# Patient Record
Sex: Female | Born: 1955
Health system: Southern US, Community
[De-identification: ages and names within clinical notes are randomized; demographics above are authoritative.]

## PROBLEM LIST (undated history)

## (undated) DIAGNOSIS — R112 Nausea with vomiting, unspecified: Secondary | ICD-10-CM

## (undated) DIAGNOSIS — N189 Chronic kidney disease, unspecified: Secondary | ICD-10-CM

## (undated) DIAGNOSIS — Z9889 Other specified postprocedural states: Secondary | ICD-10-CM

## (undated) DIAGNOSIS — T8859XA Other complications of anesthesia, initial encounter: Secondary | ICD-10-CM

## (undated) DIAGNOSIS — R42 Dizziness and giddiness: Secondary | ICD-10-CM

## (undated) DIAGNOSIS — H9191 Unspecified hearing loss, right ear: Secondary | ICD-10-CM

## (undated) DIAGNOSIS — E7800 Pure hypercholesterolemia, unspecified: Secondary | ICD-10-CM

## (undated) DIAGNOSIS — Z8489 Family history of other specified conditions: Secondary | ICD-10-CM

## (undated) DIAGNOSIS — Z974 Presence of external hearing-aid: Secondary | ICD-10-CM

## (undated) DIAGNOSIS — T4145XA Adverse effect of unspecified anesthetic, initial encounter: Secondary | ICD-10-CM

## (undated) DIAGNOSIS — Z5189 Encounter for other specified aftercare: Secondary | ICD-10-CM

## (undated) DIAGNOSIS — K219 Gastro-esophageal reflux disease without esophagitis: Secondary | ICD-10-CM

## (undated) DIAGNOSIS — IMO0001 Reserved for inherently not codable concepts without codable children: Secondary | ICD-10-CM

## (undated) HISTORY — PX: COSMETIC SURGERY: SHX468

## (undated) HISTORY — PX: TUBAL LIGATION: SHX77

## (undated) HISTORY — PX: NECK SURGERY: SHX720

---
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
## 1998-06-27 ENCOUNTER — Encounter: Admission: RE | Admit: 1998-06-27 | Discharge: 1998-09-25 | Payer: Self-pay | Admitting: *Deleted

## 1999-04-06 ENCOUNTER — Ambulatory Visit (HOSPITAL_COMMUNITY): Admission: RE | Admit: 1999-04-06 | Discharge: 1999-04-06 | Payer: Self-pay | Admitting: *Deleted

## 1999-05-02 ENCOUNTER — Other Ambulatory Visit: Admission: RE | Admit: 1999-05-02 | Discharge: 1999-05-02 | Payer: Self-pay | Admitting: Obstetrics and Gynecology

## 2000-04-02 ENCOUNTER — Ambulatory Visit (HOSPITAL_COMMUNITY): Admission: RE | Admit: 2000-04-02 | Discharge: 2000-04-02 | Payer: Self-pay | Admitting: Internal Medicine

## 2000-04-02 ENCOUNTER — Encounter: Payer: Self-pay | Admitting: Internal Medicine

## 2000-11-21 ENCOUNTER — Other Ambulatory Visit: Admission: RE | Admit: 2000-11-21 | Discharge: 2000-11-21 | Payer: Self-pay | Admitting: Obstetrics and Gynecology

## 2000-12-11 ENCOUNTER — Encounter (INDEPENDENT_AMBULATORY_CARE_PROVIDER_SITE_OTHER): Payer: Self-pay

## 2000-12-11 ENCOUNTER — Ambulatory Visit (HOSPITAL_COMMUNITY): Admission: RE | Admit: 2000-12-11 | Discharge: 2000-12-11 | Payer: Self-pay | Admitting: Obstetrics and Gynecology

## 2002-01-06 ENCOUNTER — Encounter (INDEPENDENT_AMBULATORY_CARE_PROVIDER_SITE_OTHER): Payer: Self-pay | Admitting: Specialist

## 2002-01-06 ENCOUNTER — Ambulatory Visit (HOSPITAL_COMMUNITY): Admission: RE | Admit: 2002-01-06 | Discharge: 2002-01-06 | Payer: Self-pay | Admitting: Obstetrics and Gynecology

## 2002-05-06 ENCOUNTER — Other Ambulatory Visit: Admission: RE | Admit: 2002-05-06 | Discharge: 2002-05-06 | Payer: Self-pay | Admitting: Obstetrics and Gynecology

## 2002-05-07 HISTORY — PX: CERVICAL DISC SURGERY: SHX588

## 2002-06-23 ENCOUNTER — Ambulatory Visit (HOSPITAL_COMMUNITY): Admission: RE | Admit: 2002-06-23 | Discharge: 2002-06-23 | Payer: Self-pay | Admitting: Internal Medicine

## 2002-06-23 ENCOUNTER — Encounter: Payer: Self-pay | Admitting: Internal Medicine

## 2002-07-14 ENCOUNTER — Encounter: Admission: RE | Admit: 2002-07-14 | Discharge: 2002-10-12 | Payer: Self-pay | Admitting: Internal Medicine

## 2003-05-24 ENCOUNTER — Other Ambulatory Visit: Admission: RE | Admit: 2003-05-24 | Discharge: 2003-05-24 | Payer: Self-pay | Admitting: Obstetrics and Gynecology

## 2003-09-10 ENCOUNTER — Ambulatory Visit (HOSPITAL_COMMUNITY): Admission: RE | Admit: 2003-09-10 | Discharge: 2003-09-10 | Payer: Self-pay | Admitting: Internal Medicine

## 2004-02-09 ENCOUNTER — Ambulatory Visit (HOSPITAL_COMMUNITY): Admission: RE | Admit: 2004-02-09 | Discharge: 2004-02-10 | Payer: Self-pay | Admitting: Neurosurgery

## 2004-07-26 ENCOUNTER — Other Ambulatory Visit: Admission: RE | Admit: 2004-07-26 | Discharge: 2004-07-26 | Payer: Self-pay | Admitting: Obstetrics and Gynecology

## 2005-03-20 ENCOUNTER — Ambulatory Visit (HOSPITAL_COMMUNITY): Admission: RE | Admit: 2005-03-20 | Discharge: 2005-03-20 | Payer: Self-pay | Admitting: Internal Medicine

## 2005-08-16 ENCOUNTER — Other Ambulatory Visit: Admission: RE | Admit: 2005-08-16 | Discharge: 2005-08-16 | Payer: Self-pay | Admitting: Obstetrics and Gynecology

## 2006-05-17 ENCOUNTER — Ambulatory Visit: Payer: Self-pay

## 2006-08-29 ENCOUNTER — Ambulatory Visit (HOSPITAL_COMMUNITY): Admission: RE | Admit: 2006-08-29 | Discharge: 2006-08-29 | Payer: Self-pay | Admitting: Obstetrics and Gynecology

## 2006-08-29 ENCOUNTER — Encounter (INDEPENDENT_AMBULATORY_CARE_PROVIDER_SITE_OTHER): Payer: Self-pay | Admitting: Specialist

## 2007-09-08 ENCOUNTER — Encounter: Admission: RE | Admit: 2007-09-08 | Discharge: 2007-12-07 | Payer: Self-pay | Admitting: Obstetrics and Gynecology

## 2007-10-16 ENCOUNTER — Ambulatory Visit (HOSPITAL_COMMUNITY): Admission: RE | Admit: 2007-10-16 | Discharge: 2007-10-16 | Payer: Self-pay | Admitting: Internal Medicine

## 2008-06-10 ENCOUNTER — Encounter: Admission: RE | Admit: 2008-06-10 | Discharge: 2008-06-10 | Payer: Self-pay | Admitting: Endocrinology

## 2009-01-14 ENCOUNTER — Encounter: Admission: RE | Admit: 2009-01-14 | Discharge: 2009-01-14 | Payer: Self-pay | Admitting: Endocrinology

## 2009-02-01 ENCOUNTER — Encounter (INDEPENDENT_AMBULATORY_CARE_PROVIDER_SITE_OTHER): Payer: Self-pay | Admitting: Interventional Radiology

## 2009-02-01 ENCOUNTER — Encounter: Admission: RE | Admit: 2009-02-01 | Discharge: 2009-02-01 | Payer: Self-pay | Admitting: Endocrinology

## 2009-02-01 ENCOUNTER — Other Ambulatory Visit: Admission: RE | Admit: 2009-02-01 | Discharge: 2009-02-01 | Payer: Self-pay | Admitting: Interventional Radiology

## 2009-11-04 ENCOUNTER — Ambulatory Visit (HOSPITAL_COMMUNITY): Admission: RE | Admit: 2009-11-04 | Discharge: 2009-11-04 | Payer: Self-pay | Admitting: Internal Medicine

## 2010-09-22 NOTE — Op Note (Signed)
NAME:  [name], [name]                            ACCOUNT NO.:  [id number]   MEDICAL RECORD NO.:  [id number]                   PATIENT TYPE:  AMB   LOCATION:  SDC                                  FACILITY:  [facility]   PHYSICIAN:  Miguel Aschoff, M.D.                    DATE OF BIRTH:  05-Jul-1955   DATE OF PROCEDURE:  01/06/2002  DATE OF DISCHARGE:                                 OPERATIVE REPORT   PREOPERATIVE DIAGNOSES:  Menorrhagia.   POSTOPERATIVE DIAGNOSES:  Menorrhagia.   PROCEDURE:  Dilatation and curettage, endometrial cryo ablation.   SURGEON:  Miguel Aschoff, M.D.   ANESTHESIA:  Paracervical block with IV sedation.   COMPLICATIONS:  None.   JUSTIFICATION:  The patient is a 55 year old white female with a history of  progressively heavier menstrual cycles.  The patient had undergone a prior  D&C in 2002 which revealed benign secretory endometrium, however, heavy  bleeding continued.  Attempts to control the bleeding using __________ were  unsuccessful.  She presents now to undergo D&C and cryo ablation in an  effort to resolve the menorrhagia.   PROCEDURE:  The patient was taken to the operating room, placed in the  supine position, and IV sedation was administered.  She was then placed in  the dorsal lithotomy position and prepped and draped in the usual sterile  fashion.  Examination under anesthesia revealed normal external genitalia,  normal Bartholin's and Skene's gland, normal urethra.  Vaginal vault was  without gross lesion.  The cervix was without gross lesion.  The cervix was  anterior, normal size and shape.  The adnexa revealed no masses.  The  bladder was then catheterized.  A speculum was placed in the vaginal vault.  Anterior cervical lip was grasped with a tenaculum and then a paracervical  block was placed using 18 cc of 1% Xylocaine.  Following this the  endocervical canal was dilated using 0 Pratt dilators and sounded to a depth  of 8 cm.  After the dilatation  vigorous curettage was carried out using a  medium-sized serrated curette and this tissue was sent for histologic study.  The cavity appeared to be smooth and regular.  Once this was done the cryo  ablation unit was precooled and then the cryo ablation tip was placed into  the left cornual portion of the uterus to a depth of 8 cm and then cryo  ablation was carried out in this area for six minutes without difficulty.  After a thaw phase an identical procedure was carried out on the right  cornual portion of the uterus for six minutes.  Following the completion of  this portion of the procedure, cavity thawed, and the instruments were  removed without difficulty.  The patient was reversed from the anesthetic  and taken to the recovery room in satisfactory condition.  The estimated  blood loss  was estimated to about 20 cc.   Plan is for the patient to be discharged home.  Medications for home include  Floxin 300 mg b.i.d. for three days and Darvocet-N 100 one q.4h. as needed  for pain.  She is instructed to place nothing in the vagina for two weeks,  to call for any problems such as fever, pain, and heavy bleeding.  She is to  be seen back in four weeks for a follow-up examination.  Pathology report is  expected in 48 hours.  The patient is to be discharged home.                                               Miguel Aschoff, M.D.    AR/MEDQ  D:  01/06/2002  T:  01/06/2002  Job:  [id number]

## 2010-09-22 NOTE — Op Note (Signed)
NAMECLOMA, RAHRIG NO.:  [id number]   MEDICAL RECORD NO.:  [id number]          PATIENT TYPE:  OIB   LOCATION:  3004                         FACILITY:  [facility]   PHYSICIAN:  Coletta Memos, M.D.     DATE OF BIRTH:  1956/01/16   DATE OF PROCEDURE:  02/09/2004  DATE OF DISCHARGE:                                 OPERATIVE REPORT   PREOPERATIVE DIAGNOSIS:  1.  Displaced cervical disc C5-C6.  2.  Right C6 radiculopathy.   POSTOPERATIVE DIAGNOSIS:  1.  Displaced cervical disc C5-C6.  2.  Right C6 radiculopathy.   PROCEDURE:  Anterior cervical decompression C5-C6, anterior arthrodesis C5  to C6 with 8 mm Synthes graft, anterior plating using Vectra plate.   COMPLICATIONS:  None.   SURGEON:  Coletta Memos, M.D.   ASSISTANT:  Clydene Fake, M.D.   ANESTHESIA:  General endotracheal anesthesia.   INDICATIONS FOR PROCEDURE:  Mora Pedraza is a 55 year old who presented with  significant pain in the left upper extremity which she has had since May  2005.  She has had three rounds of steroid injections and physical therapy,  none with relief.  MRI showed a herniated disc.  I, therefore, recommended  and she agreed to undergo operative decompression.   OPERATIVE NOTE:  Ms. Pavlovich was brought to the operating room, intubated, and  placed under general anesthesia without difficulty.  She was positioned with  her head on a horseshoe headrest in neutral position with 10 pounds applied  via chin strap.  Her neck was prepped and she was draped in a sterile  fashion.  I infiltrated 4 mL of 0.5% lidocaine with 1:200,000 epinephrine.  I opened the skin with a #10 blade, taking it from the midline to the medial  border of the left sternocleidomastoid muscle just at the thyroid cartilage.  I then dissected above the platysma rostrally and caudally.  I opened the  platysma in a horizontal fashion.  I then dissected rostrally and caudally  inferior to the platysma.  I created an  avascular plane to the anterior  cervical spine.  I placed a spinal needle into the disc space and I was at  C5-C6.  I then opened the disc with a #10 blade and used a curet to remove  disc material.  I placed distraction pins, one at C5 and one at C6,  distracted the disc space, reflected the longus colli muscles, and placed a  Caspar retractor.  Using curets, pituitary rongeurs, and a high speed drill,  I removed the osteophytes and disc material.  I then decompressed the spinal  canal until both the right and left C6 nerve roots were well decompressed.  I irrigated the wound.  I then placed an 8 mm graft into the intravertebral  space.  I removed the distraction pins.  The weight was taken off the neck.  I then, with Dr. Doreen Beam assistance, placed the plate and two  screws into C5 and two screws into C6.  X-ray showed the plate, screws, and  plug to be in good position.  I irrigated the wound once more.  I then  closed the wound in layered fashion using Vicryl sutures.  Dermabond was  used for a sterile dressing.       KC/MEDQ  D:  02/09/2004  T:  02/09/2004  Job:  [id number]

## 2010-09-22 NOTE — Op Note (Signed)
[name], [name] NO.:  [id number]   MEDICAL RECORD NO.:  [id number]          PATIENT TYPE:  AMB   LOCATION:  SDC                           [facility]ACILITY:  [facility]   PHYSICIAN:  Miguel Aschoff, M.D.       DATE OF BIRTH:  Oct 06, 1955   DATE OF PROCEDURE:  DATE OF DISCHARGE:                               OPERATIVE REPORT   PREOPERATIVE DIAGNOSIS:  Irregular vaginal bleeding.   POSTOPERATIVE DIAGNOSIS:  Irregular vaginal bleeding.   PROCEDURES:  Cervical dilatation and hysteroscopy, uterine curettage.   SURGEON:  Dr. Miguel Aschoff.   ANESTHESIA:  General.   COMPLICATIONS:  None.   JUSTIFICATION:  The patient is a 55 year old white female who had an  endometrial cryoablation in 2003.  The patient has developed the onset  of heavy irregular vaginal bleeding and she presents now to undergo  evaluation via hysteroscopy and D&C to see if the etiology for this  bleeding can be established and corrected.  The risks and benefits of  the procedure were discussed with the patient.   PROCEDURE:  The patient was taken to the operating room and placed in  the supine position and general anesthesia was administered without  difficulty.  She was then placed in the dorsolithotomy position, and  prepped and draped in the usual sterile fashion.  Examination under  anesthesia revealed normal external genitalia and normal Bartholin's and  Skene's glands, normal urethra.  The vaginal vault was without gross  lesion.  The cervix was without gross lesion.  The uterus was noted to  be anterior and normal size and shape.  The adnexa revealed no masses.  At this point, a speculum was placed in the vaginal vault.  Anterior  cervical lip was grasped with a tenaculum and then dilated using serial  Pratt dilators.  The cavity was sounded to 8 cm after a number 23 Pratt  dilator was passed.  The diagnostic hysteroscope was advanced through  the endocervical canal.  No endocervical lesions were noted.   Evaluation  of endometrial cavity appeared to show regeneration at the endometrium.  There were no lesions, however, noted in the cavity.  There was no  evidence of polyps or submucous myomas.  At this point, the hysteroscope  was removed.  The cervix was dilated until a #25 Pratt dilator could be  passed and then using a small serrated curette, the endometrial cavity  was vigorously curetted and the tissue sent for histologic study.  At  this point, the tenaculum was removed.  The cervix was injected with 18  mL of 1% Xylocaine by placing equal amounts at the 12, 4 and 8 o'clock  positions.  After this was done, the patient was reversed from the  anesthetic and brought to the recovery room in satisfactory condition.  The estimated blood loss was less than 30 mL.   Plan is for the patient to be discharged home.  Medications for home  include Darvocet N-100 one every 4 hours need for pain, doxycycline 100  mg twice a day x3 days.  The patient  is to call for pathology report on  September 04, 2006.  She is to call for any problems such as fever, pain or  heavy bleeding.      Miguel Aschoff, M.D.  Electronically Signed     AR/MEDQ  D:  08/29/2006  T:  08/29/2006  Job:  [id number]

## 2010-09-22 NOTE — Op Note (Signed)
[facility]  Patient:    [name], [name]                         MRN: [id number] Proc. Date: 12/11/00 Adm. Date:  [id number] Attending:  Miguel Aschoff                           Operative Report  PREOPERATIVE DIAGNOSIS:       Menorrhagia.  POSTOPERATIVE DIAGNOSIS:      Menorrhagia.  PROCEDURE:                    Diagnostic hysteroscopy with uterine curettage.  SURGEON:                      Miguel Aschoff, M.D.  ANESTHESIA:                   IV sedation with paracervical block.  COMPLICATIONS:                None.  JUSTIFICATION:                The patient is a 55 year old white female with a history of heavy menstrual periods not responsive to outpatient medical therapy.  She presents now to undergo hysteroscopy to see if an etiology for the heavy bleeding can be established and corrected.  The risks and benefits of the procedure have been discussed with the patient.  DESCRIPTION OF PROCEDURE:     The patient was taken to the operating room, placed in the supine position and IV sedation was administered without difficulty.  She was then placed in the dorsal lithotomy position, prepped and draped in the usual sterile fashion.  The bladder was catheterized.  A speculum was then placed in the vaginal vault.  Prior ______ examination revealed the uterus to be anterior, normal size and shape.  The adnexa revealed no masses.  After placing the speculum, the cervix was injected with 16 cc of 1% Xylocaine by placing approximately 5.5 cc at the 12, 4 and 6 oclock positions on the cervix.  Following this, the endocervical canal was dilated using serial Pratt dilators until a #27 Pratt dilator had been passed. The diagnostic hysteroscope was then advanced to the cervix.  No lesions were noted in the endocervix.  On entering the endometrial cavity, there was no evidence of any polyps or submucous myomas.  The cavity appeared smooth and regular.  With no obvious  pathology being noted, the hysteroscope was removed and vigorous curettage was carried out, first with a medium-sized sharp curet and then with a small serrated curet.  Tissue was sent for histologic study. At this point, all instruments were removed.  Hemostasis was readily achieved and the procedure was completed.  Plans were for the patient to be discharged to home.  Medications for home include doxycycline 100 mg b.i.d. x 3 days and Darvocet-N 100 p.r.n. cramping. he is to call for any problems such as fever, pain or heavy bleeding, and is to call for the pathology report on December 13, 2000.  She will be seen back for follow up in four weeks.  CONDITION ON DISCHARGE:       Satisfactory. DD:  12/11/00 TD:  12/11/00 Job: [id number]

## 2010-10-05 ENCOUNTER — Other Ambulatory Visit: Payer: Self-pay | Admitting: Obstetrics and Gynecology

## 2010-11-30 ENCOUNTER — Encounter (HOSPITAL_COMMUNITY)
Admission: RE | Admit: 2010-11-30 | Discharge: 2010-11-30 | Disposition: A | Discharge status: Home / Self Care | Payer: 59 | Source: Ambulatory Visit | Attending: Obstetrics and Gynecology | Admitting: Obstetrics and Gynecology

## 2010-11-30 ENCOUNTER — Encounter (HOSPITAL_COMMUNITY): Payer: Self-pay

## 2010-11-30 HISTORY — DX: Gastro-esophageal reflux disease without esophagitis: K21.9

## 2010-11-30 HISTORY — DX: Adverse effect of unspecified anesthetic, initial encounter: T41.45XA

## 2010-11-30 HISTORY — DX: Other complications of anesthesia, initial encounter: T88.59XA

## 2010-11-30 HISTORY — DX: Chronic kidney disease, unspecified: N18.9

## 2010-11-30 HISTORY — DX: Encounter for other specified aftercare: Z51.89

## 2010-11-30 HISTORY — DX: Reserved for inherently not codable concepts without codable children: IMO0001

## 2010-11-30 LAB — CBC
MCHC: 33.6 g/dL (ref 30.0–36.0)
MCV: 90.3 fL (ref 78.0–100.0)
Platelets: 313 10*3/uL (ref 150–400)
RBC: 4.72 MIL/uL (ref 3.87–5.11)
RDW: 13.1 % (ref 11.5–15.5)

## 2010-11-30 LAB — URINALYSIS, ROUTINE W REFLEX MICROSCOPIC
Glucose, UA: NEGATIVE mg/dL
Ketones, ur: NEGATIVE mg/dL
Leukocytes, UA: NEGATIVE
pH: 6 (ref 5.0–8.0)

## 2010-11-30 LAB — BASIC METABOLIC PANEL
BUN: 15 mg/dL (ref 6–23)
Calcium: 11 mg/dL — ABNORMAL HIGH (ref 8.4–10.5)
Chloride: 97 mEq/L (ref 96–112)
Creatinine, Ser: 0.79 mg/dL (ref 0.50–1.10)
GFR calc Af Amer: >60 mL/min (ref >60)
GFR calc non Af Amer: >60 mL/min (ref >60)
Glucose, Bld: 134 mg/dL — ABNORMAL HIGH (ref 70–99)
Sodium: 134 mEq/L — ABNORMAL LOW (ref 135–145)

## 2010-11-30 NOTE — Patient Instructions (Addendum)
20 [name]  11/30/2010   Your procedure is scheduled on:  Dec 08 2010  Report to [facility] at 1030AM.  Call this number if you have problems the morning of surgery: [phone]   Remember:   Do not eat food:After Midnight.  Do not drink clear liquids: After Midnight.  Take these medicines the morning of surgery with A SIP OF WATER: NA   Do not wear jewelry, make-up or nail polish.  Do not bring valuables to the hospital.  Contacts, dentures or bridgework may not be worn into surgery.  Leave suitcase in the car. After surgery it may be brought to your room.  For patients admitted to the hospital, checkout time is 11:00 AM the day of discharge.   Patients discharged the day of surgery will not be allowed to drive home.  Name and phone number of your driver: Reannah Totten    [phone]  Special Instructions: CHG wash per instruction sheet.   Please read over the following fact sheets that you were given: NA

## 2010-12-06 HISTORY — PX: OTHER SURGICAL HISTORY: SHX169

## 2010-12-07 MED ORDER — CIPROFLOXACIN IN D5W 400 MG/200ML IV SOLN
400.0000 mg | Freq: Two times a day (BID) | INTRAVENOUS | Status: DC
  Filled 2010-12-07 (×3): qty 200

## 2010-12-07 MED ORDER — CIPROFLOXACIN IN D5W 400 MG/200ML IV SOLN
400.0000 mg | INTRAVENOUS | Status: DC
  Filled 2010-12-07: qty 200

## 2010-12-08 ENCOUNTER — Encounter (HOSPITAL_COMMUNITY)
Admission: RE | Disposition: A | Discharge status: Home / Self Care | Payer: Self-pay | Source: Ambulatory Visit | Attending: Obstetrics and Gynecology

## 2010-12-08 ENCOUNTER — Encounter (HOSPITAL_COMMUNITY): Payer: Self-pay | Admitting: Anesthesiology

## 2010-12-08 ENCOUNTER — Ambulatory Visit (HOSPITAL_COMMUNITY)
Admission: RE | Admit: 2010-12-08 | Discharge: 2010-12-08 | Disposition: A | Discharge status: Home / Self Care | Payer: 59 | Source: Ambulatory Visit | Attending: Obstetrics and Gynecology | Admitting: Obstetrics and Gynecology

## 2010-12-08 DIAGNOSIS — Z01818 Encounter for other preprocedural examination: Secondary | ICD-10-CM | POA: Insufficient documentation

## 2010-12-08 DIAGNOSIS — E119 Type 2 diabetes mellitus without complications: Secondary | ICD-10-CM | POA: Insufficient documentation

## 2010-12-08 DIAGNOSIS — Z01812 Encounter for preprocedural laboratory examination: Secondary | ICD-10-CM | POA: Insufficient documentation

## 2010-12-08 DIAGNOSIS — N393 Stress incontinence (female) (male): Secondary | ICD-10-CM | POA: Insufficient documentation

## 2010-12-08 HISTORY — PX: BLADDER SUSPENSION: SHX72

## 2010-12-08 LAB — GLUCOSE, CAPILLARY: Glucose-Capillary: 142 mg/dL — ABNORMAL HIGH (ref 70–99)

## 2010-12-08 SURGERY — URETHROPEXY, USING TRANSVAGINAL TAPE
Anesthesia: General | Site: Vagina

## 2010-12-08 MED ORDER — STERILE WATER FOR IRRIGATION IR SOLN
Status: DC | PRN
  Administered 2010-12-08: 200 mL

## 2010-12-08 MED ORDER — PROPOFOL 10 MG/ML IV EMUL
INTRAVENOUS | Status: DC | PRN
  Administered 2010-12-08: 170 mg via INTRAVENOUS

## 2010-12-08 MED ORDER — PROMETHAZINE HCL 25 MG/ML IJ SOLN: 6.2500 mg | INTRAMUSCULAR | Status: DC | PRN

## 2010-12-08 MED ORDER — FENTANYL CITRATE 0.05 MG/ML IJ SOLN
25.0000 ug | INTRAMUSCULAR | Status: DC | PRN
  Administered 2010-12-08: 50 ug via INTRAVENOUS

## 2010-12-08 MED ORDER — ROCURONIUM BROMIDE 100 MG/10ML IV SOLN
INTRAVENOUS | Status: DC | PRN
  Administered 2010-12-08: 30 mg via INTRAVENOUS

## 2010-12-08 MED ORDER — KETOROLAC TROMETHAMINE 30 MG/ML IJ SOLN
INTRAMUSCULAR | Status: DC | PRN
  Administered 2010-12-08: 15 mg via INTRAVENOUS

## 2010-12-08 MED ORDER — MIDAZOLAM HCL 5 MG/5ML IJ SOLN
INTRAMUSCULAR | Status: DC | PRN
  Administered 2010-12-08: 2 mg via INTRAVENOUS

## 2010-12-08 MED ORDER — LACTATED RINGERS IV SOLN
INTRAVENOUS | Status: DC
  Administered 2010-12-08 (×4): via INTRAVENOUS

## 2010-12-08 MED ORDER — LIDOCAINE-EPINEPHRINE 1 %-1:100000 IJ SOLN
INTRAMUSCULAR | Status: DC | PRN
  Administered 2010-12-08: 6 mL

## 2010-12-08 MED ORDER — FENTANYL CITRATE 0.05 MG/ML IJ SOLN
INTRAMUSCULAR | Status: DC | PRN
  Administered 2010-12-08 (×5): 50 ug via INTRAVENOUS

## 2010-12-08 MED ORDER — NEOSTIGMINE METHYLSULFATE 1 MG/ML IJ SOLN
INTRAMUSCULAR | Status: DC | PRN
  Administered 2010-12-08: 3 mg via INTRAMUSCULAR

## 2010-12-08 MED ORDER — DEXAMETHASONE SODIUM PHOSPHATE 4 MG/ML IJ SOLN: INTRAMUSCULAR | Status: DC | PRN

## 2010-12-08 MED ORDER — GELATIN ABSORBABLE 12-7 MM EX MISC
CUTANEOUS | Status: DC | PRN
  Administered 2010-12-08: 1

## 2010-12-08 MED ORDER — INDIGOTINDISULFONATE SODIUM 8 MG/ML IJ SOLN
INTRAMUSCULAR | Status: DC | PRN
  Administered 2010-12-08: 5 mL via INTRAVENOUS

## 2010-12-08 MED ORDER — CIPROFLOXACIN IN D5W 400 MG/200ML IV SOLN
INTRAVENOUS | Status: DC | PRN
  Administered 2010-12-08: 400 mg via INTRAVENOUS

## 2010-12-08 MED ORDER — ATROPINE SULFATE 0.4 MG/ML IJ SOLN
INTRAMUSCULAR | Status: DC | PRN
  Administered 2010-12-08: .3 mg via INTRAVENOUS

## 2010-12-08 MED ORDER — KETOROLAC TROMETHAMINE 30 MG/ML IJ SOLN: 15.0000 mg | Freq: Once | INTRAMUSCULAR | Status: DC | PRN

## 2010-12-08 MED ORDER — ACETAMINOPHEN 325 MG PO TABS: 325.0000 mg | ORAL_TABLET | ORAL | Status: DC | PRN

## 2010-12-08 MED ORDER — LIDOCAINE HCL (CARDIAC) 20 MG/ML IV SOLN
INTRAVENOUS | Status: DC | PRN
  Administered 2010-12-08: 70 mg via INTRAVENOUS
  Administered 2010-12-08: 30 mg via INTRAVENOUS

## 2010-12-08 MED ORDER — ONDANSETRON HCL 4 MG/2ML IJ SOLN
INTRAMUSCULAR | Status: DC | PRN
  Administered 2010-12-08: 4 mg via INTRAVENOUS

## 2010-12-08 SURGICAL SUPPLY — 37 items
ADH SKN CLS APL DERMABOND .7 (GAUZE/BANDAGES/DRESSINGS) ×1
BLADE SURG 15 STRL LF C SS BP (BLADE) ×1 IMPLANT
BLADE SURG 15 STRL SS (BLADE) ×2
BLADE SURG CLIPPER 3M 9600 (MISCELLANEOUS) ×1 IMPLANT
CANISTER SUCTION 2500CC (MISCELLANEOUS) ×2 IMPLANT
CLOTH BEACON ORANGE TIMEOUT ST (SAFETY) ×2 IMPLANT
DECANTER SPIKE VIAL GLASS SM (MISCELLANEOUS) IMPLANT
DERMABOND ADVANCED (GAUZE/BANDAGES/DRESSINGS) ×1
DERMABOND ADVANCED .7 DNX12 (GAUZE/BANDAGES/DRESSINGS) IMPLANT
DRAPE CAMERA CLOSED 9X96 (DRAPES) ×2 IMPLANT
DRAPE HYSTEROSCOPY (DRAPE) ×2 IMPLANT
DRAPE UTILITY XL STRL (DRAPES) ×2 IMPLANT
GAUZE PACKING 2X5 YD STERILE (GAUZE/BANDAGES/DRESSINGS) ×2 IMPLANT
GENERIC IMPLANT FOR PICIS (MISCELLANEOUS) ×2 IMPLANT
GLOVE BIO SURGEON STRL SZ 6.5 (GLOVE) ×4 IMPLANT
GLOVE BIO SURGEON STRL SZ8 (GLOVE) IMPLANT
GOWN PREVENTION PLUS LG XLONG (DISPOSABLE) ×8 IMPLANT
NDL MAYO 6 CRC TAPER PT (NEEDLE) IMPLANT
NEEDLE HYPO 22GX1.5 SAFETY (NEEDLE) ×2 IMPLANT
NEEDLE MAYO 6 CRC TAPER PT (NEEDLE) IMPLANT
NS IRRIG 1000ML POUR BTL (IV SOLUTION) ×2 IMPLANT
PACK VAGINAL WOMENS (CUSTOM PROCEDURE TRAY) ×2 IMPLANT
PLUG CATH AND CAP STER (CATHETERS) ×2 IMPLANT
SET CYSTO W/LG BORE CLAMP LF (SET/KITS/TRAYS/PACK) ×2 IMPLANT
SLING PERIGEE INTEXEN MESH (MISCELLANEOUS) IMPLANT
SLING TVT EXACT (Sling) ×1 IMPLANT
SURGIFLO TRUKIT (HEMOSTASIS) IMPLANT
SUT VIC AB 0 CT1 27 (SUTURE) ×4
SUT VIC AB 0 CT1 27XBRD ANBCTR (SUTURE) ×2 IMPLANT
SUT VIC AB 2-0 CT2 27 (SUTURE) IMPLANT
SUT VIC AB 2-0 SH 27 (SUTURE) ×6
SUT VIC AB 2-0 SH 27XBRD (SUTURE) ×3 IMPLANT
SUT VIC AB 2-0 UR6 27 (SUTURE) ×1 IMPLANT
TOWEL OR 17X24 6PK STRL BLUE (TOWEL DISPOSABLE) ×4 IMPLANT
TRAY FOLEY CATH 16FR SILVER (SET/KITS/TRAYS/PACK) ×2 IMPLANT
TUBING CONNECTING 10 (TUBING) ×2 IMPLANT
WATER STERILE IRR 1000ML POUR (IV SOLUTION) ×2 IMPLANT

## 2010-12-08 NOTE — Preoperative (Signed)
Beta Blockers   Reason not to administer Beta Blockers:Not Applicable  Antibiotic ordered by surgeon  And given within 30 minutes of start of surgery

## 2010-12-08 NOTE — Op Note (Signed)
[name], [name] NO.:  [id number]  MEDICAL RECORD NO.:  [id number]  LOCATION:  [facility]                          FACILITY:  [facility]  PHYSICIAN:  Randye Lobo, M.D.   DATE OF BIRTH:  Jun 06, 1955  DATE OF PROCEDURE:  12/08/2010 DATE OF DISCHARGE:  12/08/2010                              OPERATIVE REPORT   PREOPERATIVE DIAGNOSIS:  Genuine stress incontinence.  POSTOPERATIVE DIAGNOSIS:  Genuine stress incontinence.  PROCEDURE:  TVT Exact midurethral sling with cystoscopy.  SURGEON:  Randye Lobo, MD  ASSISTANT:  Miguel Aschoff, MD  ANESTHESIA:  General endotracheal, local with 1% lidocaine with epinephrine 1:100,000.  INTRAVENOUS FLUIDS:  500 mL  ESTIMATED BLOOD LOSS:  30 mL.  URINE OUTPUT:  300 mL.  COMPLICATIONS:  None.  INDICATIONS FOR PROCEDURE:  The patient is a 55 year old female who presented to her primary gynecologist, Dr. Miguel Aschoff reporting leakage of urine with coughing, laughing, and exercise.  The patient requested surgical treatment.  She had multichannel urodynamic testing in the office, which documented genuine stress incontinence.  A plan is now made to proceed with a midurethral sling and cystoscopy after risks, benefits, and alternatives have been reviewed.  FINDINGS:  Examination under anesthesia revealed a very minimal cystocele.  Cystoscopy demonstrated the bladder to be normal throughout 360 degrees including the bladder dome and trigone.  There is no evidence of foreign body in the bladder or the urethra.  SPECIMENS:  None.  PROCEDURE:  The patient was reidentified in the preoperative hold area. She received ciprofloxacin IV for antibiotic prophylaxis and she received both TED hose and PAS stockings for DVT prophylaxis.  In the operating room, general endotracheal anesthesia was induced and the patient was placed in the dorsal lithotomy position.  The patient's lower abdomen, vagina, and perineum were then sterilely prepped  and draped.  The patient was catheterized of urine with an indwelling Foley catheter.  The procedure began by placing a weighted speculum into the vagina and then an Allis clamp over a 4-cm length along the anterior vaginal Kinnett in the midline beginning 1 cm below the urethral meatus.  The mucosa was injected locally with 1% lidocaine with epinephrine 1:100,000.  The vaginal mucosa was incised vertically with a scalpel.  A combination of sharp and blunt dissection was used to dissect back to the pubic rami bilaterally.  The 5-mm incisions were then created 2 cm to the right and left of the midline of the pubic symphysis.  The Foley catheter was removed and a rigid catheter guide was placed inside the urethra and deflected properly.  The sling was then placed using a bottom-up technique, beginning first on the patient's right-hand side.  The needle guide was brought through the endopelvic fascia and into the space of Retzius and then out through the right suprapubic incision.  The urethra was then deflected in the opposite direction and the same was performed on the patient's left-hand side.  Cystoscopy was performed and the findings were as noted above.  The bladder was then drained of all cystoscopic fluid.  The Foley catheter was replaced.  The sling was drawn up through the suprapubic incisions.  The positioning was checked and found to be excellent.  The plastic sheaths were then removed while a Kelly clamp was placed between the urethra and the sling itself.  Excess sling was trimmed suprapubically.  The vaginal mucosa was trimmed bilaterally using a Metzenbaum scissors. There was a small amount of bleeding noted from the exit site of the sling on the patient's left-hand side.  This responded to monopolar cautery and Gelfoam.  Hemostasis was adequate at this time and the vaginal incision was therefore closed with a running locked suture of 2- 0 Vicryl.  The suprapubic  incisions were closed with Dermabond.  The patient's Foley catheter was taken out.  The patient was escorted to the recovery room in stable and awake condition.  There were no complications to the procedure.  All needle, instrument, and sponge counts were correct.     Randye Lobo, M.D.     BES/MEDQ  D:  12/08/2010  T:  12/08/2010  Job:  [id number]

## 2010-12-08 NOTE — Progress Notes (Signed)
Blood glucose 142 on arrival to PACU

## 2010-12-08 NOTE — H&P (Signed)
NAME:  [name] [name]                       ACCOUNT NO.:  MEDICAL RECORD NO.:  LOCATION:                                 FACILITY:  PHYSICIAN:  Randye Lobo, M.D.   DATE OF BIRTH:  Sep 04, 1955  DATE OF ADMISSION: DATE OF DISCHARGE:                             HISTORY & PHYSICAL   CHIEF COMPLAINT:  Urinary incontinence.  HISTORY OF PRESENT ILLNESS:  The patient is a 55 year old gravida 2, para 2, Caucasian female, status post bilateral tubal ligation, who presents with leakage of urine with coughing, sneezing, laughing, and exercise, which has been progressive over the last year.  The patient also reports a history of urgency incontinence.  The patient is requesting surgical treatment of her incontinence.  She has gone through multichannel urodynamic testing, which confirms the presence of genuine stress incontinence with a leak point pressure of 135 cm of water. There was no evidence of detrusor instability during the examination.  PAST OBSTETRIC AND GYNECOLOGIC HISTORY:  Status post vaginal delivery x2, status post bilateral tubal ligation.  The patient is currently CombiPatch.  Her last Pap smear was May 2012 and was within normal limits.  Her last mammogram was March 2012 and was within normal limits. Status post uterine cryoablation.  PAST MEDICAL HISTORY: 1. Diabetes mellitus.  The patient is controlled with Janumet and a     new medication of which she does not remember the name.  She     reports her hemoglobin A1c was 7.1 two months ago and she has since     switched to a new medication for better control. 2. Tobacco use.  The patient smokes one half packet of cigarettes per     day. 3. History of nephrolithiasis.  The patient has not had any issues of     recurrent for the last 4-5 years. 4. Rare urinary tract infection. 5. Hyperlipidemia. 6. Gastroesophageal reflux disease.  PAST SURGICAL HISTORY: 1. Status post bilateral tubal ligation. 2. Status post  cryoablation of the uterus. 3. Status post neck surgery.  MEDICATIONS:  Zocor, Protonix, Janumet, diabetes medication of unknown name, CombiPatch 0.05/0.25.  ALLERGIES:  SULFA.  SOCIAL HISTORY:  The patient has been married for 31 years.  She is a Futures trader.  She smokes one half to one pack of cigarettes per day.  She denies use of alcohol or illicit drugs.  The patient consumes a large amount of crystal light during the day.  REVIEW OF SYSTEMS:  The patient reports the need to occasionally do digital manipulation to have a bowel movement.  She reports that this is an issue, which she is taking iron.  PHYSICAL EXAM:  VITAL SIGNS:  Height 5 feet and 5-1/2 inches, weight 180 pounds, blood pressure 100/70. LUNGS:  Clear to auscultation bilaterally. HEART:  S1-S2 with a regular rate and rhythm. ABDOMEN:  Soft and nontender without evidence of hepatosplenomegaly or organomegaly. PELVIC:  Normal external genitalia and urethra.  The vagina and cervix demonstrate no lesions.  There is a cystocele less than first degree. There is no evidence of any cervical or uterine prolapse.  There is a very minimal loss of  support of the posterior vaginal Fager.  The uterus is small and nontender.  There are no adnexal masses, no tenderness.  PREOPERATIVE LABS:  The patient had a hemoglobin A1c, which was performed November 24, 2010, which equals 7.2%.  IMPRESSION:  The patient is a 55 year old gravida 2, para 2 female with urodynamically proven genuine stress incontinence.  PLAN:  The patient will undergo a TVT mid urethral sling with cystoscopy at the [facility] on December 08, 2010.  Risks, benefits, and alternatives have been discussed with the patient, who wishes to proceed.     Randye Lobo, M.D.     BES/MEDQ  D:  12/08/2010  T:  12/08/2010  Job:  [id number]

## 2010-12-08 NOTE — Addendum Note (Signed)
Addendum  created 12/08/10 1505 by Erskine Speed, CRNA   Modules edited:Charges VN

## 2010-12-08 NOTE — Anesthesia Procedure Notes (Addendum)
Procedure Name: Intubation Date/Time: 12/08/2010 12:07 PM Performed by: Suella Grove Pre-anesthesia Checklist: Patient identified, Patient being monitored, Timeout performed, Emergency Drugs available and Suction available Patient Re-evaluated:Patient Re-evaluated prior to inductionOxygen Delivery Method: Circle System Utilized Preoxygenation: Pre-oxygenation with 100% oxygen Intubation Type: IV induction Ventilation: Mask ventilation without difficulty Laryngoscope Size: Mac and 3 Grade View: Grade II Tube size: 7.0 mm Number of attempts: 1 Placement Confirmation: ETT inserted through vocal cords under direct vision,  breath sounds checked- equal and bilateral and positive ETCO2 Secured at: 21 cm Tube secured with: Tape Dental Injury: Teeth and Oropharynx as per pre-operative assessment

## 2010-12-08 NOTE — Transfer of Care (Signed)
Immediate Anesthesia Transfer of Care Note  Patient: [name]  Procedure(s) Performed:  TRANSVAGINAL TAPE (TVT) PROCEDURE - transvaginal tape with cystoscopy  Patient Location: PACU  Anesthesia Type: General  Level of Consciousness: awake, sedated, patient cooperative and responds to stimulation  Airway & Oxygen Therapy: Patient Spontanous Breathing and Patient connected to nasal cannula oxygen  Post-op Assessment: Report given to PACU RN, Post -op Vital signs reviewed and stable and Patient moving all extremities  Post vital signs: Reviewed and stable  Complications: No apparent anesthesia complications

## 2010-12-08 NOTE — Anesthesia Postprocedure Evaluation (Signed)
  Anesthesia Post-op Note  Patient: [name]  Procedure(s) Performed:  TRANSVAGINAL TAPE (TVT) PROCEDURE - transvaginal tape with cystoscopy  Anesthesia Post Note  Patient: [name]  Procedure(s) Performed:  TRANSVAGINAL TAPE (TVT) PROCEDURE - transvaginal tape with cystoscopy  Anesthesia type: General  Patient location: PACU  Post pain: Pain level controlled  Post assessment: Post-op Vital signs reviewed, Patient's Cardiovascular Status Stable, Respiratory Function Stable, Patent Airway and Pain level controlled  Last Vitals:  Filed Vitals:   12/08/10 1345  BP: 119/58  Pulse: 79  Temp:   Resp: 12    Post vital signs: Reviewed and stable  Level of consciousness: awake, alert  and oriented  Complications: No apparent anesthesia complications

## 2010-12-08 NOTE — Anesthesia Preprocedure Evaluation (Addendum)
Anesthesia Evaluation  Name, MR# and DOB Patient awake  General Assessment Comment  Reviewed: Allergy & Precautions, H&P , Patient's Chart, lab work & pertinent test results and reviewed documented beta blocker date and time   History of Anesthesia Complications (+) AWARENESS UNDER ANESTHESIA  Airway Mallampati: II TM Distance: >3 FB Neck ROM: full    Dental No notable dental hx    Pulmonaryneg pulmonary ROS    clear to auscultation  pulmonary exam normal   Cardiovascular Exercise Tolerance: Good hypertensionregular Normal   Neuro/PsychNegative Neurological ROS Negative Psych ROS  GI/Hepatic/Renal negative GI ROS, negative Liver ROS, and negative Renal ROS (+)  GERD Controlled     Endo/Other  Negative Endocrine ROS (+) Diabetes mellitus-  Abdominal   Musculoskeletal  Hematology negative hematology ROS (+)   Peds  Reproductive/Obstetrics negative OB ROS   Anesthesia Other Findings            smoker Anesthesia Physical Anesthesia Plan  ASA: III  Anesthesia Plan: General   Post-op Pain Management:    Induction:   Airway Management Planned:   Additional Equipment:   Intra-op Plan:   Post-operative Plan:   Informed Consent: I have reviewed the patients History and Physical, chart, labs and discussed the procedure including the risks, benefits and alternatives for the proposed anesthesia with the patient or authorized representative who has indicated his/her understanding and acceptance.   Dental Advisory Given  Plan Discussed with: CRNA and Surgeon  Anesthesia Plan Comments:         Anesthesia Quick Evaluation

## 2010-12-08 NOTE — Brief Op Note (Signed)
12/08/2010  1:27 PM  PATIENT:  [name]  55 y.o. female  PRE-OPERATIVE DIAGNOSIS:  stress urinary incontinence  POST-OPERATIVE DIAGNOSIS:  stress urinary incontinence  PROCEDURE:  Procedure(s): TRANSVAGINAL TAPE (TVT) PROCEDURE  SURGEON:  Surgeon(s): Brook A Mervin Kung  PHYSICIAN ASSISTANT:   ASSISTANTS: Duane Lope, M.D.   ANESTHESIA:   general,local with lidocaine 1% with epinephrine 1:100,000  ESTIMATED BLOOD LOSS: 30cc.  BLOOD ADMINISTERED:none  DRAINS: none   LOCAL MEDICATIONS USED:  LIDOCAINE CC  SPECIMEN:  No Specimen  DISPOSITION OF SPECIMEN:  N/A  COUNTS:  YES  TOURNIQUET:  * No tourniquets in log *  DICTATION #: NA  PLAN OF CARE: NA  PATIENT DISPOSITION:  PACU - hemodynamically stable.   Delay start of Pharmacological VTE agent (>24hrs) due to surgical blood loss or risk of bleeding:  not applicable

## 2010-12-08 NOTE — Progress Notes (Signed)
Brief Pre-op Update Note  No marked change in status since office pre-op visit.

## 2011-01-24 ENCOUNTER — Other Ambulatory Visit: Payer: Self-pay | Admitting: Obstetrics and Gynecology

## 2011-01-24 NOTE — Patient Instructions (Addendum)
   Your procedure is scheduled on: Tuesday  Enter through the Main Entrance of [facility] at:9:30 am Pick up the phone at the desk and dial [phone]  Please call this number if you have any problems the morning of surgery: [phone]  Remember: Do not eat food after midnight  Do not drink clear liquids after:7 am Take these medicines the morning of surgery with a SIP OF WATER: hold your janumet for 24 hours  Do not wear jewelry, make-up, or FINGER nail polish Do not wear lotions, powders, or perfumes. Do not shave 48 hours prior to surgery. Do not bring valuables to the hospital.   Patients discharged on the day of surgery will not be allowed to drive home.  [id number] Name and phone number of your driver:   Remember to use your hibiclens as instructed.

## 2011-01-26 ENCOUNTER — Encounter (HOSPITAL_COMMUNITY): Payer: Self-pay

## 2011-01-26 ENCOUNTER — Encounter (HOSPITAL_COMMUNITY)
Admission: RE | Admit: 2011-01-26 | Discharge: 2011-01-26 | Disposition: A | Discharge status: Home / Self Care | Payer: 59 | Source: Ambulatory Visit | Attending: Obstetrics and Gynecology | Admitting: Obstetrics and Gynecology

## 2011-01-26 HISTORY — DX: Pure hypercholesterolemia, unspecified: E78.00

## 2011-01-26 MED ORDER — PANTOPRAZOLE SODIUM 40 MG PO TBEC: 40.0000 mg | DELAYED_RELEASE_TABLET | Freq: Once | ORAL | Status: DC | PRN

## 2011-01-26 MED ORDER — METOCLOPRAMIDE HCL 10 MG PO TABS: 10.0000 mg | ORAL_TABLET | Freq: Once | ORAL | Status: DC | PRN

## 2011-01-26 MED ORDER — SCOPOLAMINE 1 MG/3DAYS TD PT72: 1.0000 | MEDICATED_PATCH | Freq: Once | TRANSDERMAL | Status: DC | PRN

## 2011-01-26 MED ORDER — CITRIC ACID-SODIUM CITRATE 334-500 MG/5ML PO SOLN: 30.0000 mL | Freq: Once | ORAL | Status: DC | PRN

## 2011-01-26 MED ORDER — FAMOTIDINE 20 MG PO TABS: 20.0000 mg | ORAL_TABLET | Freq: Once | ORAL | Status: DC | PRN

## 2011-01-26 NOTE — Pre-Procedure Instructions (Signed)
Pt was here 12/08/10-labs stable, will draw all labs on dos when orders received.

## 2011-01-29 NOTE — H&P (Signed)
[name], [name] NO.:  [id number]  MEDICAL RECORD NO.:  [id number]  LOCATION:                                 FACILITY:  PHYSICIAN:  Randye Lobo, M.D.   DATE OF BIRTH:  June 23, 1955  DATE OF ADMISSION: DATE OF DISCHARGE:                             HISTORY & PHYSICAL   CHIEF COMPLAINT:  Incisional pain and swelling.  HISTORY OF PRESENT ILLNESS:  The patient is a 55 year old gravida 2, para 2 Caucasian female, status post TVT Xact midurethral sling with cystoscopy on December 08, 2010 at the [facility] [place], who presents to the office reporting suprapubic incisional pain and swelling.  The patient's surgery was uncomplicated.  She has been followed in the office during her postoperative recovery.  She has reported some discomfort on the left side and on examination was noted to have incisional swelling and tenderness.  The patient also reported an episode of blood in the urine.  She subsequently had a urinalysis which documents some amorphous crystals. There was no evidence of hematuria.  A urine culture returned showing less than 10,000 organisms of mixed urogenital flora.  A wet prep of the vagina documented no evidence of vaginitis.  The patient was treated with a course of ciprofloxacin 500 mg p.o. b.i.d. for 7 days along with a course of ibuprofen for possible osteitis pubis.  The patient was then seen back over a short term and she reported that well the incision was much less sore, but she still has noticed a protrusion of the left suprapubic incision and requested further evaluation and treatment.  PAST OBSTETRIC AND GYNECOLOGIC HISTORY: 1. Status post vaginal delivery x2. 2. Status post bilateral tubal ligation. 3. The patient is on the CombiPatch, estrogen and progesterone     therapy. 4. Last Pap smear in May 2012 was within normal limits. 5. Last mammogram in March 2012 was within normal limits. 6. She is status post  cryoablation.  PAST MEDICAL HISTORY: 1. Diabetes mellitus.  The patient is on Janumet and another oral     hypoglycemic. 2. Tobacco use.  The patient smokes 1/2 packet of cigarettes per day. 3. History of nephrolithiasis. 4. History of urinary tract infections rarely. 5. Hyperlipidemia. 6. Gastroesophageal reflux disease.  PAST SURGICAL HISTORY: 1. Status post TVT Xact midurethral sling with cystoscopy on December 08, 2010. 2. Status post bilateral tubal ligations. 3. Status post cryoablation of the uterus. 4. Status post neck surgery.  MEDICATIONS:  Zocor, Protonix, Janumet, oral hypoglycemic of unknown name, CombiPatch 0.05/0.25.  ALLERGIES:  SULFA.  SOCIAL HISTORY:  The patient is married.  She is a Futures trader.  She smokes one-half-pack of cigarettes per day.  She denies the use of alcohol or illicit drugs.  REVIEW OF SYSTEMS:  Please refer to the history of present illness.  PHYSICAL EXAMINATION:  LUNGS:  Clear to auscultation bilaterally. HEART:  S1 and S2 with a regular rate and rhythm. ABDOMEN:  Soft and nontender.  There is no evidence of hepatosplenomegaly or organomegaly. PELVIC:  There is a 1-cm, mildly tender left suprapubic mass thought to be consistent with excess sling.  On vaginal exam, the urethra and the vagina demonstrate no lesions.  There is no suture material present. The sling is not visible.  The cervix demonstrates no lesions.  The uterus is small and nontender.  There is no evidence of any adnexal masses nor tenderness.  IMPRESSION:  The patient is a 55 year old gravida 2, para 2 female, status post tension-free vaginal tape Xact midurethral sling with cystoscopy, who presents with a left suprapubic mass which is probably excess sling material.  The patient had a semi-recent episode of hematuria.  PLAN:  The patient will undergo exploration of the left suprapubic wound with expected excision of excess sling material.  The patient will  also undergo a cystoscopy.  Risks, benefits, and alternatives have been reviewed with the patient, who wishes to proceed.     Randye Lobo, M.D.     BES/MEDQ  D:  01/29/2011  T:  01/29/2011  Job:  [id number]

## 2011-01-30 ENCOUNTER — Encounter (HOSPITAL_COMMUNITY)
Admission: RE | Disposition: A | Discharge status: Home / Self Care | Payer: Self-pay | Source: Ambulatory Visit | Attending: Obstetrics and Gynecology

## 2011-01-30 ENCOUNTER — Ambulatory Visit (HOSPITAL_COMMUNITY)
Admission: RE | Admit: 2011-01-30 | Discharge: 2011-01-30 | Disposition: A | Discharge status: Home / Self Care | Payer: 59 | Source: Ambulatory Visit | Attending: Obstetrics and Gynecology | Admitting: Obstetrics and Gynecology

## 2011-01-30 ENCOUNTER — Encounter (HOSPITAL_COMMUNITY): Payer: Self-pay | Admitting: Anesthesiology

## 2011-01-30 ENCOUNTER — Encounter (HOSPITAL_COMMUNITY): Payer: Self-pay | Admitting: *Deleted

## 2011-01-30 ENCOUNTER — Other Ambulatory Visit: Payer: Self-pay | Admitting: Obstetrics and Gynecology

## 2011-01-30 ENCOUNTER — Ambulatory Visit (HOSPITAL_COMMUNITY): Payer: 59 | Admitting: Anesthesiology

## 2011-01-30 DIAGNOSIS — IMO0002 Reserved for concepts with insufficient information to code with codable children: Secondary | ICD-10-CM | POA: Insufficient documentation

## 2011-01-30 HISTORY — PX: BLADDER SUSPENSION: SHX72

## 2011-01-30 HISTORY — PX: CYSTOSCOPY: SHX5120

## 2011-01-30 LAB — CBC
MCV: 90.2 fL (ref 78.0–100.0)
Platelets: 287 10*3/uL (ref 150–400)
RBC: 4.3 MIL/uL (ref 3.87–5.11)
WBC: 10.4 10*3/uL (ref 4.0–10.5)

## 2011-01-30 LAB — BASIC METABOLIC PANEL
CO2: 26 mEq/L (ref 19–32)
Calcium: 9.7 mg/dL (ref 8.4–10.5)
Sodium: 138 mEq/L (ref 135–145)

## 2011-01-30 SURGERY — URETHROPEXY, USING TRANSVAGINAL TAPE
Anesthesia: Monitor Anesthesia Care

## 2011-01-30 MED ORDER — LIDOCAINE HCL 1 % IJ SOLN
INTRAMUSCULAR | Status: DC | PRN
  Administered 2011-01-30: 9 mL

## 2011-01-30 MED ORDER — DEXAMETHASONE SODIUM PHOSPHATE 10 MG/ML IJ SOLN
INTRAMUSCULAR | Status: DC | PRN
  Administered 2011-01-30: 10 mg via INTRAVENOUS

## 2011-01-30 MED ORDER — MIDAZOLAM HCL 5 MG/5ML IJ SOLN
INTRAMUSCULAR | Status: DC | PRN
  Administered 2011-01-30: 2 mg via INTRAVENOUS

## 2011-01-30 MED ORDER — METOCLOPRAMIDE HCL 5 MG/ML IJ SOLN
INTRAMUSCULAR | Status: AC
  Filled 2011-01-30: qty 2

## 2011-01-30 MED ORDER — HYDROMORPHONE HCL 1 MG/ML IJ SOLN
INTRAMUSCULAR | Status: DC | PRN
  Administered 2011-01-30: 1 mg via INTRAVENOUS
  Administered 2011-01-30 (×2): 0.5 mg via INTRAVENOUS

## 2011-01-30 MED ORDER — FENTANYL CITRATE 0.05 MG/ML IJ SOLN
INTRAMUSCULAR | Status: DC | PRN
  Administered 2011-01-30: 100 ug via INTRAVENOUS

## 2011-01-30 MED ORDER — PROPOFOL 10 MG/ML IV EMUL
INTRAVENOUS | Status: AC
  Filled 2011-01-30: qty 50

## 2011-01-30 MED ORDER — CEFAZOLIN SODIUM 1-5 GM-% IV SOLN: 1.0000 g | INTRAVENOUS | Status: DC

## 2011-01-30 MED ORDER — MEPERIDINE HCL 25 MG/ML IJ SOLN: 6.2500 mg | INTRAMUSCULAR | Status: DC | PRN

## 2011-01-30 MED ORDER — OXYCODONE-ACETAMINOPHEN 5-325 MG PO TABS: 1.0000 | ORAL_TABLET | ORAL | Status: AC | PRN

## 2011-01-30 MED ORDER — FENTANYL CITRATE 0.05 MG/ML IJ SOLN
INTRAMUSCULAR | Status: AC
  Filled 2011-01-30: qty 2

## 2011-01-30 MED ORDER — LACTATED RINGERS IV SOLN
INTRAVENOUS | Status: DC
  Administered 2011-01-30 (×2): via INTRAVENOUS

## 2011-01-30 MED ORDER — LIDOCAINE HCL (CARDIAC) 20 MG/ML IV SOLN
INTRAVENOUS | Status: AC
  Filled 2011-01-30: qty 5

## 2011-01-30 MED ORDER — HYDROMORPHONE HCL 1 MG/ML IJ SOLN
INTRAMUSCULAR | Status: AC
  Filled 2011-01-30: qty 1

## 2011-01-30 MED ORDER — ONDANSETRON HCL 4 MG/2ML IJ SOLN
INTRAMUSCULAR | Status: DC | PRN
  Administered 2011-01-30: 4 mg via INTRAVENOUS

## 2011-01-30 MED ORDER — METOCLOPRAMIDE HCL 5 MG/ML IJ SOLN
10.0000 mg | Freq: Once | INTRAMUSCULAR | Status: AC | PRN
  Administered 2011-01-30: 10 mg via INTRAVENOUS

## 2011-01-30 MED ORDER — FENTANYL CITRATE 0.05 MG/ML IJ SOLN: 25.0000 ug | INTRAMUSCULAR | Status: DC | PRN

## 2011-01-30 MED ORDER — STERILE WATER FOR IRRIGATION IR SOLN
Status: DC | PRN
  Administered 2011-01-30: 1000 mL

## 2011-01-30 MED ORDER — KETOROLAC TROMETHAMINE 30 MG/ML IJ SOLN
INTRAMUSCULAR | Status: DC | PRN
  Administered 2011-01-30: 30 mg via INTRAVENOUS

## 2011-01-30 MED ORDER — PROPOFOL 10 MG/ML IV EMUL
INTRAVENOUS | Status: DC | PRN
  Administered 2011-01-30: 100 mg/kg/h via INTRAVENOUS

## 2011-01-30 MED ORDER — CEFAZOLIN SODIUM 1-5 GM-% IV SOLN
INTRAVENOUS | Status: AC
  Administered 2011-01-30: 1 g via INTRAVENOUS
  Filled 2011-01-30: qty 50

## 2011-01-30 MED ORDER — MIDAZOLAM HCL 2 MG/2ML IJ SOLN
INTRAMUSCULAR | Status: AC
  Filled 2011-01-30: qty 2

## 2011-01-30 MED ORDER — ONDANSETRON HCL 4 MG/2ML IJ SOLN
INTRAMUSCULAR | Status: AC
  Filled 2011-01-30: qty 2

## 2011-01-30 MED ORDER — DEXAMETHASONE SODIUM PHOSPHATE 10 MG/ML IJ SOLN
INTRAMUSCULAR | Status: AC
  Filled 2011-01-30: qty 1

## 2011-01-30 MED ORDER — KETOROLAC TROMETHAMINE 30 MG/ML IJ SOLN
INTRAMUSCULAR | Status: AC
  Filled 2011-01-30: qty 1

## 2011-01-30 SURGICAL SUPPLY — 32 items
ADH SKN CLS APL DERMABOND .7 (GAUZE/BANDAGES/DRESSINGS) ×1
BLADE SURG 11 STRL SS (BLADE) ×2 IMPLANT
CANISTER SUCTION 2500CC (MISCELLANEOUS) ×2 IMPLANT
CATH FOLEY 2WAY SLVR  5CC 18FR (CATHETERS) ×1
CATH FOLEY 2WAY SLVR 5CC 18FR (CATHETERS) ×1 IMPLANT
CLOTH BEACON ORANGE TIMEOUT ST (SAFETY) ×2 IMPLANT
DECANTER SPIKE VIAL GLASS SM (MISCELLANEOUS) IMPLANT
DERMABOND ADVANCED (GAUZE/BANDAGES/DRESSINGS) ×1
DERMABOND ADVANCED .7 DNX12 (GAUZE/BANDAGES/DRESSINGS) IMPLANT
DRAPE CAMERA CLOSED 9X96 (DRAPES) ×2 IMPLANT
DRAPE HYSTEROSCOPY (DRAPE) ×2 IMPLANT
DRAPE UTILITY XL STRL (DRAPES) ×2 IMPLANT
GAUZE PACKING 2X5 YD STERILE (GAUZE/BANDAGES/DRESSINGS) ×2 IMPLANT
GLOVE BIO SURGEON STRL SZ 6.5 (GLOVE) ×4 IMPLANT
GOWN PREVENTION PLUS LG XLONG (DISPOSABLE) ×8 IMPLANT
NEEDLE HYPO 22GX1.5 SAFETY (NEEDLE) ×2 IMPLANT
NS IRRIG 1000ML POUR BTL (IV SOLUTION) ×2 IMPLANT
PACK VAGINAL WOMENS (CUSTOM PROCEDURE TRAY) ×2 IMPLANT
PLUG CATH AND CAP STER (CATHETERS) ×2 IMPLANT
SET CYSTO W/LG BORE CLAMP LF (SET/KITS/TRAYS/PACK) ×2 IMPLANT
SPONGE SURGIFOAM ABS GEL 12-7 (HEMOSTASIS) IMPLANT
SURGIFLO TRUKIT (HEMOSTASIS) IMPLANT
SUT VIC AB 0 CT1 27 (SUTURE)
SUT VIC AB 0 CT1 27XBRD ANBCTR (SUTURE) IMPLANT
SUT VIC AB 2-0 CT2 27 (SUTURE) IMPLANT
SUT VIC AB 2-0 SH 27 (SUTURE) ×6
SUT VIC AB 2-0 SH 27XBRD (SUTURE) ×3 IMPLANT
SUT VIC AB 2-0 UR6 27 (SUTURE) IMPLANT
TOWEL OR 17X24 6PK STRL BLUE (TOWEL DISPOSABLE) ×4 IMPLANT
TRAY FOLEY CATH 14FR (SET/KITS/TRAYS/PACK) ×2 IMPLANT
TUBING CONNECTING 10 (TUBING) ×2 IMPLANT
WATER STERILE IRR 1000ML POUR (IV SOLUTION) ×2 IMPLANT

## 2011-01-30 NOTE — H&P (Signed)
NAME:  [name], [name]                  ACCOUNT NO.:  [id number]  MEDICAL RECORD NO.:  [id number]  LOCATION:                                 FACILITY:  PHYSICIAN:  Brook E. Silva, M.D.   DATE OF BIRTH:  12/04/1955  DATE OF ADMISSION: DATE OF DISCHARGE:                             HISTORY & PHYSICAL   CHIEF COMPLAINT:  Incisional pain and swelling.  HISTORY OF PRESENT ILLNESS:  The patient is a 54-year-old gravida 2, para 2 Caucasian female, status post TVT Xact midurethral sling with cystoscopy on December 08, 2010 at the [facility] [place], who presents to the office reporting suprapubic incisional pain and swelling.  The patient's surgery was uncomplicated.  She has been followed in the office during her postoperative recovery.  She has reported some discomfort on the left side and on examination was noted to have incisional swelling and tenderness.  The patient also reported an episode of blood in the urine.  She subsequently had a urinalysis which documents some amorphous crystals. There was no evidence of hematuria.  A urine culture returned showing less than 10,000 organisms of mixed urogenital flora.  A wet prep of the vagina documented no evidence of vaginitis.  The patient was treated with a course of ciprofloxacin 500 mg p.o. b.i.d. for 7 days along with a course of ibuprofen for possible osteitis pubis.  The patient was then seen back over a short term and she reported that well the incision was much less sore, but she still has noticed a protrusion of the left suprapubic incision and requested further evaluation and treatment.  PAST OBSTETRIC AND GYNECOLOGIC HISTORY: 1. Status post vaginal delivery x2. 2. Status post bilateral tubal ligation. 3. The patient is on the CombiPatch, estrogen and progesterone     therapy. 4. Last Pap smear in May 2012 was within normal limits. 5. Last mammogram in March 2012 was within normal limits. 6. She is status post  cryoablation.  PAST MEDICAL HISTORY: 1. Diabetes mellitus.  The patient is on Janumet and another oral     hypoglycemic. 2. Tobacco use.  The patient smokes 1/2 packet of cigarettes per day. 3. History of nephrolithiasis. 4. History of urinary tract infections rarely. 5. Hyperlipidemia. 6. Gastroesophageal reflux disease.  PAST SURGICAL HISTORY: 1. Status post TVT Xact midurethral sling with cystoscopy on December 08, 2010. 2. Status post bilateral tubal ligations. 3. Status post cryoablation of the uterus. 4. Status post neck surgery.  MEDICATIONS:  Zocor, Protonix, Janumet, oral hypoglycemic of unknown name, CombiPatch 0.05/0.25.  ALLERGIES:  SULFA.  SOCIAL HISTORY:  The patient is married.  She is a homemaker.  She smokes one-half-pack of cigarettes per day.  She denies the use of alcohol or illicit drugs.  REVIEW OF SYSTEMS:  Please refer to the history of present illness.  PHYSICAL EXAMINATION:  LUNGS:  Clear to auscultation bilaterally. HEART:  S1 and S2 with a regular rate and rhythm. ABDOMEN:  Soft and nontender.  There is no evidence of hepatosplenomegaly or organomegaly. PELVIC:  There is a 1-cm, mildly tender left suprapubic mass thought to be consistent with excess sling.    On vaginal exam, the urethra and the vagina demonstrate no lesions.  There is no suture material present. The sling is not visible.  The cervix demonstrates no lesions.  The uterus is small and nontender.  There is no evidence of any adnexal masses nor tenderness.  IMPRESSION:  The patient is a 54-year-old gravida 2, para 2 female, status post tension-free vaginal tape Xact midurethral sling with cystoscopy, who presents with a left suprapubic mass which is probably excess sling material.  The patient had a semi-recent episode of hematuria.  PLAN:  The patient will undergo exploration of the left suprapubic wound with expected excision of excess sling material.  The patient will  also undergo a cystoscopy.  Risks, benefits, and alternatives have been reviewed with the patient, who wishes to proceed.     Brook E. Silva, M.D.     BES/MEDQ  D:  01/29/2011  T:  01/29/2011  Job:  [id number] 

## 2011-01-30 NOTE — Brief Op Note (Signed)
01/30/2011  12:50 PM  PATIENT:  Genevra S Mccollam  55 y.o. female  PRE-OPERATIVE DIAGNOSIS:  suprapubic mass, episode of hematuria  POST-OPERATIVE DIAGNOSIS:  suprapubic mass, episode of hematuria  PROCEDURE:  Procedure(s): EXCISION OF EXCESS LEFT SUPRAPUBIC SLING CYSTOSCOPY  SURGEON:  Surgeon(s): [employer]  PHYSICIAN ASSISTANT:   ASSISTANTS: none   ANESTHESIA:   IV sedation  OR FLUID I/O:  Total I/O In: 2000 [I.V.:2000] Out: 155 [Urine:150; Blood:5]  BLOOD ADMINISTERED:none  DRAINS: none   LOCAL MEDICATIONS USED:  LIDOCAINE CC  SPECIMEN:  Source of Specimen:  PORTION OF LEFT SUPRAPUBIC ARM OF SLING  DISPOSITION OF SPECIMEN:  PATHOLOGY  COUNTS:  YES  TOURNIQUET:  * No tourniquets in log *  DICTATION: .Other Dictation: Dictation Number   PLAN OF CARE: Discharge to home after PACU  PATIENT DISPOSITION:  PACU - hemodynamically stable.   Delay start of Pharmacological VTE agent (>24hrs) due to surgical blood loss or risk of bleeding:  not applicable

## 2011-01-30 NOTE — Progress Notes (Signed)
Pre-op Visit  LLQ skin irritation from cleansing to remove adhesive from Combipatch HRT.  OK to proceed with surgery.

## 2011-01-30 NOTE — Op Note (Signed)
[name], [name] NO.:  [id number]  MEDICAL RECORD NO.:  [id number]  LOCATION:  [facility]                          FACILITY:  [facility]  PHYSICIAN:  Randye Lobo, M.D.   DATE OF BIRTH:  May 08, 1955  DATE OF PROCEDURE:  01/30/2011 DATE OF DISCHARGE:                              OPERATIVE REPORT   PREOPERATIVE DIAGNOSES: 1. Left suprapubic incisional mass. 2. Status post TVT Exact midurethral sling with cystoscopy on December 08, 2010. 3. Isolated episode of hematuria.  POSTOPERATIVE DIAGNOSES: 1. Excess suprapubic sling. 2. Normal cystoscopy.  PROCEDURE:  Exploration of left suprapubic incision and cystoscopy.  SURGEON:  Randye Lobo, MD  ANESTHESIA:  IV sedation, local 1% lidocaine.  IV FLUIDS:  1800 mL of Ringer's lactate.  ESTIMATED BLOOD LOSS:  5 mL.  URINE OUTPUT:  150 mL by I and O catheterization prior to procedure.  COMPLICATIONS:  None.  INDICATIONS FOR PROCEDURE:  The patient is a 55 year old, gravida 2, para 2, Caucasian female status post TVT Exact midurethral sling with cystoscopy on December 08, 2010, at the [facility] [place] who presented to the office reporting a left suprapubic incisional mass and pain.  The patient's surgery was unremarkable.  The patient also had an isolated episode of hematuria and a urinalysis documenting some amorphous crystals, but no significant bacterial growth.  The patient had a normal wet prep.  She was treated with a course of ciprofloxacin and ibuprofen.  The patient reported some improvement, but still significant left suprapubic incisional discomfort.  Plan is therefore made to proceed with exploration of the left suprapubic wound and cystoscopy after risks, benefits, and alternatives are reviewed.  FINDINGS:  Exploration of the left suprapubic wound demonstrated sling material which was the source of the mass.  Cystoscopy demonstrated a normal bladder throughout 360 degrees including the  bladder dome and trigone.  The ureters were patent bilaterally.  There was no evidence of foreign body in the bladder or the urethra.  SPECIMEN:  The excess sling was sent to Pathology.  PROCEDURE:  The patient was reidentified in the preoperative hold area. She received Ancef 1 g IV for antibiotic prophylaxis.  In the operating room, the patient was placed in the supine position and sedation anesthesia was induced.  She was then placed in the dorsal lithotomy position and the lower abdomen and vagina were sterilely prepped.  The bladder was catheterized of urine.  The patient was then sterilely draped.  The procedure began by infiltration of the left suprapubic incision with 1% lidocaine.  A 1-inch incision was created along the patient's previous incision and an exploration of the subcutaneous layer immediately revealed sling material which was filled with fibrotic end growth.  The excess sling was dissected from the surrounding subcutaneous tissue and was excised.  The excess sling was excised and was sent to Pathology.  Hemostasis was created in the subcutaneous layer with monopolar cautery.  A single interrupted suture of 2-0 Vicryl was placed in the subcutaneous layer.  The skin was closed with a subcuticular suture of 3- 0 Vicryl.  Dermabond was placed over the incision.  Cystoscopy was  performed at this time and the findings were as noted above.  The bladder was then drained of all cystoscopic fluid.  This concluded the patient's procedure.  She was taken out of dorsal lithotomy position, awakened, and escorted to the recovery room in stable condition.  All needle, instrument, and sponge counts were correct.     Randye Lobo, M.D.     BES/MEDQ  D:  01/30/2011  T:  01/30/2011  Job:  [id number]

## 2011-01-30 NOTE — Transfer of Care (Signed)
Immediate Anesthesia Transfer of Care Note  Patient: [name]  Procedure(s) Performed:  TRANSVAGINAL TAPE (TVT) PROCEDURE - excision of excess sling from suprapubic incision and cystoscopy; CYSTOSCOPY  Patient Location: PACU  Anesthesia Type: MAC  Level of Consciousness: awake, alert  and oriented  Airway & Oxygen Therapy: Patient Spontanous Breathing and Patient connected to nasal cannula oxygen  Post-op Assessment: Report given to PACU RN and Post -op Vital signs reviewed and stable  Post vital signs: Reviewed and stable  Complications: No apparent anesthesia complications

## 2011-01-30 NOTE — Anesthesia Preprocedure Evaluation (Addendum)
Anesthesia Evaluation  General Assessment Comment  Reviewed: Allergy & Precautions, H&P , Patient's Chart, lab work & pertinent test results  History of Anesthesia Complications (+) PONV  Airway Mallampati: II TM Distance: >3 FB Neck ROM: full    Dental No notable dental hx. (+) Teeth Intact   Pulmonary  clear to auscultation  pulmonary exam normalPulmonary Exam Normal breath sounds clear to auscultation none    Cardiovascular regular Normal    Neuro/Psych Negative Neurological ROS  Negative Psych ROS  GI/Hepatic/Renal   Endo/Other  (+) Well Controlled, Type 2,     Abdominal Normal abdominal exam  (+)   Musculoskeletal   Hematology   Peds  Reproductive/Obstetrics    Anesthesia Other Findings             Anesthesia Physical Anesthesia Plan  ASA: II  Anesthesia Plan: MAC   Post-op Pain Management:    Induction: Intravenous  Airway Management Planned: Natural Airway  Additional Equipment:   Intra-op Plan:   Post-operative Plan:   Informed Consent: I have reviewed the patients History and Physical, chart, labs and discussed the procedure including the risks, benefits and alternatives for the proposed anesthesia with the patient or authorized representative who has indicated his/her understanding and acceptance.   Dental Advisory Given  Plan Discussed with: Anesthesiologist and CRNA  Anesthesia Plan Comments:         Anesthesia Quick Evaluation

## 2011-01-31 ENCOUNTER — Encounter (HOSPITAL_COMMUNITY): Payer: Self-pay | Admitting: Obstetrics and Gynecology

## 2011-01-31 NOTE — Anesthesia Postprocedure Evaluation (Signed)
  Anesthesia Post-op Note  Patient: [name]  Procedure(s) Performed:  TRANSVAGINAL TAPE (TVT) PROCEDURE - excision of excess sling from suprapubic incision and cystoscopy; CYSTOSCOPY  Patient Location: PACU  Anesthesia Type: MAC  Level of Consciousness: awake, alert  and oriented  Airway and Oxygen Therapy: Patient Spontanous Breathing  Post-op Pain: none  Post-op Assessment: Post-op Vital signs reviewed, Patient's Cardiovascular Status Stable, Respiratory Function Stable, Patent Airway, No signs of Nausea or vomiting, Adequate PO intake and Pain level controlled  Post-op Vital Signs: Reviewed and stable  Complications: No apparent anesthesia complications

## 2011-02-05 ENCOUNTER — Encounter (HOSPITAL_COMMUNITY): Payer: Self-pay | Admitting: Obstetrics and Gynecology

## 2011-02-07 ENCOUNTER — Other Ambulatory Visit: Payer: Self-pay | Admitting: Dermatology

## 2011-10-26 ENCOUNTER — Other Ambulatory Visit: Payer: Self-pay | Admitting: Obstetrics and Gynecology

## 2012-07-10 LAB — HM MAMMOGRAPHY

## 2012-10-29 ENCOUNTER — Other Ambulatory Visit: Payer: Self-pay | Admitting: Obstetrics and Gynecology

## 2013-01-08 ENCOUNTER — Other Ambulatory Visit (HOSPITAL_COMMUNITY): Payer: Self-pay | Admitting: Internal Medicine

## 2013-01-08 ENCOUNTER — Ambulatory Visit (HOSPITAL_COMMUNITY)
Admission: RE | Admit: 2013-01-08 | Discharge: 2013-01-08 | Disposition: A | Discharge status: Home / Self Care | Payer: 59 | Source: Ambulatory Visit | Attending: Internal Medicine | Admitting: Internal Medicine

## 2013-01-08 DIAGNOSIS — R05 Cough: Secondary | ICD-10-CM

## 2013-01-08 DIAGNOSIS — E119 Type 2 diabetes mellitus without complications: Secondary | ICD-10-CM | POA: Insufficient documentation

## 2013-01-08 DIAGNOSIS — R059 Cough, unspecified: Secondary | ICD-10-CM | POA: Insufficient documentation

## 2013-01-08 DIAGNOSIS — Z87891 Personal history of nicotine dependence: Secondary | ICD-10-CM | POA: Insufficient documentation

## 2013-03-27 ENCOUNTER — Other Ambulatory Visit: Payer: Self-pay | Admitting: Internal Medicine

## 2013-04-19 ENCOUNTER — Other Ambulatory Visit: Payer: Self-pay | Admitting: Internal Medicine

## 2013-04-27 ENCOUNTER — Encounter: Payer: Self-pay | Admitting: Emergency Medicine

## 2013-04-27 ENCOUNTER — Ambulatory Visit (INDEPENDENT_AMBULATORY_CARE_PROVIDER_SITE_OTHER): Payer: 59 | Admitting: Emergency Medicine

## 2013-04-27 VITALS — BP 122/74 | HR 74 | Temp 98.0°F | Resp 18 | Ht 66.0 in | Wt 182.0 lb

## 2013-04-27 DIAGNOSIS — I10 Essential (primary) hypertension: Secondary | ICD-10-CM

## 2013-04-27 DIAGNOSIS — R05 Cough: Secondary | ICD-10-CM

## 2013-04-27 DIAGNOSIS — E119 Type 2 diabetes mellitus without complications: Secondary | ICD-10-CM

## 2013-04-27 DIAGNOSIS — F17200 Nicotine dependence, unspecified, uncomplicated: Secondary | ICD-10-CM

## 2013-04-27 DIAGNOSIS — E559 Vitamin D deficiency, unspecified: Secondary | ICD-10-CM

## 2013-04-27 DIAGNOSIS — E1165 Type 2 diabetes mellitus with hyperglycemia: Secondary | ICD-10-CM | POA: Insufficient documentation

## 2013-04-27 DIAGNOSIS — R7309 Other abnormal glucose: Secondary | ICD-10-CM

## 2013-04-27 DIAGNOSIS — E7800 Pure hypercholesterolemia, unspecified: Secondary | ICD-10-CM

## 2013-04-27 DIAGNOSIS — K219 Gastro-esophageal reflux disease without esophagitis: Secondary | ICD-10-CM

## 2013-04-27 DIAGNOSIS — E782 Mixed hyperlipidemia: Secondary | ICD-10-CM

## 2013-04-27 LAB — BASIC METABOLIC PANEL WITH GFR
BUN: 18 mg/dL (ref 6–23)
Calcium: 9.5 mg/dL (ref 8.4–10.5)
Creat: 1 mg/dL (ref 0.50–1.10)
GFR, Est African American: 73 mL/min
GFR, Est Non African American: 64 mL/min
Glucose, Bld: 259 mg/dL — ABNORMAL HIGH (ref 70–99)

## 2013-04-27 LAB — LIPID PANEL: Triglycerides: 539 mg/dL — ABNORMAL HIGH (ref <150)

## 2013-04-27 LAB — HEPATIC FUNCTION PANEL
Bilirubin, Direct: 0.1 mg/dL (ref 0.0–0.3)
Indirect Bilirubin: 0.3 mg/dL (ref 0.0–0.9)
Total Bilirubin: 0.4 mg/dL (ref 0.3–1.2)
Total Protein: 6.9 g/dL (ref 6.0–8.3)

## 2013-04-27 MED ORDER — PREDNISONE 10 MG PO TABS: ORAL_TABLET | ORAL | Status: DC

## 2013-04-27 MED ORDER — AZITHROMYCIN 250 MG PO TABS: ORAL_TABLET | ORAL | Status: AC

## 2013-04-27 MED ORDER — BENZONATATE 100 MG PO CAPS: 100.0000 mg | ORAL_CAPSULE | Freq: Three times a day (TID) | ORAL | Status: DC | PRN

## 2013-04-27 NOTE — Progress Notes (Signed)
Subjective:    Patient ID: [name], female    DOB: 05/15/1955, 57 y.o.   MRN: [id number]  HPI Comments: 57 yo female presents for 3 month F/U for HTN, Cholesterol, DM, D. Deficient. She was eating better and trying to increase exercise until she recently became sick. LAST LABS BS196 T 192 TG 272 H 46 L 92 A1C 8.7 MAG 1.8 Her BP has been good at home..    2 weeks of sinus congestion with increasing rattling cough. She has been using robitussin Dm with no relief. She has noticed increased production with cough and nasal production. She is still smoking.   Hyperlipidemia  Diabetes  Cough   Current Outpatient Prescriptions on File Prior to Visit  Medication Sig Dispense Refill  . cloNIDine (CATAPRES) 0.1 MG tablet TAKE 1 TABLET BY MOUTH AT BEDTIME FOR HOT FLASHES  90 tablet  0  . fenofibrate micronized (LOFIBRA) 134 MG capsule TAKE 1 CAPSULE BY MOUTH ONCE A DAY  90 capsule  0  . glimepiride (AMARYL) 2 MG tablet Take 2 mg by mouth every evening.        . Multiple Vitamins-Minerals (MULTIVITAMINS THER. W/MINERALS) TABS Take 1 tablet by mouth daily.        Marland Kitchen OVER THE COUNTER MEDICATION Take 1 tablet by mouth 2 (two) times daily. PATIENT TAKES CINNAMON TABLETS 1000mg       . simvastatin (ZOCOR) 10 MG tablet Take 40 mg by mouth at bedtime.       . sitaGLIPtan-metformin (JANUMET) 50-1000 MG per tablet Take 1 tablet by mouth 2 (two) times daily.        Marland Kitchen estradiol-norethindrone (COMBIPATCH) 0.05-0.25 MG/DAY Place 1 patch onto the skin 2 (two) times a week.         No current facility-administered medications on file prior to visit.    Review of patient's allergies indicates no known allergies.  Past Medical History  Diagnosis Date  . Complication of anesthesia     woke during surgery (ablation) 2006?  Marland Kitchen Blood transfusion     1984   [facility]  . Chronic kidney disease     occasional renal stone  . Hypercholesteremia     on meds  . Diabetes mellitus     type 2 Diab- A1C-7  . GERD  (gastroesophageal reflux disease)     occasional Protonix-will take protonix night before surgery      Review of Systems  HENT: Positive for congestion and sinus pressure.   Respiratory: Positive for cough.   All other systems reviewed and are negative.  BP 122/74  Pulse 74  Temp(Src) 98 F (36.7 C) (Temporal)  Resp 18  Ht 5\' 6"  (1.676 m)  Wt 182 lb (82.555 kg)  BMI 29.39 kg/m2      Objective:   Physical Exam  Nursing note and vitals reviewed. Constitutional: She is oriented to person, place, and time. She appears well-developed and well-nourished. No distress.  HENT:  Head: Normocephalic and atraumatic.  Right Ear: External ear normal.  Left Ear: External ear normal.  Nose: Nose normal.  Mouth/Throat: Oropharynx is clear and moist. No oropharyngeal exudate.  Maxillary tenderness  Eyes: Conjunctivae and EOM are normal.  Neck: Normal range of motion. Neck supple. No JVD present. No thyromegaly present.  Cardiovascular: Normal rate, regular rhythm, normal heart sounds and intact distal pulses.   Pulmonary/Chest: Effort normal. She has wheezes.  Clears with cough  Abdominal: Soft. Bowel sounds are normal. She exhibits no distension and no  mass. There is no tenderness. There is no rebound and no guarding.  Musculoskeletal: Normal range of motion. She exhibits no edema and no tenderness.  Lymphadenopathy:    She has no cervical adenopathy.  Neurological: She is alert and oriented to person, place, and time. No cranial nerve deficit.  Skin: Skin is warm and dry. No rash noted. No erythema. No pallor.  Psychiatric: She has a normal mood and affect. Her behavior is normal. Judgment and thought content normal.          Assessment & Plan:  1.  3 month F/U for HTN, Cholesterol, DM, D. Deficient. Needs healthy diet, cardio QD and obtain healthy weight. Check Labs, Check BP if >130/80 call office 2. Cough-Zpak, tessalon Perles 100 mg, Pred 10 mg DP AD 3. Tobacco cessation  discussed

## 2013-04-27 NOTE — Patient Instructions (Addendum)

## 2013-04-28 ENCOUNTER — Telehealth: Payer: Self-pay | Admitting: *Deleted

## 2013-04-28 LAB — CBC WITH DIFFERENTIAL/PLATELET
Basophils Absolute: 0.1 10*3/uL (ref 0.0–0.1)
HCT: 40 % (ref 36.0–46.0)
Lymphocytes Relative: 36 % (ref 12–46)
Lymphs Abs: 3.8 10*3/uL (ref 0.7–4.0)
Monocytes Absolute: 0.5 10*3/uL (ref 0.1–1.0)
Neutro Abs: 6 10*3/uL (ref 1.7–7.7)
RBC: 4.6 MIL/uL (ref 3.87–5.11)
RDW: 13 % (ref 11.5–15.5)
WBC: 10.5 10*3/uL (ref 4.0–10.5)

## 2013-04-28 LAB — HEMOGLOBIN A1C: Mean Plasma Glucose: 223 mg/dL — ABNORMAL HIGH (ref <117)

## 2013-04-28 NOTE — Telephone Encounter (Signed)
Message copied by Nicholaus Corolla A on Tue Apr 28, 2013 10:55 AM ------      Message from: Max [place], [place] R      Created: Tue Apr 28, 2013  8:51 AM       Triglycerides, Cholesterol, A1c high need better/ healthier diet, cardio and weight loss. Come by get samples of New RX Jardiance similar to Invokana need to increase H2o. Call with bs list after 2 weeks and needs lab only after 1 month and before RX called in for BMP Insulin recheck. If things do not improve we are going to add INSULIN. Continue vitamins and prescriptions same.       ------

## 2013-05-07 LAB — HM COLONOSCOPY

## 2013-05-11 ENCOUNTER — Ambulatory Visit (INDEPENDENT_AMBULATORY_CARE_PROVIDER_SITE_OTHER): Payer: 59 | Admitting: Emergency Medicine

## 2013-05-11 ENCOUNTER — Encounter: Payer: Self-pay | Admitting: Emergency Medicine

## 2013-05-11 VITALS — BP 128/70 | HR 84 | Temp 97.8°F | Resp 18 | Ht 65.0 in | Wt 177.0 lb

## 2013-05-11 DIAGNOSIS — K219 Gastro-esophageal reflux disease without esophagitis: Secondary | ICD-10-CM

## 2013-05-11 DIAGNOSIS — R1013 Epigastric pain: Secondary | ICD-10-CM

## 2013-05-11 LAB — CBC WITH DIFFERENTIAL/PLATELET
BASOS PCT: 0 % (ref 0–1)
Basophils Absolute: 0 10*3/uL (ref 0.0–0.1)
EOS ABS: 0.1 10*3/uL (ref 0.0–0.7)
EOS PCT: 1 % (ref 0–5)
HCT: 42.9 % (ref 36.0–46.0)
Hemoglobin: 14.4 g/dL (ref 12.0–15.0)
LYMPHS ABS: 3.5 10*3/uL (ref 0.7–4.0)
LYMPHS PCT: 28 % (ref 12–46)
MCH: 29 pg (ref 26.0–34.0)
MCHC: 33.6 g/dL (ref 30.0–36.0)
MCV: 86.5 fL (ref 78.0–100.0)
MONO ABS: 0.7 10*3/uL (ref 0.1–1.0)
MONOS PCT: 6 % (ref 3–12)
NEUTROS ABS: 8.3 10*3/uL — AB (ref 1.7–7.7)
NEUTROS PCT: 65 % (ref 43–77)
PLATELETS: 301 10*3/uL (ref 150–400)
RBC: 4.96 MIL/uL (ref 3.87–5.11)
RDW: 13 % (ref 11.5–15.5)
WBC: 12.6 10*3/uL — AB (ref 4.0–10.5)

## 2013-05-11 NOTE — Patient Instructions (Signed)
Diet for Gastroesophageal Reflux Disease, Adult Reflux (acid reflux) is when acid from your stomach flows up into the esophagus. When acid comes in contact with the esophagus, the acid causes irritation and soreness (inflammation) in the esophagus. When reflux happens often or so severely that it causes damage to the esophagus, it is called gastroesophageal reflux disease (GERD). Nutrition therapy can help ease the discomfort of GERD. FOODS OR DRINKS TO AVOID OR LIMIT  Smoking or chewing tobacco. Nicotine is one of the most potent stimulants to acid production in the gastrointestinal tract.  Caffeinated and decaffeinated coffee and black tea.  Regular or low-calorie carbonated beverages or energy drinks (caffeine-free carbonated beverages are allowed).   Strong spices, such as black pepper, white pepper, red pepper, cayenne, curry powder, and chili powder.  Peppermint or spearmint.  Chocolate.  High-fat foods, including meats and fried foods. Extra added fats including oils, butter, salad dressings, and nuts. Limit these to less than 8 tsp per day.  Fruits and vegetables if they are not tolerated, such as citrus fruits or tomatoes.  Alcohol.  Any food that seems to aggravate your condition. If you have questions regarding your diet, call your caregiver or a registered dietitian. OTHER THINGS THAT MAY HELP GERD INCLUDE:   Eating your meals slowly, in a relaxed setting.  Eating 5 to 6 small meals per day instead of 3 large meals.  Eliminating food for a period of time if it causes distress.  Not lying down until 3 hours after eating a meal.  Keeping the head of your bed raised 6 to 9 inches (15 to 23 cm) by using a foam wedge or blocks under the legs of the bed. Lying flat may make symptoms worse.  Being physically active. Weight loss may be helpful in reducing reflux in overweight or obese adults.  Wear loose fitting clothing EXAMPLE MEAL PLAN This meal plan is approximately  2,000 calories based on [email or web address] meal planning guidelines. Breakfast   cup cooked oatmeal.  1 cup strawberries.  1 cup low-fat milk.  1 oz almonds. Snack  1 cup cucumber slices.  6 oz yogurt (made from low-fat or fat-free milk). Lunch  2 slice whole-wheat bread.  2 oz sliced [place].  2 tsp mayonnaise.  1 cup blueberries.  1 cup snap peas. Snack  6 whole-wheat crackers.  1 oz string cheese. Dinner   cup brown rice.  1 cup mixed veggies.  1 tsp olive oil.  3 oz grilled fish. Document Released: 04/23/2005 Document Revised: 07/16/2011 Document Reviewed: 03/09/2011 [facility] Patient Information 2014 [place], [place]. Cholecystitis  Cholecystitis is swelling and irritation (inflammation) of your gallbladder. This often happens when gallstones or sludge build up in the gallbladder. Treatment is needed right away. [place] care depends on how you were treated. In general:  If you were given antibiotic medicine, take it as told. Finish the medicine even if you start to feel better.  Only take medicines as told by your doctor.  Eat low-fat foods until your next doctor visit.  Keep all doctor visits as told. GET HELP RIGHT AWAY IF:  You have more pain and medicine does not help.  Your pain moves to a different part of your belly (abdomen) or to your back.  You have a fever.  You feel sick to your stomach (nauseous).  You throw up (vomit). MAKE SURE YOU:  Understand these instructions.  Will watch your condition.  Will get help right away if you are not doing well  or get worse. Document Released: 04/12/2011 Document Revised: 07/16/2011 Document Reviewed: 04/12/2011 [facility] Patient Information 2014 [place], [place]. Acute Pancreatitis Acute pancreatitis is a disease in which the pancreas becomes suddenly irritated (inflamed). The pancreas is a large gland behind your stomach. The pancreas makes enzymes that help digest food. The pancreas  also makes 2 hormones that help control your blood sugar. Acute pancreatitis happens when the enzymes attack and damage the pancreas. Most attacks last a couple of days and can cause serious problems. HOME CARE  Follow your doctor's diet instructions. You may need to avoid alcohol and limit fat in your diet.  Eat small meals often.  Drink enough fluids to keep your pee (urine) clear or pale yellow.  Only take medicines as told by your doctor.  Avoid drinking alcohol if it caused your disease.  Do not smoke.  Get plenty of rest.  Check your blood sugar at home as told by your doctor.  Keep all doctor visits as told. GET HELP RIGHT AWAY IF:   You are unable to eat or keep fluids down.  Your pain becomes severe.  You have a fever or lasting symptoms for more than 2 to 3 days.  You have a fever and your symptoms suddenly get worse.  Your skin or the white part of your eyes turn yellow (jaundice).  You throw up (vomit).  You feel dizzy, or you pass out (faint).  Your blood sugar is high (over 300 mg/dL).  You do not get better as quickly as expected.  You have new or worsening symptoms.  You have lasting pain, weakness, or feel sick to your stomach (nauseous).  You get better and then have another pain attack. MAKE SURE YOU:   Understand these instructions.  Will watch your condition.  Will get help right away if you are not doing well or get worse. Document Released: 10/10/2007 Document Revised: 10/23/2011 Document Reviewed: 08/02/2011 [facility] Patient Information 2014 [place].

## 2013-05-11 NOTE — Telephone Encounter (Signed)
Spoke with patient about lab results and instructions. 

## 2013-05-11 NOTE — Progress Notes (Signed)
Subjective:    Patient ID: [name], female    DOB: 10-Mar-1956, 58 y.o.   MRN: [id number]  HPI Comments: 58 yo female with 3 days of increased epigastric abdomen pain worse with eating. She has not started new sx of Jardiance yet. She still has GB and history of ulcers many years ago. EGD due next year per [facility]. She had recent Prednisone for URI, she takes Pantoprazole QD without relief. She denies reflux but has increased belching.  Abdominal Pain    Current Outpatient Prescriptions on File Prior to Visit  Medication Sig Dispense Refill  . cloNIDine (CATAPRES) 0.1 MG tablet TAKE 1 TABLET BY MOUTH AT BEDTIME FOR HOT FLASHES  90 tablet  0  . estradiol-norethindrone (COMBIPATCH) 0.05-0.25 MG/DAY Place 1 patch onto the skin 2 (two) times a week.        . fenofibrate micronized (LOFIBRA) 134 MG capsule TAKE 1 CAPSULE BY MOUTH ONCE A DAY  90 capsule  0  . glimepiride (AMARYL) 2 MG tablet Take 2 mg by mouth every evening.        . Multiple Vitamins-Minerals (MULTIVITAMINS THER. W/MINERALS) TABS Take 1 tablet by mouth daily.        Marland Kitchen OVER THE COUNTER MEDICATION Take 1 tablet by mouth 2 (two) times daily. PATIENT TAKES CINNAMON TABLETS 1000mg       . simvastatin (ZOCOR) 10 MG tablet Take 40 mg by mouth at bedtime.       . sitaGLIPtan-metformin (JANUMET) 50-1000 MG per tablet Take 1 tablet by mouth 2 (two) times daily.         No current facility-administered medications on file prior to visit.    Review of patient's allergies indicates no known allergies.  Past Medical History  Diagnosis Date  . Complication of anesthesia     woke during surgery (ablation) 2006?  Marland Kitchen Blood transfusion     1984   [place]  . Chronic kidney disease     occasional renal stone  . Hypercholesteremia     on meds  . Diabetes mellitus     type 2 Diab- A1C-7  . GERD (gastroesophageal reflux disease)     occasional Protonix-will take protonix night before surgery     Review of Systems  Gastrointestinal:  Positive for abdominal pain.  All other systems reviewed and are negative.  BP 128/70  Pulse 84  Temp(Src) 97.8 F (36.6 C) (Temporal)  Resp 18  Ht 5\' 5"  (1.651 m)  Wt 177 lb (80.287 kg)  BMI 29.45 kg/m2      Objective:   Physical Exam  Nursing note and vitals reviewed. Constitutional: She is oriented to person, place, and time. She appears well-developed and well-nourished. No distress.  HENT:  Head: Normocephalic and atraumatic.  Right Ear: External ear normal.  Left Ear: External ear normal.  Nose: Nose normal.  Mouth/Throat: Oropharynx is clear and moist.  Eyes: Conjunctivae and EOM are normal.  Neck: Normal range of motion. Neck supple. No JVD present. No thyromegaly present.  Cardiovascular: Normal rate, regular rhythm, normal heart sounds and intact distal pulses.   Pulmonary/Chest: Effort normal and breath sounds normal.  Abdominal: Soft. Bowel sounds are normal. She exhibits no distension and no mass. There is tenderness. There is no rebound and no guarding.  RUQ and Epigastric  Musculoskeletal: Normal range of motion. She exhibits no edema and no tenderness.  Lymphadenopathy:    She has no cervical adenopathy.  Neurological: She is alert and oriented to person, place,  and time. No cranial nerve deficit.  Skin: Skin is warm and dry. No rash noted. No erythema. No pallor.  Psychiatric: She has a normal mood and affect. Her behavior is normal. Judgment and thought content normal.          Assessment & Plan:  1. Abdominal pain vs infection vs ulcer vs GB vs pancreatitis-W/c if pain continues for abdominal u/s. WILL GO TO ER IF SX INCREASE, check labs, bland diet, small portions, increase H2o 2. DM- Pt picked up Dibble today advised not to start until stomach sx have improved

## 2013-05-12 ENCOUNTER — Other Ambulatory Visit: Payer: Self-pay | Admitting: Internal Medicine

## 2013-05-12 ENCOUNTER — Other Ambulatory Visit: Payer: Self-pay | Admitting: Emergency Medicine

## 2013-05-12 LAB — BASIC METABOLIC PANEL WITH GFR
BUN: 15 mg/dL (ref 6–23)
CALCIUM: 9.5 mg/dL (ref 8.4–10.5)
CO2: 25 meq/L (ref 19–32)
CREATININE: 0.79 mg/dL (ref 0.50–1.10)
Chloride: 97 mEq/L (ref 96–112)
GFR, EST NON AFRICAN AMERICAN: 85 mL/min
Glucose, Bld: 317 mg/dL — ABNORMAL HIGH (ref 70–99)
Potassium: 4.5 mEq/L (ref 3.5–5.3)
SODIUM: 133 meq/L — AB (ref 135–145)

## 2013-05-12 LAB — HEPATIC FUNCTION PANEL
ALBUMIN: 4.4 g/dL (ref 3.5–5.2)
ALK PHOS: 84 U/L (ref 39–117)
ALT: 16 U/L (ref 0–35)
AST: 12 U/L (ref 0–37)
BILIRUBIN TOTAL: 0.3 mg/dL (ref 0.3–1.2)
Bilirubin, Direct: 0.1 mg/dL (ref 0.0–0.3)
Indirect Bilirubin: 0.2 mg/dL (ref 0.0–0.9)
TOTAL PROTEIN: 7 g/dL (ref 6.0–8.3)

## 2013-05-12 LAB — AMYLASE: Amylase: 27 U/L (ref 0–105)

## 2013-05-12 LAB — LIPASE: Lipase: 25 U/L (ref 0–75)

## 2013-05-12 MED ORDER — ESOMEPRAZOLE MAGNESIUM 40 MG PO CPDR: 40.0000 mg | DELAYED_RELEASE_CAPSULE | Freq: Every day | ORAL | Status: DC

## 2013-05-12 MED ORDER — LEVOFLOXACIN 500 MG PO TABS
500.0000 mg | ORAL_TABLET | Freq: Every day | ORAL | Status: AC
Start: 2013-05-12 — End: 2013-05-22

## 2013-05-14 ENCOUNTER — Other Ambulatory Visit: Payer: Self-pay | Admitting: Emergency Medicine

## 2013-05-14 DIAGNOSIS — R1010 Upper abdominal pain, unspecified: Secondary | ICD-10-CM

## 2013-05-18 ENCOUNTER — Ambulatory Visit
Admission: RE | Admit: 2013-05-18 | Discharge: 2013-05-18 | Disposition: A | Discharge status: Home / Self Care | Payer: 59 | Source: Ambulatory Visit | Attending: Emergency Medicine | Admitting: Emergency Medicine

## 2013-05-18 DIAGNOSIS — R1010 Upper abdominal pain, unspecified: Secondary | ICD-10-CM

## 2013-05-19 ENCOUNTER — Other Ambulatory Visit: Payer: Self-pay

## 2013-05-19 DIAGNOSIS — N281 Cyst of kidney, acquired: Secondary | ICD-10-CM

## 2013-05-20 ENCOUNTER — Other Ambulatory Visit: Payer: Self-pay | Admitting: Emergency Medicine

## 2013-05-20 MED ORDER — ROSUVASTATIN CALCIUM 10 MG PO TABS: 10.0000 mg | ORAL_TABLET | Freq: Every day | ORAL | Status: DC

## 2013-05-25 ENCOUNTER — Other Ambulatory Visit: Payer: Self-pay | Admitting: Urology

## 2013-05-25 DIAGNOSIS — N281 Cyst of kidney, acquired: Secondary | ICD-10-CM

## 2013-06-02 ENCOUNTER — Ambulatory Visit (HOSPITAL_COMMUNITY)
Admission: RE | Admit: 2013-06-02 | Discharge: 2013-06-02 | Disposition: A | Discharge status: Home / Self Care | Payer: 59 | Source: Ambulatory Visit | Attending: Urology | Admitting: Urology

## 2013-06-02 DIAGNOSIS — R160 Hepatomegaly, not elsewhere classified: Secondary | ICD-10-CM | POA: Insufficient documentation

## 2013-06-02 DIAGNOSIS — N289 Disorder of kidney and ureter, unspecified: Secondary | ICD-10-CM | POA: Insufficient documentation

## 2013-06-02 DIAGNOSIS — K7689 Other specified diseases of liver: Secondary | ICD-10-CM | POA: Insufficient documentation

## 2013-06-02 DIAGNOSIS — N281 Cyst of kidney, acquired: Secondary | ICD-10-CM | POA: Insufficient documentation

## 2013-06-02 MED ORDER — GADOBENATE DIMEGLUMINE 529 MG/ML IV SOLN
15.0000 mL | Freq: Once | INTRAVENOUS | Status: AC | PRN
  Administered 2013-06-02: 15 mL via INTRAVENOUS

## 2013-06-10 ENCOUNTER — Other Ambulatory Visit: Payer: Self-pay | Admitting: Emergency Medicine

## 2013-06-10 DIAGNOSIS — R899 Unspecified abnormal finding in specimens from other organs, systems and tissues: Secondary | ICD-10-CM

## 2013-06-11 ENCOUNTER — Other Ambulatory Visit: Payer: Self-pay

## 2013-06-11 DIAGNOSIS — R899 Unspecified abnormal finding in specimens from other organs, systems and tissues: Secondary | ICD-10-CM

## 2013-06-11 LAB — CBC WITH DIFFERENTIAL/PLATELET
BASOS ABS: 0 10*3/uL (ref 0.0–0.1)
Basophils Relative: 0 % (ref 0–1)
EOS PCT: 1 % (ref 0–5)
Eosinophils Absolute: 0.1 10*3/uL (ref 0.0–0.7)
HEMATOCRIT: 42.4 % (ref 36.0–46.0)
Hemoglobin: 14.4 g/dL (ref 12.0–15.0)
LYMPHS PCT: 37 % (ref 12–46)
Lymphs Abs: 3.5 10*3/uL (ref 0.7–4.0)
MCH: 29.9 pg (ref 26.0–34.0)
MCHC: 34 g/dL (ref 30.0–36.0)
MCV: 88 fL (ref 78.0–100.0)
MONO ABS: 0.6 10*3/uL (ref 0.1–1.0)
Monocytes Relative: 6 % (ref 3–12)
Neutro Abs: 5.3 10*3/uL (ref 1.7–7.7)
Neutrophils Relative %: 56 % (ref 43–77)
Platelets: 246 10*3/uL (ref 150–400)
RBC: 4.82 MIL/uL (ref 3.87–5.11)
RDW: 13.5 % (ref 11.5–15.5)
WBC: 9.6 10*3/uL (ref 4.0–10.5)

## 2013-06-11 LAB — BASIC METABOLIC PANEL WITH GFR
BUN: 14 mg/dL (ref 6–23)
CHLORIDE: 97 meq/L (ref 96–112)
CO2: 26 meq/L (ref 19–32)
CREATININE: 0.96 mg/dL (ref 0.50–1.10)
Calcium: 9.7 mg/dL (ref 8.4–10.5)
GFR, Est African American: 77 mL/min
GFR, Est Non African American: 67 mL/min
Glucose, Bld: 306 mg/dL — ABNORMAL HIGH (ref 70–99)
Potassium: 4.3 mEq/L (ref 3.5–5.3)
Sodium: 135 mEq/L (ref 135–145)

## 2013-06-12 ENCOUNTER — Other Ambulatory Visit: Payer: Self-pay | Admitting: Emergency Medicine

## 2013-06-12 MED ORDER — EMPAGLIFLOZIN 25 MG PO TABS: ORAL_TABLET | ORAL | Status: DC

## 2013-06-12 MED ORDER — ROSUVASTATIN CALCIUM 10 MG PO TABS: 10.0000 mg | ORAL_TABLET | Freq: Every day | ORAL | Status: DC

## 2013-06-12 MED ORDER — EMPAGLIFLOZIN 10 MG PO TABS: ORAL_TABLET | ORAL | Status: DC

## 2013-07-06 ENCOUNTER — Other Ambulatory Visit: Payer: Self-pay | Admitting: Emergency Medicine

## 2013-07-06 LAB — HM MAMMOGRAPHY: HM Mammogram: NORMAL

## 2013-07-07 ENCOUNTER — Other Ambulatory Visit: Payer: Self-pay | Admitting: *Deleted

## 2013-07-07 MED ORDER — EMPAGLIFLOZIN 25 MG PO TABS: ORAL_TABLET | ORAL | Status: DC

## 2013-07-07 MED ORDER — ROSUVASTATIN CALCIUM 10 MG PO TABS: 10.0000 mg | ORAL_TABLET | Freq: Every day | ORAL | Status: DC

## 2013-07-13 ENCOUNTER — Other Ambulatory Visit: Payer: Self-pay | Admitting: Emergency Medicine

## 2013-07-15 ENCOUNTER — Other Ambulatory Visit: Payer: Self-pay | Admitting: Emergency Medicine

## 2013-07-15 MED ORDER — EMPAGLIFLOZIN 25 MG PO TABS: ORAL_TABLET | ORAL | Status: DC

## 2013-08-14 ENCOUNTER — Telehealth: Payer: Self-pay

## 2013-08-14 ENCOUNTER — Other Ambulatory Visit: Payer: Self-pay | Admitting: Emergency Medicine

## 2013-08-14 MED ORDER — ROSUVASTATIN CALCIUM 20 MG PO TABS: 20.0000 mg | ORAL_TABLET | Freq: Every day | ORAL | Status: DC

## 2013-08-14 NOTE — Telephone Encounter (Signed)
Pt called requesting samples of Crestor 10mg  or Rx called in to [facility] [place]

## 2013-09-03 ENCOUNTER — Ambulatory Visit: Payer: Self-pay | Admitting: Physician Assistant

## 2013-09-20 ENCOUNTER — Other Ambulatory Visit: Payer: Self-pay | Admitting: Emergency Medicine

## 2013-09-24 ENCOUNTER — Ambulatory Visit (INDEPENDENT_AMBULATORY_CARE_PROVIDER_SITE_OTHER): Payer: 59 | Admitting: Emergency Medicine

## 2013-09-24 ENCOUNTER — Encounter: Payer: Self-pay | Admitting: Emergency Medicine

## 2013-09-24 VITALS — BP 122/64 | HR 84 | Temp 98.2°F | Resp 18 | Ht 66.0 in | Wt 163.0 lb

## 2013-09-24 DIAGNOSIS — K529 Noninfective gastroenteritis and colitis, unspecified: Secondary | ICD-10-CM

## 2013-09-24 DIAGNOSIS — J309 Allergic rhinitis, unspecified: Secondary | ICD-10-CM

## 2013-09-24 DIAGNOSIS — K5289 Other specified noninfective gastroenteritis and colitis: Secondary | ICD-10-CM

## 2013-09-24 MED ORDER — HYOSCYAMINE SULFATE 0.125 MG PO TABS
0.1250 mg | ORAL_TABLET | ORAL | Status: DC | PRN
Start: 2013-09-24 — End: 2013-10-06

## 2013-09-24 MED ORDER — CIPROFLOXACIN HCL 500 MG PO TABS: 500.0000 mg | ORAL_TABLET | Freq: Two times a day (BID) | ORAL | Status: AC

## 2013-09-24 MED ORDER — PREDNISONE 10 MG PO TABS: ORAL_TABLET | ORAL | Status: DC

## 2013-09-24 NOTE — Patient Instructions (Signed)
Colitis °Colitis is inflammation of the colon. Colitis can be a short-term or long-standing (chronic) illness. Crohn's disease and ulcerative colitis are 2 types of colitis which are chronic. They usually require lifelong treatment. °CAUSES  °There are many different causes of colitis, including: °· Viruses. °· Germs (bacteria). °· Medicine reactions. °SYMPTOMS  °· Diarrhea. °· Intestinal bleeding. °· Pain. °· Fever. °· Throwing up (vomiting). °· Tiredness (fatigue). °· Weight loss. °· Bowel blockage. °DIAGNOSIS  °The diagnosis of colitis is based on examination and stool or blood tests. X-rays, CT scan, and colonoscopy may also be needed. °TREATMENT  °Treatment may include: °· Fluids given through the vein (intravenously). °· Bowel rest (nothing to eat or drink for a period of time). °· Medicine for pain and diarrhea. °· Medicines (antibiotics) that kill germs. °· Cortisone medicines. °· Surgery. °HOME CARE INSTRUCTIONS  °· Get plenty of rest. °· Drink enough water and fluids to keep your urine clear or pale yellow. °· Eat a well-balanced diet. °· Call your caregiver for follow-up as recommended. °SEEK IMMEDIATE MEDICAL CARE IF:  °· You develop chills. °· You have an oral temperature above 102° F (38.9° C), not controlled by medicine. °· You have extreme weakness, fainting, or dehydration. °· You have repeated vomiting. °· You develop severe belly (abdominal) pain or are passing bloody or tarry stools. °MAKE SURE YOU:  °· Understand these instructions. °· Will watch your condition. °· Will get help right away if you are not doing well or get worse. °Document Released: 05/31/2004 Document Revised: 07/16/2011 Document Reviewed: 08/26/2009 °[employer]® Patient Information ©2014 [employer]. ° °

## 2013-09-24 NOTE — Progress Notes (Signed)
Subjective:    Patient ID: [name], female    DOB: December 10, 1955, 58 y.o.   MRN: [id number]  HPI Comments: 58 yo WF OV for diarrhea and allergies. She was at resort at [facility] and started having pain in back every time she took in deep breath. She started taking Ibuprofen which helped some. She then started having diarrhea, she has had x 5 days now. She had cramping initially but that stopped with d/c Ibuprofen. She notes she is eating better and drinking more water. She has stopped soda. She did drink tap water at resort. She has had increased sinus cold on/off over last several weeks but has remained clear.     Medication List       This list is accurate as of: 09/24/13 11:31 AM.  Always use your most recent med list.               Empagliflozin 25 MG Tabs  Commonly known as:  JARDIANCE  1 qd     multivitamins ther. w/minerals Tabs tablet  Take 1 tablet by mouth daily.     NEXIUM 40 MG capsule  Generic drug:  esomeprazole  TAKE 1 CAPSULE (40 MG TOTAL) BY MOUTH DAILY.     OVER THE COUNTER MEDICATION  Take 1 tablet by mouth 2 (two) times daily. PATIENT TAKES CINNAMON TABLETS 1000mg      rosuvastatin 20 MG tablet  Commonly known as:  CRESTOR  Take 1 tablet (20 mg total) by mouth at bedtime.       No Known Allergies  Past Medical History  Diagnosis Date  . Complication of anesthesia     woke during surgery (ablation) 2006?  Marland Kitchen Blood transfusion     1984   [place]  . Chronic kidney disease     occasional renal stone  . Hypercholesteremia     on meds  . Diabetes mellitus     type 2 Diab- A1C-7  . GERD (gastroesophageal reflux disease)     occasional Protonix-will take protonix night before surgery      Review of Systems  HENT: Positive for postnasal drip.   Cardiovascular: Positive for chest pain.  Gastrointestinal: Positive for diarrhea.  All other systems reviewed and are negative.  BP 122/64  Pulse 84  Temp(Src) 98.2 F (36.8 C) (Temporal)  Resp 18   Ht 5\' 6"  (1.676 m)  Wt 163 lb (73.936 kg)  BMI 26.32 kg/m2     Objective:   Physical Exam  Nursing note and vitals reviewed. Constitutional: She is oriented to person, place, and time. She appears well-developed and well-nourished.  HENT:  Head: Normocephalic and atraumatic.  Right Ear: External ear normal.  Left Ear: External ear normal.  Mouth/Throat: Oropharynx is clear and moist. No oropharyngeal exudate.  Cloudy TM's bilaterally   Eyes: Conjunctivae are normal.  Neck: Normal range of motion.  Cardiovascular: Normal rate, regular rhythm, normal heart sounds and intact distal pulses.   Pulmonary/Chest: Effort normal and breath sounds normal.  Abdominal: Soft. Bowel sounds are normal. She exhibits no distension and no mass. There is no tenderness. There is no rebound and no guarding.  Musculoskeletal: Normal range of motion.  Neurological: She is alert and oriented to person, place, and time.  Skin: Skin is warm and dry.  Psychiatric: She has a normal mood and affect. Judgment normal.          Assessment & Plan:  1. ? Colitis-Bland diet x 1 week, Probiotics x 1  week, Cipro 500 mg AD   2. ? Back strain- Pred DP 10mg  AD  3. Allergic rhinitis-Restart NS and Claritin

## 2013-10-05 ENCOUNTER — Encounter: Payer: Self-pay | Admitting: Emergency Medicine

## 2013-10-06 ENCOUNTER — Encounter: Payer: Self-pay | Admitting: Emergency Medicine

## 2013-10-06 ENCOUNTER — Ambulatory Visit (INDEPENDENT_AMBULATORY_CARE_PROVIDER_SITE_OTHER): Payer: 59 | Admitting: Emergency Medicine

## 2013-10-06 VITALS — BP 138/80 | HR 80 | Temp 98.0°F | Resp 18 | Ht 66.0 in | Wt 163.0 lb

## 2013-10-06 DIAGNOSIS — R030 Elevated blood-pressure reading, without diagnosis of hypertension: Secondary | ICD-10-CM

## 2013-10-06 DIAGNOSIS — E119 Type 2 diabetes mellitus without complications: Secondary | ICD-10-CM

## 2013-10-06 DIAGNOSIS — E782 Mixed hyperlipidemia: Secondary | ICD-10-CM

## 2013-10-06 DIAGNOSIS — R252 Cramp and spasm: Secondary | ICD-10-CM

## 2013-10-06 DIAGNOSIS — E559 Vitamin D deficiency, unspecified: Secondary | ICD-10-CM

## 2013-10-06 LAB — CBC WITH DIFFERENTIAL/PLATELET
BASOS ABS: 0 10*3/uL (ref 0.0–0.1)
Basophils Relative: 0 % (ref 0–1)
EOS PCT: 1 % (ref 0–5)
Eosinophils Absolute: 0.1 10*3/uL (ref 0.0–0.7)
HCT: 43.4 % (ref 36.0–46.0)
Hemoglobin: 15.1 g/dL — ABNORMAL HIGH (ref 12.0–15.0)
Lymphocytes Relative: 31 % (ref 12–46)
Lymphs Abs: 2.9 10*3/uL (ref 0.7–4.0)
MCH: 29.8 pg (ref 26.0–34.0)
MCHC: 34.8 g/dL (ref 30.0–36.0)
MCV: 85.6 fL (ref 78.0–100.0)
Monocytes Absolute: 0.5 10*3/uL (ref 0.1–1.0)
Monocytes Relative: 5 % (ref 3–12)
Neutro Abs: 5.8 10*3/uL (ref 1.7–7.7)
Neutrophils Relative %: 63 % (ref 43–77)
PLATELETS: 218 10*3/uL (ref 150–400)
RBC: 5.07 MIL/uL (ref 3.87–5.11)
RDW: 14.1 % (ref 11.5–15.5)
WBC: 9.2 10*3/uL (ref 4.0–10.5)

## 2013-10-06 LAB — HEMOGLOBIN A1C
Hgb A1c MFr Bld: 12.2 % — ABNORMAL HIGH (ref <5.7)
Mean Plasma Glucose: 303 mg/dL — ABNORMAL HIGH (ref <117)

## 2013-10-06 NOTE — Patient Instructions (Signed)
Diabetes and Foot Care Diabetes may cause you to have problems because of poor blood supply (circulation) to your feet and legs. This may cause the skin on your feet to become thinner, break easier, and heal more slowly. Your skin may become dry, and the skin may peel and crack. You may also have nerve damage in your legs and feet causing decreased feeling in them. You may not notice minor injuries to your feet that could lead to infections or more serious problems. Taking care of your feet is one of the most important things you can do for yourself.  HOME CARE INSTRUCTIONS  Wear shoes at all times, even in the house. Do not go barefoot. Bare feet are easily injured.  Check your feet daily for blisters, cuts, and redness. If you cannot see the bottom of your feet, use a mirror or ask someone for help.  Wash your feet with warm water (do not use hot water) and mild soap. Then pat your feet and the areas between your toes until they are completely dry. Do not soak your feet as this can dry your skin.  Apply a moisturizing lotion or petroleum jelly (that does not contain alcohol and is unscented) to the skin on your feet and to dry, brittle toenails. Do not apply lotion between your toes.  Trim your toenails straight across. Do not dig under them or around the cuticle. File the edges of your nails with an emery board or nail file.  Do not cut corns or calluses or try to remove them with medicine.  Wear clean socks or stockings every day. Make sure they are not too tight. Do not wear knee-high stockings since they may decrease blood flow to your legs.  Wear shoes that fit properly and have enough cushioning. To break in new shoes, wear them for just a few hours a day. This prevents you from injuring your feet. Always look in your shoes before you put them on to be sure there are no objects inside.  Do not cross your legs. This may decrease the blood flow to your feet.  If you find a minor scrape,  cut, or break in the skin on your feet, keep it and the skin around it clean and dry. These areas may be cleansed with mild soap and water. Do not cleanse the area with peroxide, alcohol, or iodine.  When you remove an adhesive bandage, be sure not to damage the skin around it.  If you have a wound, look at it several times a day to make sure it is healing.  Do not use heating pads or hot water bottles. They may burn your skin. If you have lost feeling in your feet or legs, you may not know it is happening until it is too late.  Make sure your health care provider performs a complete foot exam at least annually or more often if you have foot problems. Report any cuts, sores, or bruises to your health care provider immediately. SEEK MEDICAL CARE IF:   You have an injury that is not healing.  You have cuts or breaks in the skin.  You have an ingrown nail.  You notice redness on your legs or feet.  You feel burning or tingling in your legs or feet.  You have pain or cramps in your legs and feet.  Your legs or feet are numb.  Your feet always feel cold. SEEK IMMEDIATE MEDICAL CARE IF:   There is increasing redness,   swelling, or pain in or around a wound.  There is a red line that goes up your leg.  Pus is coming from a wound.  You develop a fever or as directed by your health care provider.  You notice a bad smell coming from an ulcer or wound. Document Released: 04/20/2000 Document Revised: 12/24/2012 Document Reviewed: 09/30/2012 [facility] Patient Information 2014 [place]. Smoking Cessation, Tips for Success If you are ready to quit smoking, congratulations! You have chosen to help yourself be healthier. Cigarettes bring nicotine, tar, carbon monoxide, and other irritants into your body. Your lungs, heart, and blood vessels will be able to work better without these poisons. There are many different ways to quit smoking. Nicotine gum, nicotine patches, a nicotine inhaler,  or nicotine nasal spray can help with physical craving. Hypnosis, support groups, and medicines help break the habit of smoking. WHAT THINGS CAN I DO TO MAKE QUITTING EASIER?  Here are some tips to help you quit for good:  Pick a date when you will quit smoking completely. Tell all of your friends and family about your plan to quit on that date.  Do not try to slowly cut down on the number of cigarettes you are smoking. Pick a quit date and quit smoking completely starting on that day.  Throw away all cigarettes.   Clean and remove all ashtrays from your home, work, and car.   On a card, write down your reasons for quitting. Carry the card with you and read it when you get the urge to smoke.   Cleanse your body of nicotine. Drink enough water and fluids to keep your urine clear or pale yellow. Do this after quitting to flush the nicotine from your body.   Learn to predict your moods. Do not let a bad situation be your excuse to have a cigarette. Some situations in your life might tempt you into wanting a cigarette.   Never have "just one" cigarette. It leads to wanting another and another. Remind yourself of your decision to quit.   Change habits associated with smoking. If you smoked while driving or when feeling stressed, try other activities to replace smoking. Stand up when drinking your coffee. Brush your teeth after eating. Sit in a different chair when you read the paper. Avoid alcohol while trying to quit, and try to drink fewer caffeinated beverages. Alcohol and caffeine may urge you to smoke.   Avoid foods and drinks that can trigger a desire to smoke, such as sugary or spicy foods and alcohol.   Ask people who smoke not to smoke around you.   Have something planned to do right after eating or having a cup of coffee. For example, plan to take a walk or exercise.   Try a relaxation exercise to calm you down and decrease your stress. Remember, you may be tense and  nervous for the first 2 weeks after you quit, but this will pass.   Find new activities to keep your hands busy. Play with a pen, coin, or rubber band. Doodle or draw things on paper.   Brush your teeth right after eating. This will help cut down on the craving for the taste of tobacco after meals. You can also try mouthwash.   Use oral substitutes in place of cigarettes. Try using lemon drops, carrots, cinnamon sticks, or chewing gum. Keep them handy so they are available when you have the urge to smoke.   When you have the urge to smoke,  try deep breathing.   Designate your home as a nonsmoking area.   If you are a heavy smoker, ask your health care provider about a prescription for nicotine chewing gum. It can ease your withdrawal from nicotine.   Reward yourself. Set aside the cigarette money you save and buy yourself something nice.   Look for support from others. Join a support group or smoking cessation program. Ask someone at home or at work to help you with your plan to quit smoking.   Always ask yourself, "Do I need this cigarette or is this just a reflex?" Tell yourself, "Today, I choose not to smoke," or "I do not want to smoke." You are reminding yourself of your decision to quit.  Do not replace cigarette smoking with electronic cigarettes (commonly called e-cigarettes). The safety of e-cigarettes is unknown, and some may contain harmful chemicals.  If you relapse, do not give up! Plan ahead and think about what you will do the next time you get the urge to smoke.  HOW WILL I FEEL WHEN I QUIT SMOKING? You may have symptoms of withdrawal because your body is used to nicotine (the addictive substance in cigarettes). You may crave cigarettes, be irritable, feel very hungry, cough often, get headaches, or have difficulty concentrating. The withdrawal symptoms are only temporary. They are strongest when you first quit but will go away within 10 14 days. When withdrawal  symptoms occur, stay in control. Think about your reasons for quitting. Remind yourself that these are signs that your body is healing and getting used to being without cigarettes. Remember that withdrawal symptoms are easier to treat than the major diseases that smoking can cause.  Even after the withdrawal is over, expect periodic urges to smoke. However, these cravings are generally short lived and will go away whether you smoke or not. Do not smoke!  WHAT RESOURCES ARE AVAILABLE TO HELP ME QUIT SMOKING? Your health care provider can direct you to community resources or hospitals for support, which may include:  Group support.  Education.  Hypnosis.  Therapy. Document Released: 01/20/2004 Document Revised: 02/11/2013 Document Reviewed: 10/09/2012 [facility] Patient Information 2014 [place], [place].

## 2013-10-06 NOTE — Progress Notes (Signed)
Subjective:    Patient ID: [name], female    DOB: 08-Dec-1955, 58 y.o.   MRN: [id number]  HPI Comments: 58 yo WF presents for 3 month F/U for Cholesterol, DM, D. deficient She notes BS are up with recent prednisone but notes it has helped and she is feeling better. She is exercising more with weights. SHe has been walking more. She is eating better. BS before prednisone 150-180s. She notes BP up with prednisone. He checks feet routinely and denies skin break down or neuropathy increase. She had noticed burning before starting Jardiance but that has resolved. She occasionally has toe cramping and denies relief with OTC potassium.   WBC             9.6   06/11/2013 HGB            14.4   06/11/2013 HCT            42.4   06/11/2013 PLT             246   06/11/2013 GLUCOSE         306   06/11/2013 CHOL            218   04/27/2013 TRIG            539   04/27/2013 HDL              43   04/27/2013 LDLCALC      *   04/27/2013   Comment:   Not calculated due to Triglyceride >400. Suggest ordering Direct LDL (Unit Code: [id number]).   Total Cholesterol/HDL Ratio:CHD Risk                        Coronary Heart Disease Risk Table                                        Men       Women          1/2 Average Risk              3.4        3.3              Average Risk              5.0        4.4           2X Average Risk              9.6        7.1           3X Average Risk             23.4       11.0 Use the calculated Patient Ratio above and the CHD Risk table  to determine the patient's CHD Risk. ATP III Classification (LDL):       < 100        mg/dL         Optimal      100 - 129     mg/dL         Near or Above Optimal      130 - 159     mg/dL         Borderline High      160 - 189  mg/dL         High       > 190        mg/dL         Very High   ALT              16   05/11/2013 AST              12   05/11/2013 NA              135   06/11/2013 K               4.3   06/11/2013 CL               97   06/11/2013 CREATININE      0.96   06/11/2013 BUN              14   06/11/2013 CO2              26   06/11/2013 HGBA1C          9.4   04/27/2013   Hyperlipidemia Associated symptoms include myalgias.  Gastrophageal Reflux  Diabetes     Medication List       This list is accurate as of: 10/06/13  9:19 AM.  Always use your most recent med list.               CALCIUM 500 PO  Take 500 mg by mouth every other day.     Empagliflozin 25 MG Tabs  Commonly known as:  JARDIANCE  1 qd     multivitamins ther. w/minerals Tabs tablet  Take 1 tablet by mouth daily.     NEXIUM 40 MG capsule  Generic drug:  esomeprazole  TAKE 1 CAPSULE (40 MG TOTAL) BY MOUTH DAILY.     OVER THE COUNTER MEDICATION  Take 1 tablet by mouth 2 (two) times daily. PATIENT TAKES CINNAMON TABLETS 1000mg      rosuvastatin 20 MG tablet  Commonly known as:  CRESTOR  Take 1 tablet (20 mg total) by mouth at bedtime.       Allergies  Allergen Reactions  . Invokana [Canagliflozin]     Skin peelin   Past Medical History  Diagnosis Date  . Complication of anesthesia     woke during surgery (ablation) 2006?  Marland Kitchen Blood transfusion     1984   [place]  . Chronic kidney disease     occasional renal stone  . Hypercholesteremia     on meds  . Diabetes mellitus     type 2 Diab- A1C-7  . GERD (gastroesophageal reflux disease)     occasional Protonix-will take protonix night before surgery      Review of Systems  Musculoskeletal: Positive for myalgias.  All other systems reviewed and are negative.  BP 138/80  Pulse 80  Temp(Src) 98 F (36.7 C) (Temporal)  Resp 18  Ht 5\' 6"  (1.676 m)  Wt 163 lb (73.936 kg)  BMI 26.32 kg/m2     Objective:   Physical Exam  Nursing note and vitals reviewed. Constitutional: She is oriented to person, place, and time. She appears well-developed and well-nourished. No distress.  HENT:  Head: Normocephalic and atraumatic.  Right Ear: External ear normal.  Left Ear: External ear normal.  Nose: Nose  normal.  Mouth/Throat: Oropharynx is clear and moist.  Eyes: Conjunctivae and EOM are normal.  Neck: Normal range of motion. Neck supple. No JVD present. No  thyromegaly present.  Cardiovascular: Normal rate, regular rhythm, normal heart sounds and intact distal pulses.   Pulmonary/Chest: Effort normal and breath sounds normal.  Abdominal: Soft. Bowel sounds are normal. She exhibits no distension. There is no tenderness.  Musculoskeletal: Normal range of motion. She exhibits no edema and no tenderness.  Lymphadenopathy:    She has no cervical adenopathy.  Neurological: She is alert and oriented to person, place, and time. No cranial nerve deficit.  Skin: Skin is warm and dry. No rash noted. No erythema. No pallor.  Psychiatric: She has a normal mood and affect. Her behavior is normal. Judgment and thought content normal.          Assessment & Plan:  1.  3 month F/U for Cholesterol,DM, D. Deficient. Needs healthy diet, cardio QD and obtain healthy weight. Check Labs, Check BP if >130/80 call office, Check BS if >200 call office

## 2013-10-07 ENCOUNTER — Other Ambulatory Visit: Payer: Self-pay | Admitting: Emergency Medicine

## 2013-10-07 LAB — MAGNESIUM: Magnesium: 1.9 mg/dL (ref 1.5–2.5)

## 2013-10-07 LAB — BASIC METABOLIC PANEL WITH GFR
BUN: 12 mg/dL (ref 6–23)
CALCIUM: 10.1 mg/dL (ref 8.4–10.5)
CHLORIDE: 101 meq/L (ref 96–112)
CO2: 23 mEq/L (ref 19–32)
Creat: 0.65 mg/dL (ref 0.50–1.10)
Glucose, Bld: 208 mg/dL — ABNORMAL HIGH (ref 70–99)
Potassium: 4.8 mEq/L (ref 3.5–5.3)
Sodium: 138 mEq/L (ref 135–145)

## 2013-10-07 LAB — LIPID PANEL
CHOL/HDL RATIO: 4.3 ratio
CHOLESTEROL: 243 mg/dL — AB (ref 0–200)
HDL: 56 mg/dL (ref >39)
LDL CALC: 139 mg/dL — AB (ref 0–99)
TRIGLYCERIDES: 239 mg/dL — AB (ref <150)
VLDL: 48 mg/dL — AB (ref 0–40)

## 2013-10-07 LAB — HEPATIC FUNCTION PANEL
ALT: 19 U/L (ref 0–35)
AST: 14 U/L (ref 0–37)
Albumin: 4.4 g/dL (ref 3.5–5.2)
Alkaline Phosphatase: 110 U/L (ref 39–117)
BILIRUBIN INDIRECT: 0.5 mg/dL (ref 0.2–1.2)
BILIRUBIN TOTAL: 0.6 mg/dL (ref 0.2–1.2)
Bilirubin, Direct: 0.1 mg/dL (ref 0.0–0.3)
TOTAL PROTEIN: 6.9 g/dL (ref 6.0–8.3)

## 2013-10-07 LAB — TSH: TSH: 1.003 u[IU]/mL (ref 0.350–4.500)

## 2013-10-07 LAB — INSULIN, FASTING: INSULIN FASTING, SERUM: 9 u[IU]/mL (ref 3–28)

## 2013-10-07 LAB — VITAMIN B12: VITAMIN B 12: 493 pg/mL (ref 211–911)

## 2013-10-07 LAB — VITAMIN D 25 HYDROXY (VIT D DEFICIENCY, FRACTURES): Vit D, 25-Hydroxy: 56 ng/mL (ref 30–89)

## 2013-10-07 MED ORDER — METFORMIN HCL ER 500 MG PO TB24: ORAL_TABLET | ORAL | Status: DC

## 2013-10-26 ENCOUNTER — Telehealth: Payer: Self-pay

## 2013-10-26 NOTE — Telephone Encounter (Signed)
Pt aware.

## 2013-10-26 NOTE — Telephone Encounter (Signed)
Pt is taking Crestor daily & c/o body aches. Pt has quit taking Crestor and is requesting different Rx. Please advise.

## 2013-10-29 ENCOUNTER — Other Ambulatory Visit: Payer: Self-pay | Admitting: Obstetrics and Gynecology

## 2013-10-30 LAB — CYTOLOGY - PAP

## 2013-11-02 ENCOUNTER — Other Ambulatory Visit: Payer: Self-pay | Admitting: Emergency Medicine

## 2013-11-04 ENCOUNTER — Encounter: Payer: Self-pay | Admitting: Emergency Medicine

## 2013-11-04 ENCOUNTER — Ambulatory Visit (INDEPENDENT_AMBULATORY_CARE_PROVIDER_SITE_OTHER): Payer: 59 | Admitting: Emergency Medicine

## 2013-11-04 VITALS — BP 118/70 | HR 84 | Temp 97.7°F | Resp 18 | Ht 66.0 in | Wt 163.6 lb

## 2013-11-04 DIAGNOSIS — R05 Cough: Secondary | ICD-10-CM

## 2013-11-04 DIAGNOSIS — J309 Allergic rhinitis, unspecified: Secondary | ICD-10-CM

## 2013-11-04 DIAGNOSIS — R059 Cough, unspecified: Secondary | ICD-10-CM

## 2013-11-04 MED ORDER — AZITHROMYCIN 250 MG PO TABS: ORAL_TABLET | ORAL | Status: AC

## 2013-11-04 MED ORDER — ALBUTEROL SULFATE HFA 108 (90 BASE) MCG/ACT IN AERS: 2.0000 | INHALATION_SPRAY | Freq: Four times a day (QID) | RESPIRATORY_TRACT | Status: DC | PRN

## 2013-11-04 MED ORDER — PREDNISONE 10 MG PO TABS: ORAL_TABLET | ORAL | Status: DC

## 2013-11-04 NOTE — Progress Notes (Signed)
   Subjective:    Patient ID: [name], female    DOB: 1955-06-13, 58 y.o.   MRN: [id number]  HPI Comments: 58 yo WF exposed to walking pneumonia with husband. She notes she felt worse last weekend but is slowly feeling better. She notes mild sore throat in a.m. She notes cough is dry. She denies fever. She thinks she has cold symptoms but does not feel like it is infection. She has decreased tobacco since cold symptoms but has not restarted allergy medicine.  She had to d/c Crestor due to myalgis. She has not had any myalgias since d/c the prescription. She is trying to eat better and continue to lose weight and improve exercise.      Review of Systems  Respiratory: Positive for cough.   All other systems reviewed and are negative.  BP 118/70  Pulse 84  Temp(Src) 97.7 F (36.5 C)  Resp 18  Ht 5\' 6"  (1.676 m)  Wt 163 lb 9.6 oz (74.208 kg)  BMI 26.42 kg/m2     Objective:   Physical Exam  Nursing note and vitals reviewed. Constitutional: She is oriented to person, place, and time. She appears well-developed and well-nourished.  HENT:  Head: Normocephalic and atraumatic.  Right Ear: External ear normal.  Left Ear: External ear normal.  Nose: Nose normal.  Mouth/Throat: Oropharynx is clear and moist.  Eyes: Conjunctivae and EOM are normal.  Neck: Normal range of motion.  Cardiovascular: Normal rate, regular rhythm, normal heart sounds and intact distal pulses.   Pulmonary/Chest: Effort normal.  Congested Breath sounds, clears some with cough   Musculoskeletal: Normal range of motion.  Lymphadenopathy:    She has no cervical adenopathy.  Neurological: She is alert and oriented to person, place, and time.  Skin: Skin is warm and dry.  Psychiatric: She has a normal mood and affect. Judgment normal.          Assessment & Plan:  1. Cough- Albuterol AD. If symptoms increase ZPAK, Pred DP 10 mg Both AD. Add Mucinex AD, D/C Tobacco  2. Allergic rhinitis- Restart Zyrtec  OTC, increase H2o, allergy hygiene explained.  3. Myalgias- Resolved with d/c Crestor- Trial Livalo 2 mg SX#42, recheck 5 week lab only HFP/ Lipid

## 2013-11-04 NOTE — Patient Instructions (Signed)

## 2013-11-10 ENCOUNTER — Ambulatory Visit: Payer: Self-pay | Admitting: Emergency Medicine

## 2013-12-05 ENCOUNTER — Other Ambulatory Visit: Payer: Self-pay | Admitting: Emergency Medicine

## 2013-12-10 ENCOUNTER — Other Ambulatory Visit: Payer: 59

## 2013-12-10 DIAGNOSIS — E119 Type 2 diabetes mellitus without complications: Secondary | ICD-10-CM

## 2013-12-10 LAB — HEPATIC FUNCTION PANEL
ALBUMIN: 4.5 g/dL (ref 3.5–5.2)
ALT: 14 U/L (ref 0–35)
AST: 12 U/L (ref 0–37)
Alkaline Phosphatase: 111 U/L (ref 39–117)
Bilirubin, Direct: 0.1 mg/dL (ref 0.0–0.3)
Indirect Bilirubin: 0.5 mg/dL (ref 0.2–1.2)
Total Bilirubin: 0.6 mg/dL (ref 0.2–1.2)
Total Protein: 7.4 g/dL (ref 6.0–8.3)

## 2013-12-10 LAB — BASIC METABOLIC PANEL WITH GFR
BUN: 15 mg/dL (ref 6–23)
CALCIUM: 9.7 mg/dL (ref 8.4–10.5)
CO2: 25 mEq/L (ref 19–32)
CREATININE: 0.72 mg/dL (ref 0.50–1.10)
Chloride: 97 mEq/L (ref 96–112)
GFR, Est African American: >89 mL/min
Glucose, Bld: 229 mg/dL — ABNORMAL HIGH (ref 70–99)
Potassium: 4.1 mEq/L (ref 3.5–5.3)
Sodium: 137 mEq/L (ref 135–145)

## 2013-12-30 ENCOUNTER — Other Ambulatory Visit: Payer: Self-pay | Admitting: *Deleted

## 2013-12-30 ENCOUNTER — Other Ambulatory Visit: Payer: Self-pay | Admitting: Internal Medicine

## 2013-12-30 MED ORDER — PITAVASTATIN CALCIUM 2 MG PO TABS: 2.0000 mg | ORAL_TABLET | Freq: Every day | ORAL | Status: DC

## 2014-01-07 ENCOUNTER — Encounter: Payer: Self-pay | Admitting: Emergency Medicine

## 2014-01-07 ENCOUNTER — Ambulatory Visit (INDEPENDENT_AMBULATORY_CARE_PROVIDER_SITE_OTHER): Payer: 59 | Admitting: Emergency Medicine

## 2014-01-07 VITALS — BP 102/68 | HR 90 | Temp 98.4°F | Resp 18 | Ht 66.0 in | Wt 166.0 lb

## 2014-01-07 DIAGNOSIS — E782 Mixed hyperlipidemia: Secondary | ICD-10-CM

## 2014-01-07 DIAGNOSIS — E119 Type 2 diabetes mellitus without complications: Secondary | ICD-10-CM

## 2014-01-07 DIAGNOSIS — Z79899 Other long term (current) drug therapy: Secondary | ICD-10-CM

## 2014-01-07 LAB — HEMOGLOBIN A1C
Hgb A1c MFr Bld: 11.6 % — ABNORMAL HIGH (ref <5.7)
Mean Plasma Glucose: 286 mg/dL — ABNORMAL HIGH (ref <117)

## 2014-01-07 MED ORDER — DULAGLUTIDE 0.75 MG/0.5ML ~~LOC~~ SOAJ: SUBCUTANEOUS | Status: DC

## 2014-01-07 NOTE — Progress Notes (Signed)
Subjective:    Patient ID: [name], female    DOB: 1955/07/11, 58 y.o.   MRN: [id number]  HPI Comments: 58 yo WF presents for 3 month F/U for HTN, Cholesterol, DM, D. Deficient. She is not eating as healthy. She is exercising routinely.  She stopped jardiance x 2 months because of cramping. She notes BS was 150-170s before d/c she notes it is high now  but is still on MF. She has been on Januvia and Glimepiride in past. She has lost weight with Jardiance.   LAST A1c 9.4 to 12.2     Medication List       This list is accurate as of: 01/07/14  1:40 PM.  Always use your most recent med list.               albuterol 108 (90 BASE) MCG/ACT inhaler  Commonly known as:  PROVENTIL HFA;VENTOLIN HFA  Inhale 2 puffs into the lungs every 6 (six) hours as needed for wheezing or shortness of breath.     CALCIUM 500 PO  Take 500 mg by mouth every other day.     Fish Oil 500 MG Caps  Take 500 mg by mouth daily.     metFORMIN 500 MG 24 hr tablet  Commonly known as:  GLUCOPHAGE XR  Take 2 pills BID     multivitamins ther. w/minerals Tabs tablet  Take 1 tablet by mouth daily.     NEXIUM 40 MG capsule  Generic drug:  esomeprazole  TAKE 1 CAPSULE (40 MG TOTAL) BY MOUTH DAILY.     OVER THE COUNTER MEDICATION  Take 1 tablet by mouth 2 (two) times daily. PATIENT TAKES CINNAMON TABLETS 1000mg      Pitavastatin Calcium 2 MG Tabs  Commonly known as:  LIVALO  Take 1 tablet (2 mg total) by mouth daily.       Allergies  Allergen Reactions  . Invokana [Canagliflozin]     Skin peelin  . Jardiance [Empagliflozin]     Myalgias   Past Medical History  Diagnosis Date  . Complication of anesthesia     woke during surgery (ablation) 2006?  Marland Kitchen Blood transfusion     1984   [place]  . Chronic kidney disease     occasional renal stone  . Hypercholesteremia     on meds  . Diabetes mellitus     type 2 Diab- A1C-7  . GERD (gastroesophageal reflux disease)     occasional Protonix-will take  protonix night before surgery     Review of Systems  All other systems reviewed and are negative.  BP 102/68  Pulse 90  Temp(Src) 98.4 F (36.9 C) (Temporal)  Resp 18  Ht 5\' 6"  (1.676 m)  Wt 166 lb (75.297 kg)  BMI 26.81 kg/m2     Objective:   Physical Exam  Nursing note and vitals reviewed. Constitutional: She is oriented to person, place, and time. She appears well-developed and well-nourished. No distress.  HENT:  Head: Normocephalic and atraumatic.  Right Ear: External ear normal.  Left Ear: External ear normal.  Nose: Nose normal.  Mouth/Throat: Oropharynx is clear and moist.  Eyes: Conjunctivae and EOM are normal.  Neck: Normal range of motion. Neck supple. No JVD present. No thyromegaly present.  Cardiovascular: Normal rate, regular rhythm, normal heart sounds and intact distal pulses.   Pulmonary/Chest: Effort normal and breath sounds normal.  Abdominal: Soft. Bowel sounds are normal. She exhibits no distension. There is no tenderness.  Musculoskeletal:  Normal range of motion. She exhibits no edema and no tenderness.  Lymphadenopathy:    She has no cervical adenopathy.  Neurological: She is alert and oriented to person, place, and time. No cranial nerve deficit.  Skin: Skin is warm and dry. No rash noted. No erythema. No pallor.  Psychiatric: She has a normal mood and affect. Her behavior is normal. Judgment and thought content normal.        Assessment & Plan:  1.  3 month F/U for Cholesterol,DM, D. Deficient. Needs healthy diet, cardio QD and obtain healthy weight. Check Labs, Check BP if >130/80 call office, Check BS if >200 call office. Trulicity .75 Start 1 qwk then increase to 1.5 SX given x 3 boxes total of .75 1 box 1.5. Call with results

## 2014-01-07 NOTE — Patient Instructions (Signed)
Diabetes and Exercise Exercising regularly is important. It is not just about losing weight. It has many health benefits, such as:  Improving your overall fitness, flexibility, and endurance.  Increasing your bone density.  Helping with weight control.  Decreasing your body fat.  Increasing your muscle strength.  Reducing stress and tension.  Improving your overall health. People with diabetes who exercise gain additional benefits because exercise:  Reduces appetite.  Improves the body's use of blood sugar (glucose).  Helps lower or control blood glucose.  Decreases blood pressure.  Helps control blood lipids (such as cholesterol and triglycerides).  Improves the body's use of the hormone insulin by:  Increasing the body's insulin sensitivity.  Reducing the body's insulin needs.  Decreases the risk for heart disease because exercising:  Lowers cholesterol and triglycerides levels.  Increases the levels of good cholesterol (such as high-density lipoproteins [HDL]) in the body.  Lowers blood glucose levels. YOUR ACTIVITY PLAN  Choose an activity that you enjoy and set realistic goals. Your health care provider or diabetes educator can help you make an activity plan that works for you. Exercise regularly as directed by your health care provider. This includes:  Performing resistance training twice a week such as push-ups, sit-ups, lifting weights, or using resistance bands.  Performing 150 minutes of cardio exercises each week such as walking, running, or playing sports.  Staying active and spending no more than 90 minutes at one time being inactive. Even short bursts of exercise are good for you. Three 10-minute sessions spread throughout the day are just as beneficial as a single 30-minute session. Some exercise ideas include:  Taking the dog for a walk.  Taking the stairs instead of the elevator.  Dancing to your favorite song.  Doing an exercise  video.  Doing your favorite exercise with a friend. RECOMMENDATIONS FOR EXERCISING WITH TYPE 1 OR TYPE 2 DIABETES   Check your blood glucose before exercising. If blood glucose levels are greater than 240 mg/dL, check for urine ketones. Do not exercise if ketones are present.  Avoid injecting insulin into areas of the body that are going to be exercised. For example, avoid injecting insulin into:  The arms when playing tennis.  The legs when jogging.  Keep a record of:  Food intake before and after you exercise.  Expected peak times of insulin action.  Blood glucose levels before and after you exercise.  The type and amount of exercise you have done.  Review your records with your health care provider. Your health care provider will help you to develop guidelines for adjusting food intake and insulin amounts before and after exercising.  If you take insulin or oral hypoglycemic agents, watch for signs and symptoms of hypoglycemia. They include:  Dizziness.  Shaking.  Sweating.  Chills.  Confusion.  Drink plenty of water while you exercise to prevent dehydration or heat stroke. Body water is lost during exercise and must be replaced.  Talk to your health care provider before starting an exercise program to make sure it is safe for you. Remember, almost any type of activity is better than none. Document Released: 07/14/2003 Document Revised: 09/07/2013 Document Reviewed: 09/30/2012 Exit[employer] Patient Information 2015 [employer]. This information is not intended to replace advice given to you by your health care provider. Make sure you discuss any questions you have with your health care provider.  

## 2014-01-08 LAB — CBC WITH DIFFERENTIAL/PLATELET
Basophils Absolute: 0 10*3/uL (ref 0.0–0.1)
Basophils Relative: 0 % (ref 0–1)
EOS ABS: 0.1 10*3/uL (ref 0.0–0.7)
EOS PCT: 1 % (ref 0–5)
HCT: 42.5 % (ref 36.0–46.0)
HEMOGLOBIN: 14 g/dL (ref 12.0–15.0)
LYMPHS ABS: 3.1 10*3/uL (ref 0.7–4.0)
Lymphocytes Relative: 36 % (ref 12–46)
MCH: 29.2 pg (ref 26.0–34.0)
MCHC: 32.9 g/dL (ref 30.0–36.0)
MCV: 88.7 fL (ref 78.0–100.0)
MONO ABS: 0.6 10*3/uL (ref 0.1–1.0)
MONOS PCT: 7 % (ref 3–12)
NEUTROS PCT: 56 % (ref 43–77)
Neutro Abs: 4.9 10*3/uL (ref 1.7–7.7)
Platelets: 238 10*3/uL (ref 150–400)
RBC: 4.79 MIL/uL (ref 3.87–5.11)
RDW: 13 % (ref 11.5–15.5)
WBC: 8.7 10*3/uL (ref 4.0–10.5)

## 2014-01-08 LAB — HEPATIC FUNCTION PANEL
ALBUMIN: 4.3 g/dL (ref 3.5–5.2)
ALT: 20 U/L (ref 0–35)
AST: 15 U/L (ref 0–37)
Alkaline Phosphatase: 109 U/L (ref 39–117)
BILIRUBIN TOTAL: 0.4 mg/dL (ref 0.2–1.2)
Bilirubin, Direct: 0.1 mg/dL (ref 0.0–0.3)
Indirect Bilirubin: 0.3 mg/dL (ref 0.2–1.2)
TOTAL PROTEIN: 6.8 g/dL (ref 6.0–8.3)

## 2014-01-08 LAB — LIPID PANEL
CHOL/HDL RATIO: 4.3 ratio
Cholesterol: 217 mg/dL — ABNORMAL HIGH (ref 0–200)
HDL: 50 mg/dL (ref >39)
TRIGLYCERIDES: 647 mg/dL — AB (ref <150)

## 2014-01-08 LAB — BASIC METABOLIC PANEL WITH GFR
BUN: 15 mg/dL (ref 6–23)
CALCIUM: 9.4 mg/dL (ref 8.4–10.5)
CO2: 27 meq/L (ref 19–32)
CREATININE: 0.65 mg/dL (ref 0.50–1.10)
Chloride: 96 mEq/L (ref 96–112)
GFR, Est African American: >89 mL/min
GFR, Est Non African American: >89 mL/min
GLUCOSE: 284 mg/dL — AB (ref 70–99)
Potassium: 4 mEq/L (ref 3.5–5.3)
Sodium: 135 mEq/L (ref 135–145)

## 2014-01-08 LAB — INSULIN, FASTING: Insulin fasting, serum: 8.7 u[IU]/mL (ref 2.0–19.6)

## 2014-01-20 ENCOUNTER — Encounter: Payer: Self-pay | Admitting: Emergency Medicine

## 2014-02-05 ENCOUNTER — Other Ambulatory Visit: Payer: Self-pay | Admitting: Internal Medicine

## 2014-02-17 ENCOUNTER — Encounter: Payer: Self-pay | Admitting: Emergency Medicine

## 2014-02-19 ENCOUNTER — Encounter: Payer: Self-pay | Admitting: Emergency Medicine

## 2014-02-23 ENCOUNTER — Emergency Department (HOSPITAL_COMMUNITY)
Admission: EM | Admit: 2014-02-23 | Discharge: 2014-02-23 | Disposition: A | Discharge status: Home / Self Care | Payer: 59 | Source: Home / Self Care | Attending: Family Medicine | Admitting: Family Medicine

## 2014-02-23 ENCOUNTER — Encounter (HOSPITAL_COMMUNITY): Payer: Self-pay | Admitting: Emergency Medicine

## 2014-02-23 DIAGNOSIS — J00 Acute nasopharyngitis [common cold]: Secondary | ICD-10-CM

## 2014-02-23 MED ORDER — IPRATROPIUM BROMIDE 0.06 % NA SOLN: 2.0000 | Freq: Four times a day (QID) | NASAL | Status: DC

## 2014-02-23 MED ORDER — BENZONATATE 100 MG PO CAPS: 100.0000 mg | ORAL_CAPSULE | Freq: Three times a day (TID) | ORAL | Status: DC | PRN

## 2014-02-23 NOTE — ED Provider Notes (Signed)
CSN: [id number]     Arrival date & time 02/23/14  0810 History   First MD Initiated Contact with Patient 02/23/14 [phone]     Chief Complaint  Patient presents with  . URI   (Consider location/radiation/quality/duration/timing/severity/associated sxs/prior Treatment) Patient is a 58 y.o. female presenting with URI. The history is provided by the patient.  URI Presenting symptoms: congestion, cough, fatigue and rhinorrhea   Presenting symptoms: no ear pain, no facial pain, no fever and no sore throat   Severity:  Moderate Onset quality:  Gradual Duration:  5 days Timing:  Constant Progression:  Unchanged Chronicity:  New   Past Medical History  Diagnosis Date  . Complication of anesthesia     woke during surgery (ablation) 2006?  Marland Kitchen Blood transfusion     1984   [place]  . Chronic kidney disease     occasional renal stone  . Hypercholesteremia     on meds  . Diabetes mellitus     type 2 Diab- A1C-7  . GERD (gastroesophageal reflux disease)     occasional Protonix-will take protonix night before surgery   Past Surgical History  Procedure Laterality Date  . Uterine ablation    . Cervical disc surgery  2004  . Transvaginal tape  8/12  . Bladder suspension  12/08/2010    Procedure: TRANSVAGINAL TAPE (TVT) PROCEDURE;  Surgeon: Arloa Koh;  Location: [facility];  Service: Gynecology;  Laterality: N/A;  transvaginal tape with cystoscopy  . Bladder suspension  01/30/2011    Procedure: TRANSVAGINAL TAPE (TVT) PROCEDURE;  Surgeon: Arloa Koh;  Location: [facility];  Service: Gynecology;  Laterality: N/A;  excision of excess sling from suprapubic incision and cystoscopy  . Cystoscopy  01/30/2011    Procedure: CYSTOSCOPY;  Surgeon: Arloa Koh;  Location: [facility];  Service: Gynecology;  Laterality: N/A;   Family History  Problem Relation Age of Onset  . Hypertension Mother   . Hyperlipidemia Mother   . Heart disease Mother   . Heart disease Father   . Hyperlipidemia Father   . Diabetes  Father   . Cancer Paternal Aunt     lung/ brain   History  Substance Use Topics  . Smoking status: Current Every Day Smoker -- 1.00 packs/day  . Smokeless tobacco: Never Used  . Alcohol Use: No   OB History   Grav Para Term Preterm Abortions TAB SAB Ect Mult Living                 Review of Systems  Constitutional: Positive for fatigue. Negative for fever.  HENT: Positive for congestion and rhinorrhea. Negative for ear pain and sore throat.   Respiratory: Positive for cough.   All other systems reviewed and are negative.   Allergies  Invokana and Jardiance  Home Medications   Prior to Admission medications   Medication Sig Start Date End Date Taking? Authorizing Provider  SitaGLIPtin-MetFORMIN HCl (JANUMET PO) Take by mouth.   Yes Historical Provider, MD  albuterol (PROVENTIL HFA;VENTOLIN HFA) 108 (90 BASE) MCG/ACT inhaler Inhale 2 puffs into the lungs every 6 (six) hours as needed for wheezing or shortness of breath. 11/04/13   Melissa R Smith, PA-C  benzonatate (TESSALON) 100 MG capsule Take 1 capsule (100 mg total) by mouth 3 (three) times daily as needed for cough. 02/23/14   Audelia Hives Shawntia Mangal, PA  Calcium-Magnesium-Vitamin D (CALCIUM 500 PO) Take 500 mg by mouth every other day.    Historical Provider, MD  Dulaglutide (TRULICITY)  0.75 MG/0.5ML SOPN 1 injection per week 01/07/14   Melissa R Smith, PA-C  ipratropium (ATROVENT) 0.06 % nasal spray Place 2 sprays into both nostrils 4 (four) times daily. As needed for nasal congestion 02/23/14   Lutricia Feil, PA  metFORMIN (GLUCOPHAGE XR) 500 MG 24 hr tablet Take 2 pills BID 10/07/13 10/08/14  Ardis Hughs, PA-C  Multiple Vitamins-Minerals (MULTIVITAMINS THER. W/MINERALS) TABS Take 1 tablet by mouth daily.      Historical Provider, MD  NEXIUM 40 MG capsule TAKE 1 CAPSULE (40 MG TOTAL) BY MOUTH DAILY. 02/05/14   Unk Pinto, MD  Omega-3 Fatty Acids (FISH OIL) 500 MG CAPS Take 500 mg by mouth daily.    Historical  Provider, MD  OVER THE COUNTER MEDICATION Take 1 tablet by mouth 2 (two) times daily. PATIENT TAKES CINNAMON TABLETS 1000mg     Historical Provider, MD  Pitavastatin Calcium (LIVALO) 2 MG TABS Take 1 tablet (2 mg total) by mouth daily. 12/30/13   Unk Pinto, MD   BP 122/69  Pulse 83  Temp(Src) 97.9 F (36.6 C) (Oral)  Resp 16  SpO2 95% Physical Exam  Nursing note and vitals reviewed. Constitutional: She is oriented to person, place, and time. She appears well-developed and well-nourished. No distress.  HENT:  Head: Normocephalic and atraumatic.  Right Ear: Hearing, tympanic membrane, external ear and ear canal normal.  Left Ear: Hearing, tympanic membrane, external ear and ear canal normal.  Nose: Mucosal edema and rhinorrhea present.  Mouth/Throat: Uvula is midline, oropharynx is clear and moist and mucous membranes are normal.  Eyes: Conjunctivae are normal. No scleral icterus.  Neck: Normal range of motion. Neck supple.  Cardiovascular: Normal rate, regular rhythm and normal heart sounds.   Pulmonary/Chest: Effort normal and breath sounds normal. No respiratory distress. She has no wheezes.  Musculoskeletal: Normal range of motion.  Lymphadenopathy:    She has no cervical adenopathy.  Neurological: She is alert and oriented to person, place, and time.  Skin: Skin is warm and dry. No rash noted. No erythema.  Psychiatric: She has a normal mood and affect. Her behavior is normal.    ED Course  Procedures (including critical care time) Labs Review Labs Reviewed - No data to display  Imaging Review No results found.   MDM   1. Common cold    Common cold Symptomatic care at home Atrovent nasal spray Tessalon PCP follow up if no improvement over next 7-10 days   Lutricia Feil, [place] 02/23/14 [phone]

## 2014-02-23 NOTE — Discharge Instructions (Signed)
Please try to discontinue smoking. Expect cough to last another 7-10 days  Upper Respiratory Infection, Adult An upper respiratory infection (URI) is also sometimes known as the common cold. The upper respiratory tract includes the nose, sinuses, throat, trachea, and bronchi. Bronchi are the airways leading to the lungs. Most people improve within 1 week, but symptoms can last up to 2 weeks. A residual cough may last even longer.  CAUSES Many different viruses can infect the tissues lining the upper respiratory tract. The tissues become irritated and inflamed and often become very moist. Mucus production is also common. A cold is contagious. You can easily spread the virus to others by oral contact. This includes kissing, sharing a glass, coughing, or sneezing. Touching your mouth or nose and then touching a surface, which is then touched by another person, can also spread the virus. SYMPTOMS  Symptoms typically develop 1 to 3 days after you come in contact with a cold virus. Symptoms vary from person to person. They may include:  Runny nose.  Sneezing.  Nasal congestion.  Sinus irritation.  Sore throat.  Loss of voice (laryngitis).  Cough.  Fatigue.  Muscle aches.  Loss of appetite.  Headache.  Low-grade fever. DIAGNOSIS  You might diagnose your own cold based on familiar symptoms, since most people get a cold 2 to 3 times a year. Your caregiver can confirm this based on your exam. Most importantly, your caregiver can check that your symptoms are not due to another disease such as strep throat, sinusitis, pneumonia, asthma, or epiglottitis. Blood tests, throat tests, and X-rays are not necessary to diagnose a common cold, but they may sometimes be helpful in excluding other more serious diseases. Your caregiver will decide if any further tests are required. RISKS AND COMPLICATIONS  You may be at risk for a more severe case of the common cold if you smoke cigarettes, have chronic  heart disease (such as heart failure) or lung disease (such as asthma), or if you have a weakened immune system. The very young and very old are also at risk for more serious infections. Bacterial sinusitis, middle ear infections, and bacterial pneumonia can complicate the common cold. The common cold can worsen asthma and chronic obstructive pulmonary disease (COPD). Sometimes, these complications can require emergency medical care and may be life-threatening. PREVENTION  The best way to protect against getting a cold is to practice good hygiene. Avoid oral or hand contact with people with cold symptoms. Wash your hands often if contact occurs. There is no clear evidence that vitamin C, vitamin E, echinacea, or exercise reduces the chance of developing a cold. However, it is always recommended to get plenty of rest and practice good nutrition. TREATMENT  Treatment is directed at relieving symptoms. There is no cure. Antibiotics are not effective, because the infection is caused by a virus, not by bacteria. Treatment may include:  Increased fluid intake. Sports drinks offer valuable electrolytes, sugars, and fluids.  Breathing heated mist or steam (vaporizer or shower).  Eating chicken soup or other clear broths, and maintaining good nutrition.  Getting plenty of rest.  Using gargles or lozenges for comfort.  Controlling fevers with ibuprofen or acetaminophen as directed by your caregiver.  Increasing usage of your inhaler if you have asthma. Zinc gel and zinc lozenges, taken in the first 24 hours of the common cold, can shorten the duration and lessen the severity of symptoms. Pain medicines may help with fever, muscle aches, and throat pain. A variety  of non-prescription medicines are available to treat congestion and runny nose. Your caregiver can make recommendations and may suggest nasal or lung inhalers for other symptoms.  HOME CARE INSTRUCTIONS   Only take over-the-counter or  prescription medicines for pain, discomfort, or fever as directed by your caregiver.  Use a warm mist humidifier or inhale steam from a shower to increase air moisture. This may keep secretions moist and make it easier to breathe.  Drink enough water and fluids to keep your urine clear or pale yellow.  Rest as needed.  Return to work when your temperature has returned to normal or as your caregiver advises. You may need to stay home longer to avoid infecting others. You can also use a face mask and careful hand washing to prevent spread of the virus. SEEK MEDICAL CARE IF:   After the first few days, you feel you are getting worse rather than better.  You need your caregiver's advice about medicines to control symptoms.  You develop chills, worsening shortness of breath, or brown or red sputum. These may be signs of pneumonia.  You develop yellow or brown nasal discharge or pain in the face, especially when you bend forward. These may be signs of sinusitis.  You develop a fever, swollen neck glands, pain with swallowing, or white areas in the back of your throat. These may be signs of strep throat. SEEK IMMEDIATE MEDICAL CARE IF:   You have a fever.  You develop severe or persistent headache, ear pain, sinus pain, or chest pain.  You develop wheezing, a prolonged cough, cough up blood, or have a change in your usual mucus (if you have chronic lung disease).  You develop sore muscles or a stiff neck. Document Released: 10/17/2000 Document Revised: 07/16/2011 Document Reviewed: 07/29/2013 [facility] Patient Information 2015 [place], [place]. This information is not intended to replace advice given to you by your health care provider. Make sure you discuss any questions you have with your health care provider.

## 2014-02-23 NOTE — ED Notes (Signed)
Reports vomiting ,diarrhea and fever over the weekend. Monday and today has had sinus congestion and blood from sinus, reports chest was tight, but now is not and feels like it is "breaking up"

## 2014-02-24 NOTE — ED Provider Notes (Signed)
Medical screening examination/treatment/procedure(s) were performed by resident physician or non-physician practitioner and as supervising physician I was immediately available for consultation/collaboration.   Pauline Good MD.   Billy Fischer, MD 02/24/14 2031

## 2014-03-22 ENCOUNTER — Encounter: Payer: 59 | Admitting: Internal Medicine

## 2014-03-26 ENCOUNTER — Encounter: Payer: Self-pay | Admitting: Internal Medicine

## 2014-03-31 ENCOUNTER — Other Ambulatory Visit: Payer: Self-pay

## 2014-03-31 MED ORDER — PANTOPRAZOLE SODIUM 40 MG PO TBEC: 40.0000 mg | DELAYED_RELEASE_TABLET | Freq: Every day | ORAL | Status: DC

## 2014-04-05 ENCOUNTER — Other Ambulatory Visit: Payer: Self-pay | Admitting: *Deleted

## 2014-04-05 MED ORDER — ESOMEPRAZOLE MAGNESIUM 40 MG PO CPDR: DELAYED_RELEASE_CAPSULE | ORAL | Status: DC

## 2014-04-13 ENCOUNTER — Other Ambulatory Visit: Payer: Self-pay

## 2014-04-13 MED ORDER — PITAVASTATIN CALCIUM 2 MG PO TABS
2.0000 mg | ORAL_TABLET | Freq: Every day | ORAL | Status: DC
Start: 2014-04-13 — End: 2014-05-09

## 2014-04-13 NOTE — Telephone Encounter (Signed)
RX filled #30 , patient appears to be scheduled with [facility] on 04-19-14 to establish new PCP Care

## 2014-04-19 ENCOUNTER — Ambulatory Visit (INDEPENDENT_AMBULATORY_CARE_PROVIDER_SITE_OTHER): Payer: 59 | Admitting: Internal Medicine

## 2014-04-19 ENCOUNTER — Encounter: Payer: Self-pay | Admitting: Internal Medicine

## 2014-04-19 VITALS — BP 110/64 | HR 78 | Temp 97.8°F | Ht 65.33 in | Wt 175.0 lb

## 2014-04-19 DIAGNOSIS — E1165 Type 2 diabetes mellitus with hyperglycemia: Secondary | ICD-10-CM

## 2014-04-19 DIAGNOSIS — K219 Gastro-esophageal reflux disease without esophagitis: Secondary | ICD-10-CM

## 2014-04-19 DIAGNOSIS — E785 Hyperlipidemia, unspecified: Secondary | ICD-10-CM

## 2014-04-19 DIAGNOSIS — E7800 Pure hypercholesterolemia, unspecified: Secondary | ICD-10-CM

## 2014-04-19 NOTE — Assessment & Plan Note (Signed)
Will repeat A1C and microalbumin today Flu and Pneumovax UTD Foot exam today Eye exam UTD Continue to follow with endocrinology

## 2014-04-19 NOTE — Patient Instructions (Signed)

## 2014-04-19 NOTE — Assessment & Plan Note (Signed)
Will repeat lipid profile and CMET today Continue Livalo, Tricor and Fish oil

## 2014-04-19 NOTE — Progress Notes (Signed)
Pre visit review using our clinic review tool, if applicable. No additional management support is needed unless otherwise documented below in the visit note. 

## 2014-04-19 NOTE — Assessment & Plan Note (Signed)
Well controlled on Nexium Will check CBC and CMET today

## 2014-04-19 NOTE — Progress Notes (Signed)
HPI  Pt presents to the clinic today to establish care. She is transferring care from [place], [place] in [place].  HLD: Lipid profile 01/2014 reviewed. Total 217, HDL 50, LDL not calculated d/t Triglycerides of 647. She is on Tricor, Livalo, Fish Oil and taking as prescribed.  DM2: A1C 01/2014 reviewed- 11.7%. Her fasting sugars are less than 130. She is on Amaryl, Janument. Her pneumonia vaccine was 2011. Her last eye exam was 07/2013. Foot exam 07/2013. She is seeing endocrinology.  GERD: Symptoms well controlled on Nexium.  Flu:  02/2014 Tetanus: 2013 Pneumovax: 2011 LMP: ablation Pap Smear: 10/2013 Mammogram: 07/2013 Colon Screening: 01/2014 (10 years) Vision Screening: 07/2013 Dentist: every 3 months  Past Medical History  Diagnosis Date  . Complication of anesthesia     woke during surgery (ablation) 2006?  Marland Kitchen Blood transfusion     1984   [place]  . Chronic kidney disease     occasional renal stone  . Hypercholesteremia     on meds  . Diabetes mellitus     type 2 Diab- A1C-7  . GERD (gastroesophageal reflux disease)     occasional Protonix-will take protonix night before surgery    Current Outpatient Prescriptions  Medication Sig Dispense Refill  . albuterol (PROVENTIL HFA;VENTOLIN HFA) 108 (90 BASE) MCG/ACT inhaler Inhale 2 puffs into the lungs every 6 (six) hours as needed for wheezing or shortness of breath. 1 Inhaler 2  . Calcium-Magnesium-Vitamin D (CALCIUM 500 PO) Take 500 mg by mouth every other day.    . Dulaglutide (TRULICITY) 1.61 WR/6.0AV SOPN 1 injection per week 0.5 mL 5  . esomeprazole (NEXIUM) 40 MG capsule TAKE 1 CAPSULE (40 MG TOTAL) BY MOUTH DAILY. 30 capsule 3  . fenofibrate (TRICOR) 145 MG tablet Take 145 mg by mouth daily.  6  . glimepiride (AMARYL) 2 MG tablet Take 2 mg by mouth daily with breakfast.  12  . ipratropium (ATROVENT) 0.06 % nasal spray Place 2 sprays into both nostrils 4 (four) times daily. As needed for nasal congestion 15 mL 0  .  JANUMET XR 50-1000 MG TB24 Take 1 tablet by mouth 2 (two) times daily.  12  . metFORMIN (GLUCOPHAGE XR) 500 MG 24 hr tablet Take 2 pills BID 360 tablet 2  . Multiple Vitamins-Minerals (MULTIVITAMINS THER. W/MINERALS) TABS Take 1 tablet by mouth daily.      . Omega-3 Fatty Acids (FISH OIL) 500 MG CAPS Take 500 mg by mouth daily.    Marland Kitchen OVER THE COUNTER MEDICATION Take 1 tablet by mouth 2 (two) times daily. PATIENT TAKES CINNAMON TABLETS 1000mg     . pantoprazole (PROTONIX) 40 MG tablet Take 1 tablet (40 mg total) by mouth daily. 90 tablet 3  . Pitavastatin Calcium (LIVALO) 2 MG TABS Take 1 tablet (2 mg total) by mouth daily. 30 tablet 0   No current facility-administered medications for this visit.    Allergies  Allergen Reactions  . Invokana [Canagliflozin]     Skin peelin  . Jardiance [Empagliflozin]     Myalgias    Family History  Problem Relation Age of Onset  . Hypertension Mother   . Hyperlipidemia Mother   . Heart disease Mother   . Heart disease Father   . Hyperlipidemia Father   . Diabetes Father   . Cancer Paternal Aunt     lung/ brain    History   Social History  . Marital Status: Married    Spouse Name: N/A    Number of Children:  N/A  . Years of Education: N/A   Occupational History  . Not on file.   Social History Main Topics  . Smoking status: Current Every Day Smoker -- 1.00 packs/day  . Smokeless tobacco: Never Used  . Alcohol Use: No  . Drug Use: No  . Sexual Activity: Not on file   Other Topics Concern  . Not on file   Social History Narrative    ROS:  Constitutional: Denies fever, malaise, fatigue, headache or abrupt weight changes.  HEENT: Denies eye pain, eye redness, ear pain, ringing in the ears, wax buildup, runny nose, nasal congestion, bloody nose, or sore throat. Respiratory: Denies difficulty breathing, shortness of breath, cough or sputum production.   Cardiovascular: Denies chest pain, chest tightness, palpitations or swelling in  the hands or feet.  Gastrointestinal: Denies abdominal pain, bloating, constipation, diarrhea or blood in the stool.  GU: Denies frequency, urgency, pain with urination, blood in urine, odor or discharge. Musculoskeletal: Denies decrease in range of motion, difficulty with gait, muscle pain or joint pain and swelling.  Skin: Denies redness, rashes, lesions or ulcercations.  Neurological: Denies dizziness, difficulty with memory, difficulty with speech or problems with balance and coordination.   No other specific complaints in a complete review of systems (except as listed in HPI above).  PE:  BP 110/64 mmHg  Pulse 78  Temp(Src) 97.8 F (36.6 C) (Oral)  Ht 5' 5.33" (1.659 m)  Wt 175 lb (79.379 kg)  BMI 28.84 kg/m2  SpO2 98%  Wt Readings from Last 3 Encounters:  04/19/14 175 lb (79.379 kg)  01/07/14 166 lb (75.297 kg)  11/04/13 163 lb 9.6 oz (74.208 kg)    General: Appears her stated age, well developed, well nourished in NAD. Skin: Warm, dry and intact. No lesions noted. Cardiovascular: Normal rate and rhythm. S1,S2 noted.  No murmur, rubs or gallops noted. No JVD or BLE edema. No carotid bruits noted. Pulmonary/Chest: Normal effort and positive vesicular breath sounds. No respiratory distress. No wheezes, rales or ronchi noted.  Abdomen: Soft and nontender. Normal bowel sounds, no bruits noted. No distention or masses noted. Liver, spleen and kidneys non palpable. Neurological: Alert and oriented. Sensation intact to bilateral feet. Psychiatric: Mood and affect normal. Behavior is normal. Judgment and thought content normal.    BMET    Component Value Date/Time   NA 135 01/07/2014 1402   K 4.0 01/07/2014 1402   CL 96 01/07/2014 1402   CO2 27 01/07/2014 1402   GLUCOSE 284* 01/07/2014 1402   BUN 15 01/07/2014 1402   CREATININE 0.65 01/07/2014 1402   CREATININE 0.80 01/30/2011 0924   CALCIUM 9.4 01/07/2014 1402   GFRNONAA >89 01/07/2014 1402   GFRNONAA >60 01/30/2011  0924   GFRAA >89 01/07/2014 1402   GFRAA >60 01/30/2011 0924    Lipid Panel     Component Value Date/Time   CHOL 217* 01/07/2014 1402   TRIG 647* 01/07/2014 1402   HDL 50 01/07/2014 1402   CHOLHDL 4.3 01/07/2014 1402   VLDL NOT CALC 01/07/2014 1402   LDLCALC NOT CALC 01/07/2014 1402    CBC    Component Value Date/Time   WBC 8.7 01/07/2014 1402   RBC 4.79 01/07/2014 1402   HGB 14.0 01/07/2014 1402   HCT 42.5 01/07/2014 1402   PLT 238 01/07/2014 1402   MCV 88.7 01/07/2014 1402   MCH 29.2 01/07/2014 1402   MCHC 32.9 01/07/2014 1402   RDW 13.0 01/07/2014 1402   LYMPHSABS 3.1 01/07/2014  1402   MONOABS 0.6 01/07/2014 1402   EOSABS 0.1 01/07/2014 1402   BASOSABS 0.0 01/07/2014 1402    Hgb A1C Lab Results  Component Value Date   HGBA1C 11.6* 01/07/2014     Assessment and Plan:

## 2014-04-20 ENCOUNTER — Telehealth: Payer: Self-pay | Admitting: Internal Medicine

## 2014-04-20 LAB — COMPREHENSIVE METABOLIC PANEL
ALBUMIN: 4.2 g/dL (ref 3.5–5.2)
ALT: 29 U/L (ref 0–35)
AST: 23 U/L (ref 0–37)
Alkaline Phosphatase: 61 U/L (ref 39–117)
BUN: 17 mg/dL (ref 6–23)
CALCIUM: 9.3 mg/dL (ref 8.4–10.5)
CHLORIDE: 104 meq/L (ref 96–112)
CO2: 27 mEq/L (ref 19–32)
Creatinine, Ser: 1.1 mg/dL (ref 0.4–1.2)
GFR: 52.9 mL/min — AB (ref >60.00)
GLUCOSE: 191 mg/dL — AB (ref 70–99)
POTASSIUM: 3.9 meq/L (ref 3.5–5.1)
SODIUM: 136 meq/L (ref 135–145)
TOTAL PROTEIN: 7.1 g/dL (ref 6.0–8.3)
Total Bilirubin: 0.3 mg/dL (ref 0.2–1.2)

## 2014-04-20 LAB — LIPID PANEL
CHOLESTEROL: 213 mg/dL — AB (ref 0–200)
HDL: 51 mg/dL (ref >39.00)
NonHDL: 162
Total CHOL/HDL Ratio: 4
Triglycerides: 311 mg/dL — ABNORMAL HIGH (ref 0.0–149.0)
VLDL: 62.2 mg/dL — ABNORMAL HIGH (ref 0.0–40.0)

## 2014-04-20 LAB — CBC
HEMATOCRIT: 37.3 % (ref 36.0–46.0)
Hemoglobin: 12.3 g/dL (ref 12.0–15.0)
MCHC: 33 g/dL (ref 30.0–36.0)
MCV: 90.7 fl (ref 78.0–100.0)
Platelets: 288 10*3/uL (ref 150.0–400.0)
RBC: 4.11 Mil/uL (ref 3.87–5.11)
RDW: 13.6 % (ref 11.5–15.5)
WBC: 10.1 10*3/uL (ref 4.0–10.5)

## 2014-04-20 LAB — LDL CHOLESTEROL, DIRECT: LDL DIRECT: 125.3 mg/dL

## 2014-04-20 LAB — MICROALBUMIN / CREATININE URINE RATIO
CREATININE, U: 183.5 mg/dL
MICROALB UR: 0.8 mg/dL (ref 0.0–1.9)
Microalb Creat Ratio: 0.4 mg/g (ref 0.0–30.0)

## 2014-04-20 LAB — HEMOGLOBIN A1C: Hgb A1c MFr Bld: 8.8 % — ABNORMAL HIGH (ref 4.6–6.5)

## 2014-04-20 NOTE — Telephone Encounter (Signed)
emmi emailed °

## 2014-05-09 ENCOUNTER — Other Ambulatory Visit: Payer: Self-pay | Admitting: Physician Assistant

## 2014-05-17 ENCOUNTER — Encounter: Payer: Self-pay | Admitting: Internal Medicine

## 2014-05-17 ENCOUNTER — Ambulatory Visit (INDEPENDENT_AMBULATORY_CARE_PROVIDER_SITE_OTHER): Payer: 59 | Admitting: Internal Medicine

## 2014-05-17 VITALS — BP 118/64 | HR 91 | Temp 97.8°F | Wt 177.0 lb

## 2014-05-17 DIAGNOSIS — R0789 Other chest pain: Secondary | ICD-10-CM

## 2014-05-17 DIAGNOSIS — R079 Chest pain, unspecified: Secondary | ICD-10-CM

## 2014-05-17 NOTE — Progress Notes (Signed)
Subjective:    Patient ID: [name], female    DOB: 02-19-1956, 59 y.o.   MRN: [id number]  HPI  Pt presents to the clinic with c/o left sided chest pain. She reports this started 3 days ago. She describes the pain as sharp and stabbing. The pain does radiate into her left armpit at times. It does seem worse with movement. She reports this occurred shortly after trying to swallow a fish oil pill. She did have a sensation that the pill had gotten stuck. She did cough vigorously to try to get the pill to come up. She also tried to make herself vomit without relief. She does feel like the pill has finally gone down but she still has this pain in her left chest. She denies dizziness, shortness of breath or pain in her back. She has taken ASA without any relief.  Review of Systems      Past Medical History  Diagnosis Date  . Complication of anesthesia     woke during surgery (ablation) 2006?  Marland Kitchen Blood transfusion     1984   [place]  . Chronic kidney disease     occasional renal stone  . Hypercholesteremia     on meds  . Diabetes mellitus     type 2 Diab- A1C-7  . GERD (gastroesophageal reflux disease)     occasional Protonix-will take protonix night before surgery    Current Outpatient Prescriptions  Medication Sig Dispense Refill  . esomeprazole (NEXIUM) 40 MG capsule TAKE 1 CAPSULE (40 MG TOTAL) BY MOUTH DAILY. 30 capsule 3  . fenofibrate (TRICOR) 145 MG tablet Take 145 mg by mouth daily.  6  . glimepiride (AMARYL) 2 MG tablet Take 2 mg by mouth daily with breakfast.  12  . JANUMET XR 50-1000 MG TB24 Take 1 tablet by mouth 2 (two) times daily.  12  . Multiple Vitamins-Minerals (MULTIVITAMINS THER. W/MINERALS) TABS Take 1 tablet by mouth daily.      . Omega-3 Fatty Acids (FISH OIL) 500 MG CAPS Take 500 mg by mouth daily.    Marland Kitchen OVER THE COUNTER MEDICATION Take 1 tablet by mouth 2 (two) times daily. PATIENT TAKES CINNAMON TABLETS 1000mg     . Pitavastatin Calcium (LIVALO) 2 MG TABS Take  1 tablet daily for cholesterol - need Office Visit before next refill 30 tablet 0   No current facility-administered medications for this visit.    Allergies  Allergen Reactions  . Invokana [Canagliflozin]     Skin peelin  . Jardiance [Empagliflozin]     Myalgias    Family History  Problem Relation Age of Onset  . Hypertension Mother   . Hyperlipidemia Mother   . Heart disease Mother   . Heart disease Father   . Hyperlipidemia Father   . Diabetes Father   . Cancer Paternal Aunt     lung/ brain    History   Social History  . Marital Status: Married    Spouse Name: N/A    Number of Children: N/A  . Years of Education: N/A   Occupational History  . Not on file.   Social History Main Topics  . Smoking status: Current Every Day Smoker -- 1.00 packs/day for 30 years  . Smokeless tobacco: Never Used  . Alcohol Use: No  . Drug Use: No  . Sexual Activity: Yes   Other Topics Concern  . Not on file   Social History Narrative     Constitutional: Denies fever, malaise, fatigue,  headache or abrupt weight changes.  HEENT: Denies eye pain, eye redness, ear pain, ringing in the ears, wax buildup, runny nose, nasal congestion, bloody nose, or sore throat. Respiratory: Denies difficulty breathing, shortness of breath, cough or sputum production.   Cardiovascular: Pt reports chest tightness. Denies chest pain, palpitations or swelling in the hands or feet.  Gastrointestinal: Denies abdominal pain, bloating, constipation, diarrhea or blood in the stool.   Neurological: Denies dizziness, difficulty with memory, difficulty with speech or problems with balance and coordination.   No other specific complaints in a complete review of systems (except as listed in HPI above).  Objective:   Physical Exam   BP 118/64 mmHg  Pulse 91  Temp(Src) 97.8 F (36.6 C) (Oral)  Wt 177 lb (80.287 kg)  SpO2 97% Wt Readings from Last 3 Encounters:  05/17/14 177 lb (80.287 kg)  04/19/14 175  lb (79.379 kg)  01/07/14 166 lb (75.297 kg)    General: Appears her stated age, well developed, well nourished in NAD. Skin: Warm, dry and intact. No rashes, lesions or ulcerations noted. Cardiovascular: Normal rate and rhythm. S1,S2 noted.  No murmur, rubs or gallops noted. No JVD. No carotid bruits noted. Pulmonary/Chest: Normal effort and positive vesicular breath sounds. No respiratory distress. No wheezes, rales or ronchi noted.  Abdomen: Soft and nontender. Normal bowel sounds. Musculoskeletal: Normal internal and external range of motion. Pain reproduced with palpation of the left anterior chest Gerwig.  Neurological: Alert and oriented.    BMET    Component Value Date/Time   NA 136 04/19/2014 1540   K 3.9 04/19/2014 1540   CL 104 04/19/2014 1540   CO2 27 04/19/2014 1540   GLUCOSE 191* 04/19/2014 1540   BUN 17 04/19/2014 1540   CREATININE 1.1 04/19/2014 1540   CREATININE 0.65 01/07/2014 1402   CALCIUM 9.3 04/19/2014 1540   GFRNONAA >89 01/07/2014 1402   GFRNONAA >60 01/30/2011 0924   GFRAA >89 01/07/2014 1402   GFRAA >60 01/30/2011 0924    Lipid Panel     Component Value Date/Time   CHOL 213* 04/19/2014 1540   TRIG 311.0* 04/19/2014 1540   HDL 51.00 04/19/2014 1540   CHOLHDL 4 04/19/2014 1540   VLDL 62.2* 04/19/2014 1540   LDLCALC NOT CALC 01/07/2014 1402    CBC    Component Value Date/Time   WBC 10.1 04/19/2014 1540   RBC 4.11 04/19/2014 1540   HGB 12.3 04/19/2014 1540   HCT 37.3 04/19/2014 1540   PLT 288.0 04/19/2014 1540   MCV 90.7 04/19/2014 1540   MCH 29.2 01/07/2014 1402   MCHC 33.0 04/19/2014 1540   RDW 13.6 04/19/2014 1540   LYMPHSABS 3.1 01/07/2014 1402   MONOABS 0.6 01/07/2014 1402   EOSABS 0.1 01/07/2014 1402   BASOSABS 0.0 01/07/2014 1402    Hgb A1C Lab Results  Component Value Date   HGBA1C 8.8* 04/19/2014        Assessment & Plan:   Left sided chest Russell pain:  MSK in origin ECG: short PR syndrome otherwise normal Advised  her to try some Ibuprofen OTC Exercises given to stretch the pectoralis major  RTC as needed or if symptoms persist or worsen

## 2014-05-17 NOTE — Progress Notes (Signed)
Pre visit review using our clinic review tool, if applicable. No additional management support is needed unless otherwise documented below in the visit note. 

## 2014-05-17 NOTE — Patient Instructions (Signed)
Chest [place] Pain °Chest [place] pain is pain in or around the bones and muscles of your chest. It may take up to 6 weeks to get better. It may take longer if you must stay physically active in your work and activities.  °CAUSES  °Chest [place] pain may happen on its own. However, it may be caused by: °· A viral illness like the flu. °· Injury. °· Coughing. °· Exercise. °· Arthritis. °· Fibromyalgia. °· Shingles. °HOME CARE INSTRUCTIONS  °· Avoid overtiring physical activity. Try not to strain or perform activities that cause pain. This includes any activities using your chest or your abdominal and side muscles, especially if heavy weights are used. °· Put ice on the sore area. °¨ Put ice in a plastic bag. °¨ Place a towel between your skin and the bag. °¨ Leave the ice on for 15-20 minutes per hour while awake for the first 2 days. °· Only take over-the-counter or prescription medicines for pain, discomfort, or fever as directed by your caregiver. °SEEK IMMEDIATE MEDICAL CARE IF:  °· Your pain increases, or you are very uncomfortable. °· You have a fever. °· Your chest pain becomes worse. °· You have new, unexplained symptoms. °· You have nausea or vomiting. °· You feel sweaty or lightheaded. °· You have a cough with phlegm (sputum), or you cough up blood. °MAKE SURE YOU:  °· Understand these instructions. °· Will watch your condition. °· Will get help right away if you are not doing well or get worse. °Document Released: 04/23/2005 Document Revised: 07/16/2011 Document Reviewed: 12/18/2010 °[employer]® Patient Information ©2015 [employer]. This information is not intended to replace advice given to you by your health care provider. Make sure you discuss any questions you have with your health care provider. ° °

## 2014-06-05 ENCOUNTER — Other Ambulatory Visit: Payer: Self-pay | Admitting: Internal Medicine

## 2014-06-05 ENCOUNTER — Other Ambulatory Visit: Payer: Self-pay | Admitting: Physician Assistant

## 2014-06-18 ENCOUNTER — Other Ambulatory Visit: Payer: Self-pay

## 2014-06-18 MED ORDER — PITAVASTATIN CALCIUM 2 MG PO TABS: ORAL_TABLET | ORAL | Status: DC

## 2014-06-18 NOTE — Telephone Encounter (Signed)
Pt left v/m requesting refill livalo to [facility]. Pt last cholesterol lab 04/19/14. Webb Silversmith NP has not prescribed before.Please advise.

## 2014-07-13 LAB — HM MAMMOGRAPHY: HM Mammogram: NORMAL

## 2014-07-25 ENCOUNTER — Other Ambulatory Visit: Payer: Self-pay | Admitting: Internal Medicine

## 2014-09-26 ENCOUNTER — Other Ambulatory Visit: Payer: Self-pay | Admitting: Internal Medicine

## 2014-09-27 NOTE — Telephone Encounter (Signed)
Yes they are 2 different medications

## 2014-09-27 NOTE — Telephone Encounter (Signed)
Is pt supposed to take Livalo and tricor--please advise

## 2014-10-20 ENCOUNTER — Encounter: Payer: Self-pay | Admitting: Internal Medicine

## 2014-10-20 ENCOUNTER — Ambulatory Visit (INDEPENDENT_AMBULATORY_CARE_PROVIDER_SITE_OTHER): Payer: 59 | Admitting: Internal Medicine

## 2014-10-20 VITALS — BP 122/70 | HR 72 | Temp 98.3°F | Wt 179.0 lb

## 2014-10-20 DIAGNOSIS — I8393 Asymptomatic varicose veins of bilateral lower extremities: Secondary | ICD-10-CM

## 2014-10-20 NOTE — Progress Notes (Signed)
Subjective:    Patient ID: [name], female    DOB: Dec 10, 1955, 59 y.o.   MRN: [id number]  HPI  Pt presents to the clinic today with c/o a knot on the side of her left leg. She noticed this 2-3 weeks ago. She describes the pain as sharp and shooting. She has noticed some swelling and pain as well. She denies redness or warmth. She has a similar knot on the back of her right calf, but that one is not tender. It seems to hurt worse when she lays down at night or when she crosses her legs. She denies numbness and tingling in her feet. She has tried putting ice on it without much relief. She has never had anything like this in the past and is concerned that she may have a blood clot.  Review of Systems      Past Medical History  Diagnosis Date  . Complication of anesthesia     woke during surgery (ablation) 2006?  Marland Kitchen Blood transfusion     1984   [place]  . Chronic kidney disease     occasional renal stone  . Hypercholesteremia     on meds  . Diabetes mellitus     type 2 Diab- A1C-7  . GERD (gastroesophageal reflux disease)     occasional Protonix-will take protonix night before surgery    Current Outpatient Prescriptions  Medication Sig Dispense Refill  . esomeprazole (NEXIUM) 40 MG capsule TAKE 1 CAPSULE (40 MG TOTAL) BY MOUTH DAILY. 30 capsule 3  . fenofibrate (TRICOR) 145 MG tablet Take 145 mg by mouth daily.  6  . JANUMET XR 50-1000 MG TB24 Take 1 tablet by mouth 2 (two) times daily.  12  . LIVALO 2 MG TABS TAKE 1 TABLET DAILY FOR CHOLESTEROL 30 tablet 5  . Multiple Vitamins-Minerals (MULTIVITAMINS THER. W/MINERALS) TABS Take 1 tablet by mouth daily.      . Omega-3 Fatty Acids (FISH OIL) 500 MG CAPS Take 500 mg by mouth daily.    Marland Kitchen OVER THE COUNTER MEDICATION Take 1 tablet by mouth 2 (two) times daily. PATIENT TAKES CINNAMON TABLETS 1000mg     . NOVOLOG MIX 70/30 FLEXPEN (70-30) 100 UNIT/ML FlexPen INJECT 10 UNITS IN THE MORNING/ INJECT 15 UNITS IN THE EVENING TWICE A DAY BEFORE  MEALS  6   No current facility-administered medications for this visit.    Allergies  Allergen Reactions  . Invokana [Canagliflozin]     Skin peelin  . Jardiance [Empagliflozin]     Myalgias    Family History  Problem Relation Age of Onset  . Hypertension Mother   . Hyperlipidemia Mother   . Heart disease Mother   . Heart disease Father   . Hyperlipidemia Father   . Diabetes Father   . Cancer Paternal Aunt     lung/ brain    History   Social History  . Marital Status: Married    Spouse Name: N/A  . Number of Children: N/A  . Years of Education: N/A   Occupational History  . Not on file.   Social History Main Topics  . Smoking status: Current Every Day Smoker -- 1.00 packs/day for 30 years  . Smokeless tobacco: Never Used  . Alcohol Use: No  . Drug Use: No  . Sexual Activity: Yes   Other Topics Concern  . Not on file   Social History Narrative     Constitutional: Denies fever, malaise, fatigue, headache or abrupt weight changes.  Respiratory: Denies difficulty breathing, shortness of breath, cough or sputum production.   Cardiovascular: Denies chest pain, chest tightness, palpitations or swelling in the hands or feet.  Musculoskeletal: Denies decrease in range of motion, difficulty with gait, muscle pain or joint pain and swelling.  Skin: Pt reports knot of both lower legs. Denies redness, rashes, lesions or ulcercations.  Neurological: Denies numbness or tingling in feet, or problems with balance and coordination.   No other specific complaints in a complete review of systems (except as listed in HPI above).  Objective:   Physical Exam  BP 122/70 mmHg  Pulse 72  Temp(Src) 98.3 F (36.8 C) (Oral)  Wt 179 lb (81.194 kg)  SpO2 98% Wt Readings from Last 3 Encounters:  10/20/14 179 lb (81.194 kg)  05/17/14 177 lb (80.287 kg)  04/19/14 175 lb (79.379 kg)    General: Appears her stated age, well developed, well nourished in NAD. Skin: Warm, dry and  intact. No rashes, lesions or ulcerations noted. Cardiovascular: Normal rate and rhythm. S1,S2 noted.  No murmur, rubs or gallops noted. Varicose veins noted of left lateral calf and right posterior calf. No redness, warmth or ulceration noted. Pedal pulse 2+ bilaterally. Pulmonary/Chest: Normal effort and positive vesicular breath sounds. No respiratory distress. No wheezes, rales or ronchi noted.  Musculoskeletal: Normal flexion and extension of the knees. No signs of joint swelling. No difficulty with gait.  Neurological: Alert and oriented. Sensation intact to BLE.   BMET    Component Value Date/Time   NA 136 04/19/2014 1540   K 3.9 04/19/2014 1540   CL 104 04/19/2014 1540   CO2 27 04/19/2014 1540   GLUCOSE 191* 04/19/2014 1540   BUN 17 04/19/2014 1540   CREATININE 1.1 04/19/2014 1540   CREATININE 0.65 01/07/2014 1402   CALCIUM 9.3 04/19/2014 1540   GFRNONAA >89 01/07/2014 1402   GFRNONAA >60 01/30/2011 0924   GFRAA >89 01/07/2014 1402   GFRAA >60 01/30/2011 0924    Lipid Panel     Component Value Date/Time   CHOL 213* 04/19/2014 1540   TRIG 311.0* 04/19/2014 1540   HDL 51.00 04/19/2014 1540   CHOLHDL 4 04/19/2014 1540   VLDL 62.2* 04/19/2014 1540   LDLCALC NOT CALC 01/07/2014 1402    CBC    Component Value Date/Time   WBC 10.1 04/19/2014 1540   RBC 4.11 04/19/2014 1540   HGB 12.3 04/19/2014 1540   HCT 37.3 04/19/2014 1540   PLT 288.0 04/19/2014 1540   MCV 90.7 04/19/2014 1540   MCH 29.2 01/07/2014 1402   MCHC 33.0 04/19/2014 1540   RDW 13.6 04/19/2014 1540   LYMPHSABS 3.1 01/07/2014 1402   MONOABS 0.6 01/07/2014 1402   EOSABS 0.1 01/07/2014 1402   BASOSABS 0.0 01/07/2014 1402    Hgb A1C Lab Results  Component Value Date   HGBA1C 8.8* 04/19/2014         Assessment & Plan:   Varicose veins of BLE:  Self limiting Discussed wearing TED hose during the day, take off at night She is not interested in this because [place] Ibuprofen as needed for pain and inflammation She would like a referral to a vascular clinic to discuss her options Referral placed today  RTC as needed or if symptoms persist or worsen

## 2014-10-20 NOTE — Patient Instructions (Signed)
Varicose Veins Varicose veins are veins that have become enlarged and twisted. CAUSES This condition is the result of valves in the veins not working properly. Valves in the veins help return blood from the leg to the heart. If these valves are damaged, blood flows backwards and backs up into the veins in the leg near the skin. This causes the veins to become larger. People who are on their feet a lot, who are pregnant, or who are overweight are more likely to develop varicose veins. SYMPTOMS   Bulging, twisted-appearing, bluish veins, most commonly found on the legs.  Leg pain or a feeling of heaviness. These symptoms may be worse at the end of the day.  Leg swelling.  Skin color changes. DIAGNOSIS  Varicose veins can usually be diagnosed with an exam of your legs by your caregiver. He or she may recommend an ultrasound of your leg veins. TREATMENT  Most varicose veins can be treated at home.However, other treatments are available for people who have persistent symptoms or who want to treat the cosmetic appearance of the varicose veins. These include:  Laser treatment of very small varicose veins.  Medicine that is shot (injected) into the vein. This medicine hardens the walls of the vein and closes off the vein. This treatment is called sclerotherapy. Afterwards, you may need to wear clothing or bandages that apply pressure.  Surgery. HOME CARE INSTRUCTIONS   Do not stand or sit in one position for long periods of time. Do not sit with your legs crossed. Rest with your legs raised during the day.  Wear elastic stockings or support hose. Do not wear other tight, encircling garments around the legs, pelvis, or waist.  Walk as much as possible to increase blood flow.  Raise the foot of your bed at night with 2-inch blocks.  If you get a cut in the skin over the vein and the vein bleeds, lie down with your leg raised and press on it with a clean cloth until the bleeding stops. Then  place a bandage (dressing) on the cut. See your caregiver if it continues to bleed or needs stitches. SEEK MEDICAL CARE IF:   The skin around your ankle starts to break down.  You have pain, redness, tenderness, or hard swelling developing in your leg over a vein.  You are uncomfortable due to leg pain. Document Released: 01/31/2005 Document Revised: 07/16/2011 Document Reviewed: 06/19/2010 [facility] Patient Information 2015 [place], [place]. This information is not intended to replace advice given to you by your health care provider. Make sure you discuss any questions you have with your health care provider.

## 2014-10-20 NOTE — Progress Notes (Signed)
Pre visit review using our clinic review tool, if applicable. No additional management support is needed unless otherwise documented below in the visit note. 

## 2014-10-28 ENCOUNTER — Other Ambulatory Visit: Payer: Self-pay

## 2014-10-28 DIAGNOSIS — I83813 Varicose veins of bilateral lower extremities with pain: Secondary | ICD-10-CM

## 2014-11-22 ENCOUNTER — Encounter: Payer: Self-pay | Admitting: Vascular Surgery

## 2014-11-23 ENCOUNTER — Other Ambulatory Visit: Payer: Self-pay | Admitting: Vascular Surgery

## 2014-11-23 DIAGNOSIS — I83813 Varicose veins of bilateral lower extremities with pain: Secondary | ICD-10-CM

## 2014-11-24 ENCOUNTER — Encounter: Payer: Self-pay | Admitting: Vascular Surgery

## 2014-11-24 ENCOUNTER — Ambulatory Visit (HOSPITAL_COMMUNITY)
Admission: RE | Admit: 2014-11-24 | Discharge: 2014-11-24 | Disposition: A | Discharge status: Home / Self Care | Payer: Commercial Managed Care - HMO | Source: Ambulatory Visit | Attending: Vascular Surgery | Admitting: Vascular Surgery

## 2014-11-24 ENCOUNTER — Ambulatory Visit (INDEPENDENT_AMBULATORY_CARE_PROVIDER_SITE_OTHER): Payer: Commercial Managed Care - HMO | Admitting: Vascular Surgery

## 2014-11-24 VITALS — BP 105/70 | HR 73 | Ht 63.0 in | Wt 184.4 lb

## 2014-11-24 DIAGNOSIS — I872 Venous insufficiency (chronic) (peripheral): Secondary | ICD-10-CM | POA: Diagnosis not present

## 2014-11-24 DIAGNOSIS — I83813 Varicose veins of bilateral lower extremities with pain: Secondary | ICD-10-CM | POA: Insufficient documentation

## 2014-11-24 NOTE — Progress Notes (Signed)
[facility] Specialist [facility]  Patient name: [name] MRN: [id number] DOB: 1956/03/11 Sex: female  REASON FOR CONSULT: leg pain and swelling  HPI: [name] is a 59 y.o. female who presents for evaluation of venous disease. She has had significant leg pain and swelling in both lower extremities. I reviewed her records from [place] primary care. She had noted an area of swelling on the anterior lateral aspect of her left leg. She was found to have a varicose vein there. For this reason she was sent for venous evaluation.  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 She experiences significant aching and throbbing in her legs which is aggravated by standing and sitting and relieved somewhat with elevation. She denies any previous history of DVT or phlebitis. She is unaware of any family history of venous disease.   Past Medical History  Diagnosis Date  . Complication of anesthesia     woke during surgery (ablation) 2006?  Marland Kitchen Blood transfusion     1984   [place]  . Chronic kidney disease     occasional renal stone  . Hypercholesteremia     on meds  . Diabetes mellitus     type 2 Diab- A1C-7  . GERD (gastroesophageal reflux disease)     occasional Protonix-will take protonix night before surgery   Family History  Problem Relation Age of Onset  . Hypertension Mother   . Hyperlipidemia Mother   . Heart disease Mother   . Heart disease Father   . Hyperlipidemia Father   . Diabetes Father   . Heart attack Father   . Varicose Veins Father   . Peripheral vascular disease Father     amputation  . Cancer Paternal Aunt     lung/ brain   SOCIAL HISTORY: History  Substance Use Topics  . Smoking status: Current Every Day Smoker -- 1.00 packs/day for 30 years    Types: Cigarettes  . Smokeless tobacco: Never Used  . Alcohol Use: No   Allergies  Allergen Reactions  . Invokana [Canagliflozin]     Skin peelin  . Jardiance [Empagliflozin]     Myalgias   Current  Outpatient Prescriptions  Medication Sig Dispense Refill  . esomeprazole (NEXIUM) 40 MG capsule TAKE 1 CAPSULE (40 MG TOTAL) BY MOUTH DAILY. 30 capsule 3  . fenofibrate (TRICOR) 145 MG tablet Take 145 mg by mouth daily.  6  . JANUMET XR 50-1000 MG TB24 Take 1 tablet by mouth 2 (two) times daily.  12  . LIVALO 2 MG TABS TAKE 1 TABLET DAILY FOR CHOLESTEROL 30 tablet 5  . Multiple Vitamins-Minerals (MULTIVITAMINS THER. W/MINERALS) TABS Take 1 tablet by mouth daily.      Marland Kitchen NOVOLOG MIX 70/30 FLEXPEN (70-30) 100 UNIT/ML FlexPen INJECT 10 UNITS IN THE MORNING/ INJECT 15 UNITS IN THE EVENING TWICE A DAY BEFORE MEALS  6  . Omega-3 Fatty Acids (FISH OIL) 500 MG CAPS Take 500 mg by mouth daily.    Marland Kitchen OVER THE COUNTER MEDICATION Take 1 tablet by mouth 2 (two) times daily. PATIENT TAKES CINNAMON TABLETS 1000mg      No current facility-administered medications for this visit.   REVIEW OF SYSTEMS: Valu.Nieves ] denotes positive finding; [  ] denotes negative finding  CARDIOVASCULAR:  [ ]  chest pain   [ ]  chest pressure   [ ]  palpitations   [ ]  orthopnea   [ ]  dyspnea on exertion   [ ]  claudication   [ ]   rest pain   [ ]  DVT   [ ]  phlebitis PULMONARY:   [ ]  productive cough   [ ]  asthma   [ ]  wheezing NEUROLOGIC:   [ ]  weakness  [ ]  paresthesias  [ ]  aphasia  [ ]  amaurosis  [ ]  dizziness HEMATOLOGIC:   [ ]  bleeding problems   [ ]  clotting disorders MUSCULOSKELETAL:  [ ]  joint pain   [ ]  joint swelling Valu.Nieves ] leg swelling GASTROINTESTINAL: [ ]   blood in stool  [ ]   hematemesis GENITOURINARY:  [ ]   dysuria  [ ]   hematuria PSYCHIATRIC:  [ ]  history of major depression INTEGUMENTARY:  [ ]  rashes  [ ]  ulcers CONSTITUTIONAL:  [ ]  fever   [ ]  chills  PHYSICAL EXAM: Filed Vitals:   11/24/14 1041  BP: 105/70  Pulse: 73  Height: 5\' 3"  (1.6 m)  Weight: 184 lb 6.4 oz (83.643 kg)  SpO2: 93%   GENERAL: The patient is a well-nourished female, in no acute distress. The vital signs are documented above. CARDIAC: There is a  regular rate and rhythm.  VASCULAR: I do not detect carotid bruits. She has palpable popliteal and pedal pulses bilaterally. She currently has no significant lower extremity swelling. PULMONARY: There is good air exchange bilaterally without wheezing or rales. ABDOMEN: Soft and non-tender with normal pitched bowel sounds.  MUSCULOSKELETAL: There are no major deformities or cyanosis. NEUROLOGIC: No focal weakness or paresthesias are detected. SKIN: he has some slight hyperpigmentation bilaterally consistent with chronic venous insufficiency.  She has a slight bulging area on the anterior lateral aspect of her left leg which is likely an underlying incompetent perforator. She has a couple small varicose veins in her posterior right calf.PSYCHIATRIC: The patient has a normal affect.  DATA:  I have independently interpreted her venous duplex can today. On the right side she has no evidence of DVT. She does have some reflux in the common femoral vein on the right. There is no reflux in her great saphenous vein or small saphenous vein.  On the left side, there is no evidence of DVT. She does have some reflux in the common femoral vein and femoral vein. There is no reflux in the great saphenous vein or small saphenous vein.  MEDICAL ISSUES: CHRONIC VENOUS INSUFFICIENCY: The patient does have deep vein reflux bilaterally. There is no reflux in the great saphenous vein bilaterally. We have discussed the importance of intermittent leg elevation the proper positioning for this. In addition I have written her a prescription for knee-high compression stockings with a gradient of 15-20 mmHg. We have discussed the importance of exercise and walking is much as possible. I have encouraged her to avoid prolonged sitting and standing. First her to consider water aerobics. I reassured her that her venous disease was not anything serious but that this was a chronic problem. He happy to see her back at anytime if her  symptoms progress.  Deitra Mayo [facility] [place]: [phone]

## 2014-12-28 ENCOUNTER — Ambulatory Visit: Payer: Commercial Managed Care - HMO | Admitting: Internal Medicine

## 2015-02-01 ENCOUNTER — Ambulatory Visit (INDEPENDENT_AMBULATORY_CARE_PROVIDER_SITE_OTHER): Payer: Commercial Managed Care - HMO | Admitting: Internal Medicine

## 2015-02-01 ENCOUNTER — Encounter: Payer: Self-pay | Admitting: Internal Medicine

## 2015-02-01 VITALS — BP 130/70 | HR 78 | Temp 97.9°F | Wt 182.5 lb

## 2015-02-01 DIAGNOSIS — R091 Pleurisy: Secondary | ICD-10-CM | POA: Diagnosis not present

## 2015-02-01 MED ORDER — PREDNISONE 10 MG PO TABS: ORAL_TABLET | ORAL | Status: DC

## 2015-02-01 NOTE — Progress Notes (Signed)
Subjective:    Patient ID: [name], female    DOB: [birth year], 57 y.o.   MRN: [id number]  HPI  Pt presents to the clinic today with c/o pleuritic chest pain. This is a chronic issue for her that flares up intermittently. Her pain started this morning. The pain is worse with taking a deep breath. She denies shortness of breath. She denies runny nose, sore throat or cough. She has taken Ibuprofen every 6-8 hours with minimal relief. She has had sick contacts.  Review of Systems  Past Medical History  Diagnosis Date  . Complication of anesthesia     woke during surgery (ablation) 2006?  Marland Kitchen Blood transfusion     1984   [place]  . Chronic kidney disease     occasional renal stone  . Hypercholesteremia     on meds  . Diabetes mellitus     type 2 Diab- A1C-7  . GERD (gastroesophageal reflux disease)     occasional Protonix-will take protonix night before surgery    Current Outpatient Prescriptions  Medication Sig Dispense Refill  . esomeprazole (NEXIUM) 40 MG capsule TAKE 1 CAPSULE (40 MG TOTAL) BY MOUTH DAILY. 30 capsule 3  . fenofibrate (TRICOR) 145 MG tablet Take 145 mg by mouth daily.  6  . JANUMET XR 50-1000 MG TB24 Take 1 tablet by mouth 2 (two) times daily.  12  . LIVALO 2 MG TABS TAKE 1 TABLET DAILY FOR CHOLESTEROL 30 tablet 5  . Multiple Vitamins-Minerals (MULTIVITAMINS THER. W/MINERALS) TABS Take 1 tablet by mouth daily.      Marland Kitchen NOVOLOG MIX 70/30 FLEXPEN (70-30) 100 UNIT/ML FlexPen INJECT 10 UNITS IN THE MORNING/ INJECT 15 UNITS IN THE EVENING TWICE A DAY BEFORE MEALS  6  . Omega-3 Fatty Acids (FISH OIL) 500 MG CAPS Take 500 mg by mouth daily.    Marland Kitchen OVER THE COUNTER MEDICATION Take 1 tablet by mouth 2 (two) times daily. PATIENT TAKES CINNAMON TABLETS 1000mg      No current facility-administered medications for this visit.    Allergies  Allergen Reactions  . Invokana [Canagliflozin]     Skin peelin  . Jardiance [Empagliflozin]     Myalgias    Family History  Problem  Relation Age of Onset  . Hypertension Mother   . Hyperlipidemia Mother   . Heart disease Mother   . Heart disease Father   . Hyperlipidemia Father   . Diabetes Father   . Heart attack Father   . Varicose Veins Father   . Peripheral vascular disease Father     amputation  . Cancer Paternal Aunt     lung/ brain    Social History   Social History  . Marital Status: Married    Spouse Name: N/A  . Number of Children: N/A  . Years of Education: N/A   Occupational History  . Not on file.   Social History Main Topics  . Smoking status: Current Every Day Smoker -- 1.00 packs/day for 30 years    Types: Cigarettes  . Smokeless tobacco: Never Used  . Alcohol Use: No  . Drug Use: No  . Sexual Activity: Yes   Other Topics Concern  . Not on file   Social History Narrative     Constitutional: Denies fever, malaise, fatigue, headache or abrupt weight changes.  HEENT: Denies eye pain, eye redness, ear pain, ringing in the ears, wax buildup, runny nose, nasal congestion, bloody nose, or sore throat. Respiratory: Denies difficulty breathing, shortness of  breath, cough or sputum production.   Cardiovascular: Pt reports pleuritic chest pain. Denies chest tightness, palpitations or swelling in the hands or feet.  Gastrointestinal: Denies abdominal pain, bloating, constipation, diarrhea or blood in the stool.  Musculoskeletal: Denies decrease in range of motion, difficulty with gait, muscle pain or joint pain and swelling.  Psych: Denies anxiety, depression, SI/HI.  No other specific complaints in a complete review of systems (except as listed in HPI above).     Objective:   Physical Exam  BP 130/70 mmHg  Pulse 78  Temp(Src) 97.9 F (36.6 C) (Oral)  Wt 182 lb 8 oz (82.781 kg)  SpO2 97% Wt Readings from Last 3 Encounters:  02/01/15 182 lb 8 oz (82.781 kg)  11/24/14 184 lb 6.4 oz (83.643 kg)  10/20/14 179 lb (81.194 kg)    General: Appears hernstated age, well developed, well  nourished in NAD. HEENT: Head: normal shape and size; Eyes: sclera white, no icterus, conjunctiva pink, PERRLA and EOMs intact; Ears: Tm's gray and intact, normal light reflex; Nose: mucosa pink and moist, septum midline; Throat/Mouth: Teeth present, mucosa pink and moist, no exudate, lesions or ulcerations noted.  Neck:  No adenopathy noted. Cardiovascular: Normal rate and rhythm. S1,S2 noted.  No murmur, rubs or gallops noted.  Pulmonary/Chest: Normal effort and positive vesicular breath sounds. No respiratory distress. No wheezes, rales or ronchi noted.   BMET    Component Value Date/Time   NA 136 04/19/2014 1540   K 3.9 04/19/2014 1540   CL 104 04/19/2014 1540   CO2 27 04/19/2014 1540   GLUCOSE 191* 04/19/2014 1540   BUN 17 04/19/2014 1540   CREATININE 1.1 04/19/2014 1540   CREATININE 0.65 01/07/2014 1402   CALCIUM 9.3 04/19/2014 1540   GFRNONAA >89 01/07/2014 1402   GFRNONAA >60 01/30/2011 0924   GFRAA >89 01/07/2014 1402   GFRAA >60 01/30/2011 0924    Lipid Panel     Component Value Date/Time   CHOL 213* 04/19/2014 1540   TRIG 311.0* 04/19/2014 1540   HDL 51.00 04/19/2014 1540   CHOLHDL 4 04/19/2014 1540   VLDL 62.2* 04/19/2014 1540   LDLCALC NOT CALC 01/07/2014 1402    CBC    Component Value Date/Time   WBC 10.1 04/19/2014 1540   RBC 4.11 04/19/2014 1540   HGB 12.3 04/19/2014 1540   HCT 37.3 04/19/2014 1540   PLT 288.0 04/19/2014 1540   MCV 90.7 04/19/2014 1540   MCH 29.2 01/07/2014 1402   MCHC 33.0 04/19/2014 1540   RDW 13.6 04/19/2014 1540   LYMPHSABS 3.1 01/07/2014 1402   MONOABS 0.6 01/07/2014 1402   EOSABS 0.1 01/07/2014 1402   BASOSABS 0.0 01/07/2014 1402    Hgb A1C Lab Results  Component Value Date   HGBA1C 8.8* 04/19/2014         Assessment & Plan:   Pleuritic chest pain:  Exam benign eRx for Pred Taper- monitor sugars, let me know if persist > 250 Hold Ibuprofen while on Prednisone  RTC as needed or if symptoms persist or  worsen

## 2015-02-01 NOTE — Progress Notes (Signed)
Pre visit review using our clinic review tool, if applicable. No additional management support is needed unless otherwise documented below in the visit note. 

## 2015-02-01 NOTE — Patient Instructions (Signed)

## 2015-02-04 ENCOUNTER — Telehealth: Payer: Self-pay | Admitting: Internal Medicine

## 2015-02-04 ENCOUNTER — Encounter: Payer: Self-pay | Admitting: Family Medicine

## 2015-02-04 ENCOUNTER — Ambulatory Visit (INDEPENDENT_AMBULATORY_CARE_PROVIDER_SITE_OTHER): Payer: Commercial Managed Care - HMO | Admitting: Family Medicine

## 2015-02-04 VITALS — BP 110/70 | HR 85 | Temp 97.9°F | Wt 185.0 lb

## 2015-02-04 DIAGNOSIS — R0781 Pleurodynia: Secondary | ICD-10-CM | POA: Insufficient documentation

## 2015-02-04 MED ORDER — PREDNISONE 20 MG PO TABS: ORAL_TABLET | ORAL | Status: DC

## 2015-02-04 NOTE — Assessment & Plan Note (Signed)
Likely due to viral infection. No sign of bacterial infection.  If not improving will eval with CXR.  Treat with longer steroid taper. Symptomatic care.

## 2015-02-04 NOTE — Progress Notes (Signed)
   Subjective:    Patient ID: [name], female    DOB: [birth year], 57 y.o.   MRN: [id number]  HPI  59 year old female patient of [facility] with DM, GERD  Presents for follow up pleurisy.  She has a history of chronic pleuritic pain. Flares up intermittently  Seen by PCP on 9/27 for pleuritic pain , acute episode.  Treated with prednisone taper.   Today she reports within 1 hour of taking prednisone she felt better.  Now on day 4 of pred taper ( 30 mg ).. Pain restarted on right side now.  She has been around a lot of people with infecitons in her office.  She has noted cough start this AM. Dry cough. No fever, no nasal congestion, no ear pain, no ST.  She is a smoker.. Has started chantix in last week.. She has stopped it for now.   FBS on prednsione 450.Marland Kitchen She was told by ENDO to increase insulin.  FBS 200 now on higher dose.  Review of Systems  Constitutional: Negative for fever and fatigue.  HENT: Negative for ear pain.   Eyes: Negative for pain.  Respiratory: Positive for cough. Negative for chest tightness and shortness of breath.   Cardiovascular: Positive for chest pain. Negative for palpitations and leg swelling.  Gastrointestinal: Negative for abdominal pain.  Genitourinary: Negative for dysuria.       Objective:   Physical Exam  Constitutional: Vital signs are normal. She appears well-developed and well-nourished. She is cooperative.  Non-toxic appearance. She does not appear ill. No distress.  HENT:  Head: Normocephalic.  Right Ear: Hearing, tympanic membrane, external ear and ear canal normal. Tympanic membrane is not erythematous, not retracted and not bulging.  Left Ear: Hearing, tympanic membrane, external ear and ear canal normal. Tympanic membrane is not erythematous, not retracted and not bulging.  Nose: Mucosal edema and rhinorrhea present. Right sinus exhibits no maxillary sinus tenderness and no frontal sinus tenderness. Left sinus exhibits no  maxillary sinus tenderness and no frontal sinus tenderness.  Mouth/Throat: Uvula is midline, oropharynx is clear and moist and mucous membranes are normal.  Eyes: Conjunctivae, EOM and lids are normal. Pupils are equal, round, and reactive to light. Lids are everted and swept, no foreign bodies found.  Neck: Trachea normal and normal range of motion. Neck supple. Carotid bruit is not present. No thyroid mass and no thyromegaly present.  Cardiovascular: Normal rate, regular rhythm, S1 normal, S2 normal, normal heart sounds, intact distal pulses and normal pulses.  Exam reveals no gallop and no friction rub.   No murmur heard. Pulmonary/Chest: Effort normal and breath sounds normal. No tachypnea. No respiratory distress. She has no decreased breath sounds. She has no wheezes. She has no rhonchi. She has no rales. She exhibits tenderness. She exhibits no bony tenderness.  Neurological: She is alert.  Skin: Skin is warm, dry and intact. No rash noted.  Psychiatric: Her speech is normal and behavior is normal. Judgment normal. Her mood appears not anxious. Cognition and memory are normal. She does not exhibit a depressed mood.          Assessment & Plan:

## 2015-02-04 NOTE — Patient Instructions (Addendum)
Increase the length of prednisone taper. Mucinex DM twice daily. Call if not improving as expected .  Quit smoking and restart chantix when feeling better.

## 2015-02-04 NOTE — Progress Notes (Signed)
Pre visit review using our clinic review tool, if applicable. No additional management support is needed unless otherwise documented below in the visit note. 

## 2015-02-04 NOTE — Telephone Encounter (Signed)
Patient Name: [name] DOB: June 27, 1955 Initial Comment Caller States pleurisy was in her left lung but has moved over to the right. she only has 2 days left of her meds to treat the left side. she has pain in the bottom part of her lung, sharp pain everything she breaths, cough. Nurse Assessment Nurse: Ronnald Ramp, RN, Miranda Date/Time (Eastern Time): 02/04/2015 1:29:57 PM Confirm and document reason for call. If symptomatic, describe symptoms. ---Caller states she was diagnosed with Pleurisy in the left side on Tuesday and started on Prednisone. She is now having pain in the right side. The doctor told her to stop taking Ibuprofen while on prednisone but wanting to know if it is ok to take Ibuprofen now. Since being seen, she has started having dry cough and runny nose. Has the patient traveled out of the country within the last 30 days? ---Not Applicable Does the patient require triage? ---Yes Related visit to physician within the last 2 weeks? ---Yes Does the PT have any chronic conditions? (i.e. diabetes, asthma, etc.) ---Yes List chronic conditions. ---Pleurisy, Diabetes Guidelines Guideline Title Affirmed Question Affirmed Notes Cough - Acute Non-Productive Cough (all triage questions negative) Final Disposition User See Physician within 4 Hours (or PCP triage) Ronnald Ramp, RN, Miranda Comments Appt scheduled for today at 3:30pm with Dr. Diona Browner. Referrals REFERRED TO PCP OFFICE Disagree/Comply:

## 2015-02-04 NOTE — Telephone Encounter (Signed)
Pt has appt with Dr Diona Browner on 02/04/15 at 3:30.

## 2015-02-23 ENCOUNTER — Encounter: Payer: Self-pay | Admitting: Emergency Medicine

## 2015-03-11 ENCOUNTER — Other Ambulatory Visit: Payer: Self-pay | Admitting: Internal Medicine

## 2015-03-24 ENCOUNTER — Encounter: Payer: Self-pay | Admitting: Internal Medicine

## 2015-03-24 ENCOUNTER — Ambulatory Visit (INDEPENDENT_AMBULATORY_CARE_PROVIDER_SITE_OTHER): Payer: Commercial Managed Care - HMO | Admitting: Internal Medicine

## 2015-03-24 VITALS — BP 110/68 | HR 85 | Temp 97.5°F | Ht 63.0 in | Wt 187.0 lb

## 2015-03-24 DIAGNOSIS — E785 Hyperlipidemia, unspecified: Secondary | ICD-10-CM | POA: Diagnosis not present

## 2015-03-24 DIAGNOSIS — Z0000 Encounter for general adult medical examination without abnormal findings: Secondary | ICD-10-CM

## 2015-03-24 DIAGNOSIS — K219 Gastro-esophageal reflux disease without esophagitis: Secondary | ICD-10-CM

## 2015-03-24 DIAGNOSIS — E119 Type 2 diabetes mellitus without complications: Secondary | ICD-10-CM | POA: Diagnosis not present

## 2015-03-24 LAB — COMPREHENSIVE METABOLIC PANEL
ALT: 27 U/L (ref 0–35)
AST: 20 U/L (ref 0–37)
Albumin: 4.4 g/dL (ref 3.5–5.2)
Alkaline Phosphatase: 66 U/L (ref 39–117)
BILIRUBIN TOTAL: 0.5 mg/dL (ref 0.2–1.2)
BUN: 21 mg/dL (ref 6–23)
CO2: 28 mEq/L (ref 19–32)
Calcium: 9.8 mg/dL (ref 8.4–10.5)
Chloride: 99 mEq/L (ref 96–112)
Creatinine, Ser: 0.88 mg/dL (ref 0.40–1.20)
GFR: 70.36 mL/min (ref >60.00)
GLUCOSE: 91 mg/dL (ref 70–99)
POTASSIUM: 3.8 meq/L (ref 3.5–5.1)
Sodium: 136 mEq/L (ref 135–145)
Total Protein: 7.4 g/dL (ref 6.0–8.3)

## 2015-03-24 LAB — MICROALBUMIN / CREATININE URINE RATIO
Creatinine,U: 198.1 mg/dL
Microalb Creat Ratio: 0.6 mg/g (ref 0.0–30.0)
Microalb, Ur: 1.2 mg/dL (ref 0.0–1.9)

## 2015-03-24 LAB — LIPID PANEL
Cholesterol: 186 mg/dL (ref 0–200)
HDL: 55.6 mg/dL (ref >39.00)
LDL CALC: 96 mg/dL (ref 0–99)
NONHDL: 130.23
Total CHOL/HDL Ratio: 3
Triglycerides: 172 mg/dL — ABNORMAL HIGH (ref 0.0–149.0)
VLDL: 34.4 mg/dL (ref 0.0–40.0)

## 2015-03-24 LAB — CBC
HCT: 40.7 % (ref 36.0–46.0)
HEMOGLOBIN: 13.6 g/dL (ref 12.0–15.0)
MCHC: 33.4 g/dL (ref 30.0–36.0)
MCV: 86.7 fl (ref 78.0–100.0)
Platelets: 278 10*3/uL (ref 150.0–400.0)
RBC: 4.7 Mil/uL (ref 3.87–5.11)
RDW: 13.1 % (ref 11.5–15.5)
WBC: 10.9 10*3/uL — ABNORMAL HIGH (ref 4.0–10.5)

## 2015-03-24 LAB — HEMOGLOBIN A1C: Hgb A1c MFr Bld: 10 % — ABNORMAL HIGH (ref 4.6–6.5)

## 2015-03-24 NOTE — Progress Notes (Signed)
Pre visit review using our clinic review tool, if applicable. No additional management support is needed unless otherwise documented below in the visit note. 

## 2015-03-24 NOTE — Patient Instructions (Signed)
Health Maintenance, Female Adopting a healthy lifestyle and getting preventive care can go a long way to promote health and wellness. Talk with your health care provider about what schedule of regular examinations is right for you. This is a good chance for you to check in with your provider about disease prevention and staying healthy. In between checkups, there are plenty of things you can do on your own. Experts have done a lot of research about which lifestyle changes and preventive measures are most likely to keep you healthy. Ask your health care provider for more information. WEIGHT AND DIET  Eat a healthy diet  Be sure to include plenty of vegetables, fruits, low-fat dairy products, and lean protein.  Do not eat a lot of foods high in solid fats, added sugars, or salt.  Get regular exercise. This is one of the most important things you can do for your health.  Most adults should exercise for at least 150 minutes each week. The exercise should increase your heart rate and make you sweat (moderate-intensity exercise).  Most adults should also do strengthening exercises at least twice a week. This is in addition to the moderate-intensity exercise.  Maintain a healthy weight  Body mass index (BMI) is a measurement that can be used to identify possible weight problems. It estimates body fat based on height and weight. Your health care provider can help determine your BMI and help you achieve or maintain a healthy weight.  For females 20 years of age and older:   A BMI below 18.5 is considered underweight.  A BMI of 18.5 to 24.9 is normal.  A BMI of 25 to 29.9 is considered overweight.  A BMI of 30 and above is considered obese.  Watch levels of cholesterol and blood lipids  You should start having your blood tested for lipids and cholesterol at 57 years of age, then have this test every 5 years.  You may need to have your cholesterol levels checked more often if:  Your lipid  or cholesterol levels are high.  You are older than 57 years of age.  You are at high risk for heart disease.  CANCER SCREENING   Lung Cancer  Lung cancer screening is recommended for adults 55-80 years old who are at high risk for lung cancer because of a history of smoking.  A yearly low-dose CT scan of the lungs is recommended for people who:  Currently smoke.  Have quit within the past 15 years.  Have at least a 30-pack-year history of smoking. A pack year is smoking an average of one pack of cigarettes a day for 1 year.  Yearly screening should continue until it has been 15 years since you quit.  Yearly screening should stop if you develop a health problem that would prevent you from having lung cancer treatment.  Breast Cancer  Practice breast self-awareness. This means understanding how your breasts normally appear and feel.  It also means doing regular breast self-exams. Let your health care provider know about any changes, no matter how small.  If you are in your 20s or 30s, you should have a clinical breast exam (CBE) by a health care provider every 1-3 years as part of a regular health exam.  If you are 40 or older, have a CBE every year. Also consider having a breast X-ray (mammogram) every year.  If you have a family history of breast cancer, talk to your health care provider about genetic screening.  If you   are at high risk for breast cancer, talk to your health care provider about having an MRI and a mammogram every year.  Breast cancer gene (BRCA) assessment is recommended for women who have family members with BRCA-related cancers. BRCA-related cancers include:  Breast.  Ovarian.  Tubal.  Peritoneal cancers.  Results of the assessment will determine the need for genetic counseling and BRCA1 and BRCA2 testing. Cervical Cancer Your health care provider may recommend that you be screened regularly for cancer of the pelvic organs (ovaries, uterus, and  vagina). This screening involves a pelvic examination, including checking for microscopic changes to the surface of your cervix (Pap test). You may be encouraged to have this screening done every 3 years, beginning at age 21.  For women ages 30-65, health care providers may recommend pelvic exams and Pap testing every 3 years, or they may recommend the Pap and pelvic exam, combined with testing for human papilloma virus (HPV), every 5 years. Some types of HPV increase your risk of cervical cancer. Testing for HPV may also be done on women of any age with unclear Pap test results.  Other health care providers may not recommend any screening for nonpregnant women who are considered low risk for pelvic cancer and who do not have symptoms. Ask your health care provider if a screening pelvic exam is right for you.  If you have had past treatment for cervical cancer or a condition that could lead to cancer, you need Pap tests and screening for cancer for at least 20 years after your treatment. If Pap tests have been discontinued, your risk factors (such as having a new sexual partner) need to be reassessed to determine if screening should resume. Some women have medical problems that increase the chance of getting cervical cancer. In these cases, your health care provider may recommend more frequent screening and Pap tests. Colorectal Cancer  This type of cancer can be detected and often prevented.  Routine colorectal cancer screening usually begins at 57 years of age and continues through 57 years of age.  Your health care provider may recommend screening at an earlier age if you have risk factors for colon cancer.  Your health care provider may also recommend using home test kits to check for hidden blood in the stool.  A small camera at the end of a tube can be used to examine your colon directly (sigmoidoscopy or colonoscopy). This is done to check for the earliest forms of colorectal  cancer.  Routine screening usually begins at age 50.  Direct examination of the colon should be repeated every 5-10 years through 57 years of age. However, you may need to be screened more often if early forms of precancerous polyps or small growths are found. Skin Cancer  Check your skin from head to toe regularly.  Tell your health care provider about any new moles or changes in moles, especially if there is a change in a mole's shape or color.  Also tell your health care provider if you have a mole that is larger than the size of a pencil eraser.  Always use sunscreen. Apply sunscreen liberally and repeatedly throughout the day.  Protect yourself by wearing long sleeves, pants, a wide-brimmed hat, and sunglasses whenever you are outside. HEART DISEASE, DIABETES, AND HIGH BLOOD PRESSURE   High blood pressure causes heart disease and increases the risk of stroke. High blood pressure is more likely to develop in:  People who have blood pressure in the high end   of the normal range (130-139/85-89 mm Hg).  People who are overweight or obese.  People who are African American.  If you are 38-23 years of age, have your blood pressure checked every 3-5 years. If you are 61 years of age or older, have your blood pressure checked every year. You should have your blood pressure measured twice--once when you are at a hospital or clinic, and once when you are not at a hospital or clinic. Record the average of the two measurements. To check your blood pressure when you are not at a hospital or clinic, you can use:  An automated blood pressure machine at a pharmacy.  A home blood pressure monitor.  If you are between 45 years and 39 years old, ask your health care provider if you should take aspirin to prevent strokes.  Have regular diabetes screenings. This involves taking a blood sample to check your fasting blood sugar level.  If you are at a normal weight and have a low risk for diabetes,  have this test once every three years after 57 years of age.  If you are overweight and have a high risk for diabetes, consider being tested at a younger age or more often. PREVENTING INFECTION  Hepatitis B  If you have a higher risk for hepatitis B, you should be screened for this virus. You are considered at high risk for hepatitis B if:  You were born in a country where hepatitis B is common. Ask your health care provider which countries are considered high risk.  Your parents were born in a high-risk country, and you have not been immunized against hepatitis B (hepatitis B vaccine).  You have HIV or AIDS.  You use needles to inject street drugs.  You live with someone who has hepatitis B.  You have had sex with someone who has hepatitis B.  You get hemodialysis treatment.  You take certain medicines for conditions, including cancer, organ transplantation, and autoimmune conditions. Hepatitis C  Blood testing is recommended for:  Everyone born from [birth year] through 1963.  Anyone with known risk factors for hepatitis C. Sexually transmitted infections (STIs)  You should be screened for sexually transmitted infections (STIs) including gonorrhea and chlamydia if:  You are sexually active and are younger than 57 years of age.  You are older than 57 years of age and your health care provider tells you that you are at risk for this type of infection.  Your sexual activity has changed since you were last screened and you are at an increased risk for chlamydia or gonorrhea. Ask your health care provider if you are at risk.  If you do not have HIV, but are at risk, it may be recommended that you take a prescription medicine daily to prevent HIV infection. This is called pre-exposure prophylaxis (PrEP). You are considered at risk if:  You are sexually active and do not regularly use condoms or know the HIV status of your partner(s).  You take drugs by injection.  You are sexually  active with a partner who has HIV. Talk with your health care provider about whether you are at high risk of being infected with HIV. If you choose to begin PrEP, you should first be tested for HIV. You should then be tested every 3 months for as long as you are taking PrEP.  PREGNANCY   If you are premenopausal and you may become pregnant, ask your health care provider about preconception counseling.  If you may  become pregnant, take 400 to 800 micrograms (mcg) of folic acid every day.  If you want to prevent pregnancy, talk to your health care provider about birth control (contraception). OSTEOPOROSIS AND MENOPAUSE   Osteoporosis is a disease in which the bones lose minerals and strength with aging. This can result in serious bone fractures. Your risk for osteoporosis can be identified using a bone density scan.  If you are 61 years of age or older, or if you are at risk for osteoporosis and fractures, ask your health care provider if you should be screened.  Ask your health care provider whether you should take a calcium or vitamin D supplement to lower your risk for osteoporosis.  Menopause may have certain physical symptoms and risks.  Hormone replacement therapy may reduce some of these symptoms and risks. Talk to your health care provider about whether hormone replacement therapy is right for you.  HOME CARE INSTRUCTIONS   Schedule regular health, dental, and eye exams.  Stay current with your immunizations.   Do not use any tobacco products including cigarettes, chewing tobacco, or electronic cigarettes.  If you are pregnant, do not drink alcohol.  If you are breastfeeding, limit how much and how often you drink alcohol.  Limit alcohol intake to no more than 1 drink per day for nonpregnant women. One drink equals 12 ounces of beer, 5 ounces of wine, or 1 ounces of hard liquor.  Do not use street drugs.  Do not share needles.  Ask your health care provider for help if  you need support or information about quitting drugs.  Tell your health care provider if you often feel depressed.  Tell your health care provider if you have ever been abused or do not feel safe at home.   This information is not intended to replace advice given to you by your health care provider. Make sure you discuss any questions you have with your health care provider.   Document Released: 11/06/2010 Document Revised: 05/14/2014 Document Reviewed: 03/25/2013 Elsevier Interactive Patient Education [employer].

## 2015-03-24 NOTE — Assessment & Plan Note (Signed)
Controlled on Nexium CMET today 

## 2015-03-24 NOTE — Progress Notes (Signed)
Subjective:    Patient ID: [name], female    DOB: 10/28/1955, 59 y.o.   MRN: [id number]  HPI  Pt presents to the clinic today for her annual exam. She is also due to follow up chronic conditions. See separate note.  Flu: 01/2015 Tetanus: 2013 Pneumovax: 2011 Prevnar:  never Pap Smear: 10/2013 Mammogram: 07/2014- [facility] Colon Screening: 01/2014 Vision Screening: yearly Dentist: every 3 months  Diet: She does consume meat. She does eat fruits and veggies a few days per week. She eats fried foods. She drinks mostly water. Exercise: She is walking on a treadmill for 30 minutes 3 days per week  Review of Systems      Past Medical History  Diagnosis Date  . Complication of anesthesia     woke during surgery (ablation) 2006?  Marland Kitchen Blood transfusion     1984   [place]  . Chronic kidney disease     occasional renal stone  . Hypercholesteremia     on meds  . Diabetes mellitus     type 2 Diab- A1C-7  . GERD (gastroesophageal reflux disease)     occasional Protonix-will take protonix night before surgery    Current Outpatient Prescriptions  Medication Sig Dispense Refill  . B-D UF III MINI PEN NEEDLES 31G X 5 MM MISC 2 (two) times daily. as directed  6  . fenofibrate (TRICOR) 145 MG tablet Take 145 mg by mouth daily.  6  . HUMULIN 70/30 KWIKPEN (70-30) 100 UNIT/ML PEN Inject 30 Units into the skin 2 (two) times daily.  3  . JANUVIA 100 MG tablet Take 100 mg by mouth daily.  3  . LIVALO 2 MG TABS TAKE 1 TABLET DAILY FOR CHOLESTEROL 30 tablet 0  . Multiple Vitamins-Minerals (MULTIVITAMINS THER. W/MINERALS) TABS Take 1 tablet by mouth daily.      . Omega-3 Fatty Acids (FISH OIL) 500 MG CAPS Take 500 mg by mouth daily.    Marland Kitchen OVER THE COUNTER MEDICATION Take 1 tablet by mouth 2 (two) times daily. PATIENT TAKES CINNAMON TABLETS 1000mg     . pantoprazole (PROTONIX) 40 MG tablet Take 1 tablet by mouth daily.  3  . predniSONE (DELTASONE) 20 MG tablet 3 tabs by mouth daily x 3 days, then 2  tabs by mouth daily x 2 days then 1 tab by mouth daily x 2 days 15 tablet 0   No current facility-administered medications for this visit.    Allergies  Allergen Reactions  . Invokana [Canagliflozin]     Skin peelin  . Jardiance [Empagliflozin]     Myalgias    Family History  Problem Relation Age of Onset  . Hypertension Mother   . Hyperlipidemia Mother   . Heart disease Mother   . Heart disease Father   . Hyperlipidemia Father   . Diabetes Father   . Heart attack Father   . Varicose Veins Father   . Peripheral vascular disease Father     amputation  . Cancer Paternal Aunt     lung/ brain    Social History   Social History  . Marital Status: Married    Spouse Name: N/A  . Number of Children: N/A  . Years of Education: N/A   Occupational History  . Not on file.   Social History Main Topics  . Smoking status: Current Every Day Smoker -- 0.75 packs/day for 30 years    Types: Cigarettes  . Smokeless tobacco: Never Used  . Alcohol Use: No  .  Drug Use: No  . Sexual Activity: Yes   Other Topics Concern  . Not on file   Social History Narrative     Constitutional: Denies fever, malaise, fatigue, headache or abrupt weight changes.  HEENT: Denies eye pain, eye redness, ear pain, ringing in the ears, wax buildup, runny nose, nasal congestion, bloody nose, or sore throat. Respiratory: Denies difficulty breathing, shortness of breath, cough or sputum production.   Cardiovascular: Denies chest pain, chest tightness, palpitations or swelling in the hands or feet.  Gastrointestinal: Denies abdominal pain, bloating, constipation, diarrhea or blood in the stool.  GU: Denies urgency, frequency, pain with urination, burning sensation, blood in urine, odor or discharge. Musculoskeletal: Denies decrease in range of motion, difficulty with gait, muscle pain or joint pain and swelling.  Skin: Denies redness, rashes, lesions or ulcercations.  Neurological: Denies dizziness,  difficulty with memory, difficulty with speech or problems with balance and coordination.  Psych: Denies anxiety, depression, SI/HI.  No other specific complaints in a complete review of systems (except as listed in HPI above).  Objective:   Physical Exam   BP 110/68 mmHg  Pulse 85  Temp(Src) 97.5 F (36.4 C) (Oral)  Ht 5\' 3"  (1.6 m)  Wt 187 lb (84.823 kg)  BMI 33.13 kg/m2  SpO2 97% Wt Readings from Last 3 Encounters:  03/24/15 187 lb (84.823 kg)  02/04/15 185 lb (83.915 kg)  02/01/15 182 lb 8 oz (82.781 kg)    General: Appears her stated age, well developed, well nourished in NAD. Skin: Warm, dry and intact. No rashes, lesions or ulcerations noted. HEENT: Head: normal shape and size; Eyes: sclera white, no icterus, conjunctiva pink, PERRLA and EOMs intact; Ears: Tm's gray and intact, normal light reflex; Throat/Mouth: Teeth present, mucosa pink and moist, no exudate, lesions or ulcerations noted.  Neck:  Neck supple, trachea midline. No masses, lumps or thyromegaly present.  Cardiovascular: Normal rate and rhythm. S1,S2 noted.  No murmur, rubs or gallops noted. No JVD or BLE edema. No carotid bruits noted. Pulmonary/Chest: Normal effort and positive vesicular breath sounds. No respiratory distress. No wheezes, rales or ronchi noted.  Abdomen: Soft and nontender. Normal bowel sounds. No distention or masses noted. Liver, spleen and kidneys non palpable. Musculoskeletal: Stength 5/5 BUE/BLE. No signs of joint swelling. No difficulty with gait.  Neurological: Alert and oriented. Cranial nerves II-XII grossly intact. Coordination normal.  Psychiatric: Mood and affect normal. Behavior is normal. Judgment and thought content normal.    BMET    Component Value Date/Time   NA 136 03/24/2015 1524   K 3.8 03/24/2015 1524   CL 99 03/24/2015 1524   CO2 28 03/24/2015 1524   GLUCOSE 91 03/24/2015 1524   BUN 21 03/24/2015 1524   CREATININE 0.88 03/24/2015 1524   CREATININE 0.65  01/07/2014 1402   CALCIUM 9.8 03/24/2015 1524   GFRNONAA >89 01/07/2014 1402   GFRNONAA >60 01/30/2011 0924   GFRAA >89 01/07/2014 1402   GFRAA >60 01/30/2011 0924    Lipid Panel     Component Value Date/Time   CHOL 186 03/24/2015 1524   TRIG 172.0* 03/24/2015 1524   HDL 55.60 03/24/2015 1524   CHOLHDL 3 03/24/2015 1524   VLDL 34.4 03/24/2015 1524   LDLCALC 96 03/24/2015 1524    CBC    Component Value Date/Time   WBC 10.9* 03/24/2015 1524   RBC 4.70 03/24/2015 1524   HGB 13.6 03/24/2015 1524   HCT 40.7 03/24/2015 1524   PLT 278.0 03/24/2015 1524  MCV 86.7 03/24/2015 1524   MCH 29.2 01/07/2014 1402   MCHC 33.4 03/24/2015 1524   RDW 13.1 03/24/2015 1524   LYMPHSABS 3.1 01/07/2014 1402   MONOABS 0.6 01/07/2014 1402   EOSABS 0.1 01/07/2014 1402   BASOSABS 0.0 01/07/2014 1402    Hgb A1C Lab Results  Component Value Date   HGBA1C 10.0* 03/24/2015        Assessment & Plan:   Preventative Health Maintenance:  Flu and Tetanus UTD Pneumovax today Pap, Mammogram and Colonoscopy UTD Advised her to continue to see an eye doctor and dentist annually Encouraged her to consume a balanced diet and continue to exercise  RTC in 6 months to follow up chronic conditions.  HPI:  Pt presents to the clinic today to establish care. She is transferring care from [place], [place] in [place].  HLD: Lipid profile 01/2014 reviewed. Total 213, HDL 51, LDL 125.3 Triglycerides of 311. She is on Tricor, Livalo, Fish Oil and taking as prescribed.  DM2: A1C 12/2014 reviewed- 10.5%. Her fasting sugars are less than 130. She is on Januvia and Humulin. Flu vaccine 01/2015. Her pneumonia vaccine was 2011. Her last eye exam was 07/2014. Foot exam 07/2013. She is seeing endocrinology but would rather be followed here for her diabetes.Marland Kitchen  GERD: Symptoms well controlled on Protonix.  Review of Systems:  Past Medical History  Diagnosis Date  . Complication of anesthesia     woke during  surgery (ablation) 2006?  Marland Kitchen Blood transfusion     1984   [place]  . Chronic kidney disease     occasional renal stone  . Hypercholesteremia     on meds  . Diabetes mellitus     type 2 Diab- A1C-7  . GERD (gastroesophageal reflux disease)     occasional Protonix-will take protonix night before surgery    Current Outpatient Prescriptions  Medication Sig Dispense Refill  . B-D UF III MINI PEN NEEDLES 31G X 5 MM MISC 2 (two) times daily. as directed  6  . fenofibrate (TRICOR) 145 MG tablet Take 145 mg by mouth daily.  6  . HUMULIN 70/30 KWIKPEN (70-30) 100 UNIT/ML PEN Inject 30 Units into the skin 2 (two) times daily.  3  . JANUVIA 100 MG tablet Take 100 mg by mouth daily.  3  . LIVALO 2 MG TABS TAKE 1 TABLET DAILY FOR CHOLESTEROL 30 tablet 0  . Multiple Vitamins-Minerals (MULTIVITAMINS THER. W/MINERALS) TABS Take 1 tablet by mouth daily.      . Omega-3 Fatty Acids (FISH OIL) 500 MG CAPS Take 500 mg by mouth daily.    Marland Kitchen OVER THE COUNTER MEDICATION Take 1 tablet by mouth 2 (two) times daily. PATIENT TAKES CINNAMON TABLETS 1000mg     . pantoprazole (PROTONIX) 40 MG tablet Take 1 tablet by mouth daily.  3   No current facility-administered medications for this visit.    Allergies  Allergen Reactions  . Invokana [Canagliflozin]     Skin peelin  . Jardiance [Empagliflozin]     Myalgias    Family History  Problem Relation Age of Onset  . Hypertension Mother   . Hyperlipidemia Mother   . Heart disease Mother   . Heart disease Father   . Hyperlipidemia Father   . Diabetes Father   . Heart attack Father   . Varicose Veins Father   . Peripheral vascular disease Father     amputation  . Cancer Paternal Aunt     lung/ brain    Social  History   Social History  . Marital Status: Married    Spouse Name: N/A  . Number of Children: N/A  . Years of Education: N/A   Occupational History  . Not on file.   Social History Main Topics  . Smoking status: Former Smoker -- 0.75  packs/day for 30 years    Types: Cigarettes  . Smokeless tobacco: Never Used     Comment: recently quit  . Alcohol Use: No  . Drug Use: No  . Sexual Activity: Yes   Other Topics Concern  . Not on file   Social History Narrative     Constitutional: Denies fever, malaise, fatigue, headache or abrupt weight changes.  HEENT: Denies eye pain, eye redness, ear pain, ringing in the ears, wax buildup, runny nose, nasal congestion, bloody nose, or sore throat. Respiratory: Denies difficulty breathing, shortness of breath, cough or sputum production.   Cardiovascular: Denies chest pain, chest tightness, palpitations or swelling in the hands or feet.  Gastrointestinal: Denies abdominal pain, bloating, constipation, diarrhea or blood in the stool.  GU: Denies urgency, frequency, pain with urination, burning sensation, blood in urine, odor or discharge. Musculoskeletal: Denies decrease in range of motion, difficulty with gait, muscle pain or joint pain and swelling.  Skin: Denies redness, rashes, lesions or ulcercations.  Neurological: Denies dizziness, difficulty with memory, difficulty with speech or problems with balance and coordination.  Psych: Denies anxiety, depression, SI/HI.   No other specific complaints in a complete review of systems (except as listed in HPI above).  Objective:  BP 110/68 mmHg  Pulse 85  Temp(Src) 97.5 F (36.4 C) (Oral)  Ht 5\' 3"  (1.6 m)  Wt 187 lb (84.823 kg)  BMI 33.13 kg/m2  SpO2 97%  General: Appears her stated age, well developed, well nourished in NAD. Skin: Warm, dry and intact. No rashes, lesions or ulcerations noted. Cardiovascular: Normal rate and rhythm. S1,S2 noted.  No murmur, rubs or gallops noted. No JVD or BLE edema. No carotid bruits noted. Pulmonary/Chest: Normal effort and positive vesicular breath sounds. No respiratory distress. No wheezes, rales or ronchi noted.  Abdomen: Soft and nontender. Normal bowel sounds. No distention or  masses noted. Liver, spleen and kidneys non palpable. Neurological: Alert and oriented. Cranial nerves II-XII grossly intact. Coordination normal.  Psychiatric: Mood and affect normal. Behavior is normal. Judgment and thought content normal.   Assessment and Plan:

## 2015-03-24 NOTE — Assessment & Plan Note (Signed)
Uncontrolled Will repeat A1C and microalubumin today Encouraged her to consume a low fat low carb diet Flu shot UTD Pneumovax today Foot exam today

## 2015-03-24 NOTE — Assessment & Plan Note (Signed)
Will check Lipid Profile and CMET today Encouraged her to consume a low fat, low carb diet Encouraged her to continue Tricor, Livalo, and Fish Oil Advised her to take an ASA daily

## 2015-03-25 MED ORDER — METFORMIN HCL 1000 MG PO TABS: 1000.0000 mg | ORAL_TABLET | Freq: Two times a day (BID) | ORAL | Status: DC

## 2015-03-25 MED ORDER — HUMULIN 70/30 KWIKPEN (70-30) 100 UNIT/ML ~~LOC~~ SUPN: 40.0000 [IU] | PEN_INJECTOR | Freq: Two times a day (BID) | SUBCUTANEOUS | Status: DC

## 2015-03-25 NOTE — Addendum Note (Signed)
Addended by: Lurlean Nanny on: 03/25/2015 05:06 PM   Modules accepted: Orders

## 2015-03-30 NOTE — Addendum Note (Signed)
Addended by: Lurlean Nanny on: 03/30/2015 10:12 AM   Modules accepted: Orders

## 2015-05-09 ENCOUNTER — Other Ambulatory Visit: Payer: Self-pay | Admitting: Internal Medicine

## 2015-06-11 ENCOUNTER — Other Ambulatory Visit: Payer: Self-pay | Admitting: Internal Medicine

## 2015-06-29 ENCOUNTER — Other Ambulatory Visit (INDEPENDENT_AMBULATORY_CARE_PROVIDER_SITE_OTHER): Payer: Commercial Managed Care - HMO

## 2015-06-29 DIAGNOSIS — E119 Type 2 diabetes mellitus without complications: Secondary | ICD-10-CM | POA: Diagnosis not present

## 2015-06-29 LAB — HEMOGLOBIN A1C: Hgb A1c MFr Bld: 9 % — ABNORMAL HIGH (ref 4.6–6.5)

## 2015-06-30 ENCOUNTER — Encounter: Payer: Self-pay | Admitting: Internal Medicine

## 2015-06-30 ENCOUNTER — Ambulatory Visit (INDEPENDENT_AMBULATORY_CARE_PROVIDER_SITE_OTHER): Payer: Commercial Managed Care - HMO | Admitting: Internal Medicine

## 2015-06-30 ENCOUNTER — Ambulatory Visit: Payer: Commercial Managed Care - HMO | Admitting: Internal Medicine

## 2015-06-30 VITALS — BP 106/64 | HR 108 | Temp 98.5°F | Wt 194.0 lb

## 2015-06-30 DIAGNOSIS — J3489 Other specified disorders of nose and nasal sinuses: Secondary | ICD-10-CM

## 2015-06-30 MED ORDER — GLIPIZIDE 10 MG PO TABS: 10.0000 mg | ORAL_TABLET | Freq: Two times a day (BID) | ORAL | Status: DC

## 2015-06-30 MED ORDER — MUPIROCIN 2 % EX OINT: 1.0000 "application " | TOPICAL_OINTMENT | Freq: Two times a day (BID) | CUTANEOUS | Status: DC

## 2015-06-30 NOTE — Progress Notes (Signed)
Subjective:    Patient ID: [name], female    DOB: 1955/11/28, 60 y.o.   MRN: [id number]  HPI  Pt presents to the clinic today with c/o a sore in her nose. She noticed this a few months. The area is very tender. It has intermittently been bleeding. She has been using antibacterial ointment on the area. She is concerned because her grandson is a MRSA carrier.  Review of Systems      Past Medical History  Diagnosis Date  . Complication of anesthesia     woke during surgery (ablation) 2006?  Marland Kitchen Blood transfusion     1984   [name]  . Chronic kidney disease     occasional renal stone  . Hypercholesteremia     on meds  . Diabetes mellitus     type 2 Diab- A1C-7  . GERD (gastroesophageal reflux disease)     occasional Protonix-will take protonix night before surgery    Current Outpatient Prescriptions  Medication Sig Dispense Refill  . B-D UF III MINI PEN NEEDLES 31G X 5 MM MISC 2 (two) times daily. as directed  6  . fenofibrate (TRICOR) 145 MG tablet Take 145 mg by mouth daily.  6  . HUMULIN 70/30 KWIKPEN (70-30) 100 UNIT/ML PEN Inject 40 Units into the skin 2 (two) times daily. 24 mL 3  . JANUVIA 100 MG tablet Take 100 mg by mouth daily.  3  . LIVALO 2 MG TABS TAKE 1 TABLET DAILY FOR CHOLESTEROL 30 tablet 5  . metFORMIN (GLUCOPHAGE) 1000 MG tablet Take 1 tablet (1,000 mg total) by mouth 2 (two) times daily with a meal. 180 tablet 1  . Multiple Vitamins-Minerals (MULTIVITAMINS THER. W/MINERALS) TABS Take 1 tablet by mouth daily.      . Omega-3 Fatty Acids (FISH OIL) 500 MG CAPS Take 500 mg by mouth daily.    Marland Kitchen OVER THE COUNTER MEDICATION Take 1 tablet by mouth 2 (two) times daily. PATIENT TAKES CINNAMON TABLETS 1000mg     . pantoprazole (PROTONIX) 40 MG tablet Take 1 tablet by mouth daily.  3   No current facility-administered medications for this visit.    Allergies  Allergen Reactions  . Invokana [Canagliflozin]     Skin peelin  . Jardiance [Empagliflozin]     Myalgias     Family History  Problem Relation Age of Onset  . Hypertension Mother   . Hyperlipidemia Mother   . Heart disease Mother   . Heart disease Father   . Hyperlipidemia Father   . Diabetes Father   . Heart attack Father   . Varicose Veins Father   . Peripheral vascular disease Father     amputation  . Cancer Paternal Aunt     lung/ brain    Social History   Social History  . Marital Status: Married    Spouse Name: N/A  . Number of Children: N/A  . Years of Education: N/A   Occupational History  . Not on file.   Social History Main Topics  . Smoking status: Former Smoker -- 0.75 packs/day for 30 years    Types: Cigarettes  . Smokeless tobacco: Never Used     Comment: recently quit  . Alcohol Use: No  . Drug Use: No  . Sexual Activity: Yes   Other Topics Concern  . Not on file   Social History Narrative     Constitutional: Denies fever, malaise, fatigue, headache or abrupt weight changes.  HEENT: Denies eye pain, eye  redness, ear pain, ringing in the ears, wax buildup, runny nose, nasal congestion, bloody nose, or sore throat. Respiratory: Denies difficulty breathing, shortness of breath, cough or sputum production.   Cardiovascular: Denies chest pain, chest tightness, palpitations or swelling in the hands or feet.  Skin: Pt reports sore in her nose. Denies redness, rashes, or ulcercations.    No other specific complaints in a complete review of systems (except as listed in HPI above).  Objective:   Physical Exam  BP 106/64 mmHg  Pulse 108  Temp(Src) 98.5 F (36.9 C) (Oral)  Wt 194 lb (87.998 kg)  SpO2 97% Wt Readings from Last 3 Encounters:  06/30/15 194 lb (87.998 kg)  03/24/15 187 lb (84.823 kg)  02/04/15 185 lb (83.915 kg)    General: Appears her stated age, obese in NAD. Skin: Warm, dry and intact.  HEENT: Head: normal shape and size; Nose: mucosa pink and dry, septum midline. Small crusted lesion noted medially, right nare. Neck:  No  adenopathy noted. Cardiovascular: Normal rate and rhythm. S1,S2 noted.  No murmur, rubs or gallops noted.  Pulmonary/Chest: Normal effort and positive vesicular breath sounds. No respiratory distress. No wheezes, rales or ronchi noted.   BMET    Component Value Date/Time   NA 136 03/24/2015 1524   K 3.8 03/24/2015 1524   CL 99 03/24/2015 1524   CO2 28 03/24/2015 1524   GLUCOSE 91 03/24/2015 1524   BUN 21 03/24/2015 1524   CREATININE 0.88 03/24/2015 1524   CREATININE 0.65 01/07/2014 1402   CALCIUM 9.8 03/24/2015 1524   GFRNONAA >89 01/07/2014 1402   GFRNONAA >60 01/30/2011 0924   GFRAA >89 01/07/2014 1402   GFRAA >60 01/30/2011 0924    Lipid Panel     Component Value Date/Time   CHOL 186 03/24/2015 1524   TRIG 172.0* 03/24/2015 1524   HDL 55.60 03/24/2015 1524   CHOLHDL 3 03/24/2015 1524   VLDL 34.4 03/24/2015 1524   LDLCALC 96 03/24/2015 1524    CBC    Component Value Date/Time   WBC 10.9* 03/24/2015 1524   RBC 4.70 03/24/2015 1524   HGB 13.6 03/24/2015 1524   HCT 40.7 03/24/2015 1524   PLT 278.0 03/24/2015 1524   MCV 86.7 03/24/2015 1524   MCH 29.2 01/07/2014 1402   MCHC 33.4 03/24/2015 1524   RDW 13.1 03/24/2015 1524   LYMPHSABS 3.1 01/07/2014 1402   MONOABS 0.6 01/07/2014 1402   EOSABS 0.1 01/07/2014 1402   BASOSABS 0.0 01/07/2014 1402    Hgb A1C Lab Results  Component Value Date   HGBA1C 9.0* 06/29/2015         Assessment & Plan:   Nasal sore:  eRx for Mupirocin ointment BID until resolved Can use nasal saline for extra moisture Avoid putting your fingers inside your nose, use a Qtip to apply medication  RTC as needed or if symptoms persist or worsen

## 2015-06-30 NOTE — Progress Notes (Signed)
Pre visit review using our clinic review tool, if applicable. No additional management support is needed unless otherwise documented below in the visit note. 

## 2015-06-30 NOTE — Addendum Note (Signed)
Addended by: Lurlean Nanny on: 06/30/2015 02:46 PM   Modules accepted: Orders

## 2015-06-30 NOTE — Patient Instructions (Signed)
Mupirocin nasal ointment What is this medicine? MUPIROCIN CALCIUM (myoo PEER oh sin KAL see um) is an antibiotic. It is used inside the nose to treat infections that are caused by certain bacteria. This helps prevent the spread of infection to patients and health care workers during outbreaks at institutions. This medicine may be used for other purposes; ask your health care provider or pharmacist if you have questions. What should I tell my health care provider before I take this medicine? They need to know if you have any of these conditions: -an unusual or allergic reaction to mupirocin, other medicines, foods, dyes, or preservatives -pregnant or trying to get pregnant -breast-feeding How should I use this medicine? This medicine is only for use inside the nose. Follow the directions on the prescription label. Wash your hands before and after use. Squeeze half the contents of a single-use tube into one nostril, then squeeze the other half into the other nostril. Press the sides of your nose together and gently massage after application to spread the ointment throughout the nostrils. Do not use your medicine more often than directed. Finish the full course of medicine prescribed by your doctor or health care professional even if you think your condition is better. Talk to your pediatrician regarding the use of this medicine in children. Special care may be needed. Overdosage: If you think you have taken too much of this medicine contact a poison control center or emergency room at once. NOTE: This medicine is only for you. Do not share this medicine with others. What if I miss a dose? If you miss a dose, take it as soon as you can. If it is almost time for your next dose, take only that dose. Do not take double or extra doses. What may interact with this medicine? Interactions are not expected. Do not use any other nose products without telling your doctor or health care professional. This list  may not describe all possible interactions. Give your health care provider a list of all the medicines, herbs, non-prescription drugs, or dietary supplements you use. Also tell them if you smoke, drink alcohol, or use illegal drugs. Some items may interact with your medicine. What should I watch for while using this medicine? If your nose is severely irritated, burning or stinging from use of this medicine, stop using it and contact your doctor or health care professional. Do not get this medicine in your eyes. If you do, rinse out with plenty of cool tap water. What side effects may I notice from receiving this medicine? Side effects that you should report to your doctor or health care professional as soon as possible: -severe irritation, burning, stinging, or pain Side effects that usually do not require medical attention (report to your doctor or health care professional if they continue or are bothersome): -altered taste -cough -headache -skin itching -sore throat -stuffy or runny nose This list may not describe all possible side effects. Call your doctor for medical advice about side effects. You may report side effects to FDA at 1-800-FDA-1088. Where should I keep my medicine? Keep out of the reach of children. Store at room temperature between 15 and 30 degrees C (59 and 86 degrees F). Do not refrigerate. One tube of ointment is for single use in both nostrils. Throw away after use. NOTE: This sheet is a summary. It may not cover all possible information. If you have questions about this medicine, talk to your doctor, pharmacist, or health care provider.  2016, [employer]. (2007-11-10 14:36:10)

## 2015-07-01 ENCOUNTER — Encounter: Payer: Self-pay | Admitting: Internal Medicine

## 2015-07-15 LAB — HM MAMMOGRAPHY: HM MAMMO: NORMAL (ref 0–4)

## 2015-08-05 ENCOUNTER — Other Ambulatory Visit: Payer: Self-pay | Admitting: *Deleted

## 2015-08-05 MED ORDER — GLUCOSE BLOOD VI STRP: ORAL_STRIP | Status: DC

## 2015-08-06 LAB — HM DIABETES EYE EXAM

## 2015-08-14 ENCOUNTER — Other Ambulatory Visit: Payer: Self-pay | Admitting: Internal Medicine

## 2015-08-17 ENCOUNTER — Telehealth: Payer: Self-pay

## 2015-08-17 NOTE — Telephone Encounter (Signed)
Pt left /vm; several people pt comes in contact with have tested positive for flu; pt wants to know if can get tamiflu; no symptoms at this time; for preventative use. Pt request cb. [facility].

## 2015-08-18 ENCOUNTER — Other Ambulatory Visit: Payer: Self-pay | Admitting: Internal Medicine

## 2015-08-18 MED ORDER — OSELTAMIVIR PHOSPHATE 75 MG PO CAPS: 75.0000 mg | ORAL_CAPSULE | Freq: Every day | ORAL | Status: DC

## 2015-08-18 NOTE — Telephone Encounter (Signed)
Pt notified Rx sent to pharmacy

## 2015-08-18 NOTE — Telephone Encounter (Signed)
Tamiflu sent to pharmacy

## 2015-08-24 ENCOUNTER — Encounter: Payer: Self-pay | Admitting: Primary Care

## 2015-08-24 ENCOUNTER — Ambulatory Visit (INDEPENDENT_AMBULATORY_CARE_PROVIDER_SITE_OTHER): Payer: Commercial Managed Care - HMO | Admitting: Primary Care

## 2015-08-24 VITALS — BP 120/72 | HR 80 | Temp 97.9°F | Wt 198.0 lb

## 2015-08-24 DIAGNOSIS — R05 Cough: Secondary | ICD-10-CM | POA: Diagnosis not present

## 2015-08-24 DIAGNOSIS — R059 Cough, unspecified: Secondary | ICD-10-CM

## 2015-08-24 MED ORDER — BENZONATATE 200 MG PO CAPS: 200.0000 mg | ORAL_CAPSULE | Freq: Three times a day (TID) | ORAL | Status: DC | PRN

## 2015-08-24 MED ORDER — AZITHROMYCIN 250 MG PO TABS: ORAL_TABLET | ORAL | Status: DC

## 2015-08-24 NOTE — Patient Instructions (Signed)
Start Azithromycin antibiotics. Take 2 tablets by mouth today, then 1 tablet daily for 4 additional days.  You may take Benzonatate capsules for cough. Take 1 capsule by mouth three times daily as needed for cough.  Ensure you are staying hydrated with water.   Please notify me if no improvement in symptoms in 3-4 days.  It was a pleasure meeting you!  Acute Bronchitis Bronchitis is inflammation of the airways that extend from the windpipe into the lungs (bronchi). The inflammation often causes mucus to develop. This leads to a cough, which is the most common symptom of bronchitis.  In acute bronchitis, the condition usually develops suddenly and goes away over time, usually in a couple weeks. Smoking, allergies, and asthma can make bronchitis worse. Repeated episodes of bronchitis may cause further lung problems.  CAUSES Acute bronchitis is most often caused by the same virus that causes a cold. The virus can spread from person to person (contagious) through coughing, sneezing, and touching contaminated objects. SIGNS AND SYMPTOMS   Cough.   Fever.   Coughing up mucus.   Body aches.   Chest congestion.   Chills.   Shortness of breath.   Sore throat.  DIAGNOSIS  Acute bronchitis is usually diagnosed through a physical exam. Your health care provider will also ask you questions about your medical history. Tests, such as chest X-rays, are sometimes done to rule out other conditions.  TREATMENT  Acute bronchitis usually goes away in a couple weeks. Oftentimes, no medical treatment is necessary. Medicines are sometimes given for relief of fever or cough. Antibiotic medicines are usually not needed but may be prescribed in certain situations. In some cases, an inhaler may be recommended to help reduce shortness of breath and control the cough. A cool mist vaporizer may also be used to help thin bronchial secretions and make it easier to clear the chest.  HOME CARE  INSTRUCTIONS  Get plenty of rest.   Drink enough fluids to keep your urine clear or pale yellow (unless you have a medical condition that requires fluid restriction). Increasing fluids may help thin your respiratory secretions (sputum) and reduce chest congestion, and it will prevent dehydration.   Take medicines only as directed by your health care provider.  If you were prescribed an antibiotic medicine, finish it all even if you start to feel better.  Avoid smoking and secondhand smoke. Exposure to cigarette smoke or irritating chemicals will make bronchitis worse. If you are a smoker, consider using nicotine gum or skin patches to help control withdrawal symptoms. Quitting smoking will help your lungs heal faster.   Reduce the chances of another bout of acute bronchitis by washing your hands frequently, avoiding people with cold symptoms, and trying not to touch your hands to your mouth, nose, or eyes.   Keep all follow-up visits as directed by your health care provider.  SEEK MEDICAL CARE IF: Your symptoms do not improve after 1 week of treatment.  SEEK IMMEDIATE MEDICAL CARE IF:  You develop an increased fever or chills.   You have chest pain.   You have severe shortness of breath.  You have bloody sputum.   You develop dehydration.  You faint or repeatedly feel like you are going to pass out.  You develop repeated vomiting.  You develop a severe headache. MAKE SURE YOU:   Understand these instructions.  Will watch your condition.  Will get help right away if you are not doing well or get worse.  This information is not intended to replace advice given to you by your health care provider. Make sure you discuss any questions you have with your health care provider.   Document Released: 05/31/2004 Document Revised: 05/14/2014 Document Reviewed: 10/14/2012 Elsevier Interactive Patient Education [employer].

## 2015-08-24 NOTE — Progress Notes (Signed)
Pre visit review using our clinic review tool, if applicable. No additional management support is needed unless otherwise documented below in the visit note. 

## 2015-08-24 NOTE — Progress Notes (Signed)
Subjective:    Patient ID: [name], female    DOB: 1955/08/30, 60 y.o.   MRN: [id number]  HPI  [name] is a 60 year old female who presents today with a chief complaint of cough. She also reports chest congestion. She's been exposed to the flu and pneumonia by her husband who has struggled with both of these in the past 2 weeks. Her symptoms began 2 weeks ago. She's taken Tessalon Pearls, cough medication with codeine, [place] alcohol without improvement. Denies fevers, body aches, chills, sore throat.  Review of Systems  Constitutional: Negative for fever, chills and fatigue.  HENT: Positive for congestion. Negative for sore throat.   Respiratory: Positive for cough. Negative for shortness of breath and wheezing.   Cardiovascular: Negative for chest pain.  Musculoskeletal: Negative for myalgias.       Past Medical History  Diagnosis Date  . Complication of anesthesia     woke during surgery (ablation) 2006?  Marland Kitchen Blood transfusion     1984   [place]  . Chronic kidney disease     occasional renal stone  . Hypercholesteremia     on meds  . Diabetes mellitus     type 2 Diab- A1C-7  . GERD (gastroesophageal reflux disease)     occasional Protonix-will take protonix night before surgery     Social History   Social History  . Marital Status: Married    Spouse Name: N/A  . Number of Children: N/A  . Years of Education: N/A   Occupational History  . Not on file.   Social History Main Topics  . Smoking status: Former Smoker -- 0.75 packs/day for 30 years    Types: Cigarettes  . Smokeless tobacco: Never Used     Comment: recently quit 03/01/2015  . Alcohol Use: No  . Drug Use: No  . Sexual Activity: Yes   Other Topics Concern  . Not on file   Social History Narrative    Past Surgical History  Procedure Laterality Date  . Uterine ablation    . Cervical disc surgery  2004  . Transvaginal tape  8/12  . Bladder suspension  12/08/2010    Procedure: TRANSVAGINAL  TAPE (TVT) PROCEDURE;  Surgeon: Arloa Koh;  Location: [facility];  Service: Gynecology;  Laterality: N/A;  transvaginal tape with cystoscopy  . Bladder suspension  01/30/2011    Procedure: TRANSVAGINAL TAPE (TVT) PROCEDURE;  Surgeon: Arloa Koh;  Location: [facility];  Service: Gynecology;  Laterality: N/A;  excision of excess sling from suprapubic incision and cystoscopy  . Cystoscopy  01/30/2011    Procedure: CYSTOSCOPY;  Surgeon: Arloa Koh;  Location: [facility];  Service: Gynecology;  Laterality: N/A;  . Tonsillectomy  1976    Family History  Problem Relation Age of Onset  . Hypertension Mother   . Hyperlipidemia Mother   . Heart disease Mother   . Heart disease Father   . Hyperlipidemia Father   . Diabetes Father   . Heart attack Father   . Varicose Veins Father   . Peripheral vascular disease Father     amputation  . Cancer Paternal Aunt     lung/ brain    Allergies  Allergen Reactions  . Invokana [Canagliflozin]     Skin peelin  . Jardiance [Empagliflozin]     Myalgias    Current Outpatient Prescriptions on File Prior to Visit  Medication Sig Dispense Refill  . B-D UF III MINI PEN NEEDLES  31G X 5 MM MISC 2 (two) times daily. as directed  6  . fenofibrate (TRICOR) 145 MG tablet Take 145 mg by mouth daily.  6  . glipiZIDE (GLUCOTROL) 10 MG tablet Take 1 tablet (10 mg total) by mouth 2 (two) times daily before a meal. 60 tablet 2  . glucose blood (FREESTYLE LITE) test strip Use as instructed to test blood sugar once daily E11.9 100 each 12  . HUMULIN 70/30 KWIKPEN (70-30) 100 UNIT/ML PEN INJECT 40 UNITS INTO THE SKIN 2 (TWO) TIMES DAILY. 24 mL 1  . JANUVIA 100 MG tablet Take 100 mg by mouth daily.  3  . LIVALO 2 MG TABS TAKE 1 TABLET DAILY FOR CHOLESTEROL 30 tablet 5  . metFORMIN (GLUCOPHAGE) 1000 MG tablet Take 1 tablet (1,000 mg total) by mouth 2 (two) times daily with a meal. 180 tablet 1  . Multiple Vitamins-Minerals (MULTIVITAMINS THER. W/MINERALS) TABS Take 1 tablet  by mouth daily.      . mupirocin ointment (BACTROBAN) 2 % Place 1 application into the nose 2 (two) times daily. 22 g 0  . Omega-3 Fatty Acids (FISH OIL) 500 MG CAPS Take 500 mg by mouth daily.    Marland Kitchen oseltamivir (TAMIFLU) 75 MG capsule Take 1 capsule (75 mg total) by mouth daily. 10 capsule 0  . OVER THE COUNTER MEDICATION Take 1 tablet by mouth 2 (two) times daily. PATIENT TAKES CINNAMON TABLETS 1000mg     . pantoprazole (PROTONIX) 40 MG tablet Take 1 tablet by mouth daily.  3   No current facility-administered medications on file prior to visit.    BP 120/72 mmHg  Pulse 80  Temp(Src) 97.9 F (36.6 C) (Oral)  Wt 198 lb (89.812 kg)  SpO2 97%    Objective:   Physical Exam  Constitutional: She appears well-nourished.  HENT:  Right Ear: Tympanic membrane and ear canal normal.  Left Ear: Tympanic membrane and ear canal normal.  Nose: Right sinus exhibits no maxillary sinus tenderness and no frontal sinus tenderness. Left sinus exhibits no maxillary sinus tenderness and no frontal sinus tenderness.  Mouth/Throat: Oropharynx is clear and moist.  Eyes: Conjunctivae are normal.  Neck: Neck supple.  Cardiovascular: Normal rate and regular rhythm.   Pulmonary/Chest: Effort normal. She has no wheezes. She has rales.  Lymphadenopathy:    She has no cervical adenopathy.  Skin: Skin is warm and dry.          Assessment & Plan:  Cough:  Present for 2 weeks. Managed on PPI, prior smoker.  Her husband has had both the flu and pneumonia in the past 2 weeks. No improvement in cough with RX cough suppressants and OTC treatment.  Exam mostly unremarkable. No wheezing. Rales noted mildly throughout. Given exposure to flu and pneumonia, duration of symptoms, and lack of improvement in cough, will treat with antibiotics. RX for Zpak and Tessalon Pearls sent to pharmacy. Fluids, rest, return precautions provided.

## 2015-09-09 ENCOUNTER — Other Ambulatory Visit: Payer: Self-pay | Admitting: Internal Medicine

## 2015-09-27 ENCOUNTER — Other Ambulatory Visit: Payer: Self-pay | Admitting: Internal Medicine

## 2015-10-24 ENCOUNTER — Other Ambulatory Visit: Payer: Self-pay | Admitting: Internal Medicine

## 2015-10-25 ENCOUNTER — Ambulatory Visit (INDEPENDENT_AMBULATORY_CARE_PROVIDER_SITE_OTHER): Payer: Commercial Managed Care - HMO | Admitting: Internal Medicine

## 2015-10-25 ENCOUNTER — Encounter: Payer: Self-pay | Admitting: Internal Medicine

## 2015-10-25 VITALS — BP 126/80 | HR 76 | Temp 97.8°F | Wt 196.5 lb

## 2015-10-25 DIAGNOSIS — J029 Acute pharyngitis, unspecified: Secondary | ICD-10-CM | POA: Diagnosis not present

## 2015-10-25 DIAGNOSIS — J069 Acute upper respiratory infection, unspecified: Secondary | ICD-10-CM

## 2015-10-25 DIAGNOSIS — J309 Allergic rhinitis, unspecified: Secondary | ICD-10-CM | POA: Diagnosis not present

## 2015-10-25 DIAGNOSIS — B9789 Other viral agents as the cause of diseases classified elsewhere: Principal | ICD-10-CM

## 2015-10-25 LAB — POCT RAPID STREP A (OFFICE): RAPID STREP A SCREEN: NEGATIVE

## 2015-10-25 MED ORDER — HYDROCOD POLST-CPM POLST ER 10-8 MG/5ML PO SUER: 5.0000 mL | Freq: Every evening | ORAL | Status: DC | PRN

## 2015-10-25 NOTE — Progress Notes (Signed)
HPI  Pt presents to the clinic today with c/o runny nose, sore throat and cough. This started 5 days ago. She is blowing clear mucous out of her nose. She is having a little difficulty swallowing. The cough is non productive. She denies fever, chills or body aches. She has tried salt water gargles without any relief. She has had sick contacts diagnosed with strep throat last week.  Review of Systems      Past Medical History  Diagnosis Date  . Complication of anesthesia     woke during surgery (ablation) 2006?  Marland Kitchen Blood transfusion     1984   [place]  . Chronic kidney disease     occasional renal stone  . Hypercholesteremia     on meds  . Diabetes mellitus     type 2 Diab- A1C-7  . GERD (gastroesophageal reflux disease)     occasional Protonix-will take protonix night before surgery    Family History  Problem Relation Age of Onset  . Hypertension Mother   . Hyperlipidemia Mother   . Heart disease Mother   . Heart disease Father   . Hyperlipidemia Father   . Diabetes Father   . Heart attack Father   . Varicose Veins Father   . Peripheral vascular disease Father     amputation  . Cancer Paternal Aunt     lung/ brain    Social History   Social History  . Marital Status: Married    Spouse Name: N/A  . Number of Children: N/A  . Years of Education: N/A   Occupational History  . Not on file.   Social History Main Topics  . Smoking status: Former Smoker -- 0.75 packs/day for 30 years    Types: Cigarettes  . Smokeless tobacco: Never Used     Comment: recently quit 03/01/2015  . Alcohol Use: No  . Drug Use: No  . Sexual Activity: Yes   Other Topics Concern  . Not on file   Social History Narrative    Allergies  Allergen Reactions  . Invokana [Canagliflozin]     Skin peelin  . Jardiance [Empagliflozin]     Myalgias     Constitutional: Denies headache, fatigue, fever or abrupt weight changes.  HEENT:  Positive runny nose, sore throat. Denies eye redness,  eye pain, pressure behind the eyes, facial pain, nasal congestion, ear pain, ringing in the ears, wax buildup, runny nose or bloody nose. Respiratory: Positive cough. Denies difficulty breathing or shortness of breath.  Cardiovascular: Denies chest pain, chest tightness, palpitations or swelling in the hands or feet.   No other specific complaints in a complete review of systems (except as listed in HPI above).  Objective:   BP 126/80 mmHg  Pulse 76  Temp(Src) 97.8 F (36.6 C) (Oral)  Wt 196 lb 8 oz (89.132 kg)  SpO2 97% Wt Readings from Last 3 Encounters:  10/25/15 196 lb 8 oz (89.132 kg)  08/24/15 198 lb (89.812 kg)  06/30/15 194 lb (87.998 kg)     General: Appears her stated age, well developed, well nourished in NAD. HEENT: Head: normal shape and size, no sinus tenderness noted; Eyes: sclera white, no icterus, conjunctiva pink; Ears: Tm's gray and intact, normal light reflex; Throat/Mouth: + PND. Teeth present, mucosa pink and moist, no exudate noted, no lesions or ulcerations noted.  Neck: No cervical lymphadenopathy.  Cardiovascular: Normal rate and rhythm. S1,S2 noted.  No murmur, rubs or gallops noted.  Pulmonary/Chest: Normal effort and positive vesicular  breath sounds. No respiratory distress. No wheezes, rales or ronchi noted.      Assessment & Plan:   Viral Upper Respiratory Infection with Cough/Allergic Rhinits:  RST: negative Get some rest and drink plenty of water Do salt water gargles for the sore throat Start Zyrtec and Flonase OTC Rx for Tussionex cough syrup  RTC as needed or if symptoms persist.  Webb Silversmith, NP

## 2015-10-25 NOTE — Addendum Note (Signed)
Addended by: Lurlean Nanny on: 10/25/2015 12:11 PM   Modules accepted: Orders, SmartSet

## 2015-10-25 NOTE — Patient Instructions (Signed)

## 2015-10-25 NOTE — Progress Notes (Signed)
Pre visit review using our clinic review tool, if applicable. No additional management support is needed unless otherwise documented below in the visit note. 

## 2015-10-27 ENCOUNTER — Telehealth: Payer: Self-pay

## 2015-10-27 NOTE — Telephone Encounter (Signed)
Her strep test was negative. If she is worse, she needs to be reevaluated.

## 2015-10-27 NOTE — Telephone Encounter (Signed)
Pt left v/m; pt was seen 10/25/15; S/T is no better and very sore. Pt request med to [facility]. Please advise.

## 2015-10-28 ENCOUNTER — Ambulatory Visit (INDEPENDENT_AMBULATORY_CARE_PROVIDER_SITE_OTHER): Payer: Commercial Managed Care - HMO | Admitting: Primary Care

## 2015-10-28 ENCOUNTER — Encounter: Payer: Self-pay | Admitting: Primary Care

## 2015-10-28 VITALS — BP 118/68 | HR 90 | Temp 97.9°F | Wt 197.4 lb

## 2015-10-28 DIAGNOSIS — H938X3 Other specified disorders of ear, bilateral: Secondary | ICD-10-CM

## 2015-10-28 DIAGNOSIS — J029 Acute pharyngitis, unspecified: Secondary | ICD-10-CM | POA: Diagnosis not present

## 2015-10-28 DIAGNOSIS — H6123 Impacted cerumen, bilateral: Secondary | ICD-10-CM | POA: Diagnosis not present

## 2015-10-28 LAB — POCT RAPID STREP A (OFFICE): Rapid Strep A Screen: NEGATIVE

## 2015-10-28 MED ORDER — METHYLPREDNISOLONE ACETATE 80 MG/ML IJ SUSP
80.0000 mg | Freq: Once | INTRAMUSCULAR | Status: AC
  Administered 2015-10-28: 80 mg via INTRAMUSCULAR

## 2015-10-28 NOTE — Telephone Encounter (Signed)
Patient called back.  I let her know [name] wants her reevaluated.  [name] didn't have any openings, so I scheduled appointment with Anda Kraft today at 2:15.

## 2015-10-28 NOTE — Addendum Note (Signed)
Addended by: Jacqualin Combes on: 10/28/2015 03:23 PM   Modules accepted: Orders, SmartSet

## 2015-10-28 NOTE — Progress Notes (Signed)
Pre visit review using our clinic review tool, if applicable. No additional management support is needed unless otherwise documented below in the visit note. 

## 2015-10-28 NOTE — Patient Instructions (Signed)
Your ears and throat are not infected with a bacteria.  You were provided with a shot of steroids to help reduce inflammation and discomfort.  You can try Debrox drops in the future to prevent wax buildup.  It was a pleasure meeting you!  Cerumen Impaction The structures of the external ear canal secrete a waxy substance known as cerumen. Excess cerumen can build up in the ear canal, causing a condition known as cerumen impaction. Cerumen impaction can cause ear pain and disrupt the function of the ear. The rate of cerumen production differs for each individual. In certain individuals, the configuration of the ear canal may decrease his or her ability to naturally remove cerumen. CAUSES Cerumen impaction is caused by excessive cerumen production or buildup. RISK FACTORS  Frequent use of swabs to clean ears.  Having narrow ear canals.  Having eczema.  Being dehydrated. SIGNS AND SYMPTOMS  Diminished hearing.  Ear drainage.  Ear pain.  Ear itch. TREATMENT Treatment may involve:  Over-the-counter or prescription ear drops to soften the cerumen.  Removal of cerumen by a health care provider. This may be done with:  Irrigation with warm water. This is the most common method of removal.  Ear curettes and other instruments.  Surgery. This may be done in severe cases. HOME CARE INSTRUCTIONS  Take medicines only as directed by your health care provider.  Do not insert objects into the ear with the intent of cleaning the ear. PREVENTION  Do not insert objects into the ear, even with the intent of cleaning the ear. Removing cerumen as a part of normal hygiene is not necessary, and the use of swabs in the ear canal is not recommended.  Drink enough water to keep your urine clear or pale yellow.  Control your eczema if you have it. SEEK MEDICAL CARE IF:  You develop ear pain.  You develop bleeding from the ear.  The cerumen does not clear after you use ear drops as  directed.   This information is not intended to replace advice given to you by your health care provider. Make sure you discuss any questions you have with your health care provider.   Document Released: 05/31/2004 Document Revised: 05/14/2014 Document Reviewed: 12/08/2014 Elsevier Interactive Patient Education [employer].

## 2015-10-28 NOTE — Progress Notes (Signed)
Subjective:    Patient ID: [name], female    DOB: March 28, 1956, 60 y.o.   MRN: [id number]  HPI  [name] is a 60 year old female who presents today with a chief complaint of sore throat. She also reports ear fullness. Her symptoms began on 06/13 after coming home from the beach. She was evaluated on 10/25/15 with same complaints with negative strep. She was diagnosed with a viral URI and provided with Tussionex cough syrup and advised to take Zyrtec and use Flonase.  She called on 06/22 stating that symptoms have progressed. She has since used Zyrtec and Flonase as instructed without improvement. Her cough is better withTussionex. Her most bothersome symptom is ear pain and sore throat. Denies sinus pressure, fevers, doesn't feel acutely ill.   Review of Systems  Constitutional: Positive for fatigue. Negative for fever and chills.  HENT: Positive for ear pain and sore throat. Negative for congestion and sinus pressure.   Respiratory: Negative for cough and shortness of breath.   Cardiovascular: Negative for chest pain.       Past Medical History  Diagnosis Date  . Complication of anesthesia     woke during surgery (ablation) 2006?  Marland Kitchen Blood transfusion     1984   [place]  . Chronic kidney disease     occasional renal stone  . Hypercholesteremia     on meds  . Diabetes mellitus     type 2 Diab- A1C-7  . GERD (gastroesophageal reflux disease)     occasional Protonix-will take protonix night before surgery     Social History   Social History  . Marital Status: Married    Spouse Name: N/A  . Number of Children: N/A  . Years of Education: N/A   Occupational History  . Not on file.   Social History Main Topics  . Smoking status: Former Smoker -- 0.75 packs/day for 30 years    Types: Cigarettes  . Smokeless tobacco: Never Used     Comment: recently quit 03/01/2015  . Alcohol Use: No  . Drug Use: No  . Sexual Activity: Yes   Other Topics Concern  . Not on file    Social History Narrative    Past Surgical History  Procedure Laterality Date  . Uterine ablation    . Cervical disc surgery  2004  . Transvaginal tape  8/12  . Bladder suspension  12/08/2010    Procedure: TRANSVAGINAL TAPE (TVT) PROCEDURE;  Surgeon: Arloa Koh;  Location: [facility];  Service: Gynecology;  Laterality: N/A;  transvaginal tape with cystoscopy  . Bladder suspension  01/30/2011    Procedure: TRANSVAGINAL TAPE (TVT) PROCEDURE;  Surgeon: Arloa Koh;  Location: [facility];  Service: Gynecology;  Laterality: N/A;  excision of excess sling from suprapubic incision and cystoscopy  . Cystoscopy  01/30/2011    Procedure: CYSTOSCOPY;  Surgeon: Arloa Koh;  Location: [facility];  Service: Gynecology;  Laterality: N/A;  . Tonsillectomy  1976    Family History  Problem Relation Age of Onset  . Hypertension Mother   . Hyperlipidemia Mother   . Heart disease Mother   . Heart disease Father   . Hyperlipidemia Father   . Diabetes Father   . Heart attack Father   . Varicose Veins Father   . Peripheral vascular disease Father     amputation  . Cancer Paternal Aunt     lung/ brain    Allergies  Allergen Reactions  . Invokana [Canagliflozin]  Skin peelin  . Jardiance [Empagliflozin]     Myalgias    Current Outpatient Prescriptions on File Prior to Visit  Medication Sig Dispense Refill  . B-D UF III MINI PEN NEEDLES 31G X 5 MM MISC 2 (two) times daily. as directed  6  . chlorpheniramine-HYDROcodone (TUSSIONEX PENNKINETIC ER) 10-8 MG/5ML SUER Take 5 mLs by mouth at bedtime as needed. 140 mL 0  . fenofibrate (TRICOR) 145 MG tablet Take 145 mg by mouth daily.  6  . glipiZIDE (GLUCOTROL) 10 MG tablet Take 1 tablet (10 mg total) by mouth 2 (two) times daily before a meal. MUST SCHEDULE FOLLOW UP OFFICE VISIT 60 tablet 1  . glucose blood (FREESTYLE LITE) test strip Use as instructed to test blood sugar once daily E11.9 100 each 12  . HUMULIN 70/30 KWIKPEN (70-30) 100 UNIT/ML PEN  INJECT 40 UNITS INTO THE SKIN 2 (TWO) TIMES DAILY. (Patient taking differently: INJECT 30 UNITS INTO THE SKIN 2 (TWO) TIMES DAILY.) 24 mL 1  . JANUVIA 100 MG tablet Take 100 mg by mouth daily.  3  . metFORMIN (GLUCOPHAGE) 1000 MG tablet Take 1 tablet (1,000 mg total) by mouth 2 (two) times daily with a meal. MUST SCHEDULE OFFICE VISIT 180 tablet 0  . Multiple Vitamins-Minerals (MULTIVITAMINS THER. W/MINERALS) TABS Take 1 tablet by mouth daily.      . mupirocin ointment (BACTROBAN) 2 % Place 1 application into the nose 2 (two) times daily. 22 g 0  . Omega-3 Fatty Acids (FISH OIL) 500 MG CAPS Take 500 mg by mouth daily.    Marland Kitchen OVER THE COUNTER MEDICATION Take 1 tablet by mouth 2 (two) times daily. PATIENT TAKES CINNAMON TABLETS 1000mg     . pantoprazole (PROTONIX) 40 MG tablet Take 1 tablet by mouth daily.  3  . Pitavastatin Calcium (LIVALO) 2 MG TABS Take 1 tablet (2 mg total) by mouth daily. MUST SCHEDULE FOLLOW UP OFFICE VISIT FOR FURTHER REFILLS 30 tablet 0   No current facility-administered medications on file prior to visit.    BP 118/68 mmHg  Pulse 90  Temp(Src) 97.9 F (36.6 C) (Oral)  Wt 197 lb 6.4 oz (89.54 kg)  SpO2 98%    Objective:   Physical Exam  Constitutional: She appears well-nourished.  HENT:  Right Ear: Tympanic membrane and ear canal normal.  Left Ear: Tympanic membrane and ear canal normal.  Nose: Right sinus exhibits no maxillary sinus tenderness and no frontal sinus tenderness. Left sinus exhibits no maxillary sinus tenderness and no frontal sinus tenderness.  Mouth/Throat: Oropharynx is clear and moist.  Right TM impacted with cerumen, left with mild cerumen. TM's post irrigation   Eyes: Conjunctivae are normal.  Neck: Neck supple.  Cardiovascular: Normal rate and regular rhythm.   Pulmonary/Chest: Effort normal and breath sounds normal. She has no wheezes. She has no rales.  Lymphadenopathy:    She has no cervical adenopathy.  Skin: Skin is warm and dry.           Assessment & Plan:  Sore Throat:  Located mostly to right side, also with bilateral ear fullness/discomfort. Respiratory exam unremarkable except for cerumen impaction to canals. TM's post irrigation WNL. Moderate cerumen removed from right canal with improvement. Rapid Strep: Negative Does not appear acutely ill. Will administer Depo Medrol for throat inflammation and discomfort. No antibiotics are warranted at this time. Suspect her symptoms will improve since removal of cerumen. Return precautions provided.

## 2015-11-14 ENCOUNTER — Ambulatory Visit: Payer: Commercial Managed Care - HMO | Admitting: Internal Medicine

## 2015-11-14 DIAGNOSIS — Z0289 Encounter for other administrative examinations: Secondary | ICD-10-CM

## 2015-12-11 ENCOUNTER — Other Ambulatory Visit: Payer: Self-pay | Admitting: Internal Medicine

## 2015-12-20 ENCOUNTER — Other Ambulatory Visit: Payer: Self-pay | Admitting: Internal Medicine

## 2015-12-23 ENCOUNTER — Other Ambulatory Visit: Payer: Self-pay | Admitting: Internal Medicine

## 2015-12-27 ENCOUNTER — Ambulatory Visit (INDEPENDENT_AMBULATORY_CARE_PROVIDER_SITE_OTHER): Payer: Commercial Managed Care - HMO | Admitting: Internal Medicine

## 2015-12-27 ENCOUNTER — Encounter: Payer: Self-pay | Admitting: Internal Medicine

## 2015-12-27 VITALS — BP 116/70 | HR 79 | Temp 98.1°F | Wt 196.5 lb

## 2015-12-27 DIAGNOSIS — E1165 Type 2 diabetes mellitus with hyperglycemia: Secondary | ICD-10-CM | POA: Diagnosis not present

## 2015-12-27 DIAGNOSIS — Z720 Tobacco use: Secondary | ICD-10-CM

## 2015-12-27 DIAGNOSIS — E785 Hyperlipidemia, unspecified: Secondary | ICD-10-CM | POA: Diagnosis not present

## 2015-12-27 DIAGNOSIS — Z794 Long term (current) use of insulin: Secondary | ICD-10-CM

## 2015-12-27 DIAGNOSIS — E7800 Pure hypercholesterolemia, unspecified: Secondary | ICD-10-CM | POA: Diagnosis not present

## 2015-12-27 DIAGNOSIS — K219 Gastro-esophageal reflux disease without esophagitis: Secondary | ICD-10-CM

## 2015-12-27 DIAGNOSIS — F17200 Nicotine dependence, unspecified, uncomplicated: Secondary | ICD-10-CM

## 2015-12-27 LAB — COMPREHENSIVE METABOLIC PANEL
ALBUMIN: 4.5 g/dL (ref 3.5–5.2)
ALK PHOS: 66 U/L (ref 39–117)
ALT: 24 U/L (ref 0–35)
AST: 17 U/L (ref 0–37)
BUN: 16 mg/dL (ref 6–23)
CHLORIDE: 103 meq/L (ref 96–112)
CO2: 27 mEq/L (ref 19–32)
Calcium: 9.2 mg/dL (ref 8.4–10.5)
Creatinine, Ser: 0.8 mg/dL (ref 0.40–1.20)
GFR: 78.33 mL/min (ref >60.00)
Glucose, Bld: 122 mg/dL — ABNORMAL HIGH (ref 70–99)
POTASSIUM: 3.5 meq/L (ref 3.5–5.1)
SODIUM: 139 meq/L (ref 135–145)
TOTAL PROTEIN: 7.6 g/dL (ref 6.0–8.3)
Total Bilirubin: 0.4 mg/dL (ref 0.2–1.2)

## 2015-12-27 LAB — LIPID PANEL
CHOL/HDL RATIO: 4
CHOLESTEROL: 196 mg/dL (ref 0–200)
HDL: 54.7 mg/dL (ref >39.00)
NonHDL: 141.14
TRIGLYCERIDES: 238 mg/dL — AB (ref 0.0–149.0)
VLDL: 47.6 mg/dL — AB (ref 0.0–40.0)

## 2015-12-27 LAB — LDL CHOLESTEROL, DIRECT: LDL DIRECT: 109 mg/dL

## 2015-12-27 LAB — HEMOGLOBIN A1C: Hgb A1c MFr Bld: 8.4 % — ABNORMAL HIGH (ref 4.6–6.5)

## 2015-12-27 MED ORDER — NICOTINE 7 MG/24HR TD PT24: 7.0000 mg | MEDICATED_PATCH | Freq: Every day | TRANSDERMAL | 1 refills | Status: DC

## 2015-12-27 NOTE — Assessment & Plan Note (Signed)
Discussed low carb, low fat diet and exercising to lose weight Handout given on carb counting A1C today Microalbumin was done 03/2015 Foot exam today Continue yearly eye exams Encouraged her to get a flu shot in the fall Discussed pneumovax, she declines today

## 2015-12-27 NOTE — Patient Instructions (Signed)

## 2015-12-27 NOTE — Assessment & Plan Note (Signed)
Encouraged weight loss Advised her to avoid foods that trigger her reflux Will continue Protonix for now

## 2015-12-27 NOTE — Progress Notes (Signed)
HPI:  Pt presents to the clinic for 6 month follow up of chronic conditions.  HLD: Lipid profile 03/2015 reviewed. Total 186, HDL 55, LDL 96 Triglycerides of 172. She is on Tricor, Livalo, Fish Oil and taking as prescribed. She is trying to consume a low fat diet.  DM2: A1C 06/2015 was 9.0%. Her fasting sugars are 80-190. She is on Januvia, Metformin, Glipizide and Humulin 40 units BID. She does feel like her bones ache in the morning and has read that this could be a side effect of the Humulin. Flu vaccine 01/2015. Her pneumonia vaccine was 2011. Her last eye exam was 07/2015. Foot exam 03/2015.   GERD: Symptoms well controlled on Protonix. She denies breakthrough symptoms.  She is also ready to quit smoking. She has smoked for over 30 years. She has quit 4 times during that time. She would like a RX for the Nicotine Patch.   Review of Systems:  Past Medical History:  Diagnosis Date  . Blood transfusion    1984   [place]  . Chronic kidney disease    occasional renal stone  . Complication of anesthesia    woke during surgery (ablation) 2006?  . Diabetes mellitus    type 2 Diab- A1C-7  . GERD (gastroesophageal reflux disease)    occasional Protonix-will take protonix night before surgery  . Hypercholesteremia    on meds    Current Outpatient Prescriptions  Medication Sig Dispense Refill  . B-D UF III MINI PEN NEEDLES 31G X 5 MM MISC 2 (two) times daily. as directed  6  . fenofibrate (TRICOR) 145 MG tablet Take 145 mg by mouth daily.  6  . glipiZIDE (GLUCOTROL) 10 MG tablet Take 1 tablet (10 mg total) by mouth 2 (two) times daily before a meal. PLEASE SCHEDULE ANNUAL EXAM FOR November 60 tablet 3  . glucose blood (FREESTYLE LITE) test strip Use as instructed to test blood sugar once daily E11.9 100 each 12  . HUMULIN 70/30 KWIKPEN (70-30) 100 UNIT/ML PEN INJECT 40 UNITS INTO THE SKIN 2 (TWO) TIMES DAILY. (Patient taking differently: INJECT 30 UNITS INTO THE SKIN 2 (TWO) TIMES DAILY.) 24  mL 1  . JANUVIA 100 MG tablet Take 100 mg by mouth daily.  3  . metFORMIN (GLUCOPHAGE) 1000 MG tablet Take 1 tablet (1,000 mg total) by mouth 2 (two) times daily with a meal. MUST SCHEDULE OFFICE VISIT 180 tablet 0  . Multiple Vitamins-Minerals (MULTIVITAMINS THER. W/MINERALS) TABS Take 1 tablet by mouth daily.      . mupirocin ointment (BACTROBAN) 2 % Place 1 application into the nose 2 (two) times daily. 22 g 0  . Omega-3 Fatty Acids (FISH OIL) 500 MG CAPS Take 500 mg by mouth daily.    Marland Kitchen OVER THE COUNTER MEDICATION Take 1 tablet by mouth 2 (two) times daily. PATIENT TAKES CINNAMON TABLETS 1000mg     . pantoprazole (PROTONIX) 40 MG tablet Take 1 tablet by mouth daily.  3  . Pitavastatin Calcium (LIVALO) 2 MG TABS Take 1 tablet (2 mg total) by mouth daily. 30 tablet 0  . mometasone (ELOCON) 0.1 % cream APPLY 1 APPLICATION APPLY ON THE SKIN TWICE A DAY  3   No current facility-administered medications for this visit.     Allergies  Allergen Reactions  . Invokana [Canagliflozin]     Skin peelin  . Jardiance [Empagliflozin]     Myalgias    Family History  Problem Relation Age of Onset  . Hypertension Mother   .  Hyperlipidemia Mother   . Heart disease Mother   . Heart disease Father   . Hyperlipidemia Father   . Diabetes Father   . Heart attack Father   . Varicose Veins Father   . Peripheral vascular disease Father     amputation  . Cancer Paternal Aunt     lung/ brain    Social History   Social History  . Marital status: Married    Spouse name: N/A  . Number of children: N/A  . Years of education: N/A   Occupational History  . Not on file.   Social History Main Topics  . Smoking status: Former Smoker    Packs/day: 0.75    Years: 30.00    Types: Cigarettes  . Smokeless tobacco: Never Used     Comment: recently quit 03/01/2015  . Alcohol use No  . Drug use: No  . Sexual activity: Yes   Other Topics Concern  . Not on file   Social History Narrative  . No  narrative on file     Constitutional: Denies fever, malaise, fatigue, headache or abrupt weight changes.  HEENT: Denies eye pain, eye redness, ear pain, ringing in the ears, wax buildup, runny nose, nasal congestion, bloody nose, or sore throat. Respiratory: Denies difficulty breathing, shortness of breath, cough or sputum production.   Cardiovascular: Denies chest pain, chest tightness, palpitations or swelling in the hands or feet.  Gastrointestinal: Denies abdominal pain, bloating, constipation, diarrhea or blood in the stool.  GU: Denies urgency, frequency, pain with urination, burning sensation, blood in urine, odor or discharge. Musculoskeletal: Pt reports achy bones. Denies decrease in range of motion, difficulty with gait, muscle pain or joint pain and swelling.  Skin: Denies redness, rashes, lesions or ulcercations.  Neurological: Denies dizziness, difficulty with memory, difficulty with speech or problems with balance and coordination.  Psych: Denies anxiety, depression, SI/HI.   No other specific complaints in a complete review of systems (except as listed in HPI above).  Objective:  BP 116/70   Pulse 79   Temp 98.1 F (36.7 C) (Oral)   Wt 196 lb 8 oz (89.1 kg)   SpO2 97%   BMI 34.81 kg/m    General: Appears her stated age, obese in NAD. Skin: Warm, dry and intact. No rashes, lesions or ulcerations noted. Cardiovascular: Normal rate and rhythm. S1,S2 noted.  No murmur, rubs or gallops noted. No JVD or BLE edema. No carotid bruits noted. Pulmonary/Chest: Normal effort and positive vesicular breath sounds. No respiratory distress. No wheezes, rales or ronchi noted.  Abdomen: Soft and nontender. Normal bowel sounds. No distention or masses noted.  Neurological: Alert and oriented. Sensation intact to BLE. Psychiatric: Mood and affect normal. Behavior is normal. Judgment and thought content normal.   Assessment and Plan:  Smoker, motivated to quit:  eRx for Nicotine  patches sent to pharmacy  RTC in 6 months for your annual exam

## 2015-12-27 NOTE — Assessment & Plan Note (Signed)
Will check CMET and Lipid profile today Encouraged her to consume a low fat diet Continue Tricor, Livalo and Fish Oil

## 2015-12-28 ENCOUNTER — Other Ambulatory Visit: Payer: Self-pay | Admitting: Obstetrics & Gynecology

## 2015-12-28 ENCOUNTER — Encounter: Payer: Self-pay | Admitting: Internal Medicine

## 2015-12-28 DIAGNOSIS — F520 Hypoactive sexual desire disorder: Secondary | ICD-10-CM | POA: Insufficient documentation

## 2015-12-28 LAB — HM PAP SMEAR: HM Pap smear: NORMAL

## 2015-12-29 LAB — CYTOLOGY - PAP

## 2015-12-29 NOTE — Addendum Note (Signed)
Addended by: Lurlean Nanny on: 12/29/2015 04:48 PM   Modules accepted: Orders

## 2015-12-30 ENCOUNTER — Other Ambulatory Visit: Payer: Self-pay

## 2015-12-30 MED ORDER — FENOFIBRATE 145 MG PO TABS: 145.0000 mg | ORAL_TABLET | Freq: Every day | ORAL | 5 refills | Status: DC

## 2015-12-30 MED ORDER — METFORMIN HCL 1000 MG PO TABS: 1000.0000 mg | ORAL_TABLET | Freq: Two times a day (BID) | ORAL | 1 refills | Status: DC

## 2015-12-30 MED ORDER — PITAVASTATIN CALCIUM 2 MG PO TABS: 1.0000 | ORAL_TABLET | Freq: Every day | ORAL | 5 refills | Status: DC

## 2015-12-30 MED ORDER — GLIPIZIDE 10 MG PO TABS: 10.0000 mg | ORAL_TABLET | Freq: Two times a day (BID) | ORAL | 5 refills | Status: DC

## 2015-12-30 MED ORDER — INSULIN GLARGINE 100 UNIT/ML SOLOSTAR PEN: 10.0000 [IU] | PEN_INJECTOR | Freq: Every day | SUBCUTANEOUS | 0 refills | Status: DC

## 2015-12-30 NOTE — Addendum Note (Signed)
Addended by: Lurlean Nanny on: 12/30/2015 11:02 AM   Modules accepted: Orders

## 2016-01-02 NOTE — Addendum Note (Signed)
Addended by: Lurlean Nanny on: 01/02/2016 11:47 AM   Modules accepted: Orders

## 2016-02-19 ENCOUNTER — Other Ambulatory Visit: Payer: Self-pay | Admitting: Internal Medicine

## 2016-03-06 ENCOUNTER — Encounter: Payer: Self-pay | Admitting: Internal Medicine

## 2016-03-06 ENCOUNTER — Ambulatory Visit (INDEPENDENT_AMBULATORY_CARE_PROVIDER_SITE_OTHER): Payer: Commercial Managed Care - HMO | Admitting: Internal Medicine

## 2016-03-06 VITALS — BP 122/80 | HR 85 | Temp 97.6°F | Wt 191.5 lb

## 2016-03-06 DIAGNOSIS — H6983 Other specified disorders of Eustachian tube, bilateral: Secondary | ICD-10-CM

## 2016-03-06 DIAGNOSIS — H8111 Benign paroxysmal vertigo, right ear: Secondary | ICD-10-CM

## 2016-03-06 MED ORDER — MECLIZINE HCL 25 MG PO TABS: 25.0000 mg | ORAL_TABLET | Freq: Three times a day (TID) | ORAL | 0 refills | Status: DC | PRN

## 2016-03-06 NOTE — Progress Notes (Signed)
Subjective:    Patient ID: [name], female    DOB: March 20, 1956, 60 y.o.   MRN: [id number]  HPI  Pt presents to the clinic today with c/o right ear pain. This started yesterday. She describes the pain as pressure. She has noticed some decreased hearing in the ear, dizziness and nausea. She describes the dizziness as a sensation that the room is spinning, worse with head movement. She denies runny nose, sore throat or cough. She has tried Dramamine with some relief. She has no history of vertigo. She has not had sick contacts that she is aware of.   Review of Systems      Past Medical History:  Diagnosis Date  . Blood transfusion    1984   [place]  . Chronic kidney disease    occasional renal stone  . Complication of anesthesia    woke during surgery (ablation) 2006?  . Diabetes mellitus    type 2 Diab- A1C-7  . GERD (gastroesophageal reflux disease)    occasional Protonix-will take protonix night before surgery  . Hypercholesteremia    on meds    Current Outpatient Prescriptions  Medication Sig Dispense Refill  . B-D UF III MINI PEN NEEDLES 31G X 5 MM MISC 2 (two) times daily. as directed  6  . [facility] NICOTINE 7 MG/24HR patch PLACE 1 PATCH (7 MG TOTAL) ONTO THE SKIN DAILY. 28 patch 1  . fenofibrate (TRICOR) 145 MG tablet Take 1 tablet (145 mg total) by mouth daily. 30 tablet 5  . glipiZIDE (GLUCOTROL) 10 MG tablet Take 1 tablet (10 mg total) by mouth 2 (two) times daily before a meal. 60 tablet 5  . glucose blood (FREESTYLE LITE) test strip Use as instructed to test blood sugar once daily E11.9 100 each 12  . insulin glargine (LANTUS) 100 unit/mL SOPN Inject 20 Units into the skin at bedtime.    . metFORMIN (GLUCOPHAGE) 1000 MG tablet Take 1 tablet (1,000 mg total) by mouth 2 (two) times daily with a meal. 180 tablet 1  . mometasone (ELOCON) 0.1 % cream APPLY 1 APPLICATION APPLY ON THE SKIN TWICE A DAY  3  . Multiple Vitamins-Minerals (MULTIVITAMINS THER. W/MINERALS) TABS Take 1  tablet by mouth daily.      . mupirocin ointment (BACTROBAN) 2 % Place 1 application into the nose 2 (two) times daily. 22 g 0  . Omega-3 Fatty Acids (FISH OIL) 500 MG CAPS Take 500 mg by mouth daily.    Marland Kitchen OVER THE COUNTER MEDICATION Take 1 tablet by mouth 2 (two) times daily. PATIENT TAKES CINNAMON TABLETS 1000mg     . pantoprazole (PROTONIX) 40 MG tablet Take 1 tablet by mouth daily.  3  . Pitavastatin Calcium (LIVALO) 2 MG TABS Take 1 tablet (2 mg total) by mouth daily. 30 tablet 5   No current facility-administered medications for this visit.     Allergies  Allergen Reactions  . Invokana [Canagliflozin]     Skin peelin  . Jardiance [Empagliflozin]     Myalgias    Family History  Problem Relation Age of Onset  . Hypertension Mother   . Hyperlipidemia Mother   . Heart disease Mother   . Heart disease Father   . Hyperlipidemia Father   . Diabetes Father   . Heart attack Father   . Varicose Veins Father   . Peripheral vascular disease Father     amputation  . Cancer Paternal Aunt     lung/ brain  Social History   Social History  . Marital status: Married    Spouse name: N/A  . Number of children: N/A  . Years of education: N/A   Occupational History  . Not on file.   Social History Main Topics  . Smoking status: Former Smoker    Packs/day: 0.75    Years: 30.00    Types: Cigarettes  . Smokeless tobacco: Never Used     Comment: recently quit 03/01/2015  . Alcohol use No  . Drug use: No  . Sexual activity: Yes   Other Topics Concern  . Not on file   Social History Narrative  . No narrative on file     Constitutional: Denies fever, malaise, fatigue, headache or abrupt weight changes.  HEENT: Pt reports ear pain, decreased hearing. Denies eye pain, eye redness, ringing in the ears, wax buildup, runny nose, nasal congestion, bloody nose, or sore throat. Respiratory: Denies difficulty breathing, shortness of breath, cough or sputum production.     Cardiovascular: Denies chest pain, chest tightness, palpitations or swelling in the hands or feet.  Gastrointestinal: Pt reports nausea. Denies abdominal pain, bloating, constipation, diarrhea or blood in the stool.  Neurological: Pt reports dizziness. Denies difficulty with memory, difficulty with speech or problems with balance and coordination.    No other specific complaints in a complete review of systems (except as listed in HPI above).  Objective:   Physical Exam   BP 122/80   Pulse 85   Temp 97.6 F (36.4 C) (Oral)   Wt 191 lb 8 oz (86.9 kg)   SpO2 97%   BMI 33.92 kg/m  Wt Readings from Last 3 Encounters:  03/06/16 191 lb 8 oz (86.9 kg)  12/27/15 196 lb 8 oz (89.1 kg)  10/28/15 197 lb 6.4 oz (89.5 kg)    General: Appears her stated age, in NAD. HEENT: Head: normal shape and size, no sinus tenderness noted; Eyes: PERRLA and EOMs intact; Ears: Tm's gray and intact, but retracted, normal light reflex, + serous effusion bilaterally; Cardiovascular: Normal rate and rhythm. S1,S2 noted.  No murmur, rubs or gallops noted. Pulmonary/Chest: Normal effort and positive vesicular breath sounds. No respiratory distress. No wheezes, rales or ronchi noted.  Neurological: Alert and oriented. Coordination normal.    BMET    Component Value Date/Time   NA 139 12/27/2015 0910   K 3.5 12/27/2015 0910   CL 103 12/27/2015 0910   CO2 27 12/27/2015 0910   GLUCOSE 122 (H) 12/27/2015 0910   BUN 16 12/27/2015 0910   CREATININE 0.80 12/27/2015 0910   CREATININE 0.65 01/07/2014 1402   CALCIUM 9.2 12/27/2015 0910   GFRNONAA >89 01/07/2014 1402   GFRAA >89 01/07/2014 1402    Lipid Panel     Component Value Date/Time   CHOL 196 12/27/2015 0910   TRIG 238.0 (H) 12/27/2015 0910   HDL 54.70 12/27/2015 0910   CHOLHDL 4 12/27/2015 0910   VLDL 47.6 (H) 12/27/2015 0910   LDLCALC 96 03/24/2015 1524    CBC    Component Value Date/Time   WBC 10.9 (H) 03/24/2015 1524   RBC 4.70  03/24/2015 1524   HGB 13.6 03/24/2015 1524   HCT 40.7 03/24/2015 1524   PLT 278.0 03/24/2015 1524   MCV 86.7 03/24/2015 1524   MCH 29.2 01/07/2014 1402   MCHC 33.4 03/24/2015 1524   RDW 13.1 03/24/2015 1524   LYMPHSABS 3.1 01/07/2014 1402   MONOABS 0.6 01/07/2014 1402   EOSABS 0.1 01/07/2014 1402  BASOSABS 0.0 01/07/2014 1402    Hgb A1C Lab Results  Component Value Date   HGBA1C 8.4 (H) 12/27/2015        Assessment & Plan:   ETD bilateral:  Start Flonase 1 spray each nostril 2 x day for the next 3 days  BPPV, right:  Handout given for the [facility] eRx for Meclizine 25 mg TID prn Make sure are drinking plenty of fluids  Return precautions discussed Webb Silversmith, NP

## 2016-03-06 NOTE — Patient Instructions (Signed)

## 2016-03-07 ENCOUNTER — Ambulatory Visit: Payer: Commercial Managed Care - HMO | Admitting: Internal Medicine

## 2016-03-07 ENCOUNTER — Telehealth: Payer: Self-pay

## 2016-03-07 NOTE — Telephone Encounter (Signed)
No, she needs to get the [facility] and take that.

## 2016-03-07 NOTE — Telephone Encounter (Signed)
Pt left v/m; pt seen 03/06/16; pt does not have flonase and I advised pt can get OTC; pt voiced understanding; pt also wants to know if should be taking abx for fluid in ear. No fever but pt cannot hear out of rt ear. [facility] [place]. Pt request cb.

## 2016-03-07 NOTE — Telephone Encounter (Signed)
Pt notified as instructed and pt voiced understanding. 

## 2016-03-08 ENCOUNTER — Telehealth: Payer: Self-pay | Admitting: Internal Medicine

## 2016-03-08 MED ORDER — PREDNISONE 10 MG PO TABS: ORAL_TABLET | ORAL | 0 refills | Status: DC

## 2016-03-08 NOTE — Telephone Encounter (Signed)
Pt called stating she is no better she is  feeling nausea and dizzy.  She called ENT they had no opening till end of nov or march of next year She would like to know what she needs to do  Please advise  She cannot hear anything out of right ear

## 2016-03-08 NOTE — Addendum Note (Signed)
Addended by: Lurlean Nanny on: 03/08/2016 03:11 PM   Modules accepted: Orders

## 2016-03-08 NOTE — Telephone Encounter (Signed)
I don't understand why she won't try the Flonase. The only other options is to put her on Prednisone for 6 days

## 2016-03-08 NOTE — Telephone Encounter (Signed)
Pt reports that she has been doing all that was recommended, using Flonase and Zyrtec with no relief---per verbal order--[name] authorized to send Prednisone 10mg  x 6 days, 30mg  day 1-2, 20mg  day 3-4 and 10mg  day 5-6---Rx sent to pharmacy and pt is aware and expressed understanding and previous knowledge about taking prednisone as a diabetic

## 2016-03-12 ENCOUNTER — Telehealth: Payer: Self-pay

## 2016-03-12 DIAGNOSIS — H8110 Benign paroxysmal vertigo, unspecified ear: Secondary | ICD-10-CM | POA: Insufficient documentation

## 2016-03-12 NOTE — Telephone Encounter (Signed)
Pt.notified

## 2016-03-12 NOTE — Telephone Encounter (Signed)
Referral placed.

## 2016-03-12 NOTE — Telephone Encounter (Signed)
Pt is calling to let us know that she still feels dizzy and "sick to her stomach" she was here on 03/06/16 and is not better. Pt would like a referral to [facility] ENT. Thanks.

## 2016-03-15 ENCOUNTER — Ambulatory Visit: Payer: Commercial Managed Care - HMO | Admitting: Internal Medicine

## 2016-03-15 ENCOUNTER — Ambulatory Visit (INDEPENDENT_AMBULATORY_CARE_PROVIDER_SITE_OTHER): Payer: Commercial Managed Care - HMO | Admitting: Internal Medicine

## 2016-03-15 ENCOUNTER — Encounter: Payer: Self-pay | Admitting: Internal Medicine

## 2016-03-15 VITALS — BP 122/82 | HR 93 | Temp 97.7°F | Wt 192.5 lb

## 2016-03-15 DIAGNOSIS — E1165 Type 2 diabetes mellitus with hyperglycemia: Secondary | ICD-10-CM | POA: Diagnosis not present

## 2016-03-15 DIAGNOSIS — H6983 Other specified disorders of Eustachian tube, bilateral: Secondary | ICD-10-CM

## 2016-03-15 DIAGNOSIS — H8110 Benign paroxysmal vertigo, unspecified ear: Secondary | ICD-10-CM

## 2016-03-15 DIAGNOSIS — Z794 Long term (current) use of insulin: Secondary | ICD-10-CM | POA: Diagnosis not present

## 2016-03-15 MED ORDER — MECLIZINE HCL 25 MG PO TABS: 25.0000 mg | ORAL_TABLET | Freq: Three times a day (TID) | ORAL | 0 refills | Status: DC | PRN

## 2016-03-15 MED ORDER — INSULIN GLARGINE 100 UNIT/ML SOLOSTAR PEN: 40.0000 [IU] | PEN_INJECTOR | Freq: Every day | SUBCUTANEOUS | 5 refills | Status: DC

## 2016-03-15 NOTE — Progress Notes (Signed)
Subjective:    Patient ID: [name], female    DOB: 1955-09-27, 60 y.o.   MRN: [id number]  HPI  Pt presents to the clinic today to follow up ETD bilateral, and BPPV. She was seen 10/31 for the same, advised to try Flonase OTC. After a few days of that not working, she called back. A Prednisone taper was sent to her pharmacy. She reports she has not gotten any relief from that. The dizziness only occurs with head movements, not position changes. The nausea is intermittent, she reports she vomited multiple times yesterday. She has been taking Meclizine which has helped some. She already has an appt with ENT set up next Wednesday. She is concerned though that she may have had a stroke and dizziness is her only symptoms. She is requesting blood work for evaluation of a stroke. She denies facial droop, visual changes, difficulty with speech or swallowing, or unilateral weakness.  She also wants to follow up on her diabetes. Her last A1C was 8.4%, 12/2015. She is currently taking Lantus 40 units at bedtime. Her average fasting glucose is about 200. She is also taking Glipizide and metformin as prescribed. She denies blurred vision, increased thirst, urinary frequency, non healing wounds or numbness and tingling in her hands or feet.  Review of Systems      Past Medical History:  Diagnosis Date  . Blood transfusion    1984   [place]  . Chronic kidney disease    occasional renal stone  . Complication of anesthesia    woke during surgery (ablation) 2006?  . Diabetes mellitus    type 2 Diab- A1C-7  . GERD (gastroesophageal reflux disease)    occasional Protonix-will take protonix night before surgery  . Hypercholesteremia    on meds    Current Outpatient Prescriptions  Medication Sig Dispense Refill  . B-D UF III MINI PEN NEEDLES 31G X 5 MM MISC 2 (two) times daily. as directed  6  . [facility] NICOTINE 7 MG/24HR patch PLACE 1 PATCH (7 MG TOTAL) ONTO THE SKIN DAILY. 28 patch 1  . fenofibrate  (TRICOR) 145 MG tablet Take 1 tablet (145 mg total) by mouth daily. 30 tablet 5  . glipiZIDE (GLUCOTROL) 10 MG tablet Take 1 tablet (10 mg total) by mouth 2 (two) times daily before a meal. 60 tablet 5  . glucose blood (FREESTYLE LITE) test strip Use as instructed to test blood sugar once daily E11.9 100 each 12  . meclizine (ANTIVERT) 25 MG tablet Take 1 tablet (25 mg total) by mouth 3 (three) times daily as needed for dizziness. 30 tablet 0  . metFORMIN (GLUCOPHAGE) 1000 MG tablet Take 1 tablet (1,000 mg total) by mouth 2 (two) times daily with a meal. 180 tablet 1  . mometasone (ELOCON) 0.1 % cream APPLY 1 APPLICATION APPLY ON THE SKIN TWICE A DAY  3  . Multiple Vitamins-Minerals (MULTIVITAMINS THER. W/MINERALS) TABS Take 1 tablet by mouth daily.      . mupirocin ointment (BACTROBAN) 2 % Place 1 application into the nose 2 (two) times daily. 22 g 0  . Omega-3 Fatty Acids (FISH OIL) 500 MG CAPS Take 500 mg by mouth daily.    Marland Kitchen OVER THE COUNTER MEDICATION Take 1 tablet by mouth 2 (two) times daily. PATIENT TAKES CINNAMON TABLETS 1000mg     . pantoprazole (PROTONIX) 40 MG tablet Take 1 tablet by mouth daily.  3  . Pitavastatin Calcium (LIVALO) 2 MG TABS Take 1 tablet (2  mg total) by mouth daily. 30 tablet 5  . Insulin Glargine (LANTUS) 100 UNIT/ML Solostar Pen Inject 40 Units into the skin daily at 10 pm. 15 mL 5   No current facility-administered medications for this visit.     Allergies  Allergen Reactions  . Invokana [Canagliflozin]     Skin peelin  . Jardiance [Empagliflozin]     Myalgias    Family History  Problem Relation Age of Onset  . Hypertension Mother   . Hyperlipidemia Mother   . Heart disease Mother   . Heart disease Father   . Hyperlipidemia Father   . Diabetes Father   . Heart attack Father   . Varicose Veins Father   . Peripheral vascular disease Father     amputation  . Cancer Paternal Aunt     lung/ brain    Social History   Social History  . Marital  status: Married    Spouse name: N/A  . Number of children: N/A  . Years of education: N/A   Occupational History  . Not on file.   Social History Main Topics  . Smoking status: Former Smoker    Packs/day: 0.75    Years: 30.00    Types: Cigarettes  . Smokeless tobacco: Never Used     Comment: recently quit 03/01/2015  . Alcohol use No  . Drug use: No  . Sexual activity: Yes   Other Topics Concern  . Not on file   Social History Narrative  . No narrative on file     Constitutional: Denies fever, malaise, fatigue, headache or abrupt weight changes.  HEENT: Pt reports decreased hearing. Denies eye pain, eye redness, ear pain, ringing in the ears, wax buildup, runny nose, nasal congestion, bloody nose, or sore throat. Respiratory: Denies difficulty breathing, shortness of breath, cough or sputum production.   Cardiovascular: Denies chest pain, chest tightness, palpitations or swelling in the hands or feet.  Gastrointestinal: Pt reports nausea. Denies abdominal pain, bloating, constipation, diarrhea or blood in the stool.  GU: Denies urgency, frequency, pain with urination, burning sensation, blood in urine, odor or discharge. Musculoskeletal: Denies decrease in range of motion, difficulty with gait, muscle pain or joint pain and swelling.  Skin: Denies redness, rashes, lesions or ulcercations.  Neurological: Pt reports dizziness. Denies difficulty with memory, difficulty with speech or problems with balance and coordination.  Psych: Denies anxiety, depression, SI/HI.  No other specific complaints in a complete review of systems (except as listed in HPI above).  Objective:   Physical Exam  BP 122/82   Pulse 93   Temp 97.7 F (36.5 C) (Oral)   Wt 192 lb 8 oz (87.3 kg)   SpO2 97%   BMI 34.10 kg/m  Wt Readings from Last 3 Encounters:  03/15/16 192 lb 8 oz (87.3 kg)  03/06/16 191 lb 8 oz (86.9 kg)  12/27/15 196 lb 8 oz (89.1 kg)    General: Appears her stated age, well  developed, well nourished in NAD. Skin: Warm, dry and intact.  HEENT: Head: normal shape and size; Eyes: sclera white, no icterus, conjunctiva pink, PERRLA and EOMs intact; Ears: Tm's gray and intact, normal light reflex, + serous effusion bilaterally; Cardiovascular: Normal rate and rhythm. Pulmonary/Chest: Normal effort and positive vesicular breath sounds.  Abdomen: Soft and nontender.  Neurological: Alert and oriented. Coordination normal.  Psychiatric: Mood and affect normal. Behavior is normal. Judgment and thought content normal.   EKG:  BMET    Component Value Date/Time  NA 139 12/27/2015 0910   K 3.5 12/27/2015 0910   CL 103 12/27/2015 0910   CO2 27 12/27/2015 0910   GLUCOSE 122 (H) 12/27/2015 0910   BUN 16 12/27/2015 0910   CREATININE 0.80 12/27/2015 0910   CREATININE 0.65 01/07/2014 1402   CALCIUM 9.2 12/27/2015 0910   GFRNONAA >89 01/07/2014 1402   GFRAA >89 01/07/2014 1402    Lipid Panel     Component Value Date/Time   CHOL 196 12/27/2015 0910   TRIG 238.0 (H) 12/27/2015 0910   HDL 54.70 12/27/2015 0910   CHOLHDL 4 12/27/2015 0910   VLDL 47.6 (H) 12/27/2015 0910   LDLCALC 96 03/24/2015 1524    CBC    Component Value Date/Time   WBC 10.9 (H) 03/24/2015 1524   RBC 4.70 03/24/2015 1524   HGB 13.6 03/24/2015 1524   HCT 40.7 03/24/2015 1524   PLT 278.0 03/24/2015 1524   MCV 86.7 03/24/2015 1524   MCH 29.2 01/07/2014 1402   MCHC 33.4 03/24/2015 1524   RDW 13.1 03/24/2015 1524   LYMPHSABS 3.1 01/07/2014 1402   MONOABS 0.6 01/07/2014 1402   EOSABS 0.1 01/07/2014 1402   BASOSABS 0.0 01/07/2014 1402    Hgb A1C Lab Results  Component Value Date   HGBA1C 8.4 (H) 12/27/2015      Assessment & Plan:   ETD bilateral, BPPV:  Failed conservative treatment She is concerned that she may have had a stroke, but refusing CT head today, wants to wait until ENT appt next week Meclizine refilled today  DM 2:  Uncontrolled Advised her it is too soon to  recheck A1C, need to wait to later this month She will increase Lantus by 2 units every 3 days until fasting sugars are < 120 She will return in 3 weeks for lab only appt  Return precautions discussed Webb Silversmith, NP

## 2016-03-15 NOTE — Patient Instructions (Signed)
Insulin Glargine injection What is this medicine? INSULIN GLARGINE (IN su lin GLAR geen) is a human-made form of insulin. This drug lowers the amount of sugar in your blood. It is a long-acting insulin that is usually given once a day. This medicine may be used for other purposes; ask your health care provider or pharmacist if you have questions. What should I tell my health care provider before I take this medicine? They need to know if you have any of these conditions: -episodes of hypoglycemia -kidney disease -liver disease -an unusual or allergic reaction to insulin, metacresol, other medicines, foods, dyes, or preservatives -pregnant or trying to get pregnant -breast-feeding How should I use this medicine? This medicine is for injection under the skin. Use this medicine at the same time each day. Use exactly as directed. This insulin should never be mixed in the same syringe with other insulins before injection. Do not vigorously shake before use. You will be taught how to use this medicine and how to adjust doses for activities and illness. Do not use more insulin than prescribed. Always check the appearance of your insulin before using it. This medicine should be clear and colorless like water. Do not use it if it is cloudy, thickened, colored, or has solid particles in it. It is important that you put your used needles and syringes in a special sharps container. Do not put them in a trash can. If you do not have a sharps container, call your pharmacist or healthcare provider to get one. Talk to your pediatrician regarding the use of this medicine in children. Special care may be needed. Overdosage: If you think you have taken too much of this medicine contact a poison control center or emergency room at once. NOTE: This medicine is only for you. Do not share this medicine with others. What if I miss a dose? It is important not to miss a dose. Your health care professional or doctor should  discuss a plan for missed doses with you. If you do miss a dose, follow their plan. Do not take double doses. What may interact with this medicine? -other medicines for diabetes Many medications may cause an increase or decrease in blood sugar, these include: -alcohol containing beverages -aspirin and aspirin-like drugs -chloramphenicol -chromium -diuretics -female hormones, like estrogens or progestins and birth control pills -heart medicines -isoniazid -female hormones or anabolic steroids -medicines for weight loss -medicines for allergies, asthma, cold, or cough -medicines for mental problems -medicines called MAO Inhibitors like Nardil, Parnate, Marplan, Eldepryl -niacin -NSAIDs, medicines for pain and inflammation, like ibuprofen or naproxen -pentamidine -phenytoin -probenecid -quinolone antibiotics like ciprofloxacin, levofloxacin, ofloxacin -some herbal dietary supplements -steroid medicines like prednisone or cortisone -thyroid medicine Some medications can hide the warning symptoms of low blood sugar. You may need to monitor your blood sugar more closely if you are taking one of these medications. These include: -beta-blockers such as atenolol, metoprolol, propranolol -clonidine -guanethidine -reserpine This list may not describe all possible interactions. Give your health care provider a list of all the medicines, herbs, non-prescription drugs, or dietary supplements you use. Also tell them if you smoke, drink alcohol, or use illegal drugs. Some items may interact with your medicine. What should I watch for while using this medicine? Visit your health care professional or doctor for regular checks on your progress. Do not drive, use machinery, or do anything that needs mental alertness until you know how this medicine affects you. Alcohol may interfere with the effect   of this medicine. Avoid alcoholic drinks. A test called the HbA1C (A1C) will be monitored. This is a  simple blood test. It measures your blood sugar control over the last 2 to 3 months. You will receive this test every 3 to 6 months. Learn how to check your blood sugar. Learn the symptoms of low and high blood sugar and how to manage them. Always carry a quick-source of sugar with you in case you have symptoms of low blood sugar. Examples include hard sugar candy or glucose tablets. Make sure others know that you can choke if you eat or drink when you develop serious symptoms of low blood sugar, such as seizures or unconsciousness. They must get medical help at once. Tell your doctor or health care professional if you have high blood sugar. You might need to change the dose of your medicine. If you are sick or exercising more than usual, you might need to change the dose of your medicine. Do not skip meals. Ask your doctor or health care professional if you should avoid alcohol. Many nonprescription cough and cold products contain sugar or alcohol. These can affect blood sugar. Make sure that you have the right kind of syringe for the type of insulin you use. Try not to change the brand and type of insulin or syringe unless your health care professional or doctor tells you to. Switching insulin brand or type can cause dangerously high or low blood sugar. Always keep an extra supply of insulin, syringes, and needles on hand. Use a syringe one time only. Throw away syringe and needle in a closed container to prevent accidental needle sticks. Insulin pens and cartridges should never be shared. Even if the needle is changed, sharing may result in passing of viruses like hepatitis or HIV. Wear a medical ID bracelet or chain, and carry a card that describes your disease and details of your medicine and dosage times. What side effects may I notice from receiving this medicine? Side effects that you should report to your health care professional or doctor as soon as possible: -allergic reactions like skin rash,  itching or hives, swelling of the face, lips, or tongue -breathing problems -signs and symptoms of high blood sugar such as dizziness, dry mouth, dry skin, fruity breath, nausea, stomach pain, increased hunger or thirst, increased urination -signs and symptoms of low blood sugar such as feeling anxious, confusion, dizziness, increased hunger, unusually weak or tired, sweating, shakiness, cold, irritable, headache, blurred vision, fast heartbeat, loss of consciousness Side effects that usually do not require medical attention (report to your health care professional or doctor if they continue or are bothersome): -increase or decrease in fatty tissue under the skin due to overuse of a particular injection site -itching, burning, swelling, or rash at site where injected This list may not describe all possible side effects. Call your doctor for medical advice about side effects. You may report side effects to FDA at 1-800-FDA-1088. Where should I keep my medicine? Keep out of the reach of children. Store unopened vials in a refrigerator between 2 and 8 degrees C (36 and 46 degrees F). Do not freeze or use if the insulin has been frozen. Opened vials (vials currently in use) may be stored in the refrigerator or at room temperature, at approximately 25 degrees C (77 degrees F) or cooler. Keeping your insulin at room temperature decreases the amount of pain during injection. Once opened, your insulin can be used for 28 days. After 28 days,   the vial should be thrown away. Store Lantus Solostar Pens or Basaglar KwikPens in a refrigerator between 2 and 8 degrees C (36 and 46 degrees F) or at room temperature below 30 degrees C (86 degrees F). Do not freeze or use if the insulin has been frozen. Once opened, the pens should be kept at room temperature. Do not store in the refrigerator once opened. Once opened, the insulin can be used for 28 days. After 28 days, the Lantus Solostar Pen or Basaglar KwikPen should be  thrown away. Store Toujeo Solostar Pens in a refrigerator between 2 and 8 degrees C (36 and 46 degrees F). Do not freeze or use if the insulin has been frozen. Once opened, the pens should be kept at room temperature below 30 degrees C (86 degrees F). Do not store in the refrigerator once opened. Once opened, the insulin can be used for 42 days. After 42 days, the Toujeo Solostar Pen should be thrown away. Protect all insulin vials and pens from light and excessive heat. Throw away any unused medicine after the expiration date or after the specified time for room temperature storage has passed. NOTE: This sheet is a summary. It may not cover all possible information. If you have questions about this medicine, talk to your doctor, pharmacist, or health care provider.    2016, [employer]vier/[employer] Standard. (2014-05-03 10:12:42)  

## 2016-03-21 DIAGNOSIS — R42 Dizziness and giddiness: Secondary | ICD-10-CM | POA: Insufficient documentation

## 2016-03-21 DIAGNOSIS — H9121 Sudden idiopathic hearing loss, right ear: Secondary | ICD-10-CM | POA: Insufficient documentation

## 2016-03-21 DIAGNOSIS — H903 Sensorineural hearing loss, bilateral: Secondary | ICD-10-CM | POA: Insufficient documentation

## 2016-03-28 ENCOUNTER — Other Ambulatory Visit (INDEPENDENT_AMBULATORY_CARE_PROVIDER_SITE_OTHER): Payer: Commercial Managed Care - HMO

## 2016-03-28 DIAGNOSIS — Z794 Long term (current) use of insulin: Secondary | ICD-10-CM

## 2016-03-28 DIAGNOSIS — E1165 Type 2 diabetes mellitus with hyperglycemia: Secondary | ICD-10-CM

## 2016-03-28 LAB — HEMOGLOBIN A1C: Hgb A1c MFr Bld: 10.4 % — ABNORMAL HIGH (ref 4.6–6.5)

## 2016-04-05 ENCOUNTER — Other Ambulatory Visit: Payer: Self-pay

## 2016-04-07 NOTE — Addendum Note (Signed)
Addended by: Lurlean Nanny on: 04/07/2016 12:25 PM   Modules accepted: Orders

## 2016-04-16 ENCOUNTER — Other Ambulatory Visit: Payer: Self-pay | Admitting: Otolaryngology

## 2016-04-16 DIAGNOSIS — H9190 Unspecified hearing loss, unspecified ear: Secondary | ICD-10-CM

## 2016-04-18 ENCOUNTER — Ambulatory Visit: Payer: Commercial Managed Care - HMO | Attending: Otolaryngology | Admitting: Physical Therapy

## 2016-04-18 DIAGNOSIS — H8111 Benign paroxysmal vertigo, right ear: Secondary | ICD-10-CM | POA: Diagnosis not present

## 2016-04-18 DIAGNOSIS — R42 Dizziness and giddiness: Secondary | ICD-10-CM | POA: Diagnosis present

## 2016-04-18 NOTE — Therapy (Addendum)
[facility] [address] [place], [place], [address] Phone: [phone]   Fax:  [phone]  Physical Therapy Evaluation  Patient Details  Name: [name] MRN: [id number] Date of Birth: 1955-05-25 Referring Provider: Dr. Melissa Montane  Encounter Date: 04/18/2016      PT End of Session - 04/18/16 1158    Visit Number 1   Number of Visits 8   Date for PT Re-Evaluation 05/16/16   PT Start Time 0830   PT Stop Time 0910   PT Time Calculation (min) 40 min   Activity Tolerance Patient tolerated treatment well   Behavior During Therapy [facility] for tasks assessed/performed      Past Medical History:  Diagnosis Date  . Blood transfusion    1984   [facility]  . Chronic kidney disease    occasional renal stone  . Complication of anesthesia    woke during surgery (ablation) 2006?  . Diabetes mellitus    type 2 Diab- A1C-7  . GERD (gastroesophageal reflux disease)    occasional Protonix-will take protonix night before surgery  . Hypercholesteremia    on meds    Past Surgical History:  Procedure Laterality Date  . BLADDER SUSPENSION  12/08/2010   Procedure: TRANSVAGINAL TAPE (TVT) PROCEDURE;  Surgeon: Arloa Koh;  Location: [facility];  Service: Gynecology;  Laterality: N/A;  transvaginal tape with cystoscopy  . BLADDER SUSPENSION  01/30/2011   Procedure: TRANSVAGINAL TAPE (TVT) PROCEDURE;  Surgeon: Arloa Koh;  Location: [facility];  Service: Gynecology;  Laterality: N/A;  excision of excess sling from suprapubic incision and cystoscopy  . [place] SURGERY  2004  . CYSTOSCOPY  01/30/2011   Procedure: CYSTOSCOPY;  Surgeon: Arloa Koh;  Location: [facility];  Service: Gynecology;  Laterality: N/A;  . TONSILLECTOMY  1976  . transvaginal tape  8/12  . uterine ablation      There were no vitals filed for this visit.       Subjective Assessment - 04/18/16 0832    Subjective Pt is a 60 y/o female who presents to OPPT with  sudden onset of spinning, vomiting approx 7 weeks ago.  Pt went to MD and dx with fluid behind ear (no vertigo) but continued to have spinning and vomiting with all head movements.  Pt referred to ENT, had steroid injection with little benefit.  Pt then referred to OPPT for dizziness.  Pt reports today symptoms are improving but still present.  Pt relying on meclizine to help symptoms.   Pertinent History DM, CKD, onset of R hearing loss with vertigo   Patient Stated Goals improve dizziness, get rid of spinning   Currently in Pain? No/denies            [facility] PT Assessment - 04/18/16 0837      Assessment   Medical Diagnosis dizziness   Referring Provider Dr. Melissa Montane   Onset Date/Surgical Date --  7 weeks ago   Next MD Visit PRN-after MRI   Prior Therapy after neck surgery only     Precautions   Precautions Fall     Restrictions   Weight Bearing Restrictions No     Balance Screen   Has the patient fallen in the past 6 months Yes   How many times? 1-on day vertigo started; reports she "stumbles every day"   Has the patient had a decrease in activity level because of a fear of falling?  Yes   Is the patient  reluctant to leave their home because of a fear of falling?  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; walk on [employer]   Overall Cognitive Status Within Functional Limits for tasks assessed     Observation/Other Assessments   Focus on Therapeutic Outcomes (FOTO)  71 (29% limited; predicted 14% limited)   Dizziness Handicap Inventory ([place])  60 (severe)            Vestibular Assessment - 04/18/16 0841      Symptom Behavior   Type  of Dizziness Spinning   Frequency of Dizziness every morning   Duration of Dizziness 15-30 sec (reports 2 weeks ago 15-30 min)   Aggravating Factors Mornings;Forward bending;Lying supine   Relieving Factors Medication  meclizine     Occulomotor Exam   Occulomotor Alignment Normal   Spontaneous Absent   Gaze-induced Absent   Smooth Pursuits Intact   Saccades Intact     Vestibulo-Occular Reflex   VOR 1 Head Only (x 1 viewing) WNL   Comment head thrust positive to L     Positional Testing   Dix-Hallpike Dix-Hallpike Right;Dix-Hallpike Left   Sidelying Test --   Horizontal Canal Testing Horizontal Canal Right;Horizontal Canal Left     Dix-Hallpike Right   Dix-Hallpike Right Duration 10 sec - c/o spinning, but no nystagmus seen   Dix-Hallpike Right Symptoms No nystagmus     Dix-Hallpike Left   Dix-Hallpike Left Duration none   Dix-Hallpike Left Symptoms No nystagmus     Horizontal Canal Right   Horizontal Canal Right Duration none   Horizontal Canal Right Symptoms Normal     Horizontal Canal Left   Horizontal Canal Left Duration < 1 sec   Horizontal Canal Left Symptoms Normal                Vestibular Treatment/Exercise - 04/18/16 1153      Vestibular Treatment/Exercise   Vestibular Treatment Provided Canalith Repositioning   Canalith Repositioning Epley Manuever Right      EPLEY MANUEVER RIGHT   Number of Reps  1   Response Details  only performed 1 rep per pt request; concerned she would aggravate symptoms               PT Education - 04/18/16 1157    Education provided Yes   Education Details BPPV, possible labryinthitis   Person(s) Educated Patient   Methods Explanation;Handout   Comprehension Verbalized understanding             PT Long Term Goals - 04/18/16 1202      PT LONG TERM GOAL #1   Title independent with HEP (Target Date for all LTGs: 05/16/16)   Time 4   Period Weeks   Status New     PT LONG TERM GOAL #2   Title  improve DHI to < 45% for improved function and mobility    Status New     PT LONG TERM GOAL #3   Title demonstrate negative positional testing   Status New     PT LONG TERM GOAL #4   Title ambulate independently on unlevel surfaces  with head turns without LOB for safe community mobility   Status New               Plan - 04/18/16 1158    Clinical Impression Statement Pt is a 60 y/o female who presents to OPPT for moderate complexity PT eval for vertigo.  Pt reports she's been taking meclizine for 5 weeks so system likely depressed during eval.  Pt symptomatic for R BPPV in R dix hallpike but no nystagmus present.  Treated for R BPPV with 1 rep of epleys today.  Cannot completely rule out R labryinthitis at this time, so will montior and tx as indicated.  Will benefit from PT to address symptoms and maxiimize function.   Rehab Potential Good   Clinical Impairments Affecting Rehab Potential severity of deficits   PT Frequency 2x / week   PT Duration 4 weeks   PT Treatment/Interventions ADLs/Self Care Home Management;Canalith Repostioning;Neuromuscular re-education;Therapeutic exercise;Balance training;Therapeutic activities;Functional mobility training;Gait training;Patient/family education;Vestibular   PT Next Visit Plan reassess head impulse and positional testing; tx BPPV PRN and initiate habituation and vestibular exercises   Consulted and Agree with Plan of Care Patient      Patient will benefit from skilled therapeutic intervention in order to improve the following deficits and impairments:  Decreased balance, Decreased mobility, Dizziness  Visit Diagnosis: BPPV (benign paroxysmal positional vertigo), right - Plan: PT plan of care cert/re-cert  Dizziness and giddiness - Plan: PT plan of care cert/re-cert     Problem List Patient Active Problem List   Diagnosis Date Noted  . BPV (benign positional vertigo) 03/12/2016  . DM type 2 (diabetes mellitus, type 2) ([place])  04/27/2013  . Hypercholesteremia   . GERD (gastroesophageal reflux disease)        Laureen Abrahams, PT, DPT 04/18/16 12:05 PM    [facility] [address] [place], [place], [address] Phone: [phone]   Fax:  [phone]  Name: [name] MRN: [id number] Date of Birth: 1956-02-28        PHYSICAL THERAPY DISCHARGE SUMMARY  Visits from Start of Care: 1  Current functional level related to goals / functional outcomes: See above; pt did not return.  Pt cx remaining appointments stating she was "doing OK."   Remaining deficits: Unknown as pt did not return   Education / Equipment: n/a  Plan: Patient agrees to discharge.  Patient goals were not met. Patient is being discharged due to not returning since the last visit.  ?????     Laureen Abrahams, PT, DPT 05/21/16 1:19 PM  [facility] [address] [place], [place] [address]  [phone] (office) [phone] (fax)

## 2016-04-19 ENCOUNTER — Ambulatory Visit: Payer: Commercial Managed Care - HMO

## 2016-04-23 ENCOUNTER — Ambulatory Visit: Payer: Commercial Managed Care - HMO | Admitting: Physical Therapy

## 2016-04-26 ENCOUNTER — Encounter: Payer: Commercial Managed Care - HMO | Admitting: Physical Therapy

## 2016-05-09 ENCOUNTER — Ambulatory Visit
Admission: RE | Admit: 2016-05-09 | Discharge: 2016-05-09 | Disposition: A | Discharge status: Home / Self Care | Payer: Commercial Managed Care - HMO | Source: Ambulatory Visit | Attending: Otolaryngology | Admitting: Otolaryngology

## 2016-05-09 DIAGNOSIS — H902 Conductive hearing loss, unspecified: Secondary | ICD-10-CM | POA: Diagnosis not present

## 2016-05-09 DIAGNOSIS — R42 Dizziness and giddiness: Secondary | ICD-10-CM | POA: Diagnosis not present

## 2016-05-09 DIAGNOSIS — H9190 Unspecified hearing loss, unspecified ear: Secondary | ICD-10-CM

## 2016-05-09 MED ORDER — GADOBENATE DIMEGLUMINE 529 MG/ML IV SOLN
18.0000 mL | Freq: Once | INTRAVENOUS | Status: AC | PRN
  Administered 2016-05-09: 18 mL via INTRAVENOUS

## 2016-06-05 ENCOUNTER — Other Ambulatory Visit: Payer: Self-pay | Admitting: Internal Medicine

## 2016-06-05 ENCOUNTER — Other Ambulatory Visit: Payer: Self-pay

## 2016-06-05 MED ORDER — SITAGLIPTIN PHOSPHATE 100 MG PO TABS: 100.0000 mg | ORAL_TABLET | Freq: Every day | ORAL | 1 refills | Status: DC

## 2016-06-05 MED ORDER — MECLIZINE HCL 25 MG PO TABS: 25.0000 mg | ORAL_TABLET | Freq: Three times a day (TID) | ORAL | 0 refills | Status: DC | PRN

## 2016-06-05 NOTE — Telephone Encounter (Signed)
Last filled 03/26/16--please advise 

## 2016-06-18 ENCOUNTER — Encounter: Payer: Self-pay | Admitting: Physical Therapy

## 2016-06-18 ENCOUNTER — Ambulatory Visit: Payer: Commercial Managed Care - HMO | Attending: Otolaryngology | Admitting: Physical Therapy

## 2016-06-18 DIAGNOSIS — R42 Dizziness and giddiness: Secondary | ICD-10-CM | POA: Insufficient documentation

## 2016-06-18 DIAGNOSIS — H8111 Benign paroxysmal vertigo, right ear: Secondary | ICD-10-CM | POA: Diagnosis present

## 2016-06-18 DIAGNOSIS — R2681 Unsteadiness on feet: Secondary | ICD-10-CM | POA: Diagnosis not present

## 2016-06-18 NOTE — Therapy (Signed)
[facility] [address] [place], [place], [address] Phone: [phone]   Fax:  [phone]  Physical Therapy Evaluation  Patient Details  Name: [name] MRN: [id number] Date of Birth: 1956/01/08 Referring Provider: Melissa Montane, MD  Encounter Date: 06/18/2016      PT End of Session - 06/18/16 1014    Visit Number 1   Number of Visits 4   Date for PT Re-Evaluation 07/18/16   Authorization Type [employer]   PT Start Time 0920   PT Stop Time 1010   PT Time Calculation (min) 50 min   Activity Tolerance Patient tolerated treatment well   Behavior During Therapy [facility] for tasks assessed/performed      Past Medical History:  Diagnosis Date  . Blood transfusion    1984   [place]  . Chronic kidney disease    occasional renal stone  . Complication of anesthesia    woke during surgery (ablation) 2006?  . Diabetes mellitus    type 2 Diab- A1C-7  . GERD (gastroesophageal reflux disease)    occasional Protonix-will take protonix night before surgery  . Hypercholesteremia    on meds    Past Surgical History:  Procedure Laterality Date  . BLADDER SUSPENSION  12/08/2010   Procedure: TRANSVAGINAL TAPE (TVT) PROCEDURE;  Surgeon: Arloa Koh;  Location: [facility];  Service: Gynecology;  Laterality: N/A;  transvaginal tape with cystoscopy  . BLADDER SUSPENSION  01/30/2011   Procedure: TRANSVAGINAL TAPE (TVT) PROCEDURE;  Surgeon: Arloa Koh;  Location: [facility];  Service: Gynecology;  Laterality: N/A;  excision of excess sling from suprapubic incision and cystoscopy  . [place] SURGERY  2004  . CYSTOSCOPY  01/30/2011   Procedure: CYSTOSCOPY;  Surgeon: Arloa Koh;  Location: [facility];  Service: Gynecology;  Laterality: N/A;  . TONSILLECTOMY  1976  . transvaginal tape  8/12  . uterine ablation      There were no vitals filed for this visit.       Subjective Assessment - 06/18/16 0926    Subjective Pt is a 61  y/o female who presents to OPPT with return of sudden onset of spinning, nausea and vomiting 1.5 weeks ago after being symptoms free x 3 weeks.  Hearing is still absent in R ear; has microphone in R ear.  MRI completed-no acute findings.  Pt reports symptoms today but took Meclizine in order to function.  No falls but reports imbalance.   Pertinent History DM, CKD, onset of R hearing loss with vertigo   Patient Stated Goals improve dizziness, get rid of spinning   Currently in Pain? No/denies  took meclizine this am            [facility] PT Assessment - 06/18/16 0915      Assessment   Medical Diagnosis dizziness   Referring Provider Melissa Montane, MD   Onset Date/Surgical Date --  4 months ago   Next MD Visit PRN    Prior Therapy Yes-December for dizziness-1 visit only     Precautions   Precautions Fall     Balance Screen   Has the patient fallen in the past 6 months No   Has the patient had a decrease in activity level because of a fear of falling?  Yes   Is the patient reluctant to leave their home because of a fear of falling?  Yes     [place] residence   Living  Arrangements Spouse/significant other   Type of [place] Access Level entry   [place] One level     Prior Function   Level of Independence Independent   Vocation Retired   [employer] retired from [employer] was exercising 3x/wk; walk on beach     Observation/Other Assessments   Focus on Therapeutic Outcomes (FOTO)  Did not complete            Vestibular Assessment - 06/18/16 0931      Symptom Behavior   Type of Dizziness Spinning   Frequency of Dizziness every morning   Duration of Dizziness half a day   Aggravating Factors Mornings;Forward bending;Lying supine;Turning body quickly;Turning head quickly   Relieving Factors Medication     Occulomotor Exam   Occulomotor Alignment Normal   Spontaneous Absent    Gaze-induced Absent   Smooth Pursuits Intact   Saccades Intact   Comment Convergence impaired     Vestibulo-Occular Reflex   VOR 1 Head Only (x 1 viewing) WNL   VOR to Slow Head Movement Normal   VOR Cancellation Normal   Comment negative bilat     Positional Testing   Dix-Hallpike Dix-Hallpike Right;Dix-Hallpike Left   Sidelying Test Sidelying Right;Sidelying Left   Horizontal Canal Testing Horizontal Canal Right;Horizontal Canal Left     Dix-Hallpike Right   Dix-Hallpike Right Duration 30 sec-c/o spinning but no nystagmus seen   Dix-Hallpike Right Symptoms No nystagmus     Dix-Hallpike Left   Dix-Hallpike Left Duration 5 seconds   Dix-Hallpike Left Symptoms No nystagmus     Sidelying Right   Sidelying Right Duration 30 seconds   Sidelying Right Symptoms Upbeat, right rotatory nystagmus     Sidelying Left   Sidelying Left Duration 0   Sidelying Left Symptoms No nystagmus     Horizontal Canal Right   Horizontal Canal Right Duration 0   Horizontal Canal Right Symptoms Normal     Horizontal Canal Left   Horizontal Canal Left Duration 0   Horizontal Canal Left Symptoms Normal                Vestibular Treatment/Exercise - 06/18/16 0954      Vestibular Treatment/Exercise   Vestibular Treatment Provided Canalith Repositioning   Canalith Repositioning Epley Manuever Right   Habituation Exercises Nestor Lewandowsky           PT Education - 06/18/16 1013    Education provided Yes   Education Details BPPV, self treatment   Person(s) Educated Patient   Methods Explanation;Demonstration;Handout   Comprehension Verbalized understanding;Returned demonstration           PT Long Term Goals - 06/18/16 1020      PT LONG TERM GOAL #1   Title independent with HEP (Target Date for all LTGs: 07/18/16)   Time 4   Period Weeks   Status New     PT LONG TERM GOAL #2   Title report ability to return to normal activities including the gym for exercise   Status  New     PT LONG TERM GOAL #3   Title demonstrate negative positional testing   Status New     PT LONG TERM GOAL #4   Title ambulate independently on unlevel/sandy surfaces with head turns without LOB for safe community mobility and to prepare for out of country beach trip   Status New  Plan - 06/18/16 1015    Clinical Impression Statement Pt is a 61 y/o female who presents to OPPT for moderate complexity PT eval for recurrent vertigo.  Pt reports taking meclizine this am so system was likely depressed during evaluation.  Pt symptomatic for R BPPV in R Dix-Hallpike and R Sidelying test with nystagmus present in sidelying test.  Treated for R BPPV with 2 canalith repositioning maneuvers without resolution of sypmptoms. Pt given Nestor Lewandowsky for home self-treatment and advised to return in one week to further assess vestibular function.  Pt will benefit from PT services to address dizziness and balance impairments to maximize functional mobility independence and reduce falls risk.   Rehab Potential Good   Clinical Impairments Affecting Rehab Potential reoccurence of symptoms   PT Frequency 1x / week   PT Duration 4 weeks   PT Treatment/Interventions ADLs/Self Care Home Management;Canalith Repostioning;Neuromuscular re-education;Therapeutic exercise;Balance training;Therapeutic activities;Functional mobility training;Gait training;Patient/family education;Vestibular   PT Next Visit Plan reassess head impulse and positional testing, DVA, tx BPPV PRN; provide gaze adaptation (x1 viewing) exercises   Consulted and Agree with Plan of Care Patient      Patient will benefit from skilled therapeutic intervention in order to improve the following deficits and impairments:  Decreased balance, Decreased mobility, Dizziness, Decreased activity tolerance  Visit Diagnosis: BPPV (benign paroxysmal positional vertigo), right  Dizziness and giddiness  Unsteadiness on  feet     Problem List Patient Active Problem List   Diagnosis Date Noted  . BPV (benign positional vertigo) 03/12/2016  . DM type 2 (diabetes mellitus, type 2) ([place]) 04/27/2013  . Hypercholesteremia   . GERD (gastroesophageal reflux disease)     Raylene Everts, PT, DPT 06/18/16    10:33 AM    [facility] [address] [place], [place], [address] Phone: [phone]   Fax:  [phone]  Name: [name] MRN: [id number] Date of Birth: 1956/05/01

## 2016-06-18 NOTE — Patient Instructions (Signed)
Habituation - Tip Card  1.The goal of habituation training is to assist in decreasing symptoms of vertigo, dizziness, or nausea provoked by specific head and body motions. 2.These exercises may initially increase symptoms; however, be persistent and work through symptoms. With repetition and time, the exercises will assist in reducing or eliminating symptoms. 3.Exercises should be stopped and discussed with the therapist if you experience any of the following: - Sudden change or fluctuation in hearing - New onset of ringing in the ears, or increase in current intensity - Any fluid discharge from the ear - Severe pain in neck or back - Extreme nausea  Habituation - Sit to Side-Lying   Sit on edge of bed. Lie down onto the right side and hold until dizziness stops, plus 20 seconds.  Return to sitting and wait until dizziness stops, plus 20 seconds.  Repeat to the left side. Repeat sequence 5 times per session. Do 2 sessions per day.  Copyright  VHI. All rights reserved.     

## 2016-06-25 ENCOUNTER — Encounter: Payer: Commercial Managed Care - HMO | Admitting: Physical Therapy

## 2016-07-02 ENCOUNTER — Encounter: Payer: Commercial Managed Care - HMO | Admitting: Internal Medicine

## 2016-07-03 ENCOUNTER — Other Ambulatory Visit: Payer: Self-pay | Admitting: Internal Medicine

## 2016-07-03 DIAGNOSIS — E1165 Type 2 diabetes mellitus with hyperglycemia: Secondary | ICD-10-CM

## 2016-07-03 DIAGNOSIS — Z794 Long term (current) use of insulin: Principal | ICD-10-CM

## 2016-07-04 ENCOUNTER — Ambulatory Visit: Payer: Commercial Managed Care - HMO | Admitting: Physical Therapy

## 2016-07-04 ENCOUNTER — Encounter: Payer: Self-pay | Admitting: Physical Therapy

## 2016-07-04 DIAGNOSIS — H8111 Benign paroxysmal vertigo, right ear: Secondary | ICD-10-CM | POA: Diagnosis not present

## 2016-07-04 DIAGNOSIS — R42 Dizziness and giddiness: Secondary | ICD-10-CM

## 2016-07-04 DIAGNOSIS — R2681 Unsteadiness on feet: Secondary | ICD-10-CM

## 2016-07-04 NOTE — Patient Instructions (Signed)
Gaze Stabilization - Tip Card  1.Target must remain in focus, not blurry, and appear stationary while head is in motion. 2.Perform exercises with small head movements (45 to either side of midline). 3.Increase speed of head motion so long as target is in focus. 4.If you wear eyeglasses, be sure you can see target through lens (therapist will give specific instructions for bifocal / progressive lenses). 5.These exercises may provoke dizziness or nausea. Work through these symptoms. If too dizzy, slow head movement slightly. Rest between each exercise. 6.Exercises demand concentration; avoid distractions. 7.For safety, perform standing exercises close to a counter, [place], corner, or next to someone.  Copyright  VHI. All rights reserved.   Gaze Stabilization - Standing Feet Apart   Feet shoulder width apart, keeping eyes on target on [place] 3 feet away, tilt head down slightly and move head side to side for 30 seconds. Repeat while moving head up and down for 30 seconds. *Work up to tolerating 60 seconds, as able. Do 2-3 sessions per day.   Copyright  VHI. All rights reserved.    

## 2016-07-04 NOTE — Therapy (Signed)
[facility] [address] [place], [place], [address] Phone: [phone]   Fax:  [phone]  Physical Therapy Treatment  Patient Details  Name: DESHAY KALTENBACH MRN: [id number] Date of Birth: November 25, 1955 Referring Provider: Melissa Montane, MD  Encounter Date: 07/04/2016      PT End of Session - 07/04/16 1248    Visit Number 2   Number of Visits 4   Date for PT Re-Evaluation 07/18/16   Authorization Type [employer]   PT Start Time 1153   PT Stop Time 1230   PT Time Calculation (min) 37 min   Activity Tolerance Other (comment)  vertigo/nausea   Behavior During Therapy WFL for tasks assessed/performed      Past Medical History:  Diagnosis Date  . Blood transfusion    1984   [place]  . Chronic kidney disease    occasional renal stone  . Complication of anesthesia    woke during surgery (ablation) 2006?  . Diabetes mellitus    type 2 Diab- A1C-7  . GERD (gastroesophageal reflux disease)    occasional Protonix-will take protonix night before surgery  . Hypercholesteremia    on meds    Past Surgical History:  Procedure Laterality Date  . BLADDER SUSPENSION  12/08/2010   Procedure: TRANSVAGINAL TAPE (TVT) PROCEDURE;  Surgeon: Arloa Koh;  Location: [facility];  Service: Gynecology;  Laterality: N/A;  transvaginal tape with cystoscopy  . BLADDER SUSPENSION  01/30/2011   Procedure: TRANSVAGINAL TAPE (TVT) PROCEDURE;  Surgeon: Arloa Koh;  Location: [facility];  Service: Gynecology;  Laterality: N/A;  excision of excess sling from suprapubic incision and cystoscopy  . [place] SURGERY  2004  . CYSTOSCOPY  01/30/2011   Procedure: CYSTOSCOPY;  Surgeon: Arloa Koh;  Location: [facility];  Service: Gynecology;  Laterality: N/A;  . TONSILLECTOMY  1976  . transvaginal tape  8/12  . uterine ablation      There were no vitals filed for this visit.      Subjective Assessment - 07/04/16 1200    Subjective Pt reports  improvement in overall symptoms and was considering cancelling today's session; does report brief episodes of dysequilibrium when first sitting upright.  Has been performing exercises "religiously."  Has been sleeping on incline pillow with some relief but slides off of pillow.   Pertinent History DM, CKD, onset of R hearing loss with vertigo   Patient Stated Goals improve dizziness, get rid of spinning   Currently in Pain? No/denies           Vestibular Assessment - 07/04/16 1203      Visual Acuity   Static 7   Dynamic 2     Positional Testing   Dix-Hallpike Dix-Hallpike Right;Dix-Hallpike Left   Sidelying Test Sidelying Right     Dix-Hallpike Right   Dix-Hallpike Right Duration 30 seconds   Dix-Hallpike Right Symptoms Upbeat, right rotatory nystagmus     Dix-Hallpike Left   Dix-Hallpike Left Duration 0   Dix-Hallpike Left Symptoms No nystagmus     Sidelying Right   Sidelying Right Duration 30 seconds   Sidelying Right Symptoms Upbeat, right rotatory nystagmus           Vestibular Treatment/Exercise - 07/04/16 1245      Vestibular Treatment/Exercise   Vestibular Treatment Provided Canalith Repositioning;Gaze   Canalith Repositioning Epley Manuever Right   Gaze Exercises X1 Viewing Horizontal;X1 Viewing Vertical      EPLEY MANUEVER RIGHT   Number of Reps  2   Overall Response Symptoms Worsened   Response Details  increased nystagmus and vertigo during second testing     X1 Viewing Horizontal   Foot Position apart   Reps 1   Comments standing, 60ft, 10 seconds     X1 Viewing Vertical   Foot Position apart   Reps 1   Comments standing, 47ft, 10 seconds               PT Education - 07/04/16 1247    Education provided Yes   Education Details impaired gaze stability, x 1 viewing   Person(s) Educated Patient   Methods Demonstration;Explanation;Handout   Comprehension Verbalized understanding             PT Long Term Goals - 06/18/16 1020       PT LONG TERM GOAL #1   Title independent with HEP (Target Date for all LTGs: 07/18/16)   Time 4   Period Weeks   Status New     PT LONG TERM GOAL #2   Title report ability to return to normal activities including the gym for exercise   Status New     PT LONG TERM GOAL #3   Title demonstrate negative positional testing   Status New     PT LONG TERM GOAL #4   Title ambulate independently on unlevel/sandy surfaces with head turns without LOB for safe community mobility and to prepare for out of country beach trip   Status New               Plan - 07/04/16 1249    Clinical Impression Statement Pt initially with improvement in symptoms but when canals tested and pt reporting mild vertigo during R Dix-Hallpike pt treated with one CRP resulting in worsening of symptoms.  Treated again with pt reporting mild nausea and blurring of vision.  Pt able to participate in DVA with 5 line difference indicating impaired gaze stabilization.  Pt provided x 1 viewing adaptation for HEP and instructed to continue habituation exercises as well.  Pt to return in one week to re-assess.   Clinical Impairments Affecting Rehab Potential reoccurence of symptoms   PT Treatment/Interventions ADLs/Self Care Home Management;Canalith Repostioning;Neuromuscular re-education;Therapeutic exercise;Balance training;Therapeutic activities;Functional mobility training;Gait training;Patient/family education;Vestibular   PT Next Visit Plan check x 1 viewing, re-assess HIT and all canals (cupulo?)   Consulted and Agree with Plan of Care Patient      Patient will benefit from skilled therapeutic intervention in order to improve the following deficits and impairments:  Decreased balance, Decreased mobility, Dizziness, Decreased activity tolerance  Visit Diagnosis: BPPV (benign paroxysmal positional vertigo), right  Dizziness and giddiness  Unsteadiness on feet     Problem List Patient Active Problem List    Diagnosis Date Noted  . BPV (benign positional vertigo) 03/12/2016  . DM type 2 (diabetes mellitus, type 2) ([place]) 04/27/2013  . Hypercholesteremia   . GERD (gastroesophageal reflux disease)    Raylene Everts, PT, DPT 07/04/16    12:54 PM    [facility] [address] [place], [place], [address] Phone: [phone]   Fax:  [phone]  Name: ASHLEYN AGNE MRN: [id number] Date of Birth: 03-22-1956

## 2016-07-05 ENCOUNTER — Ambulatory Visit (INDEPENDENT_AMBULATORY_CARE_PROVIDER_SITE_OTHER): Payer: Commercial Managed Care - HMO | Admitting: Internal Medicine

## 2016-07-05 ENCOUNTER — Encounter: Payer: Self-pay | Admitting: Internal Medicine

## 2016-07-05 VITALS — BP 126/80 | HR 78 | Temp 97.7°F | Wt 194.5 lb

## 2016-07-05 DIAGNOSIS — Z0000 Encounter for general adult medical examination without abnormal findings: Secondary | ICD-10-CM | POA: Diagnosis not present

## 2016-07-05 DIAGNOSIS — Z23 Encounter for immunization: Secondary | ICD-10-CM

## 2016-07-05 DIAGNOSIS — Z794 Long term (current) use of insulin: Secondary | ICD-10-CM

## 2016-07-05 DIAGNOSIS — Z114 Encounter for screening for human immunodeficiency virus [HIV]: Secondary | ICD-10-CM | POA: Diagnosis not present

## 2016-07-05 DIAGNOSIS — E1165 Type 2 diabetes mellitus with hyperglycemia: Secondary | ICD-10-CM

## 2016-07-05 DIAGNOSIS — Z1159 Encounter for screening for other viral diseases: Secondary | ICD-10-CM | POA: Diagnosis not present

## 2016-07-05 LAB — COMPREHENSIVE METABOLIC PANEL
ALT: 39 U/L — ABNORMAL HIGH (ref 0–35)
AST: 22 U/L (ref 0–37)
Albumin: 4.7 g/dL (ref 3.5–5.2)
Alkaline Phosphatase: 52 U/L (ref 39–117)
BILIRUBIN TOTAL: 0.5 mg/dL (ref 0.2–1.2)
BUN: 17 mg/dL (ref 6–23)
CALCIUM: 10 mg/dL (ref 8.4–10.5)
CO2: 28 meq/L (ref 19–32)
Chloride: 100 mEq/L (ref 96–112)
Creatinine, Ser: 0.87 mg/dL (ref 0.40–1.20)
GFR: 70.97 mL/min (ref >60.00)
Glucose, Bld: 157 mg/dL — ABNORMAL HIGH (ref 70–99)
POTASSIUM: 4.3 meq/L (ref 3.5–5.1)
Sodium: 136 mEq/L (ref 135–145)
Total Protein: 7.8 g/dL (ref 6.0–8.3)

## 2016-07-05 LAB — HEMOGLOBIN A1C: Hgb A1c MFr Bld: 9.8 % — ABNORMAL HIGH (ref 4.6–6.5)

## 2016-07-05 LAB — LIPID PANEL
Cholesterol: 195 mg/dL (ref 0–200)
HDL: 56.4 mg/dL (ref >39.00)
NonHDL: 138.88
TRIGLYCERIDES: 293 mg/dL — AB (ref 0.0–149.0)
Total CHOL/HDL Ratio: 3
VLDL: 58.6 mg/dL — AB (ref 0.0–40.0)

## 2016-07-05 LAB — CBC
HEMATOCRIT: 40.1 % (ref 36.0–46.0)
HEMOGLOBIN: 13.3 g/dL (ref 12.0–15.0)
MCHC: 33.1 g/dL (ref 30.0–36.0)
MCV: 87 fl (ref 78.0–100.0)
PLATELETS: 288 10*3/uL (ref 150.0–400.0)
RBC: 4.61 Mil/uL (ref 3.87–5.11)
RDW: 12.8 % (ref 11.5–15.5)
WBC: 8.9 10*3/uL (ref 4.0–10.5)

## 2016-07-05 LAB — MICROALBUMIN / CREATININE URINE RATIO
Creatinine,U: 36.3 mg/dL
MICROALB/CREAT RATIO: 1.9 mg/g (ref 0.0–30.0)
Microalb, Ur: <0.7 mg/dL (ref 0.0–1.9)

## 2016-07-05 LAB — LDL CHOLESTEROL, DIRECT: Direct LDL: 99 mg/dL

## 2016-07-05 NOTE — Assessment & Plan Note (Signed)
A1C and microalbumin today Encouraged her to consume a low carb, low fat diet Encouraged her to continue to check her feet Eye exam due next month Flu shot UTD Pneumovax UTD Continue Lantus, Metformin, Glipizide and Januvia If A1C > 9, will refer to endocrinology (she prefers [place], not Dr. Soyla Murphy)

## 2016-07-05 NOTE — Progress Notes (Signed)
Subjective:    Patient ID: [name], female    DOB: 06-29-1955, 61 y.o.   MRN: [id number]  HPI  Pt presents to the clinic today for her annual exam. She is also due for follow up of DM 2.  DM 2: Her last A1C was 10.2%, 03/2016. Her Lantus was increased to 45 units daily. She continued her Metformin, Glipizide and Januvia. Her blood sugars range 110-178. Her last eye exam was 08/2015. She checks her feet daily.  Flu: 02/2016 Tetanus: 11/2011 Pneumovax: 2011 Pap Smear: 12/2015 Mammogram: 07/2015 Colon Screening: 01/2014 Vision Screening: yearly Dentist: every 3 months  Diet: She does eat meat. She consumes fruits and veggies daily. She tries to avoid fried foods. She drinks mostly water. Exercise: None recently due to BPPV  Review of Systems      Past Medical History:  Diagnosis Date  . Blood transfusion    1984   [place]  . Chronic kidney disease    occasional renal stone  . Complication of anesthesia    woke during surgery (ablation) 2006?  . Diabetes mellitus    type 2 Diab- A1C-7  . GERD (gastroesophageal reflux disease)    occasional Protonix-will take protonix night before surgery  . Hypercholesteremia    on meds    Current Outpatient Prescriptions  Medication Sig Dispense Refill  . B-D UF III MINI PEN NEEDLES 31G X 5 MM MISC 2 (two) times daily. as directed  6  . [facility] NICOTINE 7 MG/24HR patch PLACE 1 PATCH (7 MG TOTAL) ONTO THE SKIN DAILY. (Patient not taking: Reported on 06/18/2016) 28 patch 1  . fenofibrate (TRICOR) 145 MG tablet Take 1 tablet (145 mg total) by mouth daily. 30 tablet 5  . glipiZIDE (GLUCOTROL) 10 MG tablet TAKE 1 TABLET (10 MG TOTAL) BY MOUTH 2 (TWO) TIMES DAILY BEFORE A MEAL. 60 tablet 0  . glucose blood (FREESTYLE LITE) test strip Use as instructed to test blood sugar once daily E11.9 100 each 12  . Insulin Glargine (LANTUS) 100 UNIT/ML Solostar Pen Inject 45 Units into the skin daily at 10 pm.    . meclizine (ANTIVERT) 25 MG tablet Take 1  tablet (25 mg total) by mouth 3 (three) times daily as needed for dizziness. 30 tablet 0  . metFORMIN (GLUCOPHAGE) 1000 MG tablet Take 1 tablet (1,000 mg total) by mouth 2 (two) times daily with a meal. 180 tablet 1  . mometasone (ELOCON) 0.1 % cream APPLY 1 APPLICATION APPLY ON THE SKIN TWICE A DAY  3  . Multiple Vitamins-Minerals (MULTIVITAMINS THER. W/MINERALS) TABS Take 1 tablet by mouth daily.      . mupirocin ointment (BACTROBAN) 2 % Place 1 application into the nose 2 (two) times daily. (Patient not taking: Reported on 04/18/2016) 22 g 0  . Omega-3 Fatty Acids (FISH OIL) 500 MG CAPS Take 500 mg by mouth daily.    Marland Kitchen OVER THE COUNTER MEDICATION Take 1 tablet by mouth 2 (two) times daily. PATIENT TAKES CINNAMON TABLETS 1000mg     . pantoprazole (PROTONIX) 40 MG tablet Take 1 tablet by mouth daily.  3  . Pitavastatin Calcium (LIVALO) 2 MG TABS Take 1 tablet (2 mg total) by mouth daily. 30 tablet 5  . sitaGLIPtin (JANUVIA) 100 MG tablet Take 1 tablet (100 mg total) by mouth daily. 30 tablet 1   No current facility-administered medications for this visit.     Allergies  Allergen Reactions  . Invokana [Canagliflozin]     Skin peelin  .  Jardiance [Empagliflozin]     Myalgias    Family History  Problem Relation Age of Onset  . Hypertension Mother   . Hyperlipidemia Mother   . Heart disease Mother   . Heart disease Father   . Hyperlipidemia Father   . Diabetes Father   . Heart attack Father   . Varicose Veins Father   . Peripheral vascular disease Father     amputation  . Cancer Paternal Aunt     lung/ brain    Social History   Social History  . Marital status: Married    Spouse name: N/A  . Number of children: N/A  . Years of education: N/A   Occupational History  . Not on file.   Social History Main Topics  . Smoking status: Former Smoker    Packs/day: 0.75    Years: 30.00    Types: Cigarettes  . Smokeless tobacco: Never Used     Comment: recently quit 03/01/2015    . Alcohol use No  . Drug use: No  . Sexual activity: Yes   Other Topics Concern  . Not on file   Social History Narrative  . No narrative on file     Constitutional: Denies fever, malaise, fatigue, headache or abrupt weight changes.  HEENT: Denies eye pain, eye redness, ear pain, ringing in the ears, wax buildup, runny nose, nasal congestion, bloody nose, or sore throat. Respiratory: Denies difficulty breathing, shortness of breath, cough or sputum production.   Cardiovascular: Denies chest pain, chest tightness, palpitations or swelling in the hands or feet.  Gastrointestinal: Pt reports intermittent nausea. Denies abdominal pain, bloating, constipation, diarrhea or blood in the stool.  GU: Denies urgency, frequency, pain with urination, burning sensation, blood in urine, odor or discharge. Musculoskeletal: Denies decrease in range of motion, difficulty with gait, muscle pain or joint pain and swelling.  Skin: Denies redness, rashes, lesions or ulcercations.  Neurological: Pt reports intermittent dizziness. Denies difficulty with memory, difficulty with speech or problems with balance and coordination.  Psych: Denies anxiety, depression, SI/HI.  No other specific complaints in a complete review of systems (except as listed in HPI above).  Objective:   Physical Exam   BP 126/80   Pulse 78   Temp 97.7 F (36.5 C) (Oral)   Wt 194 lb 8 oz (88.2 kg)   BMI 34.45 kg/m  Wt Readings from Last 3 Encounters:  07/05/16 194 lb 8 oz (88.2 kg)  03/15/16 192 lb 8 oz (87.3 kg)  03/06/16 191 lb 8 oz (86.9 kg)    General: Appears her stated age, obese in NAD. Skin: Warm, dry and intact. No ulcerations noted. HEENT: Head: normal shape and size; Eyes: sclera white, no icterus, conjunctiva pink, PERRLA and EOMs intact; Ears: Tm's gray and intact, normal light reflex; Throat/Mouth: Teeth present, mucosa pink and moist, no exudate, lesions or ulcerations noted.  Neck:  Neck supple, trachea  midline. No masses, lumps or thyromegaly present.  Cardiovascular: Normal rate and rhythm. S1,S2 noted.  No murmur, rubs or gallops noted. No JVD or BLE edema. No carotid bruits noted. Pulmonary/Chest: Normal effort and positive vesicular breath sounds. No respiratory distress. No wheezes, rales or ronchi noted.  Abdomen: Soft and nontender. Normal bowel sounds. No distention or masses noted. Liver, spleen and kidneys non palpable. Musculoskeletal: Strength 5/5 BUE/BLE. No difficulty with gait.  Neurological: Alert and oriented. Cranial nerves II-XII grossly intact. Coordination normal.  Psychiatric: Mood and affect normal. Behavior is normal. Judgment and thought content  normal.    BMET    Component Value Date/Time   NA 139 12/27/2015 0910   K 3.5 12/27/2015 0910   CL 103 12/27/2015 0910   CO2 27 12/27/2015 0910   GLUCOSE 122 (H) 12/27/2015 0910   BUN 16 12/27/2015 0910   CREATININE 0.80 12/27/2015 0910   CREATININE 0.65 01/07/2014 1402   CALCIUM 9.2 12/27/2015 0910   GFRNONAA >89 01/07/2014 1402   GFRAA >89 01/07/2014 1402    Lipid Panel     Component Value Date/Time   CHOL 196 12/27/2015 0910   TRIG 238.0 (H) 12/27/2015 0910   HDL 54.70 12/27/2015 0910   CHOLHDL 4 12/27/2015 0910   VLDL 47.6 (H) 12/27/2015 0910   LDLCALC 96 03/24/2015 1524    CBC    Component Value Date/Time   WBC 10.9 (H) 03/24/2015 1524   RBC 4.70 03/24/2015 1524   HGB 13.6 03/24/2015 1524   HCT 40.7 03/24/2015 1524   PLT 278.0 03/24/2015 1524   MCV 86.7 03/24/2015 1524   MCH 29.2 01/07/2014 1402   MCHC 33.4 03/24/2015 1524   RDW 13.1 03/24/2015 1524   LYMPHSABS 3.1 01/07/2014 1402   MONOABS 0.6 01/07/2014 1402   EOSABS 0.1 01/07/2014 1402   BASOSABS 0.0 01/07/2014 1402    Hgb A1C Lab Results  Component Value Date   HGBA1C 10.4 (H) 03/28/2016          Assessment & Plan:   Preventative Health Maintenance:  Flu and tetanus UTD Pneumovax today Pap smear and mammogram UTD Colon  screening UTD Encouraged her to consume a balanced diet and exercise regimen Advised her to see an eye doctor and dentist annually Will check CBC, CMET, Lipid, HIV and Hep C today  RTC in 3 months for follow up of DM 2 Naileah Karg, NP

## 2016-07-05 NOTE — Patient Instructions (Signed)
Health Maintenance, Female Adopting a healthy lifestyle and getting preventive care can go a long way to promote health and wellness. Talk with your health care provider about what schedule of regular examinations is right for you. This is a good chance for you to check in with your provider about disease prevention and staying healthy. In between checkups, there are plenty of things you can do on your own. Experts have done a lot of research about which lifestyle changes and preventive measures are most likely to keep you healthy. Ask your health care provider for more information. Weight and diet Eat a healthy diet  Be sure to include plenty of vegetables, fruits, low-fat dairy products, and lean protein.  Do not eat a lot of foods high in solid fats, added sugars, or salt.  Get regular exercise. This is one of the most important things you can do for your health.  Most adults should exercise for at least 150 minutes each week. The exercise should increase your heart rate and make you sweat (moderate-intensity exercise).  Most adults should also do strengthening exercises at least twice a week. This is in addition to the moderate-intensity exercise. Maintain a healthy weight  Body mass index (BMI) is a measurement that can be used to identify possible weight problems. It estimates body fat based on height and weight. Your health care provider can help determine your BMI and help you achieve or maintain a healthy weight.  For females 76 years of age and older:  A BMI below 18.5 is considered underweight.  A BMI of 18.5 to 24.9 is normal.  A BMI of 25 to 29.9 is considered overweight.  A BMI of 30 and above is considered obese. Watch levels of cholesterol and blood lipids  You should start having your blood tested for lipids and cholesterol at 60 years of age, then have this test every 5 years.  You may need to have your cholesterol levels checked more often if:  Your lipid or  cholesterol levels are high.  You are older than 59 years of age.  You are at high risk for heart disease. Cancer screening Lung Cancer  Lung cancer screening is recommended for adults 64-42 years old who are at high risk for lung cancer because of a history of smoking.  A yearly low-dose CT scan of the lungs is recommended for people who:  Currently smoke.  Have quit within the past 15 years.  Have at least a 30-pack-year history of smoking. A pack year is smoking an average of one pack of cigarettes a day for 1 year.  Yearly screening should continue until it has been 15 years since you quit.  Yearly screening should stop if you develop a health problem that would prevent you from having lung cancer treatment. Breast Cancer  Practice breast self-awareness. This means understanding how your breasts normally appear and feel.  It also means doing regular breast self-exams. Let your health care provider know about any changes, no matter how small.  If you are in your 20s or 30s, you should have a clinical breast exam (CBE) by a health care provider every 1-3 years as part of a regular health exam.  If you are 34 or older, have a CBE every year. Also consider having a breast X-ray (mammogram) every year.  If you have a family history of breast cancer, talk to your health care provider about genetic screening.  If you are at high risk for breast cancer, talk  to your health care provider about having an MRI and a mammogram every year.  Breast cancer gene (BRCA) assessment is recommended for women who have family members with BRCA-related cancers. BRCA-related cancers include:  Breast.  Ovarian.  Tubal.  Peritoneal cancers.  Results of the assessment will determine the need for genetic counseling and BRCA1 and BRCA2 testing. Cervical Cancer  Your health care provider may recommend that you be screened regularly for cancer of the pelvic organs (ovaries, uterus, and vagina).  This screening involves a pelvic examination, including checking for microscopic changes to the surface of your cervix (Pap test). You may be encouraged to have this screening done every 3 years, beginning at age 24.  For women ages 66-65, health care providers may recommend pelvic exams and Pap testing every 3 years, or they may recommend the Pap and pelvic exam, combined with testing for human papilloma virus (HPV), every 5 years. Some types of HPV increase your risk of cervical cancer. Testing for HPV may also be done on women of any age with unclear Pap test results.  Other health care providers may not recommend any screening for nonpregnant women who are considered low risk for pelvic cancer and who do not have symptoms. Ask your health care provider if a screening pelvic exam is right for you.  If you have had past treatment for cervical cancer or a condition that could lead to cancer, you need Pap tests and screening for cancer for at least 20 years after your treatment. If Pap tests have been discontinued, your risk factors (such as having a new sexual partner) need to be reassessed to determine if screening should resume. Some women have medical problems that increase the chance of getting cervical cancer. In these cases, your health care provider may recommend more frequent screening and Pap tests. Colorectal Cancer  This type of cancer can be detected and often prevented.  Routine colorectal cancer screening usually begins at 59 years of age and continues through 59 years of age.  Your health care provider may recommend screening at an earlier age if you have risk factors for colon cancer.  Your health care provider may also recommend using home test kits to check for hidden blood in the stool.  A small camera at the end of a tube can be used to examine your colon directly (sigmoidoscopy or colonoscopy). This is done to check for the earliest forms of colorectal cancer.  Routine  screening usually begins at age 41.  Direct examination of the colon should be repeated every 5-10 years through 59 years of age. However, you may need to be screened more often if early forms of precancerous polyps or small growths are found. Skin Cancer  Check your skin from head to toe regularly.  Tell your health care provider about any new moles or changes in moles, especially if there is a change in a mole's shape or color.  Also tell your health care provider if you have a mole that is larger than the size of a pencil eraser.  Always use sunscreen. Apply sunscreen liberally and repeatedly throughout the day.  Protect yourself by wearing long sleeves, pants, a wide-brimmed hat, and sunglasses whenever you are outside. Heart disease, diabetes, and high blood pressure  High blood pressure causes heart disease and increases the risk of stroke. High blood pressure is more likely to develop in:  People who have blood pressure in the high end of the normal range (130-139/85-89 mm Hg).  People who are overweight or obese.  People who are African American.  If you are 59-24 years of age, have your blood pressure checked every 3-5 years. If you are 34 years of age or older, have your blood pressure checked every year. You should have your blood pressure measured twice-once when you are at a hospital or clinic, and once when you are not at a hospital or clinic. Record the average of the two measurements. To check your blood pressure when you are not at a hospital or clinic, you can use:  An automated blood pressure machine at a pharmacy.  A home blood pressure monitor.  If you are between 29 years and 60 years old, ask your health care provider if you should take aspirin to prevent strokes.  Have regular diabetes screenings. This involves taking a blood sample to check your fasting blood sugar level.  If you are at a normal weight and have a low risk for diabetes, have this test once  every three years after 59 years of age.  If you are overweight and have a high risk for diabetes, consider being tested at a younger age or more often. Preventing infection Hepatitis B  If you have a higher risk for hepatitis B, you should be screened for this virus. You are considered at high risk for hepatitis B if:  You were born in a country where hepatitis B is common. Ask your health care provider which countries are considered high risk.  Your parents were born in a high-risk country, and you have not been immunized against hepatitis B (hepatitis B vaccine).  You have HIV or AIDS.  You use needles to inject street drugs.  You live with someone who has hepatitis B.  You have had sex with someone who has hepatitis B.  You get hemodialysis treatment.  You take certain medicines for conditions, including cancer, organ transplantation, and autoimmune conditions. Hepatitis C  Blood testing is recommended for:  Everyone born from [birth year] through 1963.  Anyone with known risk factors for hepatitis C. Sexually transmitted infections (STIs)  You should be screened for sexually transmitted infections (STIs) including gonorrhea and chlamydia if:  You are sexually active and are younger than 59 years of age.  You are older than 59 years of age and your health care provider tells you that you are at risk for this type of infection.  Your sexual activity has changed since you were last screened and you are at an increased risk for chlamydia or gonorrhea. Ask your health care provider if you are at risk.  If you do not have HIV, but are at risk, it may be recommended that you take a prescription medicine daily to prevent HIV infection. This is called pre-exposure prophylaxis (PrEP). You are considered at risk if:  You are sexually active and do not regularly use condoms or know the HIV status of your partner(s).  You take drugs by injection.  You are sexually active with a partner  who has HIV. Talk with your health care provider about whether you are at high risk of being infected with HIV. If you choose to begin PrEP, you should first be tested for HIV. You should then be tested every 3 months for as long as you are taking PrEP. Pregnancy  If you are premenopausal and you may become pregnant, ask your health care provider about preconception counseling.  If you may become pregnant, take 400 to 800 micrograms (mcg) of folic acid  every day.  If you want to prevent pregnancy, talk to your health care provider about birth control (contraception). Osteoporosis and menopause  Osteoporosis is a disease in which the bones lose minerals and strength with aging. This can result in serious bone fractures. Your risk for osteoporosis can be identified using a bone density scan.  If you are 4 years of age or older, or if you are at risk for osteoporosis and fractures, ask your health care provider if you should be screened.  Ask your health care provider whether you should take a calcium or vitamin D supplement to lower your risk for osteoporosis.  Menopause may have certain physical symptoms and risks.  Hormone replacement therapy may reduce some of these symptoms and risks. Talk to your health care provider about whether hormone replacement therapy is right for you. Follow these instructions at home:  Schedule regular health, dental, and eye exams.  Stay current with your immunizations.  Do not use any tobacco products including cigarettes, chewing tobacco, or electronic cigarettes.  If you are pregnant, do not drink alcohol.  If you are breastfeeding, limit how much and how often you drink alcohol.  Limit alcohol intake to no more than 1 drink per day for nonpregnant women. One drink equals 12 ounces of beer, 5 ounces of wine, or 1 ounces of hard liquor.  Do not use street drugs.  Do not share needles.  Ask your health care provider for help if you need support  or information about quitting drugs.  Tell your health care provider if you often feel depressed.  Tell your health care provider if you have ever been abused or do not feel safe at home. This information is not intended to replace advice given to you by your health care provider. Make sure you discuss any questions you have with your health care provider. Document Released: 11/06/2010 Document Revised: 09/29/2015 Document Reviewed: 01/25/2015 Elsevier Interactive Patient Education  2017 [employer].

## 2016-07-05 NOTE — Addendum Note (Signed)
Addended by: Lurlean Nanny on: 07/05/2016 11:51 AM   Modules accepted: Orders

## 2016-07-06 LAB — HIV ANTIBODY (ROUTINE TESTING W REFLEX): HIV 1&2 Ab, 4th Generation: NONREACTIVE

## 2016-07-06 LAB — HEPATITIS C ANTIBODY: HCV AB: NEGATIVE

## 2016-07-08 ENCOUNTER — Other Ambulatory Visit: Payer: Self-pay | Admitting: Internal Medicine

## 2016-07-08 DIAGNOSIS — E785 Hyperlipidemia, unspecified: Secondary | ICD-10-CM

## 2016-07-10 NOTE — Addendum Note (Signed)
Addended by: Jearld Fenton on: 07/10/2016 09:13 AM   Modules accepted: Orders

## 2016-07-13 ENCOUNTER — Ambulatory Visit: Payer: Commercial Managed Care - HMO | Admitting: Physical Therapy

## 2016-07-18 DIAGNOSIS — Z1231 Encounter for screening mammogram for malignant neoplasm of breast: Secondary | ICD-10-CM | POA: Diagnosis not present

## 2016-07-18 LAB — HM MAMMOGRAPHY

## 2016-07-20 ENCOUNTER — Encounter: Payer: Commercial Managed Care - HMO | Admitting: Physical Therapy

## 2016-07-25 ENCOUNTER — Encounter: Payer: Self-pay | Admitting: Internal Medicine

## 2016-07-30 ENCOUNTER — Other Ambulatory Visit: Payer: Self-pay | Admitting: Internal Medicine

## 2016-07-31 ENCOUNTER — Other Ambulatory Visit: Payer: Self-pay | Admitting: Internal Medicine

## 2016-07-31 DIAGNOSIS — E1165 Type 2 diabetes mellitus with hyperglycemia: Secondary | ICD-10-CM

## 2016-07-31 DIAGNOSIS — Z794 Long term (current) use of insulin: Principal | ICD-10-CM

## 2016-08-16 ENCOUNTER — Encounter: Payer: Self-pay | Admitting: Internal Medicine

## 2016-08-16 ENCOUNTER — Ambulatory Visit (INDEPENDENT_AMBULATORY_CARE_PROVIDER_SITE_OTHER): Payer: Commercial Managed Care - HMO | Admitting: Internal Medicine

## 2016-08-16 VITALS — BP 100/68 | HR 88 | Ht 66.5 in | Wt 195.0 lb

## 2016-08-16 DIAGNOSIS — E1165 Type 2 diabetes mellitus with hyperglycemia: Secondary | ICD-10-CM

## 2016-08-16 DIAGNOSIS — Z794 Long term (current) use of insulin: Secondary | ICD-10-CM

## 2016-08-16 MED ORDER — INSULIN GLARGINE 300 UNIT/ML ~~LOC~~ SOPN: 40.0000 [IU] | PEN_INJECTOR | Freq: Every day | SUBCUTANEOUS | 3 refills | Status: DC

## 2016-08-16 MED ORDER — CANAGLIFLOZIN 100 MG PO TABS: 100.0000 mg | ORAL_TABLET | Freq: Every day | ORAL | 5 refills | Status: DC

## 2016-08-16 NOTE — Progress Notes (Signed)
Patient ID: [name], female   DOB: November 16, 1955, 61 y.o.   MRN: [id number]   HPI: [name] is a 61 y.o.-year-old female, referred by her PCP, Garnette Gunner, [name], NP, for management of DM2, dx in 2013, insulin-dependent since 2016, uncontrolled, without long term complications.  Last hemoglobin A1c was: Lab Results  Component Value Date   HGBA1C 9.8 (H) 07/05/2016   HGBA1C 10.4 (H) 03/28/2016   HGBA1C 8.4 (H) 12/27/2015   Pt is on a regimen of: - Metformin 1000 mg 2x a day, with meals - Lantus 25 units 2x a day - Glipizide 10 mg 2x a day - Januvia 100 mg daily at night  Pt checks her sugars 2x a day and they are: - am: 130-160 - 2h after b'fast: n/c - before lunch: n/c - 2h after lunch: n/c - before dinner: n/c - 2h after dinner: 200-250 - bedtime: n/c - nighttime: n/c No lows. Lowest sugar was 114;? if she has hypoglycemia awareness: "I never went <100". Highest sugar was 280.  Glucometer: OneTouch Ultra  Pt's meals are: - Breakfast: grits, meat or cereals - Lunch: sandwich - Dinner: 2-3x a week eats out: chicken, steak, ribs - Snacks: 2 No sodas; water + crystal light She has nausea + vertigo since Halloween. On Meclizine.   - no CKD, last BUN/creatinine:  Lab Results  Component Value Date   BUN 17 07/05/2016   BUN 16 12/27/2015   CREATININE 0.87 07/05/2016   CREATININE 0.80 12/27/2015   - last set of lipids: Lab Results  Component Value Date   CHOL 195 07/05/2016   HDL 56.40 07/05/2016   LDLCALC 96 03/24/2015   LDLDIRECT 99.0 07/05/2016   TRIG 293.0 (H) 07/05/2016   CHOLHDL 3 07/05/2016  On Fish oil, Livalo, Fenofibrate. - last eye exam was in 08/2015. No DR.  - no numbness and tingling in her feet.  Pt has FH of DM in mother.  ROS: Constitutional: + weight gain, no fatigue, + subjective hyperthermia (hot flushes), + poor sleep Eyes: no blurry vision, no xerophthalmia ENT: no sore throat, no nodules palpated in throat, no dysphagia/odynophagia, no  hoarseness, + decreased hearing Cardiovascular: no CP/SOB/palpitations/leg swelling Respiratory: no cough/SOB Gastrointestinal: no N/V/D/C Musculoskeletal: no muscle/joint aches Skin: no rashes Neurological: no tremors/numbness/tingling/dizziness Psychiatric: no depression/anxiety + low libido  Past Medical History:  Diagnosis Date  . Blood transfusion    1984   [place]  . Chronic kidney disease    occasional renal stone  . Complication of anesthesia    woke during surgery (ablation) 2006?  . Diabetes mellitus    type 2 Diab- A1C-7  . GERD (gastroesophageal reflux disease)    occasional Protonix-will take protonix night before surgery  . Hypercholesteremia    on meds   Past Surgical History:  Procedure Laterality Date  . BLADDER SUSPENSION  12/08/2010   Procedure: TRANSVAGINAL TAPE (TVT) PROCEDURE;  Surgeon: Arloa Koh;  Location: [facility];  Service: Gynecology;  Laterality: N/A;  transvaginal tape with cystoscopy  . BLADDER SUSPENSION  01/30/2011   Procedure: TRANSVAGINAL TAPE (TVT) PROCEDURE;  Surgeon: Arloa Koh;  Location: [facility];  Service: Gynecology;  Laterality: N/A;  excision of excess sling from suprapubic incision and cystoscopy  . [place] [facility]ERY  2004  . CYSTOSCOPY  01/30/2011   Procedure: CYSTOSCOPY;  Surgeon: Arloa Koh;  Location: [facility];  Service: Gynecology;  Laterality: N/A;  . TONSILLECTOMY  1976  . transvaginal tape  8/12  . uterine  ablation     Social History   Social History  . Marital status: Married    Spouse name: N/A  . Number of children: 2   Occupational History  . retired   Social History Main Topics  . Smoking status: Former Smoker    Packs/day: 0.75    Years: 30.00    Types: Cigarettes  . Smokeless tobacco: Never Used     Comment: recently quit 03/01/2015  . Alcohol use No  . Drug use: No   Current Outpatient Prescriptions on File Prior to Visit  Medication Sig Dispense Refill  . B-D UF III MINI PEN NEEDLES 31G X 5 MM  MISC 2 (two) times daily. as directed  6  . fenofibrate (TRICOR) 145 MG tablet Take 1 tablet (145 mg total) by mouth daily. 30 tablet 5  . glipiZIDE (GLUCOTROL) 10 MG tablet TAKE 1 TABLET (10 MG TOTAL) BY MOUTH 2 (TWO) TIMES DAILY BEFORE A MEAL. 60 tablet 1  . glucose blood (FREESTYLE LITE) test strip Use as instructed to test blood sugar once daily E11.9 100 each 12  . Insulin Glargine (LANTUS) 100 UNIT/ML Solostar Pen Inject 45 Units into the skin daily at 10 pm.     . JANUVIA 100 MG tablet TAKE 1 TABLET (100 MG TOTAL) BY MOUTH DAILY. 30 tablet 1  . LIVALO 2 MG TABS TAKE 1 TABLET (2 MG TOTAL) BY MOUTH DAILY. 30 tablet 5  . meclizine (ANTIVERT) 25 MG tablet Take 1 tablet (25 mg total) by mouth 3 (three) times daily as needed for dizziness. 30 tablet 0  . metFORMIN (GLUCOPHAGE) 1000 MG tablet Take 1 tablet (1,000 mg total) by mouth 2 (two) times daily with a meal. 180 tablet 1  . Multiple Vitamins-Minerals (MULTIVITAMINS THER. W/MINERALS) TABS Take 1 tablet by mouth daily.      . Omega-3 Fatty Acids (FISH OIL) 500 MG CAPS Take 500 mg by mouth daily.    Marland Kitchen OVER THE COUNTER MEDICATION Take 1 tablet by mouth 2 (two) times daily. PATIENT TAKES CINNAMON TABLETS 1069m    . pantoprazole (PROTONIX) 40 MG tablet Take 1 tablet by mouth daily.  3   No current facility-administered medications on file prior to visit.    Allergies  Allergen Reactions  . Invokana [Canagliflozin]     Skin peelin  . Jardiance [Empagliflozin]     Myalgias   Family History  Problem Relation Age of Onset  . Hypertension Mother   . Hyperlipidemia Mother   . Heart disease Mother   . Heart disease Father   . Hyperlipidemia Father   . Diabetes Father   . Heart attack Father   . Varicose Veins Father   . Peripheral vascular disease Father     amputation  . Cancer Paternal Aunt     lung/ brain    PE: BP 100/68 (BP Location: Left Arm, Patient Position: Sitting)   Pulse 88   Ht 5' 6.5" (1.689 m)   Wt 195 lb (88.5 kg)    SpO2 95%   BMI 31.00 kg/m  Wt Readings from Last 3 Encounters:  08/16/16 195 lb (88.5 kg)  07/05/16 194 lb 8 oz (88.2 kg)  03/15/16 192 lb 8 oz (87.3 kg)   Constitutional: overweight, in NAD Eyes: PERRLA, EOMI, no exophthalmos ENT: moist mucous membranes, no thyromegaly, no cervical lymphadenopathy Cardiovascular: RRR, No MRG Respiratory: CTA B Gastrointestinal: abdomen soft, NT, ND, BS+ Musculoskeletal: no deformities, strength intact in all 4 Skin: moist, warm, no rashes Neurological:  no tremor with outstretched hands, DTR normal in all 4  ASSESSMENT: 1. DM2, insulin-dependent, uncontrolled, without long term complications, but with hyperglycemia  PLAN:  1. Patient with long-standing, uncontrolled diabetes, on oral antidiabetic regimen + basal insulin, which became insufficient. She had igh sugars in am but they increase towards the end of the day >> she needs more help with her meals >> Will move Januvia in am and start Invokana. She had mm cramps from Invokana before but I suspect this was from dehydration >> she is now drinking more water >> will retry (start at a lower dose). To reduce the nr of injections >> will change from lantus to Toujeo. - we also discussed about improving her diet and I gave her several suggestions - I suggested to:  Patient Instructions  Please continue: - Glipizide 10 mg before meals - Metformin 1000 mg 2x a day, with meals  Change Lantus to Toujeo and use 40 units at bedtime.  Please move Januvia 100 mg before b'fast.  Please start Invokana 50 mg daily before b'fast and increase to 100 mg as tolerated after 1 week.  Please return in 1.5 months with your sugar log.   Please let me know if the sugars are consistently <80 or >200.  - continue checking sugars at different times of the day - check 2 times a day, rotating checks - given sugar log and advised how to fill it and to bring it at next appt  - given foot care handout and explained the  principles  - given instructions for hypoglycemia management "15-15 rule"  - advised for yearly eye exams  - Return to clinic in 1.5 mo with sugar log   Philemon Kingdom, MD PhD [facility] Endocrinology

## 2016-08-16 NOTE — Patient Instructions (Addendum)
Please continue: - Glipizide 10 mg before meals - Metformin 1000 mg 2x a day, with meals  Change Lantus to Toujeo and use 40 units at bedtime.  Please move Januvia 100 mg before b'fast.  Please start Invokana 50 mg daily before b'fast and increase to 100 mg as tolerated after 1 week.  Please return in 1.5 months with your sugar log.   Please let me know if the sugars are consistently <80 or >200.  PATIENT INSTRUCTIONS FOR TYPE 2 DIABETES:  **Please join MyChart!** - see attached instructions about how to join if you have not done so already.  DIET AND EXERCISE Diet and exercise is an important part of diabetic treatment.  We recommended aerobic exercise in the form of brisk walking (working between 40-60% of maximal aerobic capacity, similar to brisk walking) for 150 minutes per week (such as 30 minutes five days per week) along with 3 times per week performing 'resistance' training (using various gauge rubber tubes with handles) 5-10 exercises involving the major muscle groups (upper body, lower body and core) performing 10-15 repetitions (or near fatigue) each exercise. Start at half the above goal but build slowly to reach the above goals. If limited by weight, joint pain, or disability, we recommend daily walking in a swimming pool with water up to waist to reduce pressure from joints while allow for adequate exercise.    BLOOD GLUCOSES Monitoring your blood glucoses is important for continued management of your diabetes. Please check your blood glucoses 2-4 times a day: fasting, before meals and at bedtime (you can rotate these measurements - e.g. one day check before the 3 meals, the next day check before 2 of the meals and before bedtime, etc.).   HYPOGLYCEMIA (low blood sugar) Hypoglycemia is usually a reaction to not eating, exercising, or taking too much insulin/ other diabetes drugs.  Symptoms include tremors, sweating, hunger, confusion, headache, etc. Treat IMMEDIATELY with  15 grams of Carbs: . 4 glucose tablets .  cup regular juice/soda . 2 tablespoons raisins . 4 teaspoons sugar . 1 tablespoon honey Recheck blood glucose in 15 mins and repeat above if still symptomatic/blood glucose <100.  RECOMMENDATIONS TO REDUCE YOUR RISK OF DIABETIC COMPLICATIONS: * Take your prescribed MEDICATION(S) * Follow a DIABETIC diet: Complex carbs, fiber rich foods, (monounsaturated and polyunsaturated) fats * AVOID saturated/trans fats, high fat foods, >2,300 mg salt per day. * EXERCISE at least 5 times a week for 30 minutes or preferably daily.  * DO NOT SMOKE OR DRINK more than 1 drink a day. * Check your FEET every day. Do not wear tightfitting shoes. Contact us if you develop an ulcer * See your EYE doctor once a year or more if needed * Get a FLU shot once a year * Get a PNEUMONIA vaccine once before and once after age 75 years  GOALS:  * Your Hemoglobin A1c of <7%  * fasting sugars need to be <130 * after meals sugars need to be <180 (2h after you start eating) * Your Systolic BP should be 614 or lower  * Your Diastolic BP should be 80 or lower  * Your HDL (Good Cholesterol) should be 40 or higher  * Your LDL (Bad Cholesterol) should be 100 or lower. * Your Triglycerides should be 150 or lower  * Your Urine microalbumin (kidney function) should be <30 * Your Body Mass Index should be 25 or lower    Please consider the following ways to cut down carbs and  fat and increase fiber and micronutrients in your diet: - substitute whole grain for white bread or pasta - substitute brown rice for white rice - substitute 90-calorie flat bread pieces for slices of bread when possible - substitute sweet potatoes or yams for white potatoes - substitute humus for margarine - substitute tofu for cheese when possible - substitute almond or rice milk for regular milk (would not drink soy milk daily due to concern for soy estrogen influence on breast cancer risk) - substitute  dark chocolate for other sweets when possible - substitute water - can add lemon or orange slices for taste - for diet sodas (artificial sweeteners will trick your body that you can eat sweets without getting calories and will lead you to overeating and weight gain in the long run) - do not skip breakfast or other meals (this will slow down the metabolism and will result in more weight gain over time)  - can try smoothies made from fruit and almond/rice milk in am instead of regular breakfast - can also try old-fashioned (not instant) oatmeal made with almond/rice milk in am - order the dressing on the side when eating salad at a restaurant (pour less than half of the dressing on the salad) - eat as little meat as possible - can try juicing, but should not forget that juicing will get rid of the fiber, so would alternate with eating raw veg./fruits or drinking smoothies - use as little oil as possible, even when using olive oil - can dress a salad with a mix of balsamic vinegar and lemon juice, for e.g. - use agave nectar, stevia sugar, or regular sugar rather than artificial sweateners - steam or broil/roast veggies  - snack on veggies/fruit/nuts (unsalted, preferably) when possible, rather than processed foods - reduce or eliminate aspartame in diet (it is in diet sodas, chewing gum, etc) Read the labels!  Try to read Dr. Janene Harvey book: "Program for Reversing Diabetes" for other ideas for healthy eating.

## 2016-08-27 DIAGNOSIS — E119 Type 2 diabetes mellitus without complications: Secondary | ICD-10-CM | POA: Diagnosis not present

## 2016-08-28 LAB — HM DIABETES EYE EXAM

## 2016-09-05 ENCOUNTER — Other Ambulatory Visit: Payer: Self-pay | Admitting: Internal Medicine

## 2016-09-05 DIAGNOSIS — E785 Hyperlipidemia, unspecified: Secondary | ICD-10-CM

## 2016-09-05 NOTE — Telephone Encounter (Signed)
Not if pt saw Dr. Cruzita Lederer

## 2016-09-06 ENCOUNTER — Other Ambulatory Visit: Payer: Self-pay | Admitting: Internal Medicine

## 2016-09-22 ENCOUNTER — Other Ambulatory Visit: Payer: Self-pay | Admitting: Internal Medicine

## 2016-09-24 NOTE — Telephone Encounter (Signed)
Pt is now being followed by you... Last OV 08/16/16... Please advise

## 2016-09-27 ENCOUNTER — Ambulatory Visit (INDEPENDENT_AMBULATORY_CARE_PROVIDER_SITE_OTHER): Payer: Commercial Managed Care - HMO | Admitting: Internal Medicine

## 2016-09-27 ENCOUNTER — Encounter: Payer: Self-pay | Admitting: Internal Medicine

## 2016-09-27 VITALS — BP 112/74 | HR 83 | Wt 192.0 lb

## 2016-09-27 DIAGNOSIS — Z794 Long term (current) use of insulin: Secondary | ICD-10-CM

## 2016-09-27 DIAGNOSIS — E1165 Type 2 diabetes mellitus with hyperglycemia: Secondary | ICD-10-CM

## 2016-09-27 LAB — BASIC METABOLIC PANEL WITH GFR
BUN: 18 mg/dL (ref 7–25)
CO2: 24 mmol/L (ref 20–31)
Calcium: 9.4 mg/dL (ref 8.6–10.4)
Chloride: 104 mmol/L (ref 98–110)
Creat: 0.85 mg/dL (ref 0.50–1.05)
GFR, Est African American: 87 mL/min (ref >=60)
GFR, Est Non African American: 76 mL/min (ref >=60)
Glucose, Bld: 175 mg/dL — ABNORMAL HIGH (ref 65–99)
Potassium: 4.6 mmol/L (ref 3.5–5.3)
SODIUM: 139 mmol/L (ref 135–146)

## 2016-09-27 LAB — POCT GLYCOSYLATED HEMOGLOBIN (HGB A1C): Hemoglobin A1C: 8.9

## 2016-09-27 NOTE — Progress Notes (Signed)
Patient ID: [name], female   DOB: [birth year], 61 y.o.   MRN: [id number]   HPI: [name] is a 61 y.o.-year-old female, returning for f/u for DM2, dx in 2013, insulin-dependent since 2016, uncontrolled, without long term complications. Last visit 2 mo ago.  She just returned from a trip to [facility].  Last hemoglobin A1c was: Lab Results  Component Value Date   HGBA1C 9.8 (H) 07/05/2016   HGBA1C 10.4 (H) 03/28/2016   HGBA1C 8.4 (H) 12/27/2015   Pt is on a regimen of: - Glipizide 10 mg before meals - Metformin 1000 mg 2x a day, with meals - Januvia 100 mg before b'fast - moved to b'fast at last visit - Invokana 50 mg before b'fast - added at last visit - Toujeo 40 units at bedtime. - changed from Lantus at last visit  Pt checks her sugars 2x  a day - better: - am: 130-160 >> 126-175, 186 - 2h after b'fast: n/c >> 147, 164 - before lunch: n/c >> 160 - 2h after lunch: n/c >> 151 - before dinner: n/c >> 140, 142 - 2h after dinner: 200-250 >>> 131-163, 205 - bedtime: n/c >> 179  - nighttime: n/c No lows. Lowest sugar was 114 >> 126;? if she has hypoglycemia awareness: "i never went <100" Highest sugar was 280 >> 205.  Glucometer: OneTouch Ultra  Pt's meals are: - Breakfast: grits, meat or cereals - Lunch: sandwich - Dinner: 2-3x a week eats out: chicken, steak, ribs - Snacks: 2 No sodas; water + crystal light. Drinks almost 1 gallon a day.  - No CKD, last BUN/creatinine:  Lab Results  Component Value Date   BUN 17 07/05/2016   BUN 16 12/27/2015   CREATININE 0.87 07/05/2016   CREATININE 0.80 12/27/2015   - last set of lipids: Lab Results  Component Value Date   CHOL 195 07/05/2016   HDL 56.40 07/05/2016   LDLCALC 96 03/24/2015   LDLDIRECT 99.0 07/05/2016   TRIG 293.0 (H) 07/05/2016   CHOLHDL 3 07/05/2016  On Fish oil, Livalo, Fenofibrate. - last eye exam was in 08/2016. No DR.  - she denies numbness and tingling in her feet.  Pt has FH of DM in  mother.  ROS: Constitutional:+ mild weight loss, no fatigue, no subjective hyperthermia, no subjective hypothermia Eyes: no blurry vision, no xerophthalmia ENT: no sore throat, no nodules palpated in throat, no dysphagia, no odynophagia, no hoarseness Cardiovascular: no CP/no SOB/no palpitations/no leg swelling Respiratory: no cough/no SOB/no wheezing Gastrointestinal: no N/no V/no D/no C/no acid reflux Musculoskeletal: no muscle aches/no joint aches Skin: no rashes, no hair loss Neurological: no tremors/no numbness/no tingling/no dizziness  I reviewed pt's medications, allergies, PMH, social hx, family hx, and changes were documented in the history of present illness. Otherwise, unchanged from my initial visit note.  Past Medical History:  Diagnosis Date  . Blood transfusion    1984   [place]  . Chronic kidney disease    occasional renal stone  . Complication of anesthesia    woke during surgery (ablation) 2006?  . Diabetes mellitus    type 2 Diab- A1C-7  . GERD (gastroesophageal reflux disease)    occasional Protonix-will take protonix night before surgery  . Hypercholesteremia    on meds   Past Surgical History:  Procedure Laterality Date  . BLADDER SUSPENSION  12/08/2010   Procedure: TRANSVAGINAL TAPE (TVT) PROCEDURE;  Surgeon: Arloa Koh;  Location: [facility];  Service: Gynecology;  Laterality: N/A;  transvaginal tape with cystoscopy  . BLADDER SUSPENSION  01/30/2011   Procedure: TRANSVAGINAL TAPE (TVT) PROCEDURE;  Surgeon: Arloa Koh;  Location: [facility];  Service: Gynecology;  Laterality: N/A;  excision of excess sling from suprapubic incision and cystoscopy  . [place] SURGERY  2004  . CYSTOSCOPY  01/30/2011   Procedure: CYSTOSCOPY;  Surgeon: Arloa Koh;  Location: [facility];  Service: Gynecology;  Laterality: N/A;  . TONSILLECTOMY  1976  . transvaginal tape  8/12  . uterine ablation     Social History   Social History  . Marital status: Married    Spouse name:  N/A  . Number of children: 2   Occupational History  . retired   Social History Main Topics  . Smoking status: Former Smoker    Packs/day: 0.75    Years: 30.00    Types: Cigarettes  . Smokeless tobacco: Never Used     Comment: recently quit 03/01/2015  . Alcohol use No  . Drug use: No   Current Outpatient Prescriptions on File Prior to Visit  Medication Sig Dispense Refill  . B-D UF III MINI PEN NEEDLES 31G X 5 MM MISC 2 (two) times daily. as directed  6  . Calcium Carbonate-Vitamin D (CALTRATE 600+D PO) Take by mouth.    . canagliflozin (INVOKANA) 100 MG TABS tablet Take 1 tablet (100 mg total) by mouth daily. 30 tablet 5  . fenofibrate (TRICOR) 145 MG tablet TAKE 1 TABLET (145 MG TOTAL) BY MOUTH DAILY. 30 tablet 5  . glipiZIDE (GLUCOTROL) 10 MG tablet TAKE 1 TABLET (10 MG TOTAL) BY MOUTH 2 (TWO) TIMES DAILY BEFORE A MEAL. 60 tablet 1  . glucose blood (FREESTYLE LITE) test strip Use as instructed to test blood sugar once daily E11.9 100 each 12  . Insulin Glargine (TOUJEO SOLOSTAR) 300 UNIT/ML SOPN Inject 40 Units into the skin at bedtime. 5 pen 3  . JANUVIA 100 MG tablet TAKE 1 TABLET (100 MG TOTAL) BY MOUTH DAILY. 30 tablet 1  . LIVALO 2 MG TABS TAKE 1 TABLET (2 MG TOTAL) BY MOUTH DAILY. 30 tablet 5  . meclizine (ANTIVERT) 25 MG tablet Take 1 tablet (25 mg total) by mouth 3 (three) times daily as needed for dizziness. 30 tablet 0  . metFORMIN (GLUCOPHAGE) 1000 MG tablet Take 1 tablet (1,000 mg total) by mouth 2 (two) times daily with a meal. 180 tablet 1  . Multiple Vitamins-Minerals (MULTIVITAMINS THER. W/MINERALS) TABS Take 1 tablet by mouth daily.      . Omega-3 Fatty Acids (FISH OIL) 500 MG CAPS Take 500 mg by mouth daily.    Marland Kitchen OVER THE COUNTER MEDICATION Take 1 tablet by mouth 2 (two) times daily. PATIENT TAKES CINNAMON TABLETS 1000mg     . OVER THE COUNTER MEDICATION     . pantoprazole (PROTONIX) 40 MG tablet Take 1 tablet by mouth daily.  3   No current  facility-administered medications on file prior to visit.    Allergies  Allergen Reactions  . Invokana [Canagliflozin]     Skin peelin  . Jardiance [Empagliflozin]     Myalgias   Family History  Problem Relation Age of Onset  . Hypertension Mother   . Hyperlipidemia Mother   . Heart disease Mother   . Heart disease Father   . Hyperlipidemia Father   . Diabetes Father   . Heart attack Father   . Varicose Veins Father   . Peripheral vascular disease Father  amputation  . Cancer Paternal Aunt        lung/ brain    PE: BP 112/74 (BP Location: Left Arm, Patient Position: Sitting)   Pulse 83   Wt 192 lb (87.1 kg)   SpO2 98%   BMI 30.53 kg/m  Wt Readings from Last 3 Encounters:  09/27/16 192 lb (87.1 kg)  08/16/16 195 lb (88.5 kg)  07/05/16 194 lb 8 oz (88.2 kg)   Constitutional: overweight, in NAD, tanned Eyes: PERRLA, EOMI, no exophthalmos ENT: moist mucous membranes, no thyromegaly, no cervical lymphadenopathy Cardiovascular: RRR, No MRG Respiratory: CTA B Gastrointestinal: abdomen soft, NT, ND, BS+ Musculoskeletal: no deformities, strength intact in all 4 Skin: dry, warm, no rashes Neurological: no tremor with outstretched hands, DTR normal in all 4  ASSESSMENT: 1. DM2, insulin-dependent, uncontrolled, without long term complications, but with hyperglycemia  PLAN:  1. Patient with long-standing, Uncontrolled diabetes, on oral antidiabetic regimen (which we adjusted at last visit) + basal insulin, with improvement in sugars since last visit, after adding back iNVOKANA (no more muscle cramps, which she developed in the past while on this medication) and moving Januvia to before breakfast. Her sugars in the morning are still higher than target, therefore, I advised her to increase iNVOKANA  To 100 mg daily. - We will check her kidney function and potassium level since we started [facility] At last visit - we again  discussed about improving her dietand I made  several suggestions - I suggested to:  Patient Instructions  Please continue: - Glipizide 10 mg before meals - Metformin 1000 mg 2x a day, with meals - Januvia 50 mg before b'fast - Toujeo 40 units at bedtime.  Please stop at the lab.  We may be able to increase Invokana to 100 mg daily after results are back.  Please return in 3 months with your sugar log.  - today, HbA1c is 8.9% (Better) - continue checking sugars at different times of the day - check 2x a day, rotating checks - advised for yearly eye exams >> she is UTD - Return to clinic in 3 mo with sugar log   Office Visit on 09/27/2016  Component Date Value Ref Range Status  . Sodium 09/27/2016 139  135 - 146 mmol/L Final  . Potassium 09/27/2016 4.6  3.5 - 5.3 mmol/L Final  . Chloride 09/27/2016 104  98 - 110 mmol/L Final  . CO2 09/27/2016 24  20 - 31 mmol/L Final  . Glucose, Bld 09/27/2016 175* 65 - 99 mg/dL Final  . BUN 09/27/2016 18  7 - 25 mg/dL Final  . Creat 09/27/2016 0.85  0.50 - 1.05 mg/dL Final   Comment:   For patients > or = 59 years of age: The upper reference limit for Creatinine is approximately 13% higher for people identified as African-American.     . Calcium 09/27/2016 9.4  8.6 - 10.4 mg/dL Final  . GFR, Est African American 09/27/2016 87  >=60 mL/min Final  . GFR, Est Non African American 09/27/2016 76  >=60 mL/min Final  . Hemoglobin A1C 09/27/2016 8.9   Final   Glu is a little high. Potassium and kidney fxn are normal. Continue current med regimen.  Philemon Kingdom, MD PhD [facility] Endocrinology

## 2016-09-27 NOTE — Patient Instructions (Addendum)
Please continue: - Glipizide 10 mg before meals - Metformin 1000 mg 2x a day, with meals - Januvia 50 mg before b'fast - Toujeo 40 units at bedtime.  Please stop at the lab.  We may be able to increase Invokana to 100 mg daily after results are back.  Please return in 3 months with your sugar log.

## 2016-09-28 ENCOUNTER — Telehealth: Payer: Self-pay

## 2016-09-28 NOTE — Telephone Encounter (Signed)
LVM, gave lab results. Gave call back number if any questions or concerns.  

## 2016-09-28 NOTE — Telephone Encounter (Signed)
-----   Message from Philemon Kingdom, MD sent at 09/28/2016  7:24 AM EDT ----- Almyra Free, can you please call pt: Glu is a little high. Potassium and kidney fxn are normal. Continue current med regimen.

## 2016-10-05 ENCOUNTER — Other Ambulatory Visit: Payer: Self-pay

## 2016-10-06 ENCOUNTER — Other Ambulatory Visit: Payer: Self-pay | Admitting: Internal Medicine

## 2016-10-08 ENCOUNTER — Ambulatory Visit: Payer: Commercial Managed Care - HMO | Admitting: Internal Medicine

## 2016-10-21 ENCOUNTER — Other Ambulatory Visit: Payer: Self-pay | Admitting: Internal Medicine

## 2016-10-21 DIAGNOSIS — E1165 Type 2 diabetes mellitus with hyperglycemia: Secondary | ICD-10-CM

## 2016-10-21 DIAGNOSIS — Z794 Long term (current) use of insulin: Principal | ICD-10-CM

## 2016-10-24 ENCOUNTER — Other Ambulatory Visit: Payer: Self-pay

## 2016-10-24 ENCOUNTER — Telehealth: Payer: Self-pay | Admitting: Internal Medicine

## 2016-10-24 ENCOUNTER — Encounter: Payer: Self-pay | Admitting: Physical Therapy

## 2016-10-24 DIAGNOSIS — R42 Dizziness and giddiness: Secondary | ICD-10-CM | POA: Diagnosis not present

## 2016-10-24 DIAGNOSIS — H903 Sensorineural hearing loss, bilateral: Secondary | ICD-10-CM | POA: Diagnosis not present

## 2016-10-24 MED ORDER — BD PEN NEEDLE MINI U/F 31G X 5 MM MISC: 5 refills | Status: DC

## 2016-10-24 NOTE — Therapy (Signed)
[facility] [address] [place], [place], [address] Phone: [phone]   Fax:  [phone]  Patient Details  Name: [name] MRN: [id number] Date of Birth: 1956-05-07 Referring Provider:  No ref. provider found  Encounter Date: 10/24/2016  PHYSICAL THERAPY DISCHARGE SUMMARY  Visits from Start of Care: 2  Current functional level related to goals / functional outcomes: Unable to assess as pt did not return after 2nd visit   Remaining deficits: Dizziness   Education / Equipment: HEP  Plan: Patient agrees to discharge.  Patient goals were not met. Patient is being discharged due to not returning since the last visit.  ?????    Raylene Everts, PT, DPT 10/24/16    1:49 PM     [facility] [address] [place], [place], [address] Phone: [phone]   Fax:  [phone]

## 2016-10-24 NOTE — Telephone Encounter (Signed)
Needs new script for B-D UF III MINI PEN NEEDLES 31G X 5 MM MISC.   [id number]tha [address], [place] - [place]  Thank you,  -L

## 2016-10-24 NOTE — Telephone Encounter (Signed)
Submitted

## 2016-10-29 ENCOUNTER — Other Ambulatory Visit: Payer: Self-pay | Admitting: Internal Medicine

## 2016-10-29 DIAGNOSIS — E1165 Type 2 diabetes mellitus with hyperglycemia: Secondary | ICD-10-CM

## 2016-10-29 DIAGNOSIS — Z794 Long term (current) use of insulin: Principal | ICD-10-CM

## 2016-11-01 ENCOUNTER — Other Ambulatory Visit: Payer: Self-pay

## 2016-11-01 DIAGNOSIS — E1165 Type 2 diabetes mellitus with hyperglycemia: Secondary | ICD-10-CM

## 2016-11-01 DIAGNOSIS — Z794 Long term (current) use of insulin: Principal | ICD-10-CM

## 2016-11-04 ENCOUNTER — Other Ambulatory Visit: Payer: Self-pay | Admitting: Internal Medicine

## 2016-11-05 MED ORDER — GLUCOSE BLOOD VI STRP: 1.0000 | ORAL_STRIP | Freq: Two times a day (BID) | 11 refills | Status: DC | PRN

## 2016-11-05 MED ORDER — METFORMIN HCL 1000 MG PO TABS: 1000.0000 mg | ORAL_TABLET | Freq: Two times a day (BID) | ORAL | 1 refills | Status: DC

## 2016-11-15 ENCOUNTER — Other Ambulatory Visit: Payer: Self-pay

## 2016-11-15 DIAGNOSIS — E1165 Type 2 diabetes mellitus with hyperglycemia: Secondary | ICD-10-CM

## 2016-11-15 DIAGNOSIS — Z794 Long term (current) use of insulin: Principal | ICD-10-CM

## 2016-11-15 NOTE — Telephone Encounter (Signed)
Last OV 09/27/16... Please advise

## 2016-11-20 ENCOUNTER — Other Ambulatory Visit: Payer: Self-pay

## 2016-11-20 ENCOUNTER — Telehealth: Payer: Self-pay | Admitting: *Deleted

## 2016-11-20 DIAGNOSIS — H8120 Vestibular neuronitis, unspecified ear: Secondary | ICD-10-CM | POA: Diagnosis not present

## 2016-11-20 DIAGNOSIS — H8190 Unspecified disorder of vestibular function, unspecified ear: Secondary | ICD-10-CM | POA: Insufficient documentation

## 2016-11-20 DIAGNOSIS — H903 Sensorineural hearing loss, bilateral: Secondary | ICD-10-CM | POA: Diagnosis not present

## 2016-11-20 DIAGNOSIS — E1165 Type 2 diabetes mellitus with hyperglycemia: Secondary | ICD-10-CM

## 2016-11-20 DIAGNOSIS — R42 Dizziness and giddiness: Secondary | ICD-10-CM | POA: Diagnosis not present

## 2016-11-20 DIAGNOSIS — Z794 Long term (current) use of insulin: Principal | ICD-10-CM

## 2016-11-20 MED ORDER — GLIPIZIDE 10 MG PO TABS: 10.0000 mg | ORAL_TABLET | Freq: Two times a day (BID) | ORAL | 1 refills | Status: DC

## 2016-11-20 MED ORDER — SITAGLIPTIN PHOSPHATE 100 MG PO TABS
100.0000 mg | ORAL_TABLET | Freq: Every day | ORAL | 1 refills | Status: DC
Start: 2016-11-20 — End: 2018-10-16

## 2016-11-20 NOTE — Telephone Encounter (Signed)
I have submitted the refill request for those medications. I am unsure of why we have not received those refills previously. They are now under your name so they should come back to [place] now.

## 2016-11-20 NOTE — Telephone Encounter (Signed)
Call and see what is going on. She sees Dr. Cruzita Lederer.

## 2016-11-20 NOTE — Telephone Encounter (Signed)
I have been sending to Dr Renne Crigler and they are not responding

## 2016-11-20 NOTE — Telephone Encounter (Signed)
[name], can you please check what happened to this refill request? I did not see it before. She mentions that she has been asking for the medications for several months, but I saw her less than 2 months ago.Marland KitchenMarland Kitchen

## 2016-11-20 NOTE — Telephone Encounter (Signed)
Patient left a voicemail stating that she has been out of her Glipizide and Januvia for several months and no one has refilled these medications for her. Patient requested a call back.

## 2016-12-08 ENCOUNTER — Other Ambulatory Visit: Payer: Self-pay | Admitting: Internal Medicine

## 2016-12-11 DIAGNOSIS — D225 Melanocytic nevi of trunk: Secondary | ICD-10-CM | POA: Diagnosis not present

## 2016-12-11 DIAGNOSIS — D1801 Hemangioma of skin and subcutaneous tissue: Secondary | ICD-10-CM | POA: Diagnosis not present

## 2016-12-11 DIAGNOSIS — L820 Inflamed seborrheic keratosis: Secondary | ICD-10-CM | POA: Diagnosis not present

## 2016-12-11 DIAGNOSIS — L814 Other melanin hyperpigmentation: Secondary | ICD-10-CM | POA: Diagnosis not present

## 2017-01-10 ENCOUNTER — Ambulatory Visit: Payer: Commercial Managed Care - HMO | Admitting: Internal Medicine

## 2017-01-14 ENCOUNTER — Other Ambulatory Visit: Payer: Self-pay | Admitting: Internal Medicine

## 2017-01-14 DIAGNOSIS — E1165 Type 2 diabetes mellitus with hyperglycemia: Secondary | ICD-10-CM

## 2017-01-14 DIAGNOSIS — E785 Hyperlipidemia, unspecified: Secondary | ICD-10-CM

## 2017-01-14 DIAGNOSIS — Z794 Long term (current) use of insulin: Principal | ICD-10-CM

## 2017-01-16 ENCOUNTER — Encounter: Payer: Self-pay | Admitting: Internal Medicine

## 2017-01-16 ENCOUNTER — Ambulatory Visit (INDEPENDENT_AMBULATORY_CARE_PROVIDER_SITE_OTHER): Payer: Commercial Managed Care - HMO | Admitting: Internal Medicine

## 2017-01-16 VITALS — BP 118/72 | HR 84 | Wt 186.0 lb

## 2017-01-16 DIAGNOSIS — E663 Overweight: Secondary | ICD-10-CM

## 2017-01-16 DIAGNOSIS — E1165 Type 2 diabetes mellitus with hyperglycemia: Secondary | ICD-10-CM

## 2017-01-16 DIAGNOSIS — Z794 Long term (current) use of insulin: Secondary | ICD-10-CM | POA: Diagnosis not present

## 2017-01-16 DIAGNOSIS — Z23 Encounter for immunization: Secondary | ICD-10-CM | POA: Diagnosis not present

## 2017-01-16 LAB — POCT GLYCOSYLATED HEMOGLOBIN (HGB A1C): HEMOGLOBIN A1C: 7.8

## 2017-01-16 NOTE — Patient Instructions (Addendum)
Please continue: - Glipizide 10 mg before meals - Metformin 1000 mg 2x a day, with meals - Januvia 100 mg before b'fast - Invokana 100 mg before b'fast - Toujeo 40 units at bedtime.  Please return in 3 months with your sugar log.

## 2017-01-16 NOTE — Addendum Note (Signed)
Addended by: Caprice Beaver T on: 01/16/2017 11:16 AM   Modules accepted: Orders

## 2017-01-16 NOTE — Addendum Note (Signed)
Addended by: Caprice Beaver T on: 01/16/2017 11:35 AM   Modules accepted: Orders

## 2017-01-16 NOTE — Progress Notes (Signed)
Patient ID: [name], female   DOB: July 21, 1955, 61 y.o.   MRN: [id number]   HPI: [name] is a 61 y.o.-year-old female, returning for f/u for DM2, dx in 2013, insulin-dependent since 2016, uncontrolled, without long term complications. Last visit 4 mo ago.  Last hemoglobin A1c was: Lab Results  Component Value Date   HGBA1C 8.9 09/27/2016   HGBA1C 9.8 (H) 07/05/2016   HGBA1C 10.4 (H) 03/28/2016   Pt is on a regimen of: - Glipizide 10 mg before meals - Januvia 100 mg before b'fast - Metformin 1000 mg 2x a day, with meals - Invokana 50 >> 100 mg before b'fast (drinks 1 gallon of water a day) - Toujeo 40 units at bedtime. - changed from Lantus  Pt checks her sugars 2x a day: - am: 130-160 >> 126-175, 186 >> 105-125 - 2h after b'fast: n/c >> 147, 164 >> n/c - before lunch: n/c >> 160 >> 120-130 - 2h after lunch: n/c >> 151 >> n/c - before dinner: n/c >> 140, 142 >> n/c - 2h after dinner: 200-250 >>> 131-163, 205 >> 150-200 - bedtime: n/c >> 179  - nighttime: n/c No lows. Lowest sugar was 114 >> 126 >> 105;? if she has hypoglycemia awareness: "i never went <100" Highest sugar was 280 >> 205 >> 200.  Glucometer: OneTouch Ultra  Pt's meals are: - Breakfast: grits, meat or cereals - Lunch: sandwich - Dinner: 2-3x a week eats out: chicken, steak, ribs - Snacks: 2 No sodas; water + crystal light.  - No CKD, last BUN/creatinine:  Lab Results  Component Value Date   BUN 18 09/27/2016   BUN 17 07/05/2016   CREATININE 0.85 09/27/2016   CREATININE 0.87 07/05/2016   - last set of lipids: Lab Results  Component Value Date   CHOL 195 07/05/2016   HDL 56.40 07/05/2016   LDLCALC 96 03/24/2015   LDLDIRECT 99.0 07/05/2016   TRIG 293.0 (H) 07/05/2016   CHOLHDL 3 07/05/2016  On Fish oil, Livalo, fenofibrate. - last eye exam was in 08/2016 >> No DR.  - denies numbness and tingling in her feet.  She has Vertigo.  ROS: Constitutional: + weight loss, no fatigue, no  subjective hyperthermia, no subjective hypothermia Eyes: no blurry vision, no xerophthalmia ENT: no sore throat, no nodules palpated in throat, no dysphagia, no odynophagia, no hoarseness Cardiovascular: no CP/no SOB/no palpitations/no leg swelling Respiratory: no cough/no SOB/no wheezing Gastrointestinal: no N/no V/no D/no C/no acid reflux Musculoskeletal: no muscle aches/no joint aches Skin: no rashes, no hair loss Neurological: no tremors/no numbness/no tingling/no dizziness  I reviewed pt's medications, allergies, PMH, social hx, family hx, and changes were documented in the history of present illness. Otherwise, unchanged from my initial visit note.   Past Medical History:  Diagnosis Date  . Blood transfusion    1984   [place]  . Chronic kidney disease    occasional renal stone  . Complication of anesthesia    woke during surgery (ablation) 2006?  . Diabetes mellitus    type 2 Diab- A1C-7  . GERD (gastroesophageal reflux disease)    occasional Protonix-will take protonix night before surgery  . Hypercholesteremia    on meds   Past Surgical History:  Procedure Laterality Date  . BLADDER SUSPENSION  12/08/2010   Procedure: TRANSVAGINAL TAPE (TVT) PROCEDURE;  Surgeon: Arloa Koh;  Location: [facility];  Service: Gynecology;  Laterality: N/A;  transvaginal tape with cystoscopy  . BLADDER SUSPENSION  01/30/2011  Procedure: TRANSVAGINAL TAPE (TVT) PROCEDURE;  Surgeon: Arloa Koh;  Location: [facility];  Service: Gynecology;  Laterality: N/A;  excision of excess sling from suprapubic incision and cystoscopy  . [place] SURGERY  2004  . CYSTOSCOPY  01/30/2011   Procedure: CYSTOSCOPY;  Surgeon: Arloa Koh;  Location: [facility];  Service: Gynecology;  Laterality: N/A;  . TONSILLECTOMY  1976  . transvaginal tape  8/12  . uterine ablation     Social History   Social History  . Marital status: Married    Spouse name: N/A  . Number of children: 2   Occupational History  . retired    Social History Main Topics  . Smoking status: Former Smoker    Packs/day: 0.75    Years: 30.00    Types: Cigarettes  . Smokeless tobacco: Never Used     Comment: recently quit 03/01/2015  . Alcohol use No  . Drug use: No   Current Outpatient Prescriptions on File Prior to Visit  Medication Sig Dispense Refill  . B-D UF III MINI PEN NEEDLES 31G X 5 MM MISC Use to inject insulin daily 100 each 5  . Calcium Carbonate-Vitamin D (CALTRATE 600+D PO) Take by mouth.    . canagliflozin (INVOKANA) 100 MG TABS tablet Take 1 tablet (100 mg total) by mouth daily. 30 tablet 5  . fenofibrate (TRICOR) 145 MG tablet TAKE 1 TABLET (145 MG TOTAL) BY MOUTH DAILY. 30 tablet 5  . glipiZIDE (GLUCOTROL) 10 MG tablet TAKE 1 TABLET (10 MG TOTAL) BY MOUTH 2 (TWO) TIMES DAILY BEFORE A MEAL. 60 tablet 1  . glucose blood (FREESTYLE LITE) test strip 1 each by Other route 2 (two) times daily as needed for other. 100 each 11  . LIVALO 2 MG TABS TAKE 1 TABLET (2 MG TOTAL) BY MOUTH DAILY. 30 tablet 3  . meclizine (ANTIVERT) 25 MG tablet Take 1 tablet (25 mg total) by mouth 3 (three) times daily as needed for dizziness. 30 tablet 0  . metFORMIN (GLUCOPHAGE) 1000 MG tablet Take 1 tablet (1,000 mg total) by mouth 2 (two) times daily with a meal. 180 tablet 1  . Multiple Vitamins-Minerals (MULTIVITAMINS THER. W/MINERALS) TABS Take 1 tablet by mouth daily.      . Omega-3 Fatty Acids (FISH OIL) 500 MG CAPS Take 500 mg by mouth daily.    Marland Kitchen OVER THE COUNTER MEDICATION Take 1 tablet by mouth 2 (two) times daily. PATIENT TAKES CINNAMON TABLETS 1000mg     . OVER THE COUNTER MEDICATION     . pantoprazole (PROTONIX) 40 MG tablet Take 1 tablet by mouth daily.  3  . sitaGLIPtin (JANUVIA) 100 MG tablet Take 1 tablet (100 mg total) by mouth daily. 30 tablet 1  . TOUJEO SOLOSTAR 300 UNIT/ML SOPN INJECT 40 UNITS INTO THE SKIN AT BEDTIME. 5 pen 3   No current facility-administered medications on file prior to visit.    Allergies   Allergen Reactions  . Invokana [Canagliflozin]     Skin peelin  . Jardiance [Empagliflozin]     Myalgias   Family History  Problem Relation Age of Onset  . Hypertension Mother   . Hyperlipidemia Mother   . Heart disease Mother   . Heart disease Father   . Hyperlipidemia Father   . Diabetes Father   . Heart attack Father   . Varicose Veins Father   . Peripheral vascular disease Father        amputation  . Cancer Paternal Aunt  lung/ brain    PE: BP 118/72 (BP Location: Left Arm, Patient Position: Sitting)   Pulse 84   Wt 186 lb (84.4 kg)   SpO2 96%   BMI 29.57 kg/m  Wt Readings from Last 3 Encounters:  01/16/17 186 lb (84.4 kg)  09/27/16 192 lb (87.1 kg)  08/16/16 195 lb (88.5 kg)   Constitutional: overweight, in NAD Eyes: PERRLA, EOMI, no exophthalmos ENT: moist mucous membranes, no thyromegaly, no cervical lymphadenopathy Cardiovascular: RRR, No MRG Respiratory: CTA B Gastrointestinal: abdomen soft, NT, ND, BS+ Musculoskeletal: no deformities, strength intact in all 4 Skin: moist, warm, no rashes Neurological: no tremor with outstretched hands, DTR normal in all 4  ASSESSMENT: 1. DM2, insulin-dependent, uncontrolled, without long term complications, but with hyperglycemia  2. Overweight  PLAN:  1. Patient with long-standing, uncontrolled DM, with continuously improving control on a complex antidiabetic insulin regimen. Since last visit, she was able to increase back Invokana dose to 100 mg in am. She does not experience mm cramps or skin peeling on this. - we again discussed about dietary changes and I made several suggestions to improve dinner as her post-dinner sugars are the highest, up to 200 (usually after eating out) - I suggested to:  Patient Instructions  Please continue: - Glipizide 10 mg before meals - Metformin 1000 mg 2x a day, with meals - Januvia 100 mg before b'fast - Invokana 100 mg before b'fast - Toujeo 40 units at  bedtime.  Please return in 3 months with your sugar log.  - today, HbA1c is 7.8% (better) - continue checking sugars at different times of the day - check 1x a day, rotating checks - advised for yearly eye exams >> she is UTD - given flu shot today - Return to clinic in 3 mo with sugar log   2. Overweight - lost 6 lbs since last visit, despite [place] the [place] - I hope we can reduce the insulin at next visit which will also help with her weight loss  Philemon Kingdom, MD PhD [facility] Endocrinology

## 2017-02-06 ENCOUNTER — Other Ambulatory Visit: Payer: Self-pay | Admitting: Internal Medicine

## 2017-02-22 DIAGNOSIS — Z124 Encounter for screening for malignant neoplasm of cervix: Secondary | ICD-10-CM | POA: Diagnosis not present

## 2017-02-22 DIAGNOSIS — Z01419 Encounter for gynecological examination (general) (routine) without abnormal findings: Secondary | ICD-10-CM | POA: Diagnosis not present

## 2017-02-22 DIAGNOSIS — R42 Dizziness and giddiness: Secondary | ICD-10-CM | POA: Insufficient documentation

## 2017-02-22 LAB — HM PAP SMEAR: HM PAP: NORMAL

## 2017-02-26 ENCOUNTER — Telehealth: Payer: Self-pay | Admitting: *Deleted

## 2017-02-26 ENCOUNTER — Other Ambulatory Visit: Payer: Self-pay

## 2017-02-26 MED ORDER — FENOFIBRATE 145 MG PO TABS: 145.0000 mg | ORAL_TABLET | Freq: Every day | ORAL | 5 refills | Status: DC

## 2017-02-26 NOTE — Telephone Encounter (Signed)
Pt requested refill  Up to date on office visits RX refilled

## 2017-02-26 NOTE — Telephone Encounter (Signed)
Refill fenfibrate

## 2017-02-27 ENCOUNTER — Encounter: Payer: Self-pay | Admitting: Internal Medicine

## 2017-03-11 ENCOUNTER — Other Ambulatory Visit: Payer: Self-pay | Admitting: Internal Medicine

## 2017-03-11 DIAGNOSIS — E1165 Type 2 diabetes mellitus with hyperglycemia: Secondary | ICD-10-CM

## 2017-03-11 DIAGNOSIS — Z794 Long term (current) use of insulin: Principal | ICD-10-CM

## 2017-04-17 ENCOUNTER — Ambulatory Visit: Payer: 59 | Admitting: Internal Medicine

## 2017-04-18 DIAGNOSIS — H903 Sensorineural hearing loss, bilateral: Secondary | ICD-10-CM | POA: Diagnosis not present

## 2017-04-24 ENCOUNTER — Other Ambulatory Visit: Payer: Self-pay | Admitting: Internal Medicine

## 2017-04-24 DIAGNOSIS — E1165 Type 2 diabetes mellitus with hyperglycemia: Secondary | ICD-10-CM

## 2017-04-24 DIAGNOSIS — Z794 Long term (current) use of insulin: Principal | ICD-10-CM

## 2017-05-04 ENCOUNTER — Other Ambulatory Visit: Payer: Self-pay | Admitting: Internal Medicine

## 2017-05-04 DIAGNOSIS — E1165 Type 2 diabetes mellitus with hyperglycemia: Secondary | ICD-10-CM

## 2017-05-04 DIAGNOSIS — Z794 Long term (current) use of insulin: Principal | ICD-10-CM

## 2017-05-22 ENCOUNTER — Other Ambulatory Visit: Payer: Self-pay | Admitting: Internal Medicine

## 2017-05-22 DIAGNOSIS — E785 Hyperlipidemia, unspecified: Secondary | ICD-10-CM

## 2017-05-24 DIAGNOSIS — R42 Dizziness and giddiness: Secondary | ICD-10-CM | POA: Diagnosis not present

## 2017-05-24 DIAGNOSIS — H903 Sensorineural hearing loss, bilateral: Secondary | ICD-10-CM | POA: Diagnosis not present

## 2017-05-24 DIAGNOSIS — H9121 Sudden idiopathic hearing loss, right ear: Secondary | ICD-10-CM | POA: Diagnosis not present

## 2017-06-01 ENCOUNTER — Other Ambulatory Visit: Payer: Self-pay | Admitting: Internal Medicine

## 2017-06-24 ENCOUNTER — Telehealth: Payer: Self-pay | Admitting: Internal Medicine

## 2017-06-24 NOTE — Telephone Encounter (Signed)
Have her schedule her annual exam in the next available slot, we will refill her meds until her appt.

## 2017-06-24 NOTE — Telephone Encounter (Signed)
Left detailed msg on VM per HIPAA  

## 2017-06-24 NOTE — Telephone Encounter (Signed)
Can I work pt in her last cpx 07/05/16   Copied from [place]. Topic: Appointment Scheduling - Scheduling Inquiry for Clinic >> Jun 21, 2017 11:09 AM Ether Griffins B wrote: Reason for CRM: pt is needing a cpe before April due to a medication she is on and needing blood work done before a refill can be given. Her last cpe was 07/05/16.

## 2017-06-25 ENCOUNTER — Ambulatory Visit: Payer: 59 | Admitting: Internal Medicine

## 2017-06-25 ENCOUNTER — Encounter: Payer: Self-pay | Admitting: Internal Medicine

## 2017-06-25 VITALS — BP 120/72 | HR 98 | Ht 66.5 in | Wt 191.4 lb

## 2017-06-25 DIAGNOSIS — E663 Overweight: Secondary | ICD-10-CM

## 2017-06-25 DIAGNOSIS — E1165 Type 2 diabetes mellitus with hyperglycemia: Secondary | ICD-10-CM

## 2017-06-25 DIAGNOSIS — Z794 Long term (current) use of insulin: Secondary | ICD-10-CM

## 2017-06-25 LAB — POCT GLYCOSYLATED HEMOGLOBIN (HGB A1C): HEMOGLOBIN A1C: 8.2

## 2017-06-25 NOTE — Progress Notes (Signed)
Patient ID: [name], female   DOB: 11/27/1955, 62 y.o.   MRN: [id number]   HPI: [name] is a 62 y.o.-year-old female, returning for f/u for DM2, dx in 2013, insulin-dependent since 2016, uncontrolled, without long term complications. Last visit 5 months ago.  She had more vertigo since last visit >> seeing a specialist at [facility] >> changed form Meclizine to Valium.  Last hemoglobin A1c was: Lab Results  Component Value Date   HGBA1C 8.2 06/25/2017   HGBA1C 7.8 01/16/2017   HGBA1C 8.9 09/27/2016   Pt is on a regimen of: - Glipizide 10 mg before meals - Metformin 1000 mg 2x a day, with meals - Januvia 100 mg at night >> moved before b'fast - Invokana 100 mg before b'fast - Toujeo 40 units at bedtime.  Pt checks her sugars twice a day: - am: 130-160 >> 126-175, 186 >> 105-125 >> 100-130 - 2h after b'fast: n/c >> 147, 164 >> n/c - before lunch: n/c >> 160 >> 120-130 >> n/c - 2h after lunch: n/c >> 151 >> n/c - before dinner: n/c >> 140, 142 >> n/c - 2h after dinner:  131-163, 205 >> 150-200 >> 150s-200, 220 - bedtime: n/c >> 179  - nighttime: n/c Lowest sugar was 105 >> 100; unclear at what level she has hypoglycemia awareness. Highest sugar was 200 >> 220.  Glucometer: OneTouch Ultra  Pt's meals are: - Breakfast: grits, meat or cereals - Lunch: sandwich or bowl of cereal - Dinner: 2-3x a week eats out: chicken, steak, ribs - Snacks: 2 No sodas; water + crystal light.  -No CKD, last BUN/creatinine:  Lab Results  Component Value Date   BUN 18 09/27/2016   BUN 17 07/05/2016   CREATININE 0.85 09/27/2016   CREATININE 0.87 07/05/2016   -+ HL; last set of lipids: Lab Results  Component Value Date   CHOL 195 07/05/2016   HDL 56.40 07/05/2016   LDLCALC 96 03/24/2015   LDLDIRECT 99.0 07/05/2016   TRIG 293.0 (H) 07/05/2016   CHOLHDL 3 07/05/2016  On fish oil, Livalo, fenofibrate - last eye exam was in 08/2016: No DR - No numbness and tingling in her  feet.  ROS: Constitutional: no weight gain/no weight loss, no fatigue, no subjective hyperthermia, no subjective hypothermia Eyes: no blurry vision, no xerophthalmia ENT: no sore throat, no nodules palpated in throat, no dysphagia, no odynophagia, no hoarseness, + decreased hearing Cardiovascular: no CP/no SOB/no palpitations/no leg swelling Respiratory: no cough/no SOB/no wheezing Gastrointestinal: no N/no V/no D/no C/no acid reflux Musculoskeletal: no muscle aches/+ joint aches Skin: no rashes, no hair loss Neurological: no tremors/no numbness/no tingling/+ dizziness  I reviewed pt's medications, allergies, PMH, social hx, family hx, and changes were documented in the history of present illness. Otherwise, unchanged from my initial visit note.   Past Medical History:  Diagnosis Date  . Blood transfusion    1984   [place]  . Chronic kidney disease    occasional renal stone  . Complication of anesthesia    woke during surgery (ablation) 2006?  . Diabetes mellitus    type 2 Diab- A1C-7  . GERD (gastroesophageal reflux disease)    occasional Protonix-will take protonix night before surgery  . Hypercholesteremia    on meds   Past Surgical History:  Procedure Laterality Date  . BLADDER SUSPENSION  12/08/2010   Procedure: TRANSVAGINAL TAPE (TVT) PROCEDURE;  Surgeon: Arloa Koh;  Location: [facility];  Service: Gynecology;  Laterality: N/A;  transvaginal  tape with cystoscopy  . BLADDER SUSPENSION  01/30/2011   Procedure: TRANSVAGINAL TAPE (TVT) PROCEDURE;  Surgeon: Arloa Koh;  Location: [facility];  Service: Gynecology;  Laterality: N/A;  excision of excess sling from suprapubic incision and cystoscopy  . [place] SURGERY  2004  . CYSTOSCOPY  01/30/2011   Procedure: CYSTOSCOPY;  Surgeon: Arloa Koh;  Location: [facility];  Service: Gynecology;  Laterality: N/A;  . TONSILLECTOMY  1976  . transvaginal tape  8/12  . uterine ablation     Social History   Social History  . Marital  status: Married    Spouse name: N/A  . Number of children: 2   Occupational History  . retired   Social History Main Topics  . Smoking status: Former Smoker    Packs/day: 0.75    Years: 30.00    Types: Cigarettes  . Smokeless tobacco: Never Used     Comment: recently quit 03/01/2015  . Alcohol use No  . Drug use: No   Current Outpatient Medications on File Prior to Visit  Medication Sig Dispense Refill  . B-D UF III MINI PEN NEEDLES 31G X 5 MM MISC Use to inject insulin daily 100 each 5  . Calcium Carbonate-Vitamin D (CALTRATE 600+D PO) Take by mouth.    . diazepam (VALIUM) 2 MG tablet Take 2 mg by mouth.    . fenofibrate (TRICOR) 145 MG tablet Take 1 tablet (145 mg total) by mouth daily. 30 tablet 5  . glipiZIDE (GLUCOTROL) 10 MG tablet TAKE 1 TABLET (10 MG TOTAL) BY MOUTH 2 (TWO) TIMES DAILY BEFORE A MEAL. 60 tablet 0  . glucose blood (FREESTYLE LITE) test strip 1 each by Other route 2 (two) times daily as needed for other. 100 each 11  . Insulin Glargine (TOUJEO SOLOSTAR) 300 UNIT/ML SOPN Inject 40 Units into the skin at bedtime. NEED APPOINTMENT 7.5 pen 0  . INVOKANA 100 MG TABS tablet TAKE 1 TABLET (100 MG TOTAL) BY MOUTH DAILY. 30 tablet 5  . metFORMIN (GLUCOPHAGE) 1000 MG tablet TAKE 1 TABLET (1,000 MG TOTAL) BY MOUTH 2 (TWO) TIMES DAILY WITH A MEAL. 180 tablet 0  . Multiple Vitamins-Minerals (MULTIVITAMINS THER. W/MINERALS) TABS Take 1 tablet by mouth daily.      . Omega-3 Fatty Acids (FISH OIL) 500 MG CAPS Take 500 mg by mouth daily.    Marland Kitchen OVER THE COUNTER MEDICATION Take 1 tablet by mouth 2 (two) times daily. PATIENT TAKES CINNAMON TABLETS 1000mg     . OVER THE COUNTER MEDICATION     . pantoprazole (PROTONIX) 40 MG tablet Take 1 tablet by mouth daily.  3  . Pitavastatin Calcium (LIVALO) 2 MG TABS Take 1 tablet (2 mg total) by mouth daily. MUST SCHEDULE ANNUAL EXAM FOR REFILLS 30 tablet 1  . sitaGLIPtin (JANUVIA) 100 MG tablet Take 1 tablet (100 mg total) by mouth daily. 30  tablet 1   No current facility-administered medications on file prior to visit.    Allergies  Allergen Reactions  . Jardiance [Empagliflozin]     Myalgias   Family History  Problem Relation Age of Onset  . Hypertension Mother   . Hyperlipidemia Mother   . Heart disease Mother   . Heart disease Father   . Hyperlipidemia Father   . Diabetes Father   . Heart attack Father   . Varicose Veins Father   . Peripheral vascular disease Father        amputation  . Cancer Paternal Aunt  lung/ brain    PE: BP 120/72   Pulse 98   Ht 5' 6.5" (1.689 m)   Wt 191 lb 6.4 oz (86.8 kg)   SpO2 97%   BMI 30.43 kg/m  Wt Readings from Last 3 Encounters:  06/25/17 191 lb 6.4 oz (86.8 kg)  01/16/17 186 lb (84.4 kg)  09/27/16 192 lb (87.1 kg)   Constitutional: overweight, in NAD Eyes: PERRLA, EOMI, no exophthalmos ENT: moist mucous membranes, no thyromegaly, no cervical lymphadenopathy Cardiovascular: Tachycardia, RR, No MRG Respiratory: CTA B Gastrointestinal: abdomen soft, NT, ND, BS+ Musculoskeletal: no deformities, strength intact in all 4 Skin: moist, warm, no rashes Neurological: no tremor with outstretched hands, DTR normal in all 4  ASSESSMENT: 1. DM2, insulin-dependent, uncontrolled, without long term complications, but with hyperglycemia She initially had muscle cramps and skin peeling from Invokana, not recently  2. Overweight  PLAN:  1. Patient with long-standing, uncontrolled, diabetes, on oral medication regimen and long-acting insulin.  Since last visit, her sugars started to increase >> today, HbA1c is 8.2% (higher) - Sugars are at goal in the morning but increase towards the end of the day.  They are higher after dinner and she is telling me that she takes her glipizide after she finishes dinner.  We discussed to move the glipizide before meals. - I also advised her to improve her diet - Otherwise, we will continue current regimen -  I suggested to:  Patient  Instructions  Please continue: - Glipizide 10 mg but move this 20-30 min before b'fast and dinner - Metformin 1000 mg 2x a day, with meals - Januvia 100 mg before b'fast - Invokana 100 mg before b'fast - Toujeo 40 units at bedtime.  Please return in 3 months with your sugar log.  - continue checking sugars at different times of the day - check 1-2x a day, rotating checks - advised for yearly eye exams >> she is UTD - Return to clinic in 3 mo with sugar log   2. Overweight -She gained weight since last visit -Continue Invokana which should help her with weight loss also. -Also discussed about improving diet  Philemon Kingdom, MD PhD [facility] Endocrinology

## 2017-06-25 NOTE — Patient Instructions (Addendum)
Please continue: - Glipizide 10 mg but move this 20-30 min before b'fast and dinner - Metformin 1000 mg 2x a day, with meals - Januvia 100 mg before b'fast - Invokana 100 mg before b'fast - Toujeo 40 units at bedtime.  Please return in 3 months with your sugar log.

## 2017-07-02 ENCOUNTER — Other Ambulatory Visit: Payer: Self-pay | Admitting: Internal Medicine

## 2017-07-02 DIAGNOSIS — E1165 Type 2 diabetes mellitus with hyperglycemia: Secondary | ICD-10-CM

## 2017-07-02 DIAGNOSIS — Z794 Long term (current) use of insulin: Principal | ICD-10-CM

## 2017-07-03 NOTE — Telephone Encounter (Signed)
Spoke to pt and she states she was in the middle of doing something and will have to call back to schedule appt for CPE and I will refill medication until appt

## 2017-07-15 ENCOUNTER — Other Ambulatory Visit: Payer: Self-pay | Admitting: Internal Medicine

## 2017-07-15 DIAGNOSIS — E785 Hyperlipidemia, unspecified: Secondary | ICD-10-CM

## 2017-07-29 DIAGNOSIS — Z1231 Encounter for screening mammogram for malignant neoplasm of breast: Secondary | ICD-10-CM | POA: Diagnosis not present

## 2017-07-30 ENCOUNTER — Other Ambulatory Visit: Payer: Self-pay | Admitting: Internal Medicine

## 2017-07-31 ENCOUNTER — Other Ambulatory Visit: Payer: Self-pay | Admitting: Internal Medicine

## 2017-07-31 DIAGNOSIS — E1165 Type 2 diabetes mellitus with hyperglycemia: Secondary | ICD-10-CM

## 2017-07-31 DIAGNOSIS — Z794 Long term (current) use of insulin: Principal | ICD-10-CM

## 2017-08-05 ENCOUNTER — Other Ambulatory Visit: Payer: Self-pay | Admitting: Internal Medicine

## 2017-08-05 DIAGNOSIS — Z794 Long term (current) use of insulin: Principal | ICD-10-CM

## 2017-08-05 DIAGNOSIS — E1165 Type 2 diabetes mellitus with hyperglycemia: Secondary | ICD-10-CM

## 2017-08-17 ENCOUNTER — Other Ambulatory Visit: Payer: Self-pay | Admitting: Internal Medicine

## 2017-08-17 DIAGNOSIS — E785 Hyperlipidemia, unspecified: Secondary | ICD-10-CM

## 2017-08-25 ENCOUNTER — Other Ambulatory Visit: Payer: Self-pay | Admitting: Internal Medicine

## 2017-09-04 LAB — HM DIABETES EYE EXAM

## 2017-09-05 DIAGNOSIS — E119 Type 2 diabetes mellitus without complications: Secondary | ICD-10-CM | POA: Diagnosis not present

## 2017-09-10 ENCOUNTER — Encounter: Payer: Self-pay | Admitting: Internal Medicine

## 2017-09-15 ENCOUNTER — Other Ambulatory Visit: Payer: Self-pay | Admitting: Internal Medicine

## 2017-09-15 DIAGNOSIS — E785 Hyperlipidemia, unspecified: Secondary | ICD-10-CM

## 2017-09-17 DIAGNOSIS — D1801 Hemangioma of skin and subcutaneous tissue: Secondary | ICD-10-CM | POA: Diagnosis not present

## 2017-09-17 DIAGNOSIS — L570 Actinic keratosis: Secondary | ICD-10-CM | POA: Diagnosis not present

## 2017-09-17 DIAGNOSIS — L814 Other melanin hyperpigmentation: Secondary | ICD-10-CM | POA: Diagnosis not present

## 2017-09-17 DIAGNOSIS — L821 Other seborrheic keratosis: Secondary | ICD-10-CM | POA: Diagnosis not present

## 2017-09-18 ENCOUNTER — Ambulatory Visit: Payer: 59 | Admitting: Internal Medicine

## 2017-10-02 ENCOUNTER — Other Ambulatory Visit: Payer: Self-pay | Admitting: Internal Medicine

## 2017-10-03 ENCOUNTER — Other Ambulatory Visit: Payer: Self-pay | Admitting: Internal Medicine

## 2017-10-03 DIAGNOSIS — E785 Hyperlipidemia, unspecified: Secondary | ICD-10-CM

## 2017-10-21 ENCOUNTER — Other Ambulatory Visit: Payer: Self-pay | Admitting: Internal Medicine

## 2017-10-21 DIAGNOSIS — E785 Hyperlipidemia, unspecified: Secondary | ICD-10-CM

## 2017-11-05 ENCOUNTER — Other Ambulatory Visit: Payer: Self-pay | Admitting: Internal Medicine

## 2017-11-05 DIAGNOSIS — E1165 Type 2 diabetes mellitus with hyperglycemia: Secondary | ICD-10-CM

## 2017-11-05 DIAGNOSIS — Z794 Long term (current) use of insulin: Principal | ICD-10-CM

## 2017-11-07 ENCOUNTER — Other Ambulatory Visit: Payer: Self-pay | Admitting: Internal Medicine

## 2017-11-07 DIAGNOSIS — Z794 Long term (current) use of insulin: Principal | ICD-10-CM

## 2017-11-07 DIAGNOSIS — E1165 Type 2 diabetes mellitus with hyperglycemia: Secondary | ICD-10-CM

## 2017-11-07 DIAGNOSIS — E785 Hyperlipidemia, unspecified: Secondary | ICD-10-CM

## 2017-11-08 NOTE — Telephone Encounter (Signed)
Last lipid panel 07/2016. No upcoming appts scheduled.  pls advise

## 2017-11-09 NOTE — Telephone Encounter (Signed)
Will give 30 day supply, will need appt for further refills.

## 2017-11-12 NOTE — Telephone Encounter (Signed)
OV required letter mailed to pt

## 2017-11-14 ENCOUNTER — Other Ambulatory Visit: Payer: Self-pay | Admitting: Internal Medicine

## 2017-11-14 DIAGNOSIS — E785 Hyperlipidemia, unspecified: Secondary | ICD-10-CM

## 2017-12-06 ENCOUNTER — Other Ambulatory Visit: Payer: Self-pay | Admitting: Internal Medicine

## 2017-12-06 DIAGNOSIS — H9121 Sudden idiopathic hearing loss, right ear: Secondary | ICD-10-CM | POA: Diagnosis not present

## 2017-12-06 DIAGNOSIS — H903 Sensorineural hearing loss, bilateral: Secondary | ICD-10-CM | POA: Diagnosis not present

## 2017-12-06 DIAGNOSIS — R42 Dizziness and giddiness: Secondary | ICD-10-CM | POA: Diagnosis not present

## 2017-12-06 DIAGNOSIS — E785 Hyperlipidemia, unspecified: Secondary | ICD-10-CM

## 2017-12-09 ENCOUNTER — Other Ambulatory Visit: Payer: 59

## 2017-12-09 ENCOUNTER — Encounter: Payer: Self-pay | Admitting: Internal Medicine

## 2017-12-09 ENCOUNTER — Ambulatory Visit (INDEPENDENT_AMBULATORY_CARE_PROVIDER_SITE_OTHER): Payer: 59 | Admitting: Internal Medicine

## 2017-12-09 VITALS — BP 120/74 | HR 74 | Temp 98.2°F | Ht 65.5 in | Wt 188.0 lb

## 2017-12-09 DIAGNOSIS — K5900 Constipation, unspecified: Secondary | ICD-10-CM | POA: Diagnosis not present

## 2017-12-09 DIAGNOSIS — E119 Type 2 diabetes mellitus without complications: Secondary | ICD-10-CM | POA: Diagnosis not present

## 2017-12-09 DIAGNOSIS — H8110 Benign paroxysmal vertigo, unspecified ear: Secondary | ICD-10-CM

## 2017-12-09 DIAGNOSIS — Z794 Long term (current) use of insulin: Secondary | ICD-10-CM | POA: Diagnosis not present

## 2017-12-09 DIAGNOSIS — E1165 Type 2 diabetes mellitus with hyperglycemia: Secondary | ICD-10-CM | POA: Diagnosis not present

## 2017-12-09 DIAGNOSIS — K219 Gastro-esophageal reflux disease without esophagitis: Secondary | ICD-10-CM

## 2017-12-09 DIAGNOSIS — Z0000 Encounter for general adult medical examination without abnormal findings: Secondary | ICD-10-CM

## 2017-12-09 DIAGNOSIS — M1990 Unspecified osteoarthritis, unspecified site: Secondary | ICD-10-CM

## 2017-12-09 DIAGNOSIS — E7800 Pure hypercholesterolemia, unspecified: Secondary | ICD-10-CM

## 2017-12-09 LAB — MICROALBUMIN / CREATININE URINE RATIO
Creatinine,U: 118.9 mg/dL
Microalb Creat Ratio: 0.9 mg/g (ref 0.0–30.0)
Microalb, Ur: 1.1 mg/dL (ref 0.0–1.9)

## 2017-12-09 LAB — COMPREHENSIVE METABOLIC PANEL
ALT: 23 U/L (ref 0–35)
AST: 18 U/L (ref 0–37)
Albumin: 4.3 g/dL (ref 3.5–5.2)
Alkaline Phosphatase: 51 U/L (ref 39–117)
BUN: 16 mg/dL (ref 6–23)
CALCIUM: 9.6 mg/dL (ref 8.4–10.5)
CO2: 29 meq/L (ref 19–32)
Chloride: 104 mEq/L (ref 96–112)
Creatinine, Ser: 1.1 mg/dL (ref 0.40–1.20)
GFR: 53.88 mL/min — AB (ref >60.00)
GLUCOSE: 226 mg/dL — AB (ref 70–99)
Potassium: 4.2 mEq/L (ref 3.5–5.1)
Sodium: 140 mEq/L (ref 135–145)
Total Bilirubin: 0.5 mg/dL (ref 0.2–1.2)
Total Protein: 7 g/dL (ref 6.0–8.3)

## 2017-12-09 LAB — CBC
HCT: 39.7 % (ref 36.0–46.0)
HEMOGLOBIN: 13.2 g/dL (ref 12.0–15.0)
MCHC: 33.3 g/dL (ref 30.0–36.0)
MCV: 86.8 fl (ref 78.0–100.0)
PLATELETS: 249 10*3/uL (ref 150.0–400.0)
RBC: 4.58 Mil/uL (ref 3.87–5.11)
RDW: 12.7 % (ref 11.5–15.5)
WBC: 7.4 10*3/uL (ref 4.0–10.5)

## 2017-12-09 LAB — LDL CHOLESTEROL, DIRECT: Direct LDL: 107 mg/dL

## 2017-12-09 LAB — LIPID PANEL
Cholesterol: 182 mg/dL (ref 0–200)
HDL: 51.7 mg/dL (ref >39.00)
NONHDL: 130.23
TRIGLYCERIDES: 216 mg/dL — AB (ref 0.0–149.0)
Total CHOL/HDL Ratio: 4
VLDL: 43.2 mg/dL — ABNORMAL HIGH (ref 0.0–40.0)

## 2017-12-09 LAB — VITAMIN D 25 HYDROXY (VIT D DEFICIENCY, FRACTURES): VITD: 38.65 ng/mL (ref 30.00–100.00)

## 2017-12-09 LAB — HEMOGLOBIN A1C: HEMOGLOBIN A1C: 8.6 % — AB (ref 4.6–6.5)

## 2017-12-09 NOTE — Assessment & Plan Note (Signed)
Continue Valium prn She will continue to follow with ENT 

## 2017-12-09 NOTE — Assessment & Plan Note (Signed)
Try Aleve once daily

## 2017-12-09 NOTE — Assessment & Plan Note (Signed)
Stable on Pantoprazole Encouraged continue weight loss

## 2017-12-09 NOTE — Patient Instructions (Signed)
Health Maintenance for Postmenopausal Women Menopause is a normal process in which your reproductive ability comes to an end. This process happens gradually over a span of months to years, usually between the ages of 22 and 9. Menopause is complete when you have missed 12 consecutive menstrual periods. It is important to talk with your health care provider about some of the most common conditions that affect postmenopausal women, such as heart disease, cancer, and bone loss (osteoporosis). Adopting a healthy lifestyle and getting preventive care can help to promote your health and wellness. Those actions can also lower your chances of developing some of these common conditions. What should I know about menopause? During menopause, you may experience a number of symptoms, such as:  Moderate-to-severe hot flashes.  Night sweats.  Decrease in sex drive.  Mood swings.  Headaches.  Tiredness.  Irritability.  Memory problems.  Insomnia.  Choosing to treat or not to treat menopausal changes is an individual decision that you make with your health care provider. What should I know about hormone replacement therapy and supplements? Hormone therapy products are effective for treating symptoms that are associated with menopause, such as hot flashes and night sweats. Hormone replacement carries certain risks, especially as you become older. If you are thinking about using estrogen or estrogen with progestin treatments, discuss the benefits and risks with your health care provider. What should I know about heart disease and stroke? Heart disease, heart attack, and stroke become more likely as you age. This may be due, in part, to the hormonal changes that your body experiences during menopause. These can affect how your body processes dietary fats, triglycerides, and cholesterol. Heart attack and stroke are both medical emergencies. There are many things that you can do to help prevent heart disease  and stroke:  Have your blood pressure checked at least every 1-2 years. High blood pressure causes heart disease and increases the risk of stroke.  If you are 53-22 years old, ask your health care provider if you should take aspirin to prevent a heart attack or a stroke.  Do not use any tobacco products, including cigarettes, chewing tobacco, or electronic cigarettes. If you need help quitting, ask your health care provider.  It is important to eat a healthy diet and maintain a healthy weight. ? Be sure to include plenty of vegetables, fruits, low-fat dairy products, and lean protein. ? Avoid eating foods that are high in solid fats, added sugars, or salt (sodium).  Get regular exercise. This is one of the most important things that you can do for your health. ? Try to exercise for at least 150 minutes each week. The type of exercise that you do should increase your heart rate and make you sweat. This is known as moderate-intensity exercise. ? Try to do strengthening exercises at least twice each week. Do these in addition to the moderate-intensity exercise.  Know your numbers.Ask your health care provider to check your cholesterol and your blood glucose. Continue to have your blood tested as directed by your health care provider.  What should I know about cancer screening? There are several types of cancer. Take the following steps to reduce your risk and to catch any cancer development as early as possible. Breast Cancer  Practice breast self-awareness. ? This means understanding how your breasts normally appear and feel. ? It also means doing regular breast self-exams. Let your health care provider know about any changes, no matter how small.  If you are 40  or older, have a clinician do a breast exam (clinical breast exam or CBE) every year. Depending on your age, family history, and medical history, it may be recommended that you also have a yearly breast X-ray (mammogram).  If you  have a family history of breast cancer, talk with your health care provider about genetic screening.  If you are at high risk for breast cancer, talk with your health care provider about having an MRI and a mammogram every year.  Breast cancer (BRCA) gene test is recommended for women who have family members with BRCA-related cancers. Results of the assessment will determine the need for genetic counseling and BRCA1 and for BRCA2 testing. BRCA-related cancers include these types: ? Breast. This occurs in males or females. ? Ovarian. ? Tubal. This may also be called fallopian tube cancer. ? Cancer of the abdominal or pelvic lining (peritoneal cancer). ? Prostate. ? Pancreatic.  Cervical, Uterine, and Ovarian Cancer Your health care provider may recommend that you be screened regularly for cancer of the pelvic organs. These include your ovaries, uterus, and vagina. This screening involves a pelvic exam, which includes checking for microscopic changes to the surface of your cervix (Pap test).  For women ages 21-65, health care providers may recommend a pelvic exam and a Pap test every three years. For women ages 79-65, they may recommend the Pap test and pelvic exam, combined with testing for human papilloma virus (HPV), every five years. Some types of HPV increase your risk of cervical cancer. Testing for HPV may also be done on women of any age who have unclear Pap test results.  Other health care providers may not recommend any screening for nonpregnant women who are considered low risk for pelvic cancer and have no symptoms. Ask your health care provider if a screening pelvic exam is right for you.  If you have had past treatment for cervical cancer or a condition that could lead to cancer, you need Pap tests and screening for cancer for at least 20 years after your treatment. If Pap tests have been discontinued for you, your risk factors (such as having a new sexual partner) need to be  reassessed to determine if you should start having screenings again. Some women have medical problems that increase the chance of getting cervical cancer. In these cases, your health care provider may recommend that you have screening and Pap tests more often.  If you have a family history of uterine cancer or ovarian cancer, talk with your health care provider about genetic screening.  If you have vaginal bleeding after reaching menopause, tell your health care provider.  There are currently no reliable tests available to screen for ovarian cancer.  Lung Cancer Lung cancer screening is recommended for adults 69-62 years old who are at high risk for lung cancer because of a history of smoking. A yearly low-dose CT scan of the lungs is recommended if you:  Currently smoke.  Have a history of at least 30 pack-years of smoking and you currently smoke or have quit within the past 15 years. A pack-year is smoking an average of one pack of cigarettes per day for one year.  Yearly screening should:  Continue until it has been 15 years since you quit.  Stop if you develop a health problem that would prevent you from having lung cancer treatment.  Colorectal Cancer  This type of cancer can be detected and can often be prevented.  Routine colorectal cancer screening usually begins at  age 42 and continues through age 45.  If you have risk factors for colon cancer, your health care provider may recommend that you be screened at an earlier age.  If you have a family history of colorectal cancer, talk with your health care provider about genetic screening.  Your health care provider may also recommend using home test kits to check for hidden blood in your stool.  A small camera at the end of a tube can be used to examine your colon directly (sigmoidoscopy or colonoscopy). This is done to check for the earliest forms of colorectal cancer.  Direct examination of the colon should be repeated every  5-10 years until age 71. However, if early forms of precancerous polyps or small growths are found or if you have a family history or genetic risk for colorectal cancer, you may need to be screened more often.  Skin Cancer  Check your skin from head to toe regularly.  Monitor any moles. Be sure to tell your health care provider: ? About any new moles or changes in moles, especially if there is a change in a mole's shape or color. ? If you have a mole that is larger than the size of a pencil eraser.  If any of your family members has a history of skin cancer, especially at a young age, talk with your health care provider about genetic screening.  Always use sunscreen. Apply sunscreen liberally and repeatedly throughout the day.  Whenever you are outside, protect yourself by wearing long sleeves, pants, a wide-brimmed hat, and sunglasses.  What should I know about osteoporosis? Osteoporosis is a condition in which bone destruction happens more quickly than new bone creation. After menopause, you may be at an increased risk for osteoporosis. To help prevent osteoporosis or the bone fractures that can happen because of osteoporosis, the following is recommended:  If you are 46-71 years old, get at least 1,000 mg of calcium and at least 600 mg of vitamin D per day.  If you are older than age 55 but younger than age 65, get at least 1,200 mg of calcium and at least 600 mg of vitamin D per day.  If you are older than age 54, get at least 1,200 mg of calcium and at least 800 mg of vitamin D per day.  Smoking and excessive alcohol intake increase the risk of osteoporosis. Eat foods that are rich in calcium and vitamin D, and do weight-bearing exercises several times each week as directed by your health care provider. What should I know about how menopause affects my mental health? Depression may occur at any age, but it is more common as you become older. Common symptoms of depression  include:  Low or sad mood.  Changes in sleep patterns.  Changes in appetite or eating patterns.  Feeling an overall lack of motivation or enjoyment of activities that you previously enjoyed.  Frequent crying spells.  Talk with your health care provider if you think that you are experiencing depression. What should I know about immunizations? It is important that you get and maintain your immunizations. These include:  Tetanus, diphtheria, and pertussis (Tdap) booster vaccine.  Influenza every year before the flu season begins.  Pneumonia vaccine.  Shingles vaccine.  Your health care provider may also recommend other immunizations. This information is not intended to replace advice given to you by your health care provider. Make sure you discuss any questions you have with your health care provider. Document Released: 06/15/2005  Document Revised: 11/11/2015 Document Reviewed: 01/25/2015 Elsevier Interactive Patient Education  2018 [employer]  

## 2017-12-09 NOTE — Assessment & Plan Note (Signed)
CMET and lipid profile today Encouraged her to consume a low fat diet  Continue Pitavastatin, Fenofibrate and Fish Oil as prescribed

## 2017-12-09 NOTE — Assessment & Plan Note (Signed)
A1C and microalbumin today Encouraged low carb diet and exercise for weight loss Continue current meds and keep follow up with endocrinology as prescribed Foot exam today Encouraged yearly eye exams Pneumovax UTD Encouraged her to get a flu shot in the fall

## 2017-12-09 NOTE — Progress Notes (Signed)
Subjective:    Patient ID: [name], female    DOB: 07-22-1955, 62 y.o.   MRN: [id number]  HPI  Pt presents to the clinic today for her annual exam. She is also due to follow up chronic conditions.  BPPV: Intermittent. She takes Valium as needed. She follows with ENT.  Arthritis: Mainly in her hands and shoulders. She is not taking anything OTC for this.  GERD: Chronic but stable on Pantoprazole. There is no upper GI on file.  HLD: her last LDL was 99, 07/2016. She is taking Pitivastatin, Fenofibrate and Fish Oil as prescribed. She tries to consume a low fat diet.  DM 2: Her last A1C was 8.2%, 06/2017. She is checking her sugars but can not remember what they run, she forgot her meter today. She is taking Glipizide, Metformin, Januvia and Toujeo as prescribed. She is not taking the Invokana because of the side effects she has seen on TV. She follows with Dr. Cruzita Lederer.  Flu: 01/2017 Tetanus: 11/2011 Pneumovax: 07/2016 Zostovax: 07/2012 Pap Smear: 02/2017 Mammogram: 07/2016 Colon Screening: 02/2014 Vision Screening: 09/2017 Dentist: every 3 months  Diet: She does eat meat. She consumes fruits and veggies daily. She tries to avoid fried food. She drinks mostly water. Exercise: Some walking, nothing routine.   Review of Systems      Past Medical History:  Diagnosis Date  . Blood transfusion    1984   [place]  . Chronic kidney disease    occasional renal stone  . Complication of anesthesia    woke during surgery (ablation) 2006?  . Diabetes mellitus    type 2 Diab- A1C-7  . GERD (gastroesophageal reflux disease)    occasional Protonix-will take protonix night before surgery  . Hypercholesteremia    on meds    Current Outpatient Medications  Medication Sig Dispense Refill  . B-D UF III MINI PEN NEEDLES 31G X 5 MM MISC Use to inject insulin daily 100 each 5  . Calcium Carbonate-Vitamin D (CALTRATE 600+D PO) Take by mouth.    . diazepam (VALIUM) 2 MG tablet Take 2 mg by  mouth.    . fenofibrate (TRICOR) 145 MG tablet Take 1 tablet (145 mg total) by mouth daily. 30 tablet 5  . FREESTYLE LITE test strip USE TO TEST BLOOD SUGAR TWICE DAILY 200 each 0  . glipiZIDE (GLUCOTROL) 10 MG tablet TAKE 1 TABLET (10 MG TOTAL) BY MOUTH 2 (TWO) TIMES DAILY BEFORE A MEAL. 180 tablet 0  . INVOKANA 100 MG TABS tablet TAKE 1 TABLET (100 MG TOTAL) BY MOUTH DAILY. 30 tablet 5  . metFORMIN (GLUCOPHAGE) 1000 MG tablet TAKE 1 TABLET (1,000 MG TOTAL) BY MOUTH 2 (TWO) TIMES DAILY WITH A MEAL. 180 tablet 0  . Multiple Vitamins-Minerals (MULTIVITAMINS THER. W/MINERALS) TABS Take 1 tablet by mouth daily.      . Omega-3 Fatty Acids (FISH OIL) 500 MG CAPS Take 500 mg by mouth daily.    Marland Kitchen OVER THE COUNTER MEDICATION Take 1 tablet by mouth 2 (two) times daily. PATIENT TAKES CINNAMON TABLETS 1000mg     . OVER THE COUNTER MEDICATION     . pantoprazole (PROTONIX) 40 MG tablet Take 1 tablet by mouth daily.  3  . Pitavastatin Calcium (LIVALO) 2 MG TABS Take 1 tablet (2 mg total) by mouth daily. 30 tablet 0  . sitaGLIPtin (JANUVIA) 100 MG tablet Take 1 tablet (100 mg total) by mouth daily. 30 tablet 1  . TOUJEO SOLOSTAR 300 UNIT/ML SOPN INJECT 40  UNITS INTO THE SKIN AT BEDTIME. NEED APPOINTMENT 4.5 pen 3   No current facility-administered medications for this visit.     Allergies  Allergen Reactions  . Jardiance [Empagliflozin]     Myalgias    Family History  Problem Relation Age of Onset  . Hypertension Mother   . Hyperlipidemia Mother   . Heart disease Mother   . Heart disease Father   . Hyperlipidemia Father   . Diabetes Father   . Heart attack Father   . Varicose Veins Father   . Peripheral vascular disease Father        amputation  . Cancer Paternal Aunt        lung/ brain    Social History   Socioeconomic History  . Marital status: Married    Spouse name: Not on file  . Number of children: Not on file  . Years of education: Not on file  . Highest education level: Not on  file  Occupational History  . Not on file  Social Needs  . Financial resource strain: Not on file  . Food insecurity:    Worry: Not on file    Inability: Not on file  . Transportation needs:    Medical: Not on file    Non-medical: Not on file  Tobacco Use  . Smoking status: Former Smoker    Packs/day: 0.75    Years: 30.00    Pack years: 22.50    Types: Cigarettes  . Smokeless tobacco: Never Used  . Tobacco comment: recently quit 03/01/2015  Substance and Sexual Activity  . Alcohol use: No    Alcohol/week: 0.0 oz  . Drug use: No  . Sexual activity: Yes  Lifestyle  . Physical activity:    Days per week: Not on file    Minutes per session: Not on file  . Stress: Not on file  Relationships  . Social connections:    Talks on phone: Not on file    Gets together: Not on file    Attends religious service: Not on file    Active member of club or organization: Not on file    Attends meetings of clubs or organizations: Not on file    Relationship status: Not on file  . Intimate partner violence:    Fear of current or ex partner: Not on file    Emotionally abused: Not on file    Physically abused: Not on file    Forced sexual activity: Not on file  Other Topics Concern  . Not on file  Social History Narrative  . Not on file     Constitutional: Denies fever, malaise, fatigue, headache or abrupt weight changes.  HEENT: Denies eye pain, eye redness, ear pain, ringing in the ears, wax buildup, runny nose, nasal congestion, bloody nose, or sore throat. Respiratory: Denies difficulty breathing, shortness of breath, cough or sputum production.   Cardiovascular: Denies chest pain, chest tightness, palpitations or swelling in the hands or feet.  Gastrointestinal: Pt reports constipation. Denies abdominal pain, bloating, diarrhea or blood in the stool.  GU: Denies urgency, frequency, pain with urination, burning sensation, blood in urine, odor or discharge. Musculoskeletal: Pt  reports intermittent joint pain. Denies decrease in range of motion, difficulty with gait, muscle pain or joint swelling.  Skin: Denies redness, rashes, lesions or ulcercations.  Neurological: Denies dizziness, difficulty with memory, difficulty with speech or problems with balance and coordination.  Psych: Denies anxiety, depression, SI/HI.  No other specific complaints in a complete  review of systems (except as listed in HPI above).  Objective:   Physical Exam   BP 120/74   Pulse 74   Temp 98.2 F (36.8 C) (Oral)   Ht 5' 5.5" (1.664 m)   Wt 188 lb (85.3 kg)   SpO2 97%   BMI 30.81 kg/m  Wt Readings from Last 3 Encounters:  12/09/17 188 lb (85.3 kg)  06/25/17 191 lb 6.4 oz (86.8 kg)  01/16/17 186 lb (84.4 kg)    General: Appears her stated age, well developed, well nourished in NAD. Skin: Warm, dry and intact. No  ulcerations noted. HEENT: Head: normal shape and size; Eyes: sclera white, no icterus, conjunctiva pink, PERRLA and EOMs intact; Ears: Tm's gray and intact, normal light reflex; Throat/Mouth: Teeth present, mucosa pink and moist, no exudate, lesions or ulcerations noted.  Neck:  Neck supple, trachea midline. No masses, lumps or thyromegaly present.  Cardiovascular: Normal rate and rhythm. S1,S2 noted.  No murmur, rubs or gallops noted. No JVD or BLE edema. No carotid bruits noted. Pulmonary/Chest: Normal effort and positive vesicular breath sounds. No respiratory distress. No wheezes, rales or ronchi noted.  Abdomen: Soft and nontender. Normal bowel sounds. No distention or masses noted. Liver, spleen and kidneys non palpable. Musculoskeletal: Strength 5/5 BUE/BLE. No difficulty with gait.  Neurological: Alert and oriented. Cranial nerves II-XII grossly intact. Coordination normal.  Psychiatric: Mood and affect normal. Behavior is normal. Judgment and thought content normal.     BMET    Component Value Date/Time   NA 139 09/27/2016 0854   K 4.6 09/27/2016 0854    CL 104 09/27/2016 0854   CO2 24 09/27/2016 0854   GLUCOSE 175 (H) 09/27/2016 0854   BUN 18 09/27/2016 0854   CREATININE 0.85 09/27/2016 0854   CALCIUM 9.4 09/27/2016 0854   GFRNONAA 76 09/27/2016 0854   GFRAA 87 09/27/2016 0854    Lipid Panel     Component Value Date/Time   CHOL 195 07/05/2016 0957   TRIG 293.0 (H) 07/05/2016 0957   HDL 56.40 07/05/2016 0957   CHOLHDL 3 07/05/2016 0957   VLDL 58.6 (H) 07/05/2016 0957   LDLCALC 96 03/24/2015 1524    CBC    Component Value Date/Time   WBC 8.9 07/05/2016 0957   RBC 4.61 07/05/2016 0957   HGB 13.3 07/05/2016 0957   HCT 40.1 07/05/2016 0957   PLT 288.0 07/05/2016 0957   MCV 87.0 07/05/2016 0957   MCH 29.2 01/07/2014 1402   MCHC 33.1 07/05/2016 0957   RDW 12.8 07/05/2016 0957   LYMPHSABS 3.1 01/07/2014 1402   MONOABS 0.6 01/07/2014 1402   EOSABS 0.1 01/07/2014 1402   BASOSABS 0.0 01/07/2014 1402    Hgb A1C Lab Results  Component Value Date   HGBA1C 8.2 06/25/2017           Assessment & Plan:   Preventative Health Maintenance:  encouraged her to get a flu shot in the fall Tetanus, pneumovax and zostovax UTD Discussed shingrix, she declines at this time Pap smear and mammogram UTD, will request a copy Colon screening UTD Encouraged her to consume a balanced diet and exercise regimen Advised her to see an eye doctor and dentist annually Will check CBC, CMET, Lipid, A1C, Microalbumin and Vit D today.  Constipation:  Discussed adequate water intake and high fiber diet Failed Miralax OTC Try Senna S- 1 tab daily  Will follow up after labs, RTC in 1 year, sooner if needed Webb Silversmith, NP

## 2017-12-10 NOTE — Telephone Encounter (Signed)
I will wait on [name]'s notes about labs from yesterday before refilling

## 2017-12-12 ENCOUNTER — Telehealth: Payer: Self-pay | Admitting: Internal Medicine

## 2017-12-12 NOTE — Telephone Encounter (Signed)
Copied from [place] [id number]. Topic: Quick Communication - See Telephone Encounter >> Dec 12, 2017  4:31 PM Bea Graff, NT wrote: CRM for notification. See Telephone encounter for: 12/12/17. Pt calling back to get her lab results.

## 2017-12-15 IMAGING — MR MR HEAD WO/W CM
10 of 11 series · 32 of 48 positions shown · IV contrast (18ml multihance)
Comparison: Cervical spine MRI 11/20/2013

CLINICAL DATA: 60 year old female who awoke 3 months ago with
extreme dizziness nausea vomiting and right side hearing loss. Still
has not regained hearing.

Creatinine was obtained on site at [HOSPITAL] at [HOSPITAL].
Results: Creatinine 0.7 mg/dL.
EXAM:
MRI HEAD WITHOUT AND WITH CONTRAST
TECHNIQUE: Multiplanar, multiecho pulse sequences of the brain and surrounding
structures were obtained without and with intravenous contrast.
CONTRAST:  18mL MULTIHANCE GADOBENATE DIMEGLUMINE 529 MG/ML IV SOLN

[Series 2: T1 · sagittal · 5.0mm · 0.45mm/px · 4 of 21 slices shown (1 of 3)]
[im 1/21]
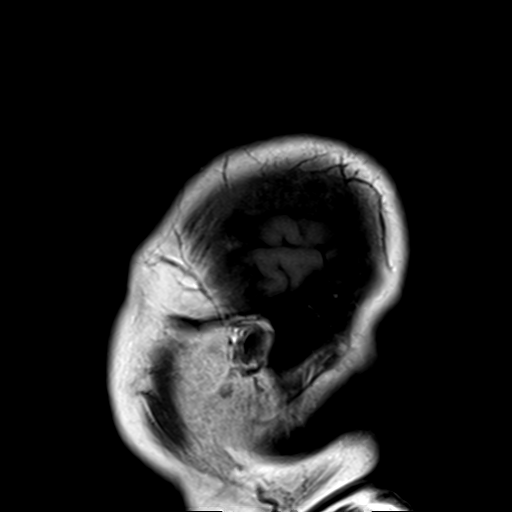
[im 7/21]
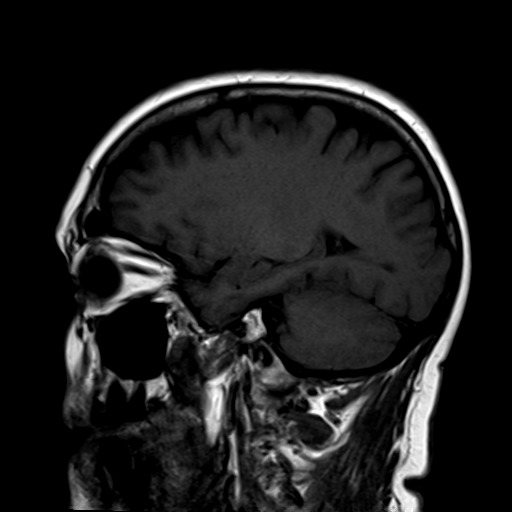
[im 14/21]
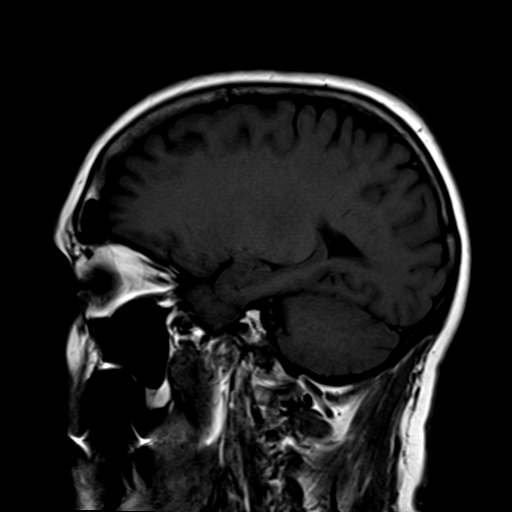
[im 21/21]
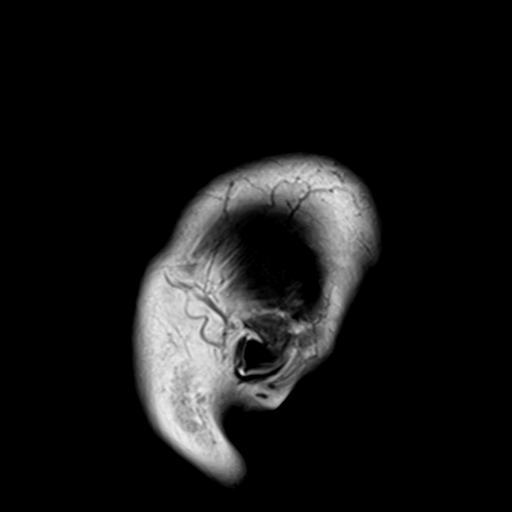

[Series 3: DWI · axial · 3.0mm · 1.80mm/px · z∈[-76,+65]mm · 9 of 96 slices shown (1 of 2)]
[im 1/96]
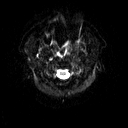
[im 16/96]
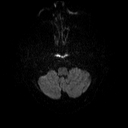
[im 32/96]
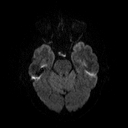
[im 40/96]
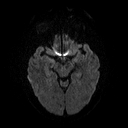
[im 48/96]
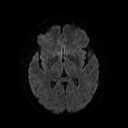
[im 56/96]
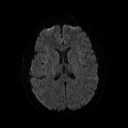
[im 64/96]
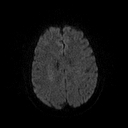
[im 80/96]
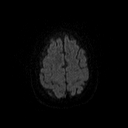
[im 96/96]
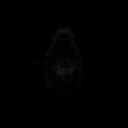

[Series 4: DWI · axial · 3.0mm · 1.80mm/px · z∈[-76,+65]mm · 6 of 45 slices shown (2 of 2)]
[im 1/45]
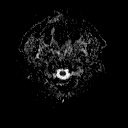
[im 9/45]
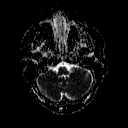
[im 18/45]
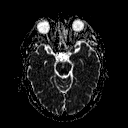
[im 27/45]
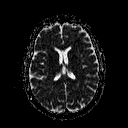
[im 36/45]
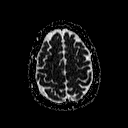
[im 45/45]
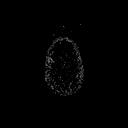

[Series 5: T2 · axial · 5.0mm · 0.45mm/px · z∈[-77,+66]mm · 3 of 23 slices shown]
[im 1/23]
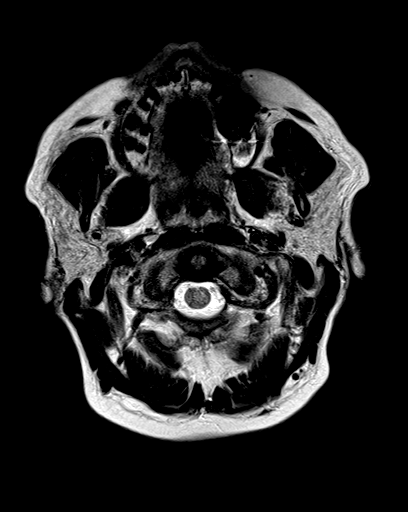
[im 12/23]
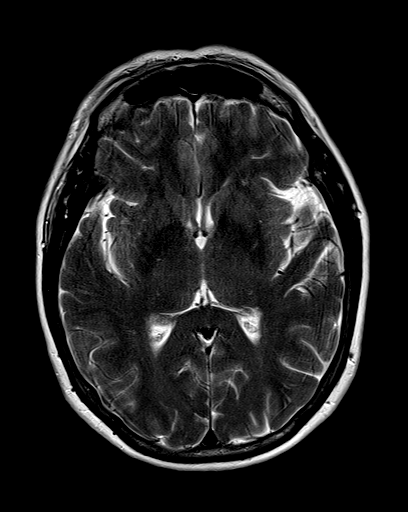
[im 23/23]
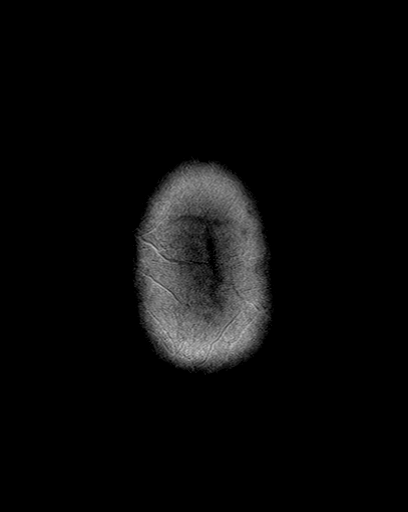

[Series 7: FLAIR · axial · 5.0mm · 0.45mm/px · z∈[-75,+68]mm · 3 of 23 slices shown]
[im 1/23]
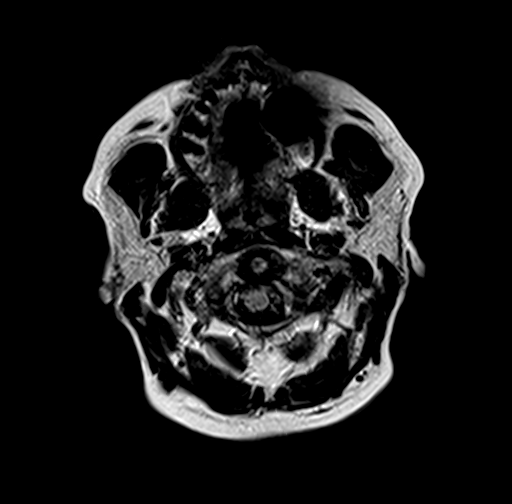
[im 12/23]
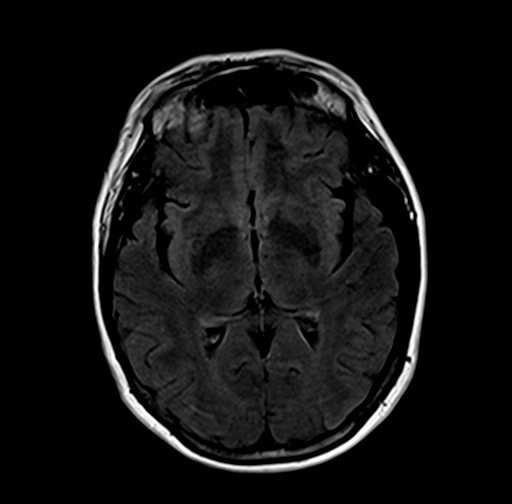
[im 23/23]
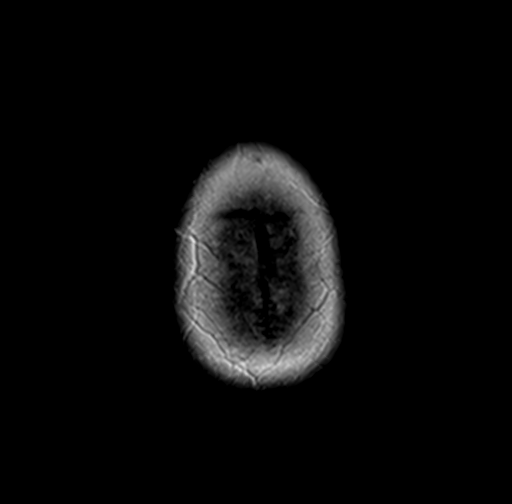

[Series 8: T1 · coronal · 3.0mm · 0.35mm/px · 1 of 11 slices shown (2 of 3)]
[im 1/11]
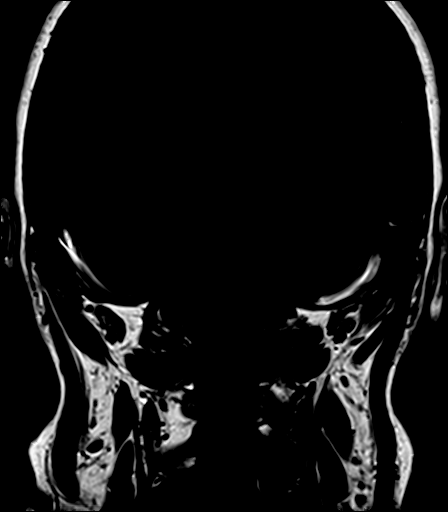

[Series 9: T1 · axial · 3.0mm · 0.35mm/px · 1 of 11 slices shown (3 of 3)]
[im 1/11]
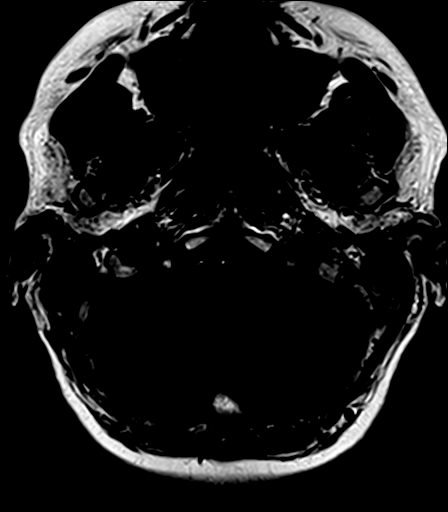

[Series 10: bSSFP · axial · 1.0mm · 0.28mm/px · z∈[-58,-41]mm · 3 of 36 slices shown]
[im 1/36]
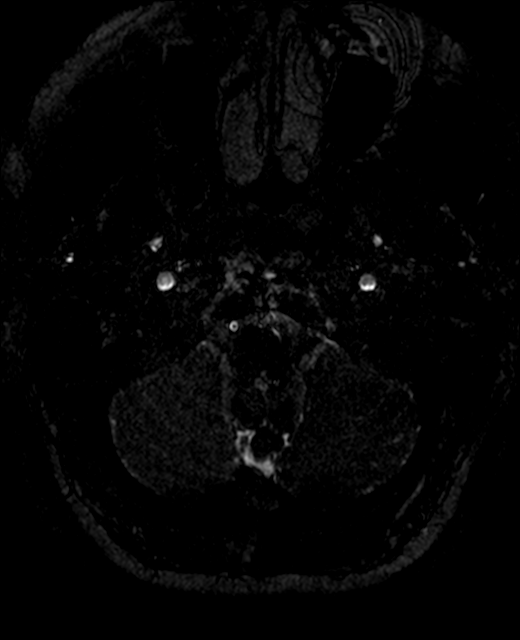
[im 9/36]
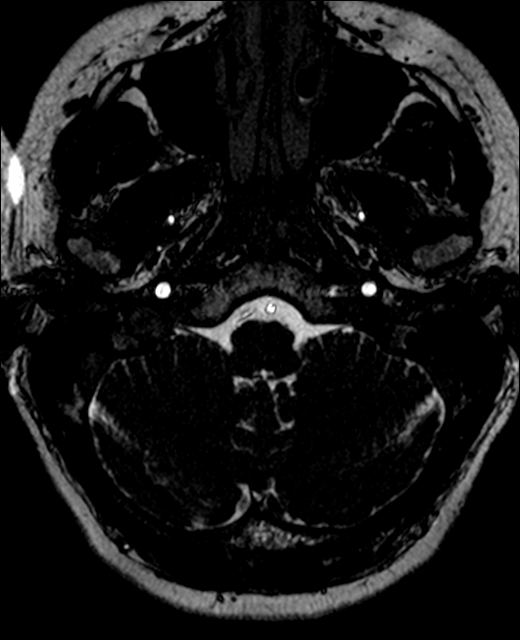
[im 18/36]
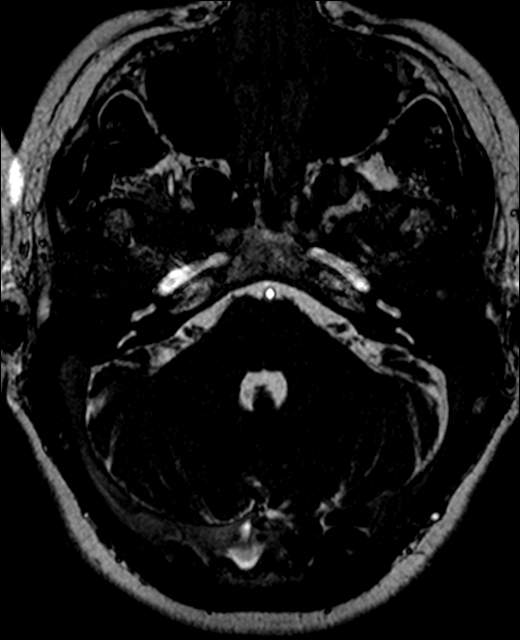

[Series 11: T1 post-contrast · coronal · 3.0mm · 0.35mm/px · 1 of 11 slices shown (1 of 2)]
[im 1/11]
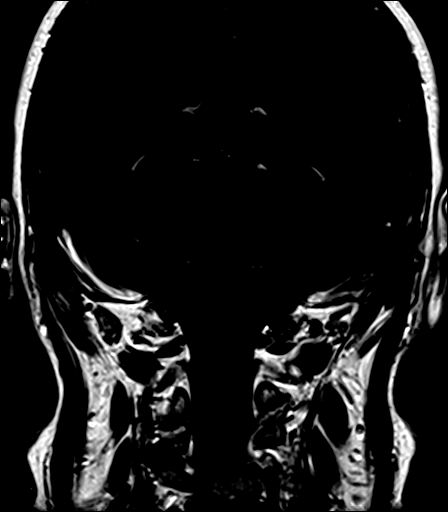

[Series 12: T1 post-contrast · axial · 3.0mm · 0.35mm/px · 1 of 11 slices shown (2 of 2)]
[im 1/11]
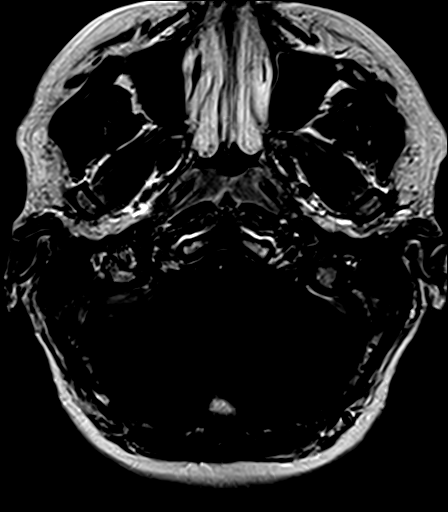

[32 of 48 positions shown; findings below may reference images not displayed]

FINDINGS: Brain: Cerebral volume is normal. No restricted diffusion to suggest
acute infarction. No midline shift, mass effect, evidence of mass
lesion, ventriculomegaly, extra-axial collection or acute
intracranial hemorrhage. Cervicomedullary junction and pituitary are
within normal limits. Gray and white matter signal is within normal
limits for age throughout the brain. Gray and white matter signal is
within normal limits for age throughout the brain. No abnormal
enhancement identified. No dural thickening.

Vascular: Major intracranial vascular flow voids appear normal.

Skull and upper cervical spine: Negative. Normal bone marrow signal.

Sinuses/Orbits: Mildly motion degraded, orbits soft tissues appear
normal. Minimal to mild bilateral paranasal sinus mucosal
thickening. Negative scalp soft tissues.

Other: Dedicated internal auditory canal imaging. Normal
cerebellopontine angles. Normal bilateral cisternal and
intracanalicular 7th and 8th cranial nerve segments. Preserved T2
signal in the bilateral cochlea and vestibular structures. No
abnormal enhancement in the right IAC. However, there is possibly a
small nodular or linear area of enhancement in the midportion or
near the fundus of the left IAC, suggested on both series 13, image
19 and series 12, image 7. No cochlear enhancement. No definite
abnormal left seventh nerve enhancement (note mild asymmetric
intrinsic T1 hyperintensity of the left seventh nerve on series 9.
Mastoids are clear. Normal stylomastoid foramina. Normal parotid
glands. Normal marrow signal at the skullbase. No other abnormal
enhancement.
IMPRESSION: 1. Negative right side IAC imaging, but possible small nodular focus
of enhancement in the left IAC, versus artifact. In the absence of
left side seventh or eighth cranial nerve symptoms artifact would be
favored.
2. Normal MRI appearance of the brain.

## 2017-12-25 ENCOUNTER — Other Ambulatory Visit: Payer: Self-pay | Admitting: Internal Medicine

## 2017-12-25 DIAGNOSIS — E785 Hyperlipidemia, unspecified: Secondary | ICD-10-CM

## 2017-12-27 ENCOUNTER — Other Ambulatory Visit: Payer: Self-pay | Admitting: Internal Medicine

## 2018-01-13 ENCOUNTER — Ambulatory Visit: Payer: 59 | Admitting: Internal Medicine

## 2018-01-13 ENCOUNTER — Encounter

## 2018-01-13 ENCOUNTER — Encounter: Payer: Self-pay | Admitting: Internal Medicine

## 2018-01-13 VITALS — BP 120/70 | HR 80 | Ht 65.5 in | Wt 186.0 lb

## 2018-01-13 DIAGNOSIS — E1165 Type 2 diabetes mellitus with hyperglycemia: Secondary | ICD-10-CM | POA: Diagnosis not present

## 2018-01-13 DIAGNOSIS — Z794 Long term (current) use of insulin: Secondary | ICD-10-CM

## 2018-01-13 MED ORDER — DULAGLUTIDE 0.75 MG/0.5ML ~~LOC~~ SOAJ: SUBCUTANEOUS | 1 refills | Status: DC

## 2018-01-13 NOTE — Progress Notes (Signed)
Patient ID: [name], female   DOB: [birth year], 62 y.o.   MRN: [id number]   HPI: [name] is a 62 y.o.-year-old female, returning for f/u for DM2, dx in 2013, insulin-dependent since 2016, uncontrolled, without long term complications.  Her husband is also my patient.  Last visit 7 months ago.  She relaxed her diet since last visit >> more snacks as her grandsons live with her now.  She had vertigo >> seeing specialist at [facility] >> before last visit she was changed from meclizine to Valium.  She is feeling a little better.  Last hemoglobin A1c was: Lab Results  Component Value Date   HGBA1C 8.6 (H) 12/09/2017   HGBA1C 8.2 06/25/2017   HGBA1C 7.8 01/16/2017   Pt is on a regimen of: - Glipizide 10 mg 20-30 minutes before breakfast and dinner - Metformin 1000 mg 2x a day, with meals - Januvia 100 mg before b'fast >> lunchtime - Invokana 100 mg before b'fast  - Toujeo 40 units at bedtime.  Pt checks her sugars twice a day: - am: 126-175, 186 >> 105-125 >> 100-130 >> 120-125 - 2h after b'fast: n/c >> 147, 164 >> n/c - before lunch: n/c >> 160 >> 120-130 >> n/c - 2h after lunch: n/c >> 151 >> n/c >> <150 - before dinner: n/c >> 140, 142 >> n/c - 2h after dinner:  150-200 >> 150s-200, 220 >> <200 - bedtime: n/c >> 179  - nighttime: n/c Lowest sugar was 100 >> 110; it is unclear at which level she has hypoglycemia awareness Highest sugar was 220 >> 200.  Glucometer: OneTouch Ultra  Pt's meals are: - Breakfast: grits, meat or cereals - Lunch: sandwich or bowl of cereal - Dinner: 2-3x a week eats out: chicken, steak, ribs - Snacks: Chips, fruit No soda; water + crystal light.  -No CKD, last BUN/creatinine:  Lab Results  Component Value Date   BUN 16 12/09/2017   BUN 18 09/27/2016   CREATININE 1.10 12/09/2017   CREATININE 0.85 09/27/2016   -+ HL; last set of lipids: Lab Results  Component Value Date   CHOL 182 12/09/2017   HDL 51.70 12/09/2017   LDLCALC 96  03/24/2015   LDLDIRECT 107.0 12/09/2017   TRIG 216.0 (H) 12/09/2017   CHOLHDL 4 12/09/2017  On fish oil, fenofibrate, Livalo - last eye exam was in 09/2017: No DR -No numbness and tingling in her feet.  ROS: Constitutional: no weight gain/no weight loss, no fatigue, + subjective hyperthermia, no subjective hypothermia Eyes: no blurry vision, no xerophthalmia ENT: no sore throat, no nodules palpated in throat, no dysphagia, no odynophagia, no hoarseness Cardiovascular: no CP/no SOB/no palpitations/no leg swelling Respiratory: no cough/no SOB/no wheezing Gastrointestinal: no N/no V/no D/+ C/no acid reflux Musculoskeletal: no muscle aches/no joint aches Skin: no rashes, no hair loss Neurological: no tremors/no numbness/no tingling/no dizziness  I reviewed pt's medications, allergies, PMH, social hx, family hx, and changes were documented in the history of present illness. Otherwise, unchanged from my initial visit note.   Past Medical History:  Diagnosis Date  . Blood transfusion    1984   [place]  . Chronic kidney disease    occasional renal stone  . Complication of anesthesia    woke during surgery (ablation) 2006?  . Diabetes mellitus    type 2 Diab- A1C-7  . GERD (gastroesophageal reflux disease)    occasional Protonix-will take protonix night before surgery  . Hypercholesteremia    on meds  Past Surgical History:  Procedure Laterality Date  . BLADDER SUSPENSION  12/08/2010   Procedure: TRANSVAGINAL TAPE (TVT) PROCEDURE;  Surgeon: Arloa Koh;  Location: [facility];  Service: Gynecology;  Laterality: N/A;  transvaginal tape with cystoscopy  . BLADDER SUSPENSION  01/30/2011   Procedure: TRANSVAGINAL TAPE (TVT) PROCEDURE;  Surgeon: Arloa Koh;  Location: [facility];  Service: Gynecology;  Laterality: N/A;  excision of excess sling from suprapubic incision and cystoscopy  . [place] SURGERY  2004  . CYSTOSCOPY  01/30/2011   Procedure: CYSTOSCOPY;  Surgeon: Arloa Koh;   Location: [facility];  Service: Gynecology;  Laterality: N/A;  . TONSILLECTOMY  1976  . transvaginal tape  8/12  . uterine ablation     Social History   Social History  . Marital status: Married    Spouse name: N/A  . Number of children: 2   Occupational History  . retired   Social History Main Topics  . Smoking status: Former Smoker    Packs/day: 0.75    Years: 30.00    Types: Cigarettes  . Smokeless tobacco: Never Used     Comment: recently quit 03/01/2015  . Alcohol use No  . Drug use: No   Current Outpatient Medications on File Prior to Visit  Medication Sig Dispense Refill  . B-D UF III MINI PEN NEEDLES 31G X 5 MM MISC USE TO INJECT INSULIN DAILY 100 each 5  . Calcium Carbonate-Vitamin D (CALTRATE 600+D PO) Take by mouth.    . diazepam (VALIUM) 2 MG tablet Take 2 mg by mouth.    . fenofibrate (TRICOR) 145 MG tablet Take 1 tablet (145 mg total) by mouth daily. 30 tablet 5  . fenofibrate (TRICOR) 145 MG tablet Take 1 tablet (145 mg total) by mouth daily. 30 tablet 10  . FREESTYLE LITE test strip USE TO TEST BLOOD SUGAR TWICE DAILY 200 each 0  . glipiZIDE (GLUCOTROL) 10 MG tablet TAKE 1 TABLET (10 MG TOTAL) BY MOUTH 2 (TWO) TIMES DAILY BEFORE A MEAL. 180 tablet 0  . INVOKANA 100 MG TABS tablet TAKE 1 TABLET (100 MG TOTAL) BY MOUTH DAILY. 30 tablet 5  . LIVALO 2 MG TABS TAKE 1 TABLET BY MOUTH EVERY DAY 30 tablet 10  . metFORMIN (GLUCOPHAGE) 1000 MG tablet TAKE 1 TABLET (1,000 MG TOTAL) BY MOUTH 2 (TWO) TIMES DAILY WITH A MEAL. 180 tablet 0  . Multiple Vitamins-Minerals (MULTIVITAMINS THER. W/MINERALS) TABS Take 1 tablet by mouth daily.      . Omega-3 Fatty Acids (FISH OIL) 500 MG CAPS Take 500 mg by mouth daily.    Marland Kitchen OVER THE COUNTER MEDICATION Take 1 tablet by mouth 2 (two) times daily. PATIENT TAKES CINNAMON TABLETS 1000mg     . OVER THE COUNTER MEDICATION     . pantoprazole (PROTONIX) 40 MG tablet Take 1 tablet by mouth daily.  3  . sitaGLIPtin (JANUVIA) 100 MG tablet Take 1  tablet (100 mg total) by mouth daily. 30 tablet 1  . TOUJEO SOLOSTAR 300 UNIT/ML SOPN INJECT 40 UNITS INTO THE SKIN AT BEDTIME. NEED APPOINTMENT 4.5 pen 3   No current facility-administered medications on file prior to visit.    Allergies  Allergen Reactions  . Jardiance [Empagliflozin]     Myalgias   Family History  Problem Relation Age of Onset  . Hypertension Mother   . Hyperlipidemia Mother   . Heart disease Mother   . Heart disease Father   . Hyperlipidemia Father   .  Diabetes Father   . Heart attack Father   . Varicose Veins Father   . Peripheral vascular disease Father        amputation  . Cancer Paternal Aunt        lung/ brain    PE: BP 120/70   Pulse 80   Ht 5' 5.5" (1.664 m)   Wt 186 lb (84.4 kg)   SpO2 98%   BMI 30.48 kg/m  Wt Readings from Last 3 Encounters:  01/13/18 186 lb (84.4 kg)  12/09/17 188 lb (85.3 kg)  06/25/17 191 lb 6.4 oz (86.8 kg)   Constitutional: overweight, in NAD Eyes: PERRLA, EOMI, no exophthalmos ENT: moist mucous membranes, no thyromegaly, no cervical lymphadenopathy Cardiovascular: RRR, No MRG Respiratory: CTA B Gastrointestinal: abdomen soft, NT, ND, BS+ Musculoskeletal: no deformities, strength intact in all 4 Skin: moist, warm, no rashes Neurological: no tremor with outstretched hands, DTR normal in all 4  ASSESSMENT: 1. DM2, insulin-dependent, uncontrolled, without long term complications, but with hyperglycemia She initially had muscle cramps and skin peeling from Invokana, not recently  2. Overweight  3. HL  PLAN:  1. Patient with long-standing, uncontrolled, type 2 diabetes, on oral antidiabetic regimen and long-acting insulin, with higher sugars since last visit in the recent HbA1c that has increased to 8.6%.  At last visit, sugars were at goal in the morning but were increasing towards the end of the day.  They were higher after dinner she was taking the glipizide after finishing dinner.  We discussed about moving  the glipizide before meals.  We also discussed about improving her diet, but otherwise, we did not change her regimen. - Since last visit, she is under significant amount of stress at home and also, she has to drive her grandsons to and from school an hour away, so she does not have any time for herself.  Sugars are still high, especially after dinner, when they approach 200s. - We discussed about either adding short-acting insulin before dinner or switching to a GLP-1 receptor agonist.  We will start Trulicity, since this is covered by her insurance as her husband takes it. -  I suggested to:  Patient Instructions  Please continue: - Glipizide 10 mg 20 to 30 minutes before breakfast and dinner - Metformin 1000 mg 2x a day, with meals - Invokana 100 mg before b'fast - Toujeo 40 units at bedtime.  Please start Trulicity 2.97 mg weekly. Let me know how you are doing in 3-4 weeks to call in the higher dose to your pharmacy (1.5 mg).  Please stop Januvia 3-4 days after the first Trulicity injection.  Please return in 3 months with your sugar log.  - continue checking sugars at different times of the day - check 1x a day, rotating checks - advised for yearly eye exams >> she is UTD - Return to clinic in 3 mo with sugar log   2. Overweight -Lost 5 pounds since last visit -Continue Invokana which should also help with weight loss -We will also stop Januvia and start a GLP-1 receptor agonist which would also help  3. HL - Reviewed latest lipid panel from 12/2017: Triglycerides high, LDL slightly higher than goal Lab Results  Component Value Date   CHOL 182 12/09/2017   HDL 51.70 12/09/2017   LDLCALC 96 03/24/2015   LDLDIRECT 107.0 12/09/2017   TRIG 216.0 (H) 12/09/2017   CHOLHDL 4 12/09/2017  - Continues the Livalo, fenofibrate, fish oil without side effects.   Salena Saner  Cruzita Lederer, MD PhD [facility]

## 2018-01-13 NOTE — Patient Instructions (Addendum)
Please continue: - Glipizide 10 mg 20 to 30 minutes before breakfast and dinner - Metformin 1000 mg 2x a day, with meals - Invokana 100 mg before b'fast - Toujeo 40 units at bedtime.  Please start Trulicity 6.80 mg weekly. Let me know how you are doing in 3-4 weeks to call in the higher dose to your pharmacy (1.5 mg).  Please stop Januvia 3-4 days after the first Trulicity injection.  Please return in 3 months with your sugar log.

## 2018-01-17 ENCOUNTER — Other Ambulatory Visit: Payer: Self-pay | Admitting: Internal Medicine

## 2018-02-05 ENCOUNTER — Other Ambulatory Visit: Payer: Self-pay | Admitting: Internal Medicine

## 2018-02-05 DIAGNOSIS — Z794 Long term (current) use of insulin: Principal | ICD-10-CM

## 2018-02-05 DIAGNOSIS — E1165 Type 2 diabetes mellitus with hyperglycemia: Secondary | ICD-10-CM

## 2018-02-05 DIAGNOSIS — L308 Other specified dermatitis: Secondary | ICD-10-CM | POA: Diagnosis not present

## 2018-02-05 DIAGNOSIS — L821 Other seborrheic keratosis: Secondary | ICD-10-CM | POA: Diagnosis not present

## 2018-02-05 DIAGNOSIS — Z1283 Encounter for screening for malignant neoplasm of skin: Secondary | ICD-10-CM | POA: Diagnosis not present

## 2018-02-13 DIAGNOSIS — Z23 Encounter for immunization: Secondary | ICD-10-CM | POA: Diagnosis not present

## 2018-02-15 ENCOUNTER — Other Ambulatory Visit: Payer: Self-pay | Admitting: Internal Medicine

## 2018-02-24 ENCOUNTER — Other Ambulatory Visit: Payer: Self-pay | Admitting: Internal Medicine

## 2018-02-27 ENCOUNTER — Other Ambulatory Visit: Payer: Self-pay | Admitting: Internal Medicine

## 2018-04-16 ENCOUNTER — Ambulatory Visit: Payer: 59 | Admitting: Internal Medicine

## 2018-05-11 ENCOUNTER — Other Ambulatory Visit: Payer: Self-pay | Admitting: Internal Medicine

## 2018-05-11 DIAGNOSIS — E1165 Type 2 diabetes mellitus with hyperglycemia: Secondary | ICD-10-CM

## 2018-05-11 DIAGNOSIS — Z794 Long term (current) use of insulin: Principal | ICD-10-CM

## 2018-05-15 ENCOUNTER — Encounter: Payer: Self-pay | Admitting: Family Medicine

## 2018-05-15 ENCOUNTER — Ambulatory Visit: Payer: 59 | Admitting: Family Medicine

## 2018-05-15 VITALS — BP 126/70 | HR 84 | Temp 98.1°F | Ht 65.5 in | Wt 188.2 lb

## 2018-05-15 DIAGNOSIS — J209 Acute bronchitis, unspecified: Secondary | ICD-10-CM | POA: Insufficient documentation

## 2018-05-15 MED ORDER — GUAIFENESIN-CODEINE 100-10 MG/5ML PO SYRP: 5.0000 mL | ORAL_SOLUTION | Freq: Four times a day (QID) | ORAL | 0 refills | Status: DC | PRN

## 2018-05-15 MED ORDER — AZITHROMYCIN 250 MG PO TABS: ORAL_TABLET | ORAL | 0 refills | Status: DC

## 2018-05-15 MED ORDER — ALBUTEROL SULFATE HFA 108 (90 BASE) MCG/ACT IN AERS: 2.0000 | INHALATION_SPRAY | RESPIRATORY_TRACT | 0 refills | Status: DC | PRN

## 2018-05-15 MED ORDER — BENZONATATE 200 MG PO CAPS: 200.0000 mg | ORAL_CAPSULE | Freq: Three times a day (TID) | ORAL | 1 refills | Status: DC | PRN

## 2018-05-15 NOTE — Patient Instructions (Addendum)
For bronchitis Take zpak Drink lots of fluids Use inhaler for bad cough spells or wheezing   For cough mucinex dm- day  Tessalon -three times daily  guifen-codeine- at night   Update if not starting to improve in a week or if worsening

## 2018-05-15 NOTE — Progress Notes (Signed)
Subjective:    Patient ID: [name], female    DOB: 03-19-1956, 63 y.o.   MRN: [id number]  HPI Here for cough and congestion   Nose is bleeding a bit  R side   Coughing - bad  Loose phlegm-cannot get it up yet  No wheezing or sob   Nasal cong- d/c was clear with blood  Had a headache mon/tues -better now  A little pressure now   Started Friday night   (exp to grandkids last week= one was coughing)   Otc:  mucinex DM Delsym   Patient Active Problem List   Diagnosis Date Noted  . Acute bronchitis 05/15/2018  . Arthritis 12/09/2017  . BPV (benign positional vertigo) 03/12/2016  . Type 2 diabetes mellitus with hyperglycemia, with long-term current use of insulin ([place]) 04/27/2013  . Hypercholesteremia   . GERD (gastroesophageal reflux disease)    Past Medical History:  Diagnosis Date  . Blood transfusion    1984   [place]  . Chronic kidney disease    occasional renal stone  . Complication of anesthesia    woke during surgery (ablation) 2006?  . Diabetes mellitus    type 2 Diab- A1C-7  . GERD (gastroesophageal reflux disease)    occasional Protonix-will take protonix night before surgery  . Hypercholesteremia    on meds   Past Surgical History:  Procedure Laterality Date  . BLADDER SUSPENSION  12/08/2010   Procedure: TRANSVAGINAL TAPE (TVT) PROCEDURE;  Surgeon: Arloa Koh;  Location: [facility];  Service: Gynecology;  Laterality: N/A;  transvaginal tape with cystoscopy  . BLADDER SUSPENSION  01/30/2011   Procedure: TRANSVAGINAL TAPE (TVT) PROCEDURE;  Surgeon: Arloa Koh;  Location: [facility];  Service: Gynecology;  Laterality: N/A;  excision of excess sling from suprapubic incision and cystoscopy  . [place] SURGERY  2004  . CYSTOSCOPY  01/30/2011   Procedure: CYSTOSCOPY;  Surgeon: Arloa Koh;  Location: [facility];  Service: Gynecology;  Laterality: N/A;  . TONSILLECTOMY  1976  . transvaginal tape  8/12  . uterine ablation     Social History   Tobacco Use    . Smoking status: Former Smoker    Packs/day: 0.75    Years: 30.00    Pack years: 22.50    Types: Cigarettes  . Smokeless tobacco: Never Used  . Tobacco comment: recently quit 03/01/2015  Substance Use Topics  . Alcohol use: No    Alcohol/week: 0.0 standard drinks  . Drug use: No   Family History  Problem Relation Age of Onset  . Hypertension Mother   . Hyperlipidemia Mother   . Heart disease Mother   . Heart disease Father   . Hyperlipidemia Father   . Diabetes Father   . Heart attack Father   . Varicose Veins Father   . Peripheral vascular disease Father        amputation  . Cancer Paternal Aunt        lung/ brain   Allergies  Allergen Reactions  . Jardiance [Empagliflozin]     Myalgias   Current Outpatient Medications on File Prior to Visit  Medication Sig Dispense Refill  . B-D UF III MINI PEN NEEDLES 31G X 5 MM MISC USE TO INJECT INSULIN DAILY 100 each 5  . Calcium Carbonate-Vitamin D (CALTRATE 600+D PO) Take by mouth.    . diazepam (VALIUM) 2 MG tablet Take 2 mg by mouth.    . Dulaglutide (TRULICITY) 5.63 OV/5.6EP SOPN Inject 0.75 mg  in am weekly under skin 4 pen 1  . fenofibrate (TRICOR) 145 MG tablet Take 1 tablet (145 mg total) by mouth daily. 30 tablet 5  . fenofibrate (TRICOR) 145 MG tablet Take 1 tablet (145 mg total) by mouth daily. 30 tablet 10  . FREESTYLE LITE test strip USE TO TEST BLOOD SUGAR TWICE DAILY 200 each 1  . FREESTYLE LITE test strip USE TO TEST BLOOD SUGAR TWICE DAILY 200 each 1  . glipiZIDE (GLUCOTROL) 10 MG tablet TAKE 1 TABLET (10 MG TOTAL) BY MOUTH 2 (TWO) TIMES DAILY BEFORE A MEAL. 180 tablet 0  . INVOKANA 100 MG TABS tablet TAKE 1 TABLET (100 MG TOTAL) BY MOUTH DAILY. 30 tablet 5  . LIVALO 2 MG TABS TAKE 1 TABLET BY MOUTH EVERY DAY 30 tablet 10  . metFORMIN (GLUCOPHAGE) 1000 MG tablet TAKE 1 TABLET (1,000 MG TOTAL) BY MOUTH 2 (TWO) TIMES DAILY WITH A MEAL. 180 tablet 2  . Multiple Vitamins-Minerals (MULTIVITAMINS THER. W/MINERALS)  TABS Take 1 tablet by mouth daily.      . Omega-3 Fatty Acids (FISH OIL) 500 MG CAPS Take 500 mg by mouth daily.    Marland Kitchen OVER THE COUNTER MEDICATION Take 1 tablet by mouth 2 (two) times daily. PATIENT TAKES CINNAMON TABLETS 1000mg     . OVER THE COUNTER MEDICATION     . pantoprazole (PROTONIX) 40 MG tablet Take 1 tablet by mouth daily.  3  . sitaGLIPtin (JANUVIA) 100 MG tablet Take 1 tablet (100 mg total) by mouth daily. 30 tablet 1  . TOUJEO SOLOSTAR 300 UNIT/ML SOPN INJECT 40 UNITS INTO THE SKIN AT BEDTIME. NEED APPOINTMENT 5 pen 3   No current facility-administered medications on file prior to visit.     Review of Systems  Constitutional: Positive for appetite change and fatigue. Negative for chills and fever.  HENT: Positive for congestion, postnasal drip, rhinorrhea, sinus pressure, sneezing and sore throat. Negative for ear pain.   Eyes: Negative for pain and discharge.  Respiratory: Positive for cough. Negative for chest tightness, shortness of breath, wheezing and stridor.   Cardiovascular: Negative for chest pain.  Gastrointestinal: Negative for diarrhea, nausea and vomiting.  Genitourinary: Negative for frequency, hematuria and urgency.  Musculoskeletal: Negative for arthralgias and myalgias.  Skin: Negative for rash.  Neurological: Positive for headaches. Negative for dizziness, weakness and light-headedness.  Psychiatric/Behavioral: Negative for confusion and dysphoric mood.       Objective:   Physical Exam Constitutional:      General: She is not in acute distress.    Appearance: Normal appearance. She is well-developed. She is obese. She is not ill-appearing, toxic-appearing or diaphoretic.  HENT:     Head: Normocephalic and atraumatic.     Comments: Nares are injected and congested      Right Ear: Tympanic membrane, ear canal and external ear normal.     Left Ear: Tympanic membrane, ear canal and external ear normal.     Ears:     Comments: Dull TMs    Nose: Congestion  and rhinorrhea present.     Comments: Scab on septum of R nare   Nares are injected and congested      Mouth/Throat:     Mouth: Mucous membranes are moist.     Pharynx: Oropharynx is clear. No oropharyngeal exudate or posterior oropharyngeal erythema.     Comments: Clear pnd  Eyes:     General:        Right eye: No discharge.  Left eye: No discharge.     Conjunctiva/sclera: Conjunctivae normal.     Pupils: Pupils are equal, round, and reactive to light.  Neck:     Musculoskeletal: Normal range of motion and neck supple.  Cardiovascular:     Rate and Rhythm: Normal rate.     Heart sounds: Normal heart sounds.  Pulmonary:     Effort: Pulmonary effort is normal. No respiratory distress.     Breath sounds: Normal breath sounds. No stridor. No wheezing, rhonchi or rales.     Comments: Junky sounding cough  Upper airway sounds  No rales/rhonchi Scant wheeze on forced exp only Chest:     Chest [place]: No tenderness.  Lymphadenopathy:     Cervical: No cervical adenopathy.  Skin:    General: Skin is warm and dry.     Capillary Refill: Capillary refill takes less than 2 seconds.     Findings: No rash.  Neurological:     Mental Status: She is alert.     Cranial Nerves: No cranial nerve deficit.  Psychiatric:        Mood and Affect: Mood normal.           Assessment & Plan:   Problem List Items Addressed This Visit      Respiratory   Acute bronchitis - Primary    With worsening productive cough and some wheezing on exam  Given severity will cover with zpak Disc symptomatic care - see instructions on AVS  AVS: For bronchitis Take zpak Drink lots of fluids Use inhaler for bad cough spells or wheezing   For cough mucinex dm- day  Tessalon -three times daily  guifen-codeine- at night   Update if not starting to improve in a week or if worsening

## 2018-05-15 NOTE — Assessment & Plan Note (Signed)
With worsening productive cough and some wheezing on exam  Given severity will cover with zpak Disc symptomatic care - see instructions on AVS  AVS: For bronchitis Take zpak Drink lots of fluids Use inhaler for bad cough spells or wheezing   For cough mucinex dm- day  Tessalon -three times daily  guifen-codeine- at night   Update if not starting to improve in a week or if worsening

## 2018-05-21 ENCOUNTER — Telehealth: Payer: Self-pay

## 2018-05-21 MED ORDER — DAPAGLIFLOZIN PROPANEDIOL 10 MG PO TABS: 10.0000 mg | ORAL_TABLET | Freq: Every day | ORAL | 1 refills | Status: DC

## 2018-05-21 NOTE — Telephone Encounter (Signed)
Let's change to Farxiga 10, then

## 2018-05-21 NOTE — Telephone Encounter (Signed)
New RX sent.  Notified patient of message from Dr. Cruzita Lederer, patient expressed understanding and agreement. No further questions.

## 2018-05-21 NOTE — Telephone Encounter (Signed)
PA for Anastasio Auerbach has been denied due to lack of documentation patient has tired and failed [place].  Please advise.

## 2018-05-27 DIAGNOSIS — Z01419 Encounter for gynecological examination (general) (routine) without abnormal findings: Secondary | ICD-10-CM | POA: Diagnosis not present

## 2018-06-06 ENCOUNTER — Other Ambulatory Visit: Payer: Self-pay | Admitting: Internal Medicine

## 2018-06-13 DIAGNOSIS — R42 Dizziness and giddiness: Secondary | ICD-10-CM | POA: Diagnosis not present

## 2018-07-16 ENCOUNTER — Other Ambulatory Visit: Payer: Self-pay | Admitting: Internal Medicine

## 2018-07-18 ENCOUNTER — Ambulatory Visit: Payer: 59 | Admitting: Internal Medicine

## 2018-07-18 ENCOUNTER — Other Ambulatory Visit: Payer: Self-pay

## 2018-07-18 ENCOUNTER — Encounter: Payer: Self-pay | Admitting: Internal Medicine

## 2018-07-18 VITALS — BP 120/60 | HR 88 | Ht 65.5 in | Wt 189.0 lb

## 2018-07-18 DIAGNOSIS — Z794 Long term (current) use of insulin: Secondary | ICD-10-CM

## 2018-07-18 DIAGNOSIS — E782 Mixed hyperlipidemia: Secondary | ICD-10-CM | POA: Insufficient documentation

## 2018-07-18 DIAGNOSIS — E663 Overweight: Secondary | ICD-10-CM

## 2018-07-18 DIAGNOSIS — E785 Hyperlipidemia, unspecified: Secondary | ICD-10-CM | POA: Insufficient documentation

## 2018-07-18 DIAGNOSIS — E1165 Type 2 diabetes mellitus with hyperglycemia: Secondary | ICD-10-CM | POA: Diagnosis not present

## 2018-07-18 DIAGNOSIS — E1169 Type 2 diabetes mellitus with other specified complication: Secondary | ICD-10-CM | POA: Insufficient documentation

## 2018-07-18 LAB — POCT GLYCOSYLATED HEMOGLOBIN (HGB A1C): Hemoglobin A1C: 11.2 % — AB (ref 4.0–5.6)

## 2018-07-18 MED ORDER — INSULIN DEGLUDEC 200 UNIT/ML ~~LOC~~ SOPN: 40.0000 [IU] | PEN_INJECTOR | Freq: Every day | SUBCUTANEOUS | 3 refills | Status: DC

## 2018-07-18 MED ORDER — DULAGLUTIDE 0.75 MG/0.5ML ~~LOC~~ SOAJ: SUBCUTANEOUS | 1 refills | Status: DC

## 2018-07-18 NOTE — Addendum Note (Signed)
Addended by: Cardell Peach I on: 07/18/2018 04:04 PM   Modules accepted: Orders

## 2018-07-18 NOTE — Patient Instructions (Addendum)
Please  continue: - Glipizide 10 mg 20-30 minutes before breakfast and dinner - Metformin 1000 mg 2x a day, with meals  Please change: - Tresiba 40 units daily  Please start Trulicity 9.03 mg weekly. Let me know when you are close to running out to call in the higher dose to your pharmacy (1.5 mg).  After you take the second Trulicity dose, stop Januvia.  Please return in 3 months with your sugar log.

## 2018-07-18 NOTE — Progress Notes (Signed)
Patient ID: [name], female   DOB: 1956-03-20, 63 y.o.   MRN: [id number]   HPI: [name] is a 63 y.o.-year-old female, returning for f/u for DM2, dx in 2013, insulin-dependent since 2016, uncontrolled, without long term complications.  Her husband is also my patient.  Last visit 6 months ago.  Her husband was just dx'ed with liver cancer, stage 4.   Last hemoglobin A1c was: Lab Results  Component Value Date   HGBA1C 8.6 (H) 12/09/2017   HGBA1C 8.2 06/25/2017   HGBA1C 7.8 01/16/2017   Pt is on a regimen of: Please  continue: - Glipizide 10 mg 20-30 minutes before breakfast and dinner - Metformin  1000 mg 2x a day, with meals -  - stopped 1 week ago 2/2 mm cramps despite drinking a lot of water - Januvia 100 mg before b'fast - did not change to Trulicity as advised at last OV - Toujeo 40 units at bedtime.  Pt checks her sugars twice a day: - am: 105-125 >> 100-130 >> 120-125 >> 180 - 2h after b'fast: n/c >> 147, 164 >> n/c - before lunch: n/c >> 160 >> 120-130 >> n/c - 2h after lunch: n/c >> 151 >> n/c >> <150 >> n/c - before dinner: n/c >> 140, 142 >> n/c - 2h after dinner:  150s-200, 220 >> <200 >> 250-260 - bedtime: n/c >> 179  - nighttime: n/c Lowest sugar was 100 >> 110 >> 115; it is unclear at which level she has hypoglycemia awareness. Highest sugar was 220 >> 200 >> 269.  Glucometer: OneTouch Ultra  Pt's meals are: - Breakfast: grits, meat or cereals - Lunch: sandwich or bowl of cereal - Dinner: 2-3x a week eats out: chicken, steak, ribs - Snacks: , fruit No soda; drinks water with Crystal light.  -No CKD, last BUN/creatinine:  Lab Results  Component Value Date   BUN 16 12/09/2017   BUN 18 09/27/2016   CREATININE 1.10 12/09/2017   CREATININE 0.85 09/27/2016   -+ HL; last set of lipids: Lab Results  Component Value Date   CHOL 182 12/09/2017   HDL 51.70 12/09/2017   LDLCALC 96 03/24/2015   LDLDIRECT 107.0 12/09/2017   TRIG 216.0 (H) 12/09/2017   CHOLHDL 4 12/09/2017  On fish oil, fenofibrate, Livalo - last eye exam was in 09/2017: No DR -She denies numbness and tingling in her feet.  She had vertigo >> seeing specialist at [facility] >> changed from meclizine to Valium.  She has a microphone in the right ear which transmits to the left.   ROS: Constitutional: no weight gain/no weight loss, no fatigue, no subjective hyperthermia, no subjective hypothermia Eyes: no blurry vision, no xerophthalmia ENT: no sore throat, no nodules palpated in neck, no dysphagia, no odynophagia, no hoarseness Cardiovascular: no CP/no SOB/no palpitations/no leg swelling Respiratory: no cough/no SOB/no wheezing Gastrointestinal: no N/no V/no D/no C/no acid reflux Musculoskeletal: no muscle aches/no joint aches Skin: no rashes, no hair loss Neurological: no tremors/no numbness/no tingling/no dizziness  I reviewed pt's medications, allergies, PMH, social hx, family hx, and changes were documented in the history of present illness. Otherwise, unchanged from my initial visit note.  Past Medical History:  Diagnosis Date  . Blood transfusion    1984   [place]  . Chronic kidney disease    occasional renal stone  . Complication of anesthesia    woke during surgery (ablation) 2006?  . Diabetes mellitus    type 2 Diab- A1C-7  .  GERD (gastroesophageal reflux disease)    occasional Protonix-will take protonix night before surgery  . Hypercholesteremia    on meds   Past Surgical History:  Procedure Laterality Date  . BLADDER SUSPENSION  12/08/2010   Procedure: TRANSVAGINAL TAPE (TVT) PROCEDURE;  Surgeon: Arloa Koh;  Location: [facility];  Service: Gynecology;  Laterality: N/A;  transvaginal tape with cystoscopy  . BLADDER SUSPENSION  01/30/2011   Procedure: TRANSVAGINAL TAPE (TVT) PROCEDURE;  Surgeon: Arloa Koh;  Location: [facility];  Service: Gynecology;  Laterality: N/A;  excision of excess sling from suprapubic incision and cystoscopy  . [place] SURGERY   2004  . CYSTOSCOPY  01/30/2011   Procedure: CYSTOSCOPY;  Surgeon: Arloa Koh;  Location: [facility];  Service: Gynecology;  Laterality: N/A;  . TONSILLECTOMY  1976  . transvaginal tape  8/12  . uterine ablation     Social History   Social History  . Marital status: Married    Spouse name: N/A  . Number of children: 2   Occupational History  . retired   Social History Main Topics  . Smoking status: Former Smoker    Packs/day: 0.75    Years: 30.00    Types: Cigarettes  . Smokeless tobacco: Never Used     Comment: recently quit 03/01/2015  . Alcohol use No  . Drug use: No   Current Outpatient Medications on File Prior to Visit  Medication Sig Dispense Refill  . albuterol (PROVENTIL HFA;VENTOLIN HFA) 108 (90 Base) MCG/ACT inhaler Inhale 2 puffs into the lungs every 4 (four) hours as needed for wheezing. 1 Inhaler 0  . azithromycin (ZITHROMAX Z-PAK) 250 MG tablet Take 2 pills by mouth today and then 1 pill daily for 4 days 6 tablet 0  . B-D UF III MINI PEN NEEDLES 31G X 5 MM MISC USE TO INJECT INSULIN DAILY 100 each 5  . benzonatate (TESSALON) 200 MG capsule Take 1 capsule (200 mg total) by mouth 3 (three) times daily as needed. Do not bite pill 30 capsule 1  . Calcium Carbonate-Vitamin D (CALTRATE 600+D PO) Take by mouth.    . diazepam (VALIUM) 2 MG tablet Take 2 mg by mouth.    . Dulaglutide (TRULICITY) 9.98 PJ/8.2NK SOPN Inject 0.75 mg in am weekly under skin 4 pen 1  . FARXIGA 10 MG TABS tablet TAKE 1 TABLET BY MOUTH EVERY DAY 30 tablet 1  . fenofibrate (TRICOR) 145 MG tablet Take 1 tablet (145 mg total) by mouth daily. 30 tablet 10  . FREESTYLE LITE test strip USE TO TEST BLOOD SUGAR TWICE DAILY 200 each 1  . glipiZIDE (GLUCOTROL) 10 MG tablet TAKE 1 TABLET (10 MG TOTAL) BY MOUTH 2 (TWO) TIMES DAILY BEFORE A MEAL. 180 tablet 0  . guaiFENesin-codeine (ROBITUSSIN AC) 100-10 MG/5ML syrup Take 5 mLs by mouth 4 (four) times daily as needed for cough. 120 mL 0  . LIVALO 2 MG TABS  TAKE 1 TABLET BY MOUTH EVERY DAY 30 tablet 10  . metFORMIN (GLUCOPHAGE) 1000 MG tablet TAKE 1 TABLET (1,000 MG TOTAL) BY MOUTH 2 (TWO) TIMES DAILY WITH A MEAL. 180 tablet 2  . Multiple Vitamins-Minerals (MULTIVITAMINS THER. W/MINERALS) TABS Take 1 tablet by mouth daily.      . Omega-3 Fatty Acids (FISH OIL) 500 MG CAPS Take 500 mg by mouth daily.    Marland Kitchen OVER THE COUNTER MEDICATION Take 1 tablet by mouth 2 (two) times daily. PATIENT TAKES CINNAMON TABLETS 1000mg     . OVER  THE COUNTER MEDICATION     . pantoprazole (PROTONIX) 40 MG tablet Take 1 tablet by mouth daily.  3  . sitaGLIPtin (JANUVIA) 100 MG tablet Take 1 tablet (100 mg total) by mouth daily. 30 tablet 1  . TOUJEO SOLOSTAR 300 UNIT/ML SOPN INJECT 40 UNITS INTO THE SKIN AT BEDTIME. NEED APPOINTMENT. DAY SUPPLY PER INS 13.5 pen 1   No current facility-administered medications on file prior to visit.    Allergies  Allergen Reactions  . Jardiance [Empagliflozin]     Myalgias   Family History  Problem Relation Age of Onset  . Hypertension Mother   . Hyperlipidemia Mother   . Heart disease Mother   . Heart disease Father   . Hyperlipidemia Father   . Diabetes Father   . Heart attack Father   . Varicose Veins Father   . Peripheral vascular disease Father        amputation  . Cancer Paternal Aunt        lung/ brain    PE: BP 120/60   Pulse 88   Ht 5' 5.5" (1.664 m)   Wt 189 lb (85.7 kg)   SpO2 97%   BMI 30.97 kg/m  Wt Readings from Last 3 Encounters:  07/18/18 189 lb (85.7 kg)  05/15/18 188 lb 4 oz (85.4 kg)  01/13/18 186 lb (84.4 kg)   Constitutional: overweight, in NAD Eyes: PERRLA, EOMI, no exophthalmos ENT: moist mucous membranes, no thyromegaly, no cervical lymphadenopathy Cardiovascular: RRR, No MRG Respiratory: CTA B Gastrointestinal: abdomen soft, NT, ND, BS+ Musculoskeletal: no deformities, strength intact in all 4 Skin: moist, warm, no rashes Neurological: no tremor with outstretched hands, DTR normal in  all 4  ASSESSMENT: 1. DM2, insulin-dependent, uncontrolled, without long term complications, but with hyperglycemia She initially had muscle cramps and skin peeling from Invokana, not recently  2. Overweight  3. HL  PLAN:  1. Patient with longstanding, uncontrolled, type 2 diabetes, on oral antidiabetic regimen, long-acting insulin, and GLP-1 receptor agonist added at last visit.  At that time, after she relaxed her diet, her HbA1c increased to 8.6%.  Sugars were at goal in the morning but were increasing towards the end of the day.  They were higher after dinner as she was taking glipizide after finishing dinner.  We moved this before meals.  We also changed from Januvia to Trulicity.  Since then, per insurance preference, we changed from [place] to [place]. -At this visit, she tells me that she had to stop farxiga due to muscle cramps.  She drinks up to a gallon of water a day.  Therefore, will not restart the SGLT 2 inhibitor for now. -Unfortunately, she did not remember the instructions to start Trulicity, I suggested at last visit.  We will start this now.  Discussed about benefits and possible side effects.  We will start at a low dose and advance as tolerated. -Per insurance preference, will switch from [place] to [place].  We will keep the same dose for now. -We also discussed about the absolute need to improve her diet, by first cutting out snacks -  I suggested to:  Patient Instructions  Please  continue: - Glipizide 10 mg 20-30 minutes before breakfast and dinner - Metformin 1000 mg 2x a day, with meals  Please change: - Tresiba 40 units daily  Please start Trulicity 9.37 mg weekly. Let me know when you are close to running out to call in the higher dose to your pharmacy (1.5 mg).  After  you take the second Trulicity dose, stop Januvia.  Please return in 3 months with your sugar log.  - today, HbA1c is 11.2% (much higher) - continue checking sugars at different times of  the day - check 2x a day, rotating checks - advised for yearly eye exams >> she is UTD - Return to clinic in 3 mo with sugar log    2. Overweight -We will start today GLP-1 receptor agonist, which should also help with weight loss.  Unfortunately, we cannot continue SGLT 2 inhibitor due to muscle cramps.  3. HL - Reviewed latest lipid panel from 12/2017: LDL higher than target, triglycerides also high Lab Results  Component Value Date   CHOL 182 12/09/2017   HDL 51.70 12/09/2017   LDLCALC 96 03/24/2015   LDLDIRECT 107.0 12/09/2017   TRIG 216.0 (H) 12/09/2017   CHOLHDL 4 12/09/2017  - Continues Livalo, fenofibrate, fish oil without side effects.  Philemon Kingdom, MD PhD [facility]

## 2018-08-04 ENCOUNTER — Telehealth: Payer: Self-pay

## 2018-08-04 NOTE — Telephone Encounter (Signed)
PA submitted via [email or web address] for Livalo, awaiting response

## 2018-09-04 ENCOUNTER — Other Ambulatory Visit: Payer: Self-pay | Admitting: Internal Medicine

## 2018-09-05 ENCOUNTER — Other Ambulatory Visit: Payer: Self-pay | Admitting: Internal Medicine

## 2018-09-05 DIAGNOSIS — E785 Hyperlipidemia, unspecified: Secondary | ICD-10-CM

## 2018-09-22 DIAGNOSIS — Z1231 Encounter for screening mammogram for malignant neoplasm of breast: Secondary | ICD-10-CM | POA: Diagnosis not present

## 2018-10-14 ENCOUNTER — Other Ambulatory Visit: Payer: Self-pay

## 2018-10-16 ENCOUNTER — Other Ambulatory Visit: Payer: Self-pay

## 2018-10-16 ENCOUNTER — Encounter: Payer: Self-pay | Admitting: Internal Medicine

## 2018-10-16 ENCOUNTER — Ambulatory Visit: Payer: 59 | Admitting: Internal Medicine

## 2018-10-16 DIAGNOSIS — E1165 Type 2 diabetes mellitus with hyperglycemia: Secondary | ICD-10-CM

## 2018-10-16 DIAGNOSIS — Z794 Long term (current) use of insulin: Secondary | ICD-10-CM

## 2018-10-16 LAB — POCT GLYCOSYLATED HEMOGLOBIN (HGB A1C): Hemoglobin A1C: 9.6 % — AB (ref 4.0–5.6)

## 2018-10-16 MED ORDER — TRESIBA FLEXTOUCH 200 UNIT/ML ~~LOC~~ SOPN: 40.0000 [IU] | PEN_INJECTOR | Freq: Every day | SUBCUTANEOUS | 3 refills | Status: DC

## 2018-10-16 MED ORDER — ONETOUCH VERIO W/DEVICE KIT: PACK | Status: DC

## 2018-10-16 MED ORDER — ONETOUCH VERIO VI STRP: ORAL_STRIP | 12 refills | Status: DC

## 2018-10-16 MED ORDER — GLIPIZIDE 10 MG PO TABS: 10.0000 mg | ORAL_TABLET | Freq: Two times a day (BID) | ORAL | 3 refills | Status: DC

## 2018-10-16 NOTE — Addendum Note (Signed)
Addended by: Cardell Peach I on: 10/16/2018 02:09 PM   Modules accepted: Orders

## 2018-10-16 NOTE — Progress Notes (Signed)
Patient ID: [name], female   DOB: 10-17-1955, 63 y.o.   MRN: [id number]   HPI: [name] is a 63 y.o.-year-old female, returning for f/u for DM2, dx in 2013, insulin-dependent since 2016, uncontrolled, without long term complications.  Her husband is also my patient.  Last visit 3 months ago.  Last hemoglobin A1c was: Lab Results  Component Value Date   HGBA1C 11.2 (A) 07/18/2018   HGBA1C 8.6 (H) 12/09/2017   HGBA1C 8.2 06/25/2017   Pt is on a regimen of: -  stopped since last OV - ? - Metformin 1000 mg 2x a day, with meals - Trulicity 1.5 mg weekly - Toujeo  40 units at bedtime -did not change as suggested at last visit - ?  Pt checks her sugars 4x a day: - am: 100-130 >> 120-125 >> 180 >> 151-281, 313 - 2h after b'fast: n/c >> 147, 164 >> n/c >> 189-312 - before lunch: n/c >> 160 >> 120-130 >> n/c - 2h after lunch: n/c >> 151 >> n/c >> <150 >> n/c - before dinner: n/c >> 140, 142 >> n/c >> 147-191, 212 - 2h after dinner:  <200 >> 250-260 >> 187-214, 256 - bedtime: n/c >> 179  - nighttime: n/c Lowest sugar was 115 >> 151; it is unclear at which level she has hypoglycemia awareness. Highest sugar was 269 >> 256.  Glucometer: OneTouch Ultra >> One T Verio Flex  Pt's meals are: - Breakfast: grits, meat or cereals - Lunch: sandwich or bowl of cereal - Dinner: 2-3x a week eats out: chicken, steak, ribs - Snacks: , fruit No sodas; drinks water with Crystal light.  -No CKD, last BUN/creatinine:  Lab Results  Component Value Date   BUN 16 12/09/2017   BUN 18 09/27/2016   CREATININE 1.10 12/09/2017   CREATININE 0.85 09/27/2016   -+ HL; last set of lipids: Lab Results  Component Value Date   CHOL 182 12/09/2017   HDL 51.70 12/09/2017   LDLCALC 96 03/24/2015   LDLDIRECT 107.0 12/09/2017   TRIG 216.0 (H) 12/09/2017   CHOLHDL 4 12/09/2017  On Livalo, fish oil, fenofibrate. - last eye exam was in 09/2017: No DR - no numbness and tingling in her feet.  She had  vertigo >> seeing specialist at [facility] >> changed from meclizine to Valium.  She has a microphone in the right ear which transmits to the left.   Before last visit, her husband was just dx'ed with liver cancer, stage 4. He is doing ChTx.  ROS: Constitutional: no weight gain/no weight loss, no fatigue, no subjective hyperthermia, no subjective hypothermia Eyes: no blurry vision, no xerophthalmia ENT: no sore throat, no nodules palpated in neck, no dysphagia, no odynophagia, no hoarseness Cardiovascular: no CP/no SOB/no palpitations/no leg swelling Respiratory: no cough/no SOB/no wheezing Gastrointestinal: no N/no V/no D/no C/no acid reflux Musculoskeletal: no muscle aches/no joint aches Skin: no rashes, no hair loss Neurological: no tremors/no numbness/no tingling/no dizziness  I reviewed pt's medications, allergies, PMH, social hx, family hx, and changes were documented in the history of present illness. Otherwise, unchanged from my initial visit note.  Past Medical History:  Diagnosis Date  . Blood transfusion    1984   [place]  . Chronic kidney disease    occasional renal stone  . Complication of anesthesia    woke during surgery (ablation) 2006?  . Diabetes mellitus    type 2 Diab- A1C-7  . GERD (gastroesophageal reflux disease)    occasional  Protonix-will take protonix night before surgery  . Hypercholesteremia    on meds   Past Surgical History:  Procedure Laterality Date  . BLADDER SUSPENSION  12/08/2010   Procedure: TRANSVAGINAL TAPE (TVT) PROCEDURE;  Surgeon: Arloa Koh;  Location: [facility];  Service: Gynecology;  Laterality: N/A;  transvaginal tape with cystoscopy  . BLADDER SUSPENSION  01/30/2011   Procedure: TRANSVAGINAL TAPE (TVT) PROCEDURE;  Surgeon: Arloa Koh;  Location: [facility];  Service: Gynecology;  Laterality: N/A;  excision of excess sling from suprapubic incision and cystoscopy  . [place] SURGERY  2004  . CYSTOSCOPY  01/30/2011   Procedure: CYSTOSCOPY;   Surgeon: Arloa Koh;  Location: [facility];  Service: Gynecology;  Laterality: N/A;  . TONSILLECTOMY  1976  . transvaginal tape  8/12  . uterine ablation     Social History   Social History  . Marital status: Married    Spouse name: N/A  . Number of children: 2   Occupational History  . retired   Social History Main Topics  . Smoking status: Former Smoker    Packs/day: 0.75    Years: 30.00    Types: Cigarettes  . Smokeless tobacco: Never Used     Comment: recently quit 03/01/2015  . Alcohol use No  . Drug use: No   Current Outpatient Medications on File Prior to Visit  Medication Sig Dispense Refill  . albuterol (PROVENTIL HFA;VENTOLIN HFA) 108 (90 Base) MCG/ACT inhaler Inhale 2 puffs into the lungs every 4 (four) hours as needed for wheezing. 1 Inhaler 0  . B-D UF III MINI PEN NEEDLES 31G X 5 MM MISC USE TO INJECT INSULIN DAILY 100 each 5  . Calcium Carbonate-Vitamin D (CALTRATE 600+D PO) Take by mouth.    . diazepam (VALIUM) 2 MG tablet Take 2 mg by mouth.    . fenofibrate (TRICOR) 145 MG tablet TAKE 1 TABLET BY MOUTH EVERY DAY 90 tablet 0  . FREESTYLE LITE test strip USE TO TEST BLOOD SUGAR TWICE DAILY 200 each 1  . glipiZIDE (GLUCOTROL) 10 MG tablet TAKE 1 TABLET (10 MG TOTAL) BY MOUTH 2 (TWO) TIMES DAILY BEFORE A MEAL. 180 tablet 0  . Insulin Degludec (TRESIBA FLEXTOUCH) 200 UNIT/ML SOPN Inject 40 Units into the skin daily. 6 pen 3  . LIVALO 2 MG TABS TAKE 1 TABLET BY MOUTH EVERY DAY 30 tablet 10  . metFORMIN (GLUCOPHAGE) 1000 MG tablet TAKE 1 TABLET (1,000 MG TOTAL) BY MOUTH 2 (TWO) TIMES DAILY WITH A MEAL. 180 tablet 2  . Multiple Vitamins-Minerals (MULTIVITAMINS THER. W/MINERALS) TABS Take 1 tablet by mouth daily.      . Omega-3 Fatty Acids (FISH OIL) 500 MG CAPS Take 500 mg by mouth daily.    Marland Kitchen OVER THE COUNTER MEDICATION Take 1 tablet by mouth 2 (two) times daily. PATIENT TAKES CINNAMON TABLETS 1000mg     . OVER THE COUNTER MEDICATION     . pantoprazole (PROTONIX) 40  MG tablet Take 1 tablet by mouth daily.  3  . sitaGLIPtin (JANUVIA) 100 MG tablet Take 1 tablet (100 mg total) by mouth daily. 30 tablet 1  . TOUJEO SOLOSTAR 300 UNIT/ML SOPN INJECT 40 UNITS INTO THE SKIN AT BEDTIME. NEED APPOINTMENT. DAY SUPPLY PER INS 13.5 pen 1  . TRULICITY 6.62 HU/7.6LY SOPN INJECT 0.75MG  (1 PEN) ONCE A WEEK IN THE MORNING UNDER THE SKIN 2 pen 1   No current facility-administered medications on file prior to visit.    Allergies  Allergen  Reactions  . Jardiance [Empagliflozin]     Myalgias   Family History  Problem Relation Age of Onset  . Hypertension Mother   . Hyperlipidemia Mother   . Heart disease Mother   . Heart disease Father   . Hyperlipidemia Father   . Diabetes Father   . Heart attack Father   . Varicose Veins Father   . Peripheral vascular disease Father        amputation  . Cancer Paternal Aunt        lung/ brain    PE: BP 120/82   Pulse 67   Ht 5' 5.5" (1.664 m)   Wt 187 lb (84.8 kg)   SpO2 98%   BMI 30.65 kg/m  Wt Readings from Last 3 Encounters:  10/16/18 187 lb (84.8 kg)  07/18/18 189 lb (85.7 kg)  05/15/18 188 lb 4 oz (85.4 kg)   Constitutional: overweight, in NAD Eyes: PERRLA, EOMI, no exophthalmos ENT: moist mucous membranes, no thyromegaly, no cervical lymphadenopathy Cardiovascular: RRR, No MRG Respiratory: CTA B Gastrointestinal: abdomen soft, NT, ND, BS+ Musculoskeletal: no deformities, strength intact in all 4 Skin: moist, warm, no rashes Neurological: no tremor with outstretched hands, DTR normal in all 4  ASSESSMENT: 1. DM2, insulin-dependent, uncontrolled, without long term complications, but with hyperglycemia She initially had muscle cramps and skin peeling from Invokana, then, she again had muscle cramps with [place].  2. Overweight  3. HL  PLAN:  1. Patient with longstanding, uncontrolled, type 2 diabetes, on oral antidiabetic regimen, and also long-acting insulin and now GLP-1 receptor agonist.  Her HbA1c  was much higher at last visit, at 11.2% so at that time we started Trulicity and switched to [place].  She was on Farxiga in the past but she had to stop due to muscle aches.  We discussed at last visit about the absolute need to cut out snacks and improve her diet. -At this visit, sugars are slightly better, but still above target and with spikes in the 300s. Upon questioning, she tells me that she is not on glipizide as she was not given a prescription from the pharmacy.  She thought she was supposed to be off the medication.  I advised her to always look at the after visit summary, which we usually review at the time of the visit and that when she gets a printed copy.  We will restart the glipizide now but I am hoping that we can stop this in the future after her sugars improve.  Also, she did not remember that I advised her to switch from [place] to [place]... I again sent this to the pharmacy and advised her to try to make the switch, since [place] has a better pharmacodynamics and is conducive to less fluctuating CBGs compared to Toujeo -  I suggested to:  Patient Instructions  Please  restart: - Glipizide 10 mg 20-30 minutes before breakfast and dinner  Try to change from Toujeo to: - Tresiba 40 units daily  Please continue: - Metformin 1000 mg 2x a day, with meals - Trulicity 1.5 mg weekly  Please return in 3 months with your sugar log.  - today, HbA1c is 9.6% (better, but still high) - continue checking sugars at different times of the day - check 2x a day, rotating checks - advised for yearly eye exams >> she is not UTD - Return to clinic in 3 mo with sugar log    2. Overweight -No significant weight loss since last visit -Continue  GLP-1 receptor agonist, which should also help with weight loss -Unfortunately, we cannot continue SGLT 2 inhibitor due to muscle cramps.  3. HL - Reviewed latest lipid panel from 12/2017: LDL higher than target, triglycerides high Lab Results   Component Value Date   CHOL 182 12/09/2017   HDL 51.70 12/09/2017   LDLCALC 96 03/24/2015   LDLDIRECT 107.0 12/09/2017   TRIG 216.0 (H) 12/09/2017   CHOLHDL 4 12/09/2017  - Continues Livalo, fenofibrate, fish oil without side effects.  Philemon Kingdom, MD PhD [facility] Endocrin[facility]

## 2018-10-16 NOTE — Patient Instructions (Addendum)
Please  restart: - Glipizide 10 mg 20-30 minutes before breakfast and dinner  Try to change from Toujeo to: - Tresiba 40 units daily  Please continue: - Metformin 1000 mg 2x a day, with meals - Trulicity 1.5 mg weekly  Please return in 3 months with your sugar log.

## 2018-11-03 ENCOUNTER — Other Ambulatory Visit: Payer: Self-pay | Admitting: Internal Medicine

## 2018-12-01 ENCOUNTER — Other Ambulatory Visit: Payer: Self-pay | Admitting: Internal Medicine

## 2018-12-01 DIAGNOSIS — E785 Hyperlipidemia, unspecified: Secondary | ICD-10-CM

## 2018-12-04 ENCOUNTER — Other Ambulatory Visit: Payer: Self-pay | Admitting: Internal Medicine

## 2018-12-15 ENCOUNTER — Other Ambulatory Visit: Payer: Self-pay

## 2018-12-15 ENCOUNTER — Ambulatory Visit (INDEPENDENT_AMBULATORY_CARE_PROVIDER_SITE_OTHER): Payer: 59 | Admitting: Internal Medicine

## 2018-12-15 ENCOUNTER — Other Ambulatory Visit (INDEPENDENT_AMBULATORY_CARE_PROVIDER_SITE_OTHER): Payer: 59

## 2018-12-15 ENCOUNTER — Encounter: Payer: Self-pay | Admitting: Internal Medicine

## 2018-12-15 VITALS — BP 120/78 | HR 71 | Temp 97.9°F | Ht 65.5 in | Wt 189.8 lb

## 2018-12-15 DIAGNOSIS — Z0000 Encounter for general adult medical examination without abnormal findings: Secondary | ICD-10-CM | POA: Diagnosis not present

## 2018-12-15 DIAGNOSIS — R7309 Other abnormal glucose: Secondary | ICD-10-CM

## 2018-12-15 DIAGNOSIS — E782 Mixed hyperlipidemia: Secondary | ICD-10-CM

## 2018-12-15 DIAGNOSIS — H8113 Benign paroxysmal vertigo, bilateral: Secondary | ICD-10-CM

## 2018-12-15 DIAGNOSIS — M1990 Unspecified osteoarthritis, unspecified site: Secondary | ICD-10-CM

## 2018-12-15 DIAGNOSIS — E1165 Type 2 diabetes mellitus with hyperglycemia: Secondary | ICD-10-CM

## 2018-12-15 DIAGNOSIS — Z794 Long term (current) use of insulin: Secondary | ICD-10-CM

## 2018-12-15 DIAGNOSIS — E785 Hyperlipidemia, unspecified: Secondary | ICD-10-CM

## 2018-12-15 DIAGNOSIS — K219 Gastro-esophageal reflux disease without esophagitis: Secondary | ICD-10-CM

## 2018-12-15 LAB — COMPREHENSIVE METABOLIC PANEL
ALT: 24 U/L (ref 0–35)
AST: 15 U/L (ref 0–37)
Albumin: 4.5 g/dL (ref 3.5–5.2)
Alkaline Phosphatase: 53 U/L (ref 39–117)
BUN: 20 mg/dL (ref 6–23)
CO2: 25 mEq/L (ref 19–32)
Calcium: 9.5 mg/dL (ref 8.4–10.5)
Chloride: 102 mEq/L (ref 96–112)
Creatinine, Ser: 0.75 mg/dL (ref 0.40–1.20)
GFR: 78.6 mL/min (ref >60.00)
Glucose, Bld: 168 mg/dL — ABNORMAL HIGH (ref 70–99)
Potassium: 3.9 mEq/L (ref 3.5–5.1)
Sodium: 138 mEq/L (ref 135–145)
Total Bilirubin: 0.4 mg/dL (ref 0.2–1.2)
Total Protein: 7.3 g/dL (ref 6.0–8.3)

## 2018-12-15 LAB — HEMOGLOBIN A1C: Hgb A1c MFr Bld: 8.7 % — ABNORMAL HIGH (ref 4.6–6.5)

## 2018-12-15 LAB — MICROALBUMIN / CREATININE URINE RATIO
Creatinine,U: 61.2 mg/dL
Microalb Creat Ratio: 1.3 mg/g (ref 0.0–30.0)
Microalb, Ur: 0.8 mg/dL (ref 0.0–1.9)

## 2018-12-15 LAB — CBC
HCT: 39.7 % (ref 36.0–46.0)
Hemoglobin: 13.2 g/dL (ref 12.0–15.0)
MCHC: 33.1 g/dL (ref 30.0–36.0)
MCV: 87.6 fl (ref 78.0–100.0)
Platelets: 291 10*3/uL (ref 150.0–400.0)
RBC: 4.54 Mil/uL (ref 3.87–5.11)
RDW: 13 % (ref 11.5–15.5)
WBC: 6.8 10*3/uL (ref 4.0–10.5)

## 2018-12-15 LAB — VITAMIN D 25 HYDROXY (VIT D DEFICIENCY, FRACTURES): VITD: 36.29 ng/mL (ref 30.00–100.00)

## 2018-12-15 LAB — LIPID PANEL
Cholesterol: 238 mg/dL — ABNORMAL HIGH (ref 0–200)
HDL: 48.8 mg/dL (ref >39.00)
NonHDL: 189.59
Total CHOL/HDL Ratio: 5
Triglycerides: 290 mg/dL — ABNORMAL HIGH (ref 0.0–149.0)
VLDL: 58 mg/dL — ABNORMAL HIGH (ref 0.0–40.0)

## 2018-12-15 LAB — LDL CHOLESTEROL, DIRECT: Direct LDL: 148 mg/dL

## 2018-12-15 MED ORDER — FENOFIBRATE 145 MG PO TABS: 145.0000 mg | ORAL_TABLET | Freq: Every day | ORAL | 3 refills | Status: DC

## 2018-12-15 MED ORDER — PANTOPRAZOLE SODIUM 40 MG PO TBEC: 40.0000 mg | DELAYED_RELEASE_TABLET | Freq: Every day | ORAL | 3 refills | Status: DC

## 2018-12-15 NOTE — Assessment & Plan Note (Signed)
CMET and Lipid profile today Encouraged her to consume a low fat diet Continue Fenofibrate and Fish Oil, Fenofibrate refilled today If elevated, will get in touch with Dr. Renne Crigler re: [name] prescription that insurance is not covering

## 2018-12-15 NOTE — Assessment & Plan Note (Signed)
Continue Valium prn She will continue to follow with ENT

## 2018-12-15 NOTE — Patient Instructions (Signed)

## 2018-12-15 NOTE — Assessment & Plan Note (Signed)
Discussed avoiding spicy foods Discussed how weight loss could help improve reflux Continue Pantoprazole for now, refilled CBC and CMET today

## 2018-12-15 NOTE — Assessment & Plan Note (Signed)
Encouraged regular physical activity Ok to use NSAID's prn for joint pain, avoid daily use CMET today

## 2018-12-15 NOTE — Assessment & Plan Note (Signed)
Too early for A1C Will check CMET, Lipid and urine microalbumin today Encouraged her to consume a low carb diet Encouraged exercise for weight loss Advised her to start monitoring her sugars at least daily if she can Foot exam today Encouraged flu shot in the fall Pneumovax UTD Eye exam scheduled, advised her to have them send me and Dr.Gherge a report of her DM eye exam

## 2018-12-15 NOTE — Progress Notes (Signed)
Subjective:    Patient ID: [name], female    DOB: [birth year], 62 y.o.   MRN: [id number]  HPI  Pt presents to the clinic today for her annual exam. She is also due to follow up chronic conditions.  BPPV: Intermittent. She takes Valium as needed. She does follow with ENT.  OA: Mainly in her hands and shoulders. She does not take any medication routinely for this.  GERD: Triggered by spicy foods. She denies breakthrough on Omeprazole. There is no upper GI on file.  HLD: Her last LDL was 107, 12/2017. She is taking Fenofibrate. She is prescribed Livalo, but has not been able to get it approved through insurance. She is taking Fish Oil OTC as well. She tries to consume a low fat diet.   DM 2: Her last A1C was 9. 6, 10/2018. She is not checking her sugars as she is too busy caring for her terminally ill husband. She is taking Glipizide, Metformin, Trulicity, [place] as prescribed. She checks her feet routinely. She follows with Dr. Renne Crigler.  Flu: 02/2018 Tetanus: 11/2011 Pneumovax: 07/2016 Zostovax: 07/2012 Shingrix: never Mammogram: 09/2018, [facility] Pap Smear: 02/2017 Colon Screening: 05/2013 Vision Screening: scheduled for 01/2019 Dentist: quarterly  Diet: She does eat meat. She consumes fruits and veggies daily. She tries to avoid fried foods. She drinks mostly water. Exercise: None  Review of Systems      Past Medical History:  Diagnosis Date   Blood transfusion    1984   [facility]   Chronic kidney disease    occasional renal stone   Complication of anesthesia    woke during surgery (ablation) 2006?   Diabetes mellitus    type 2 Diab- A1C-7   GERD (gastroesophageal reflux disease)    occasional Protonix-will take protonix night before surgery   Hypercholesteremia    on meds    Current Outpatient Medications  Medication Sig Dispense Refill   albuterol (PROVENTIL HFA;VENTOLIN HFA) 108 (90 Base) MCG/ACT inhaler Inhale 2 puffs into the lungs every 4 (four)  hours as needed for wheezing. 1 Inhaler 0   B-D UF III MINI PEN NEEDLES 31G X 5 MM MISC USE TO INJECT INSULIN DAILY 100 each 5   Blood Glucose Monitoring Suppl (ONETOUCH VERIO) w/Device KIT Use to check blood sugar 2 times a day.     Calcium Carbonate-Vitamin D (CALTRATE 600+D PO) Take by mouth.     diazepam (VALIUM) 2 MG tablet Take 2 mg by mouth.     FARXIGA 10 MG TABS tablet TAKE 1 TABLET BY MOUTH EVERY DAY 30 tablet 1   fenofibrate (TRICOR) 145 MG tablet TAKE 1 TABLET BY MOUTH EVERY DAY 90 tablet 0   glipiZIDE (GLUCOTROL) 10 MG tablet Take 1 tablet (10 mg total) by mouth 2 (two) times daily before a meal. 180 tablet 3   glucose blood (ONETOUCH VERIO) test strip Use to check blood sugar 2 times a day. 200 each 12   Insulin Degludec (TRESIBA FLEXTOUCH) 200 UNIT/ML SOPN Inject 40 Units into the skin daily. 6 pen 3   LIVALO 2 MG TABS TAKE 1 TABLET BY MOUTH EVERY DAY 30 tablet 10   metFORMIN (GLUCOPHAGE) 1000 MG tablet TAKE 1 TABLET (1,000 MG TOTAL) BY MOUTH 2 (TWO) TIMES DAILY WITH A MEAL. 180 tablet 2   Multiple Vitamins-Minerals (MULTIVITAMINS THER. W/MINERALS) TABS Take 1 tablet by mouth daily.       Omega-3 Fatty Acids (FISH OIL) 500 MG CAPS Take 500 mg by  mouth daily.     OVER THE COUNTER MEDICATION Take 1 tablet by mouth 2 (two) times daily. PATIENT TAKES CINNAMON TABLETS 1054m     OVER THE COUNTER MEDICATION      pantoprazole (PROTONIX) 40 MG tablet Take 1 tablet by mouth daily.  3   TRULICITY 08.45MXM/4.6OESOPN INJECT 0.75MG (1 PEN) ONCE A WEEK IN THE MORNING UNDER THE SKIN 4 pen 2   No current facility-administered medications for this visit.     Allergies  Allergen Reactions   Jardiance [Empagliflozin]     Myalgias    Family History  Problem Relation Age of Onset   Hypertension Mother    Hyperlipidemia Mother    Heart disease Mother    Heart disease Father    Hyperlipidemia Father    Diabetes Father    Heart attack Father    Varicose Veins  Father    Peripheral vascular disease Father        amputation   Cancer Paternal Aunt        lung/ brain    Social History   Socioeconomic History   Marital status: Married    Spouse name: Not on file   Number of children: Not on file   Years of education: Not on file   Highest education level: Not on file  Occupational History   Not on file  Social Needs   Financial resource strain: Not on file   Food insecurity    Worry: Not on file    Inability: Not on file   Transportation needs    Medical: Not on file    Non-medical: Not on file  Tobacco Use   Smoking status: Former Smoker    Packs/day: 0.75    Years: 30.00    Pack years: 22.50    Types: Cigarettes   Smokeless tobacco: Never Used   Tobacco comment: recently quit 03/01/2015  Substance and Sexual Activity   Alcohol use: No    Alcohol/week: 0.0 standard drinks   Drug use: No   Sexual activity: Yes  Lifestyle   Physical activity    Days per week: Not on file    Minutes per session: Not on file   Stress: Not on file  Relationships   Social connections    Talks on phone: Not on file    Gets together: Not on file    Attends religious service: Not on file    Active member of club or organization: Not on file    Attends meetings of clubs or organizations: Not on file    Relationship status: Not on file   Intimate partner violence    Fear of current or ex partner: Not on file    Emotionally abused: Not on file    Physically abused: Not on file    Forced sexual activity: Not on file  Other Topics Concern   Not on file  Social History Narrative   Not on file     Constitutional: Denies fever, malaise, fatigue, headache or abrupt weight changes.  HEENT: Denies eye pain, eye redness, ear pain, ringing in the ears, wax buildup, runny nose, nasal congestion, bloody nose, or sore throat. Respiratory: Denies difficulty breathing, shortness of breath, cough or sputum production.     Cardiovascular: Denies chest pain, chest tightness, palpitations or swelling in the hands or feet.  Gastrointestinal: Pt reports constipation. Denies abdominal pain, bloating, diarrhea or blood in the stool.  GU: Denies urgency, frequency, pain with urination, burning sensation, blood  in urine, odor or discharge. Musculoskeletal: Pt reports intermittent joint pain. Denies decrease in range of motion, difficulty with gait, muscle pain or joint swelling.  Skin: Denies redness, rashes, lesions or ulcercations.  Neurological: Denies dizziness, difficulty with memory, difficulty with speech or problems with balance and coordination.  Psych: Denies anxiety, depression, SI/HI.  No other specific complaints in a complete review of systems (except as listed in HPI above).  Objective:   Physical Exam  BP 120/78    Pulse 71    Temp 97.9 F (36.6 C) (Temporal)    Ht 5' 5.5" (1.664 m)    Wt 189 lb 12 oz (86.1 kg)    SpO2 97%    BMI 31.10 kg/m  Wt Readings from Last 3 Encounters:  12/15/18 189 lb 12 oz (86.1 kg)  10/16/18 187 lb (84.8 kg)  07/18/18 189 lb (85.7 kg)    General: Appears her stated age, obese, in NAD. Skin: Warm, dry and intact. No  ulcerations noted. HEENT: Head: normal shape and size; Eyes: sclera white, no icterus, conjunctiva pink, PERRLA and EOMs intact; Ears: Tm's gray and intact, normal light reflex;  Neck:  Neck supple, trachea midline. No masses, lumps or thyromegaly present.  Cardiovascular: Normal rate and rhythm. S1,S2 noted.  No murmur, rubs or gallops noted. No JVD or BLE edema. No carotid bruits noted. Pulmonary/Chest: Normal effort and positive vesicular breath sounds. No respiratory distress. No wheezes, rales or ronchi noted.  Abdomen: Soft and nontender. Normal bowel sounds. No distention or masses noted. Liver, spleen and kidneys non palpable. Musculoskeletal: Strength 5/5 BUE/BLE. No difficulty with gait.  Neurological: Alert and oriented. Cranial nerves II-XII  grossly intact. Coordination normal.  Psychiatric: Mood and affect normal. Behavior is normal. Judgment and thought content normal.    BMET    Component Value Date/Time   NA 140 12/09/2017 1151   K 4.2 12/09/2017 1151   CL 104 12/09/2017 1151   CO2 29 12/09/2017 1151   GLUCOSE 226 (H) 12/09/2017 1151   BUN 16 12/09/2017 1151   CREATININE 1.10 12/09/2017 1151   CREATININE 0.85 09/27/2016 0854   CALCIUM 9.6 12/09/2017 1151   GFRNONAA 76 09/27/2016 0854   GFRAA 87 09/27/2016 0854    Lipid Panel     Component Value Date/Time   CHOL 182 12/09/2017 1151   TRIG 216.0 (H) 12/09/2017 1151   HDL 51.70 12/09/2017 1151   CHOLHDL 4 12/09/2017 1151   VLDL 43.2 (H) 12/09/2017 1151   LDLCALC 96 03/24/2015 1524    CBC    Component Value Date/Time   WBC 7.4 12/09/2017 1151   RBC 4.58 12/09/2017 1151   HGB 13.2 12/09/2017 1151   HCT 39.7 12/09/2017 1151   PLT 249.0 12/09/2017 1151   MCV 86.8 12/09/2017 1151   MCH 29.2 01/07/2014 1402   MCHC 33.3 12/09/2017 1151   RDW 12.7 12/09/2017 1151   LYMPHSABS 3.1 01/07/2014 1402   MONOABS 0.6 01/07/2014 1402   EOSABS 0.1 01/07/2014 1402   BASOSABS 0.0 01/07/2014 1402    Hgb A1C Lab Results  Component Value Date   HGBA1C 9.6 (A) 10/16/2018            Assessment & Plan:   Preventative Health Maintenance:  Encouraged her to get a flu shot in the fall Tetanus, pneumovax, zostovax UTD Discussed shingrix, she will call her insurance company about this Pap smear UTD Mammogram UTD, will request copy from [facility] Colon screening UTD Encouraged her to consume a balanced diet  and exercise regimen Advised her to see an eye doctor and dentist annually  Will check CBC, CMET, Lipid, urine microalbumin and Vit D. Too soon for A1C Webb Silversmith, NP

## 2018-12-19 MED ORDER — ROSUVASTATIN CALCIUM 10 MG PO TABS: 10.0000 mg | ORAL_TABLET | Freq: Every day | ORAL | 2 refills | Status: DC

## 2018-12-19 NOTE — Addendum Note (Signed)
Addended by: Lurlean Nanny on: 12/19/2018 04:23 PM   Modules accepted: Orders

## 2019-01-11 ENCOUNTER — Other Ambulatory Visit: Payer: Self-pay | Admitting: Internal Medicine

## 2019-01-11 DIAGNOSIS — E1165 Type 2 diabetes mellitus with hyperglycemia: Secondary | ICD-10-CM

## 2019-01-11 DIAGNOSIS — Z794 Long term (current) use of insulin: Secondary | ICD-10-CM

## 2019-01-21 ENCOUNTER — Other Ambulatory Visit: Payer: Self-pay

## 2019-01-21 LAB — HM DIABETES EYE EXAM

## 2019-01-23 ENCOUNTER — Encounter: Payer: Self-pay | Admitting: Internal Medicine

## 2019-01-23 ENCOUNTER — Ambulatory Visit (INDEPENDENT_AMBULATORY_CARE_PROVIDER_SITE_OTHER): Payer: 59 | Admitting: Internal Medicine

## 2019-01-23 ENCOUNTER — Other Ambulatory Visit: Payer: Self-pay

## 2019-01-23 VITALS — BP 120/68 | HR 82 | Ht 65.5 in | Wt 190.0 lb

## 2019-01-23 DIAGNOSIS — E663 Overweight: Secondary | ICD-10-CM

## 2019-01-23 DIAGNOSIS — E782 Mixed hyperlipidemia: Secondary | ICD-10-CM

## 2019-01-23 DIAGNOSIS — Z794 Long term (current) use of insulin: Secondary | ICD-10-CM

## 2019-01-23 DIAGNOSIS — E1165 Type 2 diabetes mellitus with hyperglycemia: Secondary | ICD-10-CM | POA: Diagnosis not present

## 2019-01-23 MED ORDER — TRESIBA FLEXTOUCH 200 UNIT/ML ~~LOC~~ SOPN: 46.0000 [IU] | PEN_INJECTOR | Freq: Every day | SUBCUTANEOUS | 3 refills | Status: DC

## 2019-01-23 NOTE — Patient Instructions (Addendum)
Please  continue: - Metformin 1000 mg 2x a day, with meals - Glipizide 10 mg 20-30 minutes before b'fast and dinner - Trulicity 1.5 mg weekly - Farxiga 10 mg before b'fast  Please increase: - Tresiba 44-46 units daily  Please return in 3-4 months with your sugar log.

## 2019-01-23 NOTE — Progress Notes (Signed)
Patient ID: [name], female   DOB: 1955-11-20, 63 y.o.   MRN: [id number]   HPI: [name] is a 63 y.o.-year-old female, returning for f/u for DM2, dx in 2013, insulin-dependent since 2016, uncontrolled, without long term complications.  Her husband is also my patient.  Last visit 3 months ago.  Last hemoglobin A1c was: Lab Results  Component Value Date   HGBA1C 8.7 (H) 12/15/2018   HGBA1C 9.6 (A) 10/16/2018   HGBA1C 11.2 (A) 07/18/2018   Pt is on a regimen of: - Metformin 1000 mg 2x a day, with meals - Glipizide 10 mg 20-30 minutes before breakfast and dinner-restarted 10/2018 - Farxiga 10 mg before b'fast - Trulicity 1.5 mg weekly - Tresiba 40 units daily-changed from [facility] 10/2018 She had mm cramps from Invokana.  Pt checks her sugars 4 times a day: - am: 100-130 >> 120-125 >> 180 >> 151-281, 313 >> 143-169 - 2h after b'fast: n/c >> 147, 164 >> n/c >> 189-312 >> n/c - before lunch: n/c >> 160 >> 120-130 >> n/c >> 151-179 - 2h after lunch: n/c >> 151 >> n/c >> <150 >> n/c - before dinner: n/c >> 140, 142 >> n/c >> 147-191, 212 >> n/c - 2h after dinner: 250-260 >> 187-214, 256 >> 143-183 (pizza) - bedtime: n/c >> 179  - nighttime: n/c Lowest sugar was 115 >> 147 >> 143; it is unclear at which level she has hypoglycemia awareness. Highest sugar was 256 >> 300s >> 183.  Glucometer: OneTouch Ultra >> One T Verio Flex  Pt's meals are: - Breakfast: grits, meat or cereals - Lunch: sandwich or bowl of cereal - Dinner: 2-3x a week eats out: chicken, steak, ribs - Snacks: , fruit No sodas.  She drinks water with Crystal light  -No CKD, last BUN/creatinine:  Lab Results  Component Value Date   BUN 20 12/15/2018   BUN 16 12/09/2017   CREATININE 0.75 12/15/2018   CREATININE 1.10 12/09/2017   -+ HL; last set of lipids: Lab Results  Component Value Date   CHOL 238 (H) 12/15/2018   HDL 48.80 12/15/2018   LDLCALC 96 03/24/2015   LDLDIRECT 148.0 12/15/2018   TRIG 290.0  (H) 12/15/2018   CHOLHDL 5 12/15/2018  On Crestor (changed from Livalo), fish oil, fenofibrate. - last eye exam was 01/21/2019: No DR - no numbness and tingling in her feet.  She had vertigo >> seeing specialist at [facility] >> changed from meclizine to Valium.  She has a microphone in the right ear which transmits to the left.   Her husband has liver cancer stage IV. He was doing ChTx, now on immunotherapy.  He is doing well.  ROS: Constitutional: no weight gain/no weight loss, no fatigue, no subjective hyperthermia, no subjective hypothermia Eyes: no blurry vision, no xerophthalmia ENT: no sore throat, no nodules palpated in neck, no dysphagia, no odynophagia, no hoarseness Cardiovascular: no CP/no SOB/no palpitations/no leg swelling Respiratory: no cough/no SOB/no wheezing Gastrointestinal: no N/no V/no D/no C/no acid reflux Musculoskeletal: no muscle aches/no joint aches Skin: no rashes, no hair loss Neurological: no tremors/no numbness/no tingling/no dizziness  I reviewed pt's medications, allergies, PMH, social hx, family hx, and changes were documented in the history of present illness. Otherwise, unchanged from my initial visit note.  Past Medical History:  Diagnosis Date  . Blood transfusion    1984   [place]  . Chronic kidney disease    occasional renal stone  . Complication of anesthesia  woke during surgery (ablation) 2006?  . Diabetes mellitus    type 2 Diab- A1C-7  . GERD (gastroesophageal reflux disease)    occasional Protonix-will take protonix night before surgery  . Hypercholesteremia    on meds   Past Surgical History:  Procedure Laterality Date  . BLADDER SUSPENSION  12/08/2010   Procedure: TRANSVAGINAL TAPE (TVT) PROCEDURE;  Surgeon: Arloa Koh;  Location: [facility];  Service: Gynecology;  Laterality: N/A;  transvaginal tape with cystoscopy  . BLADDER SUSPENSION  01/30/2011   Procedure: TRANSVAGINAL TAPE (TVT) PROCEDURE;  Surgeon: Arloa Koh;  Location: [facility];  Service: Gynecology;  Laterality: N/A;  excision of excess sling from suprapubic incision and cystoscopy  . [place] SURGERY  2004  . CYSTOSCOPY  01/30/2011   Procedure: CYSTOSCOPY;  Surgeon: Arloa Koh;  Location: [facility];  Service: Gynecology;  Laterality: N/A;  . TONSILLECTOMY  1976  . transvaginal tape  8/12  . uterine ablation     Social History   Social History  . Marital status: Married    Spouse name: N/A  . Number of children: 2   Occupational History  . retired   Social History Main Topics  . Smoking status: Former Smoker    Packs/day: 0.75    Years: 30.00    Types: Cigarettes  . Smokeless tobacco: Never Used     Comment: recently quit 03/01/2015  . Alcohol use No  . Drug use: No   Current Outpatient Medications on File Prior to Visit  Medication Sig Dispense Refill  . albuterol (PROVENTIL HFA;VENTOLIN HFA) 108 (90 Base) MCG/ACT inhaler Inhale 2 puffs into the lungs every 4 (four) hours as needed for wheezing. 1 Inhaler 0  . B-D UF III MINI PEN NEEDLES 31G X 5 MM MISC USE TO INJECT INSULIN DAILY 100 each 5  . Blood Glucose Monitoring Suppl (ONETOUCH VERIO) w/Device KIT Use to check blood sugar 2 times a day.    . Calcium Carbonate-Vitamin D (CALTRATE 600+D PO) Take by mouth.    . diazepam (VALIUM) 2 MG tablet Take 2 mg by mouth.    Marland Kitchen FARXIGA 10 MG TABS tablet TAKE 1 TABLET BY MOUTH EVERY DAY 30 tablet 1  . fenofibrate (TRICOR) 145 MG tablet Take 1 tablet (145 mg total) by mouth daily. 90 tablet 3  . glipiZIDE (GLUCOTROL) 10 MG tablet Take 1 tablet (10 mg total) by mouth 2 (two) times daily before a meal. 180 tablet 3  . glucose blood (ONETOUCH VERIO) test strip Use to check blood sugar 2 times a day. 200 each 12  . Insulin Degludec (TRESIBA FLEXTOUCH) 200 UNIT/ML SOPN Inject 40 Units into the skin daily. 6 pen 3  . LIVALO 2 MG TABS TAKE 1 TABLET BY MOUTH EVERY DAY 30 tablet 10  . metFORMIN (GLUCOPHAGE) 1000 MG tablet TAKE 1 TABLET (1,000 MG TOTAL) BY  MOUTH 2 (TWO) TIMES DAILY WITH A MEAL. 180 tablet 2  . Multiple Vitamins-Minerals (MULTIVITAMINS THER. W/MINERALS) TABS Take 1 tablet by mouth daily.      . Omega-3 Fatty Acids (FISH OIL) 500 MG CAPS Take 500 mg by mouth daily.    Marland Kitchen OVER THE COUNTER MEDICATION Take 1 tablet by mouth 2 (two) times daily. PATIENT TAKES CINNAMON TABLETS 1054m    . OVER THE COUNTER MEDICATION     . pantoprazole (PROTONIX) 40 MG tablet Take 1 tablet (40 mg total) by mouth daily. 90 tablet 3  . rosuvastatin (CRESTOR) 10 MG tablet Take  1 tablet (10 mg total) by mouth daily. 30 tablet 2  . TRULICITY 2.08 YE/2.3VK SOPN INJECT 0.75MG (1 PEN) ONCE A WEEK IN THE MORNING UNDER THE SKIN 4 pen 2   No current facility-administered medications on file prior to visit.    Allergies  Allergen Reactions  . Jardiance [Empagliflozin]     Myalgias   Family History  Problem Relation Age of Onset  . Hypertension Mother   . Hyperlipidemia Mother   . Heart disease Mother   . Heart disease Father   . Hyperlipidemia Father   . Diabetes Father   . Heart attack Father   . Varicose Veins Father   . Peripheral vascular disease Father        amputation  . Cancer Paternal Aunt        lung/ brain    PE: There were no vitals taken for this visit. Wt Readings from Last 3 Encounters:  12/15/18 189 lb 12 oz (86.1 kg)  10/16/18 187 lb (84.8 kg)  07/18/18 189 lb (85.7 kg)   Constitutional: overweight, in NAD Eyes: PERRLA, EOMI, no exophthalmos ENT: moist mucous membranes, no thyromegaly, no cervical lymphadenopathy Cardiovascular: RRR, No MRG Respiratory: CTA B Gastrointestinal: abdomen soft, NT, ND, BS+ Musculoskeletal: no deformities, strength intact in all 4 Skin: moist, warm, no rashes Neurological: no tremor with outstretched hands, DTR normal in all 4  ASSESSMENT: 1. DM2, insulin-dependent, uncontrolled, without long term complications, but with hyperglycemia She initially had muscle cramps and skin peeling from  Invokana, then, she again had muscle cramps with [place].  2. Overweight  3. HL  PLAN:  1. Patient with longstanding, uncontrolled, type 2 diabetes, on oral antidiabetic regimen, GLP-1 receptor agonist and also long-acting insulin. HbA1c decreased from 11.2% to 9.6% at last visit and most recently, she had an even lower HbA1c, at 8.7% last month. -She was on [place] in the past but had to stop due to muscle aches.  At last visit we discussed about the need to cut out snacks and improve her diet.  At last visit, sugars are better but were still above target, with spikes in the 300s.  We restarted glipizide and changed from [place] to [place].  -At this visit, sugars are better at all times of the day, but still slightly above target.  We discussed about increasing Tresiba slightly, but I do not feel that she needs any other changes in her regimen for now.  At next visit, I am hoping that we can start reducing the [place] and glipizide doses. -  I suggested to:  Patient Instructions  Please  continue: - Metformin 1000 mg 2x a day, with meals - Glipizide 10 mg 20-30 minutes before b'fast and dinner - Trulicity 1.5 mg weekly - Farxiga 10 mg before b'fast  Please increase: - Tresiba 44-46 units daily  Please return in 3-4 months with your sugar log.  - advised to check sugars at different times of the day - 3x a day, rotating check times - advised for yearly eye exams >> she is UTD - return to clinic in 3-4 months    2. Overweight -No significant weight loss since last visit -Continue GLP-1 receptor agonist which should also help with weight loss - we had to stop her previous SGLT 2 inhibitor (Invokana) due to muscle cramps >> now on Farxiga, which she tolerates well  3. HL - Reviewed latest lipid panel from last month: LDL above target, triglycerides high, HDL at goal Lab  Results  Component Value Date   CHOL 238 (H) 12/15/2018   HDL 48.80 12/15/2018   LDLCALC 96  03/24/2015   LDLDIRECT 148.0 12/15/2018   TRIG 290.0 (H) 12/15/2018   CHOLHDL 5 12/15/2018  -She was previously on labetalol but switched to Crestor after the above results returned.  She has no side effects from this.  She also continues fish oil and fenofibrate.  Philemon Kingdom, MD PhD [facility] Endocrinology

## 2019-02-04 ENCOUNTER — Other Ambulatory Visit: Payer: Self-pay | Admitting: Internal Medicine

## 2019-02-06 ENCOUNTER — Other Ambulatory Visit: Payer: Self-pay | Admitting: Internal Medicine

## 2019-03-15 ENCOUNTER — Other Ambulatory Visit: Payer: Self-pay | Admitting: Internal Medicine

## 2019-03-15 DIAGNOSIS — E785 Hyperlipidemia, unspecified: Secondary | ICD-10-CM

## 2019-03-24 ENCOUNTER — Other Ambulatory Visit: Payer: Self-pay

## 2019-03-24 ENCOUNTER — Other Ambulatory Visit (INDEPENDENT_AMBULATORY_CARE_PROVIDER_SITE_OTHER): Payer: 59

## 2019-03-24 DIAGNOSIS — E785 Hyperlipidemia, unspecified: Secondary | ICD-10-CM | POA: Diagnosis not present

## 2019-03-24 DIAGNOSIS — E1165 Type 2 diabetes mellitus with hyperglycemia: Secondary | ICD-10-CM

## 2019-03-24 DIAGNOSIS — Z794 Long term (current) use of insulin: Secondary | ICD-10-CM | POA: Diagnosis not present

## 2019-03-24 LAB — COMPREHENSIVE METABOLIC PANEL
ALT: 25 U/L (ref 0–35)
AST: 18 U/L (ref 0–37)
Albumin: 4.4 g/dL (ref 3.5–5.2)
Alkaline Phosphatase: 58 U/L (ref 39–117)
BUN: 17 mg/dL (ref 6–23)
CO2: 27 mEq/L (ref 19–32)
Calcium: 9.5 mg/dL (ref 8.4–10.5)
Chloride: 100 mEq/L (ref 96–112)
Creatinine, Ser: 0.83 mg/dL (ref 0.40–1.20)
GFR: 69.86 mL/min (ref >60.00)
Glucose, Bld: 156 mg/dL — ABNORMAL HIGH (ref 70–99)
Potassium: 3.9 mEq/L (ref 3.5–5.1)
Sodium: 138 mEq/L (ref 135–145)
Total Bilirubin: 0.4 mg/dL (ref 0.2–1.2)
Total Protein: 7.2 g/dL (ref 6.0–8.3)

## 2019-03-24 LAB — LIPID PANEL
Cholesterol: 163 mg/dL (ref 0–200)
HDL: 47.5 mg/dL (ref >39.00)
NonHDL: 115.79
Total CHOL/HDL Ratio: 3
Triglycerides: 297 mg/dL — ABNORMAL HIGH (ref 0.0–149.0)
VLDL: 59.4 mg/dL — ABNORMAL HIGH (ref 0.0–40.0)

## 2019-03-24 LAB — LDL CHOLESTEROL, DIRECT: Direct LDL: 80 mg/dL

## 2019-04-01 ENCOUNTER — Other Ambulatory Visit: Payer: Self-pay

## 2019-04-01 DIAGNOSIS — E785 Hyperlipidemia, unspecified: Secondary | ICD-10-CM

## 2019-04-01 MED ORDER — FENOFIBRATE 145 MG PO TABS: 145.0000 mg | ORAL_TABLET | Freq: Every day | ORAL | 3 refills | Status: DC

## 2019-04-01 NOTE — Addendum Note (Signed)
Addended by: Lurlean Nanny on: 04/01/2019 11:20 AM   Modules accepted: Orders

## 2019-04-09 ENCOUNTER — Other Ambulatory Visit: Payer: Self-pay | Admitting: Internal Medicine

## 2019-04-09 DIAGNOSIS — E785 Hyperlipidemia, unspecified: Secondary | ICD-10-CM

## 2019-04-24 ENCOUNTER — Other Ambulatory Visit: Payer: Self-pay | Admitting: Internal Medicine

## 2019-05-21 ENCOUNTER — Other Ambulatory Visit: Payer: Self-pay | Admitting: Internal Medicine

## 2019-05-21 DIAGNOSIS — E1165 Type 2 diabetes mellitus with hyperglycemia: Secondary | ICD-10-CM

## 2019-05-27 ENCOUNTER — Other Ambulatory Visit: Payer: Self-pay

## 2019-05-29 ENCOUNTER — Encounter: Payer: Self-pay | Admitting: Internal Medicine

## 2019-05-29 ENCOUNTER — Ambulatory Visit: Payer: 59 | Admitting: Internal Medicine

## 2019-05-29 ENCOUNTER — Other Ambulatory Visit: Payer: Self-pay

## 2019-05-29 VITALS — BP 130/80 | HR 87 | Ht 65.5 in | Wt 191.0 lb

## 2019-05-29 DIAGNOSIS — E663 Overweight: Secondary | ICD-10-CM

## 2019-05-29 DIAGNOSIS — E782 Mixed hyperlipidemia: Secondary | ICD-10-CM

## 2019-05-29 DIAGNOSIS — Z794 Long term (current) use of insulin: Secondary | ICD-10-CM | POA: Diagnosis not present

## 2019-05-29 DIAGNOSIS — E1165 Type 2 diabetes mellitus with hyperglycemia: Secondary | ICD-10-CM

## 2019-05-29 LAB — POCT GLYCOSYLATED HEMOGLOBIN (HGB A1C): Hemoglobin A1C: 8.3 % — AB (ref 4.0–5.6)

## 2019-05-29 MED ORDER — TRULICITY 1.5 MG/0.5ML ~~LOC~~ SOAJ: 1.5000 mg | SUBCUTANEOUS | 5 refills | Status: DC

## 2019-05-29 NOTE — Progress Notes (Signed)
Patient ID: [name], female   DOB: 08-06-1955, 64 y.o.   MRN: [id number]   This visit occurred during the SARS-CoV-2 public health emergency.  Safety protocols were in place, including screening questions prior to the visit, additional usage of staff PPE, and extensive cleaning of exam room while observing appropriate contact time as indicated for disinfecting solutions.   HPI: [name] is a 64 y.o.-year-old female, returning for f/u for DM2, dx in 2013, insulin-dependent since 2016, uncontrolled, without long term complications.   Last visit 5 months ago.   Her husband is also my patient - he has terminal cancer - now on hospice. She is under a lot of stress.  Reviewed HbA1c levels: Lab Results  Component Value Date   HGBA1C 8.7 (H) 12/15/2018   HGBA1C 9.6 (A) 10/16/2018   HGBA1C 11.2 (A) 07/18/2018   Pt is on a regimen of: - Metformin  1000 mg 2x a day, with meals - Glipizide 10 mg 20-30 minutes before breakfast and dinner-restarted 10/2018 - Farxiga 10 mg before b'fast - Trulicity 0.86 mg weekly (she is not on 1.5 mg weekly!) - Tresiba 40 >> 44-46 units daily-changed from [facility] 10/2018 She had mm cramps from Invokana.  Pt checks her sugars 4 times a day: - am: 120-125 >> 180 >> 151-281, 313 >> 143-169 >> 110, 135-160 - 2h after b'fast: n/c >> 147, 164 >> n/c >> 189-312 >> n/c - before lunch: n/c >> 160 >> 120-130 >> n/c >> 151-179 >> n/c - 2h after lunch: n/c >> 151 >> n/c >> <150 >> n/c >> 150-170 - before dinner: 140, 142 >> n/c >> 147-191, 212 >> n/c >> <150 - 2h after dinner: 250-260 >> 187-214, 256 >> 143-183 (pizza) >> 160-190 - bedtime: n/c >> 179  - nighttime: n/c Lowest sugar was 115 >> 147 >> 143 >> 110 x1, 135; it is unclear at which level she has hypoglycemia unawareness Highest sugar was 256 >> 300s >> 183 >> 200.  Glucometer: OneTouch Ultra >> One T Verio Flex  Pt's meals are: - Breakfast: grits, meat or cereals - Lunch: sandwich or bowl of cereal -  Dinner: 2-3x a week eats out: chicken, steak, ribs - Snacks: , fruit No sodas.  She drinks water with Crystal light  -No CKD, last BUN/creatinine:  Lab Results  Component Value Date   BUN 17 03/24/2019   BUN 20 12/15/2018   CREATININE 0.83 03/24/2019   CREATININE 0.75 12/15/2018   -+ HL; last set of lipids: Lab Results  Component Value Date   CHOL 163 03/24/2019   HDL 47.50 03/24/2019   LDLCALC 96 03/24/2015   LDLDIRECT 80.0 03/24/2019   TRIG 297.0 (H) 03/24/2019   CHOLHDL 3 03/24/2019  On Crestor, fish oil, fenofibrate. - last eye exam was 01/2019: No DR - she denies numbness and tingling in her feet.  She had vertigo >> seeing specialist [facility] >> changed from meclizine to Valium.  She has a microphone in the right ear which transmits to the left.   Her husband has liver cancer stage IV. He was doing ChTx, now on immunotherapy.  He is doing well.  ROS: Constitutional: no weight gain/no weight loss, no fatigue, no subjective hyperthermia, no subjective hypothermia Eyes: no blurry vision, no xerophthalmia ENT: no sore throat, no nodules palpated in neck, no dysphagia, no odynophagia, no hoarseness Cardiovascular: no CP/no SOB/no palpitations/no leg swelling Respiratory: no cough/no SOB/no wheezing Gastrointestinal: no N/no V/no D/no C/no acid reflux  Musculoskeletal: no muscle aches/no joint aches Skin: no rashes, no hair loss Neurological: no tremors/no numbness/no tingling/no dizziness  I reviewed pt's medications, allergies, PMH, social hx, family hx, and changes were documented in the history of present illness. Otherwise, unchanged from my initial visit note.  Past Medical History:  Diagnosis Date  . Blood transfusion    1984   [place]  . Chronic kidney disease    occasional renal stone  . Complication of anesthesia    woke during surgery (ablation) 2006?  . Diabetes mellitus    type 2 Diab- A1C-7  . GERD (gastroesophageal reflux disease)    occasional  Protonix-will take protonix night before surgery  . Hypercholesteremia    on meds   Past Surgical History:  Procedure Laterality Date  . BLADDER SUSPENSION  12/08/2010   Procedure: TRANSVAGINAL TAPE (TVT) PROCEDURE;  Surgeon: Arloa Koh;  Location: [facility];  Service: Gynecology;  Laterality: N/A;  transvaginal tape with cystoscopy  . BLADDER SUSPENSION  01/30/2011   Procedure: TRANSVAGINAL TAPE (TVT) PROCEDURE;  Surgeon: Arloa Koh;  Location: [facility];  Service: Gynecology;  Laterality: N/A;  excision of excess sling from suprapubic incision and cystoscopy  . [place] SURGERY  2004  . CYSTOSCOPY  01/30/2011   Procedure: CYSTOSCOPY;  Surgeon: Arloa Koh;  Location: [facility];  Service: Gynecology;  Laterality: N/A;  . TONSILLECTOMY  1976  . transvaginal tape  8/12  . uterine ablation     Social History   Social History  . Marital status: Married    Spouse name: N/A  . Number of children: 2   Occupational History  . retired   Social History Main Topics  . Smoking status: Former Smoker    Packs/day: 0.75    Years: 30.00    Types: Cigarettes  . Smokeless tobacco: Never Used     Comment: recently quit 03/01/2015  . Alcohol use No  . Drug use: No   Current Outpatient Medications on File Prior to Visit  Medication Sig Dispense Refill  . albuterol (PROVENTIL HFA;VENTOLIN HFA) 108 (90 Base) MCG/ACT inhaler Inhale 2 puffs into the lungs every 4 (four) hours as needed for wheezing. 1 Inhaler 0  . B-D UF III MINI PEN NEEDLES 31G X 5 MM MISC USE TO INJECT INSULIN DAILY 100 each 5  . Blood Glucose Monitoring Suppl (ONETOUCH VERIO) w/Device KIT Use to check blood sugar 2 times a day.    . Calcium Carbonate-Vitamin D (CALTRATE 600+D PO) Take by mouth.    . diazepam (VALIUM) 2 MG tablet Take 2 mg by mouth.    Marland Kitchen FARXIGA 10 MG TABS tablet TAKE 1 TABLET BY MOUTH EVERY DAY 30 tablet 4  . fenofibrate (TRICOR) 145 MG tablet Take 1 tablet (145 mg total) by mouth daily. 90 tablet 3  .  glipiZIDE (GLUCOTROL) 10 MG tablet Take 1 tablet (10 mg total) by mouth 2 (two) times daily before a meal. 180 tablet 3  . glucose blood (ONETOUCH VERIO) test strip Use to check blood sugar 2 times a day. 200 each 12  . Insulin Degludec (TRESIBA FLEXTOUCH) 200 UNIT/ML SOPN Inject 46 Units into the skin daily. 6 pen 3  . metFORMIN (GLUCOPHAGE) 1000 MG tablet TAKE 1 TABLET (1,000 MG TOTAL) BY MOUTH 2 (TWO) TIMES DAILY WITH A MEAL. 180 tablet 2  . Multiple Vitamins-Minerals (MULTIVITAMINS THER. W/MINERALS) TABS Take 1 tablet by mouth daily.      . Omega-3 Fatty Acids (FISH OIL) 500 MG  CAPS Take 500 mg by mouth daily.    Marland Kitchen OVER THE COUNTER MEDICATION Take 1 tablet by mouth 2 (two) times daily. PATIENT TAKES CINNAMON TABLETS 1072m    . OVER THE COUNTER MEDICATION     . pantoprazole (PROTONIX) 40 MG tablet Take 1 tablet (40 mg total) by mouth daily. 90 tablet 3  . rosuvastatin (CRESTOR) 10 MG tablet TAKE 1 TABLET BY MOUTH EVERY DAY 90 tablet 0  . TRULICITY 05.05MLZ/7.6BHSOPN INJECT 0.75MG (1 PEN) ONCE A WEEK IN THE MORNING UNDER THE SKIN 2 pen 5   No current facility-administered medications on file prior to visit.   Allergies  Allergen Reactions  . Jardiance [Empagliflozin]     Myalgias   Family History  Problem Relation Age of Onset  . Hypertension Mother   . Hyperlipidemia Mother   . Heart disease Mother   . Heart disease Father   . Hyperlipidemia Father   . Diabetes Father   . Heart attack Father   . Varicose Veins Father   . Peripheral vascular disease Father        amputation  . Cancer Paternal Aunt        lung/ brain    PE: BP 130/80   Pulse 87   Ht 5' 5.5" (1.664 m)   Wt 191 lb (86.6 kg)   SpO2 97%   BMI 31.30 kg/m  Wt Readings from Last 3 Encounters:  05/29/19 191 lb (86.6 kg)  01/23/19 190 lb (86.2 kg)  12/15/18 189 lb 12 oz (86.1 kg)   Constitutional: overweight, in NAD Eyes: PERRLA, EOMI, no exophthalmos ENT: moist mucous membranes, no thyromegaly, no cervical  lymphadenopathy Cardiovascular: RRR, No MRG Respiratory: CTA B Gastrointestinal: abdomen soft, NT, ND, BS+ Musculoskeletal: no deformities, strength intact in all 4 Skin: moist, warm, no rashes Neurological: no tremor with outstretched hands, DTR normal in all 4  ASSESSMENT: 1. DM2, insulin-dependent, uncontrolled, without long term complications, but with hyperglycemia She initially had muscle cramps and skin peeling from Invokana, then, she again had muscle cramps with FIran  2. Overweight  3. HL  PLAN:  1. Patient with longstanding, uncontrolled, type 2 diabetes, on a diabetic regimen with Metformin, sulfonylurea, and SGLT2 inhibitor, also, daily long-acting insulin and weekly GLP-1 receptor with improving control at last visit.  Her HbA1c was extremely high in the past but at last visit just better, at 8.7%. -We discussed in the past about cutting out snacks and improving her diet -She was on [place] the past but she had to stop due to muscle aches.  We were able to restart FIranbefore last visit and she is not tolerating it well. -At last visit sugars were still high so we increased her Tresiba dose slightly. -At this visit, sugars are similar to before may be slightly higher later in the day.  Upon questioning, however, she is not taking the 1.5 mg of Trulicity as she was supposed to, but only 0.75 mg weekly.  Therefore, at this visit, I advised to increase the dose to 1.5 mg weekly since she is tolerating well the low dose.  In approximately 1 month, if sugars do not improve significantly, I advised her to get in touch with me and we can increase the dose further to 3 mg weekly.  For now, we will continue the rest of the regimen. -  I suggested to:  Patient Instructions  Please  continue: - Metformin  1000 mg 2x a day, with  meals - Glipizide  10 mg mg 20-30 minutes before b'fast and dinner - Farxiga 10 mg before b'fast - Tresiba 44 to 46 units daily  Please  increase: - Trulicity to 1.5 mg weekly  Please return in  3-4 months with your sugar log.  - we checked her HbA1c: 8.3% (lower) - advised to check sugars at different times of the day - 1-2x a day, rotating check times - advised for yearly eye exams >> she is UTD - return to clinic in 3-4 months    2. Overweight -Continue GLP-1 receptor agonist and SGLT 2 inhibitor, which should both help with weight loss -At this visit, will increase the GLP-1 agonist dose and this should help further with weight loss  3. HL -Reviewed latest lipid panel from 03/2019: LDL at goal, but triglycerides high Lab Results  Component Value Date   CHOL 163 03/24/2019   HDL 47.50 03/24/2019   LDLCALC 96 03/24/2015   LDLDIRECT 80.0 03/24/2019   TRIG 297.0 (H) 03/24/2019   CHOLHDL 3 03/24/2019  -On Crestor, fenofibrate and fish oil, without side effects  Philemon Kingdom, MD PhD [facility] Endocrinology

## 2019-05-29 NOTE — Patient Instructions (Addendum)
Please  continue: - Metformin  1000 mg 2x a day, with meals - Glipizide  10 mg mg 20-30 minutes before b'fast and dinner - Farxiga 10 mg before b'fast - Tresiba 44 to 46 units daily  Please increase: - Trulicity to 1.5 mg weekly  Please return in  3-4 months with your sugar log.

## 2019-05-29 NOTE — Addendum Note (Signed)
Addended by: Cardell Peach I on: 05/29/2019 11:11 AM   Modules accepted: Orders

## 2019-06-27 ENCOUNTER — Other Ambulatory Visit: Payer: Self-pay | Admitting: Internal Medicine

## 2019-07-01 ENCOUNTER — Other Ambulatory Visit: Payer: Self-pay | Admitting: Internal Medicine

## 2019-07-02 ENCOUNTER — Other Ambulatory Visit: Payer: Self-pay

## 2019-07-02 ENCOUNTER — Other Ambulatory Visit: Payer: Self-pay | Admitting: Internal Medicine

## 2019-07-02 ENCOUNTER — Other Ambulatory Visit (INDEPENDENT_AMBULATORY_CARE_PROVIDER_SITE_OTHER): Payer: 59

## 2019-07-02 DIAGNOSIS — Z794 Long term (current) use of insulin: Secondary | ICD-10-CM

## 2019-07-02 DIAGNOSIS — E1165 Type 2 diabetes mellitus with hyperglycemia: Secondary | ICD-10-CM

## 2019-07-02 DIAGNOSIS — E785 Hyperlipidemia, unspecified: Secondary | ICD-10-CM

## 2019-07-02 LAB — LIPID PANEL
Cholesterol: 138 mg/dL (ref 0–200)
HDL: 47.7 mg/dL (ref >39.00)
LDL Cholesterol: 52 mg/dL (ref 0–99)
NonHDL: 90.27
Total CHOL/HDL Ratio: 3
Triglycerides: 191 mg/dL — ABNORMAL HIGH (ref 0.0–149.0)
VLDL: 38.2 mg/dL (ref 0.0–40.0)

## 2019-07-02 NOTE — Addendum Note (Signed)
Addended by: Ellamae Sia on: 07/02/2019 07:38 AM   Modules accepted: Orders

## 2019-07-03 LAB — HEMOGLOBIN A1C
Hgb A1c MFr Bld: 8.6 % of total Hgb — ABNORMAL HIGH (ref <5.7)
Mean Plasma Glucose: 200 (calc)
eAG (mmol/L): 11.1 (calc)

## 2019-07-14 ENCOUNTER — Other Ambulatory Visit: Payer: Self-pay | Admitting: Family Medicine

## 2019-09-25 ENCOUNTER — Other Ambulatory Visit: Payer: Self-pay

## 2019-09-28 DIAGNOSIS — Z1231 Encounter for screening mammogram for malignant neoplasm of breast: Secondary | ICD-10-CM | POA: Diagnosis not present

## 2019-09-29 ENCOUNTER — Other Ambulatory Visit: Payer: Self-pay

## 2019-09-29 ENCOUNTER — Encounter: Payer: Self-pay | Admitting: Internal Medicine

## 2019-09-29 ENCOUNTER — Ambulatory Visit: Payer: Medicare HMO | Admitting: Internal Medicine

## 2019-09-29 VITALS — BP 120/78 | HR 80 | Ht 65.5 in | Wt 189.5 lb

## 2019-09-29 DIAGNOSIS — E1165 Type 2 diabetes mellitus with hyperglycemia: Secondary | ICD-10-CM

## 2019-09-29 DIAGNOSIS — Z794 Long term (current) use of insulin: Secondary | ICD-10-CM | POA: Diagnosis not present

## 2019-09-29 DIAGNOSIS — E663 Overweight: Secondary | ICD-10-CM | POA: Diagnosis not present

## 2019-09-29 DIAGNOSIS — E782 Mixed hyperlipidemia: Secondary | ICD-10-CM

## 2019-09-29 LAB — POCT GLYCOSYLATED HEMOGLOBIN (HGB A1C): Hemoglobin A1C: 6.9 % — AB (ref 4.0–5.6)

## 2019-09-29 NOTE — Progress Notes (Signed)
Patient ID: [name], female   DOB: December 15, 1955, 64 y.o.   MRN: [id number]   This visit occurred during the SARS-CoV-2 public health emergency.  Safety protocols were in place, including screening questions prior to the visit, additional usage of staff PPE, and extensive cleaning of exam room while observing appropriate contact time as indicated for disinfecting solutions.   HPI: AIRANNA [name] is a 64 y.o.-year-old female, returning for f/u for DM2, dx in 2013, insulin-dependent since 2016, uncontrolled, without long term complications.   Last visit 3 months ago.  Her husband, Renia Mikelson, was also my patient - he had terminal cancer - died 07/10/2019.  Reviewed HbA1c levels: Lab Results  Component Value Date   HGBA1C 8.6 (H) 07/02/2019   HGBA1C 8.3 (A) 05/29/2019   HGBA1C 8.7 (H) 12/15/2018   HGBA1C 9.6 (A) 10/16/2018   HGBA1C 11.2 (A) 07/18/2018   HGBA1C 8.6 (H) 12/09/2017   HGBA1C 8.2 06/25/2017   HGBA1C 7.8 01/16/2017   HGBA1C 8.9 09/27/2016   HGBA1C 9.8 (H) 07/05/2016   HGBA1C 10.4 (H) 03/28/2016   HGBA1C 8.4 (H) 12/27/2015   HGBA1C 9.0 (H) 06/29/2015   HGBA1C 10.0 (H) 03/24/2015   HGBA1C 8.8 (H) 04/19/2014   HGBA1C 11.6 (H) 01/07/2014   HGBA1C 12.2 (H) 10/06/2013   HGBA1C 9.4 (H) 04/27/2013   Pt is on a regimen of: - Metformin  1000 mg 2x a day, with meals - Glipizide 10 mg 20-30 minutes before breakfast and dinner-restarted 10/2018 - Farxiga 10 mg before b'fast - Trulicity 8.18 mg weekly >> 1.5 mg weekly - started 07/2019 - Tresiba 40 >> 44-46 units daily-changed from [facility] 10/2018 She had mm cramps from Invokana.  Pt checks her sugars 4 times a day: - am:  180 >> 151-281, 313 >> 143-169 >> 110, 135-160 >> 109-130 - 2h after b'fast: n/c >> 147, 164 >> n/c >> 189-312 >> n/c >> 138-165 - before lunch: 160 >> 120-130 >> n/c >> 151-179 >> n/c - 2h after lunch:151 >> n/c >> <150 >> n/c >> 150-170 >> n/c - before dinner:  n/c >> 147-191, 212 >> n/c >> <150 >> n/c - 2h  after dinner: 187-214, 256 >> 143-183 (pizza) >> 160-190 >> 153-190 - bedtime: n/c >> 179  - nighttime: n/c Lowest sugar was 115 >> 147 >> 143 >> 110 x1, 135 >> 109; it is unclear at which level she has hypoglycemia awareness. Highest sugar was 256 >> 300s >> 183 >> 200 >> 190.  Glucometer: OneTouch Ultra >> One T Verio Flex  Pt's meals are: - Breakfast: grits, meat or cereals - Lunch: sandwich or bowl of cereal - Dinner: 2-3x a week eats out: chicken, steak, ribs - Snacks: , fruit No sodas.  She drinks water with Crystal light  -No CKD, last BUN/creatinine:  Lab Results  Component Value Date   BUN 17 03/24/2019   BUN 20 12/15/2018   CREATININE 0.83 03/24/2019   CREATININE 0.75 12/15/2018   -+ HL; last set of lipids: Lab Results  Component Value Date   CHOL 138 07/02/2019   HDL 47.70 07/02/2019   LDLCALC 52 07/02/2019   LDLDIRECT 80.0 03/24/2019   TRIG 191.0 (H) 07/02/2019   CHOLHDL 3 07/02/2019  On Crestor, fish oil, fenofibrate. - last eye exam was 01/2019: No DR - no numbness and tingling in her feet.  She had vertigo >> [facility] specialist [facility] >> changed from meclizine to Valium.  She has a microphone in the right ear  which transmits to the left.   Her husband has liver cancer stage IV. He was doing ChTx, now on immunotherapy.  He is doing well.  ROS: Constitutional: no weight gain/no weight loss, no fatigue, no subjective hyperthermia, no subjective hypothermia Eyes: no blurry vision, no xerophthalmia ENT: no sore throat, no nodules palpated in neck, no dysphagia, no odynophagia, no hoarseness Cardiovascular: no CP/no SOB/no palpitations/no leg swelling Respiratory: no cough/no SOB/no wheezing Gastrointestinal: no N/no V/no D/no C/no acid reflux Musculoskeletal: no muscle aches/no joint aches Skin: no rashes, no hair loss Neurological: no tremors/no numbness/no tingling/no dizziness  I reviewed pt's medications, allergies, PMH, social hx, family hx, and  changes were documented in the history of present illness. Otherwise, unchanged from my initial visit note.  Past Medical History:  Diagnosis Date  . Blood transfusion    1984   [place]  . Chronic kidney disease    occasional renal stone  . Complication of anesthesia    woke during surgery (ablation) 2006?  . Diabetes mellitus    type 2 Diab- A1C-7  . GERD (gastroesophageal reflux disease)    occasional Protonix-will take protonix night before surgery  . Hypercholesteremia    on meds   Past Surgical History:  Procedure Laterality Date  . BLADDER SUSPENSION  12/08/2010   Procedure: TRANSVAGINAL TAPE (TVT) PROCEDURE;  Surgeon: Arloa Koh;  Location: [place];  Service: Gynecology;  Laterality: N/A;  transvaginal tape with cystoscopy  . BLADDER SUSPENSION  01/30/2011   Procedure: TRANSVAGINAL TAPE (TVT) PROCEDURE;  Surgeon: Arloa Koh;  Location: [facility];  Service: Gynecology;  Laterality: N/A;  excision of excess sling from suprapubic incision and cystoscopy  . [place] SURGERY  2004  . CYSTOSCOPY  01/30/2011   Procedure: CYSTOSCOPY;  Surgeon: Arloa Koh;  Location: [facility];  Service: Gynecology;  Laterality: N/A;  . TONSILLECTOMY  1976  . transvaginal tape  8/12  . uterine ablation     Social History   Social History  . Marital status: Married    Spouse name: N/A  . Number of children: 2   Occupational History  . retired   Social History Main Topics  . Smoking status: Former Smoker    Packs/day: 0.75    Years: 30.00    Types: Cigarettes  . Smokeless tobacco: Never Used     Comment: recently quit 03/01/2015  . Alcohol use No  . Drug use: No   Current Outpatient Medications on File Prior to Visit  Medication Sig Dispense Refill  . B-D UF III MINI PEN NEEDLES 31G X 5 MM MISC USE TO INJECT INSULIN DAILY 100 each 5  . Blood Glucose Monitoring Suppl (ONETOUCH VERIO) w/Device KIT Use to check blood sugar 2 times a day.    . Calcium Carbonate-Vitamin D (CALTRATE 600+D  PO) Take by mouth.    . diazepam (VALIUM) 2 MG tablet Take 2 mg by mouth.    . Dulaglutide (TRULICITY) 1.5 JS/9.7WY SOPN Inject 1.5 mg into the skin once a week. 4 pen 5  . FARXIGA 10 MG TABS tablet TAKE 1 TABLET BY MOUTH EVERY DAY 30 tablet 4  . fenofibrate (TRICOR) 145 MG tablet Take 1 tablet (145 mg total) by mouth daily. 90 tablet 3  . glipiZIDE (GLUCOTROL) 10 MG tablet Take 1 tablet (10 mg total) by mouth 2 (two) times daily before a meal. 180 tablet 3  . glucose blood (ONETOUCH VERIO) test strip Use to check blood sugar 2 times a day.  200 each 12  . Insulin Degludec (TRESIBA FLEXTOUCH) 200 UNIT/ML SOPN Inject 46 Units into the skin daily. 6 pen 3  . metFORMIN (GLUCOPHAGE) 1000 MG tablet TAKE 1 TABLET (1,000 MG TOTAL) BY MOUTH 2 (TWO) TIMES DAILY WITH A MEAL. 180 tablet 2  . Multiple Vitamins-Minerals (MULTIVITAMINS THER. W/MINERALS) TABS Take 1 tablet by mouth daily.      . Omega-3 Fatty Acids (FISH OIL) 500 MG CAPS Take 500 mg by mouth daily.    Marland Kitchen OVER THE COUNTER MEDICATION Take 1 tablet by mouth 2 (two) times daily. PATIENT TAKES CINNAMON TABLETS 1086m    . pantoprazole (PROTONIX) 40 MG tablet Take 1 tablet (40 mg total) by mouth daily. 90 tablet 3   No current facility-administered medications on file prior to visit.   Allergies  Allergen Reactions  . Jardiance [Empagliflozin]     Myalgias   Family History  Problem Relation Age of Onset  . Hypertension Mother   . Hyperlipidemia Mother   . Heart disease Mother   . Heart disease Father   . Hyperlipidemia Father   . Diabetes Father   . Heart attack Father   . Varicose Veins Father   . Peripheral vascular disease Father        amputation  . Cancer Paternal Aunt        lung/ brain    PE: BP 120/78 (BP Location: Left Arm, Patient Position: Sitting, Cuff Size: Normal)   Pulse 80   Ht 5' 5.5" (1.664 m)   Wt 189 lb 8 oz (86 kg)   SpO2 97%   BMI 31.05 kg/m  Wt Readings from Last 3 Encounters:  09/29/19 189 lb 8 oz (86  kg)  05/29/19 191 lb (86.6 kg)  01/23/19 190 lb (86.2 kg)   Constitutional: overweight, in NAD Eyes: PERRLA, EOMI, no exophthalmos ENT: moist mucous membranes, no thyromegaly, no cervical lymphadenopathy Cardiovascular: RRR, No MRG Respiratory: CTA B Gastrointestinal: abdomen soft, NT, ND, BS+ Musculoskeletal: no deformities, strength intact in all 4 Skin: moist, warm, no rashes Neurological: no tremor with outstretched hands, DTR normal in all 4 ASSESSMENT: 1. DM2, insulin-dependent, uncontrolled, without long term complications, but with hyperglycemia She initially had muscle cramps and skin peeling from Invokana, then, she again had muscle cramps with FIran  2. Overweight  3. HL  PLAN:  1. Patient with longstanding uncontrolled, type 2 diabetes, on oral antidiabetic regimen with Metformin, sulfonylurea, and SGLT 2 inhibitor, also daily long-acting insulin and weekly GLP-1 receptor agonist, increased at last visit.  She was on [place] the past but she had to stop this due to muscle aches.  We were able to restart FIranand she is now tolerating it well and staying hydrated.  At last visit, she was taking the lower dose of Trulicity, by mistake, and we increased it to 1.5 mg weekly.  We continued the rest of her regimen. -At this visit, sugars are improved, especially in the morning.  However, also later in the day, they are mostly at goal with only occasional higher blood sugars after dinner.  She is tolerating the 1.5 mg of Trulicity fairly well, but she has had some nausea after the injections, which is improving.  She would like to continue on the same dose for now.  As sugars are improving, and she does not have significant hyper or hypoglycemia, we will continue the current regimen.  We discussed that at next visit, we can start trying to decrease her glipizide  and possibly also [place] doses. -  I suggested to:  Patient Instructions  Please  continue: -  Metformin  1000 mg 2x a day, with meals - Glipizide  10 mg mg 20-30 minutes before b'fast and dinner - Farxiga 10 mg before b'fast - Tresiba 44 to 46 units daily - Trulicity  1.5 mg weekly  Please return in  3-4 months with your sugar log.  - we checked her HbA1c: 6.9% (improved) - advised to check sugars at different times of the day - 2-3x a day, rotating check times - advised for yearly eye exams >> she is UTD - return to clinic in 3-4 months    2. Overweight -continue SGLT 2 inhibitor and GLP-1 receptor agonist which should also help with weight loss -She lost 2 pounds since last visit  3. HL -Reviewed latest lipid panel from 06/2019: LDL improved, at goal, triglycerides slightly high: Lab Results  Component Value Date   CHOL 138 07/02/2019   HDL 47.70 07/02/2019   LDLCALC 52 07/02/2019   LDLDIRECT 80.0 03/24/2019   TRIG 191.0 (H) 07/02/2019   CHOLHDL 3 07/02/2019  -Continues on Crestor, fenofibrate, and fish oil, without side effects  Philemon Kingdom, MD PhD [facility] Endocrinology

## 2019-09-29 NOTE — Addendum Note (Signed)
Addended by: Marian Sorrow on: 09/29/2019 08:19 AM   Modules accepted: Orders

## 2019-09-29 NOTE — Patient Instructions (Signed)
Please  continue: - Metformin  1000 mg 2x a day, with meals - Glipizide  10 mg mg 20-30 minutes before b'fast and dinner - Farxiga 10 mg before b'fast - Tresiba 44 to 46 units daily - Trulicity  1.5 mg weekly  Please return in  3-4 months with your sugar log.

## 2019-09-30 ENCOUNTER — Other Ambulatory Visit: Payer: Self-pay

## 2019-09-30 MED ORDER — TRESIBA FLEXTOUCH 200 UNIT/ML ~~LOC~~ SOPN: 46.0000 [IU] | PEN_INJECTOR | Freq: Every day | SUBCUTANEOUS | 3 refills | Status: DC

## 2019-10-03 ENCOUNTER — Other Ambulatory Visit: Payer: Self-pay | Admitting: Internal Medicine

## 2019-10-03 DIAGNOSIS — Z794 Long term (current) use of insulin: Secondary | ICD-10-CM

## 2019-10-11 DIAGNOSIS — Z87891 Personal history of nicotine dependence: Secondary | ICD-10-CM | POA: Diagnosis not present

## 2019-10-11 DIAGNOSIS — R030 Elevated blood-pressure reading, without diagnosis of hypertension: Secondary | ICD-10-CM | POA: Diagnosis not present

## 2019-10-11 DIAGNOSIS — E785 Hyperlipidemia, unspecified: Secondary | ICD-10-CM | POA: Diagnosis not present

## 2019-10-11 DIAGNOSIS — K219 Gastro-esophageal reflux disease without esophagitis: Secondary | ICD-10-CM | POA: Diagnosis not present

## 2019-10-11 DIAGNOSIS — E109 Type 1 diabetes mellitus without complications: Secondary | ICD-10-CM | POA: Diagnosis not present

## 2019-10-11 DIAGNOSIS — Z79899 Other long term (current) drug therapy: Secondary | ICD-10-CM | POA: Diagnosis not present

## 2019-10-11 DIAGNOSIS — E669 Obesity, unspecified: Secondary | ICD-10-CM | POA: Diagnosis not present

## 2019-10-11 DIAGNOSIS — Z833 Family history of diabetes mellitus: Secondary | ICD-10-CM | POA: Diagnosis not present

## 2019-10-11 DIAGNOSIS — Z7982 Long term (current) use of aspirin: Secondary | ICD-10-CM | POA: Diagnosis not present

## 2019-12-17 ENCOUNTER — Encounter: Payer: Self-pay | Admitting: Internal Medicine

## 2019-12-17 ENCOUNTER — Other Ambulatory Visit: Payer: Self-pay

## 2019-12-17 ENCOUNTER — Ambulatory Visit (INDEPENDENT_AMBULATORY_CARE_PROVIDER_SITE_OTHER): Payer: Medicare HMO | Admitting: Internal Medicine

## 2019-12-17 VITALS — BP 124/88 | HR 88 | Temp 97.7°F | Ht 65.5 in | Wt 189.0 lb

## 2019-12-17 DIAGNOSIS — G4700 Insomnia, unspecified: Secondary | ICD-10-CM | POA: Insufficient documentation

## 2019-12-17 DIAGNOSIS — K219 Gastro-esophageal reflux disease without esophagitis: Secondary | ICD-10-CM

## 2019-12-17 DIAGNOSIS — H8113 Benign paroxysmal vertigo, bilateral: Secondary | ICD-10-CM | POA: Diagnosis not present

## 2019-12-17 DIAGNOSIS — Z780 Asymptomatic menopausal state: Secondary | ICD-10-CM | POA: Diagnosis not present

## 2019-12-17 DIAGNOSIS — Z794 Long term (current) use of insulin: Secondary | ICD-10-CM | POA: Diagnosis not present

## 2019-12-17 DIAGNOSIS — E782 Mixed hyperlipidemia: Secondary | ICD-10-CM

## 2019-12-17 DIAGNOSIS — M1990 Unspecified osteoarthritis, unspecified site: Secondary | ICD-10-CM | POA: Diagnosis not present

## 2019-12-17 DIAGNOSIS — E1165 Type 2 diabetes mellitus with hyperglycemia: Secondary | ICD-10-CM | POA: Diagnosis not present

## 2019-12-17 DIAGNOSIS — R69 Illness, unspecified: Secondary | ICD-10-CM | POA: Diagnosis not present

## 2019-12-17 DIAGNOSIS — Z0000 Encounter for general adult medical examination without abnormal findings: Secondary | ICD-10-CM | POA: Diagnosis not present

## 2019-12-17 DIAGNOSIS — F5104 Psychophysiologic insomnia: Secondary | ICD-10-CM

## 2019-12-17 LAB — COMPREHENSIVE METABOLIC PANEL
ALT: 34 U/L (ref 0–35)
AST: 25 U/L (ref 0–37)
Albumin: 4.5 g/dL (ref 3.5–5.2)
Alkaline Phosphatase: 57 U/L (ref 39–117)
BUN: 17 mg/dL (ref 6–23)
CO2: 28 mEq/L (ref 19–32)
Calcium: 9.6 mg/dL (ref 8.4–10.5)
Chloride: 102 mEq/L (ref 96–112)
Creatinine, Ser: 0.88 mg/dL (ref 0.40–1.20)
GFR: 65.14 mL/min (ref >60.00)
Glucose, Bld: 151 mg/dL — ABNORMAL HIGH (ref 70–99)
Potassium: 4.2 mEq/L (ref 3.5–5.1)
Sodium: 137 mEq/L (ref 135–145)
Total Bilirubin: 0.4 mg/dL (ref 0.2–1.2)
Total Protein: 7.4 g/dL (ref 6.0–8.3)

## 2019-12-17 LAB — CBC
HCT: 38.3 % (ref 36.0–46.0)
Hemoglobin: 12.8 g/dL (ref 12.0–15.0)
MCHC: 33.5 g/dL (ref 30.0–36.0)
MCV: 86.7 fl (ref 78.0–100.0)
Platelets: 293 10*3/uL (ref 150.0–400.0)
RBC: 4.41 Mil/uL (ref 3.87–5.11)
RDW: 13.5 % (ref 11.5–15.5)
WBC: 8.1 10*3/uL (ref 4.0–10.5)

## 2019-12-17 LAB — LIPID PANEL
Cholesterol: 151 mg/dL (ref 0–200)
HDL: 52.1 mg/dL (ref >39.00)
NonHDL: 98.51
Total CHOL/HDL Ratio: 3
Triglycerides: 304 mg/dL — ABNORMAL HIGH (ref 0.0–149.0)
VLDL: 60.8 mg/dL — ABNORMAL HIGH (ref 0.0–40.0)

## 2019-12-17 LAB — LDL CHOLESTEROL, DIRECT: Direct LDL: 63 mg/dL

## 2019-12-17 NOTE — Patient Instructions (Signed)

## 2019-12-17 NOTE — Assessment & Plan Note (Signed)
Encouraged regular exercise and weight loss as this will help reduce the pressure in her joints

## 2019-12-17 NOTE — Assessment & Plan Note (Signed)
She will decrease Melatonin to 2.5 mg nightly

## 2019-12-17 NOTE — Assessment & Plan Note (Signed)
C met and lipid profile today Encouraged her to consume a low-fat diet and increase aerobic exercise Continue Fenofibrate and Fish Oil

## 2019-12-17 NOTE — Assessment & Plan Note (Signed)
Avoid spicy foods Continue Pantoprazole CBC and C met today

## 2019-12-17 NOTE — Progress Notes (Signed)
Subjective:    Patient ID: [name], female    DOB: 1956-04-02, 64 y.o.   MRN: [id number]  HPI  Patient presents the clinic today for her annual exam.  She is also due to follow-up chronic conditions.  BPPV: Intermittent.  Managed with Diazepam as needed.  She follows with ENT.  OA: Mainly in her hands and shoulders.  She is not taking any medications OTC for this.  She does not follow with orthopedics.  GERD: Triggered by spicy foods.  She denies breakthrough on Pantoprazole.  There is no upper GI on file.  HLD: Her last LDL was 52, triglycerides 191, 06/2019.  She is taking Fenofibrate and Fish Oil as prescribed.  She does not consume a low-fat diet.  DM2: Her last A1c was 6.9%, 09/2019.  She is taking Glipizide, Metformin, Trulicity, Tresbia as prescribed.  Her sugars range 117-167.  She checks her feet routinely.  Her last eye exam was 07/2019. She follows with endocrinology.  Insomnia: She has trouble staying asleep. This started after the death of her husband. She lost him in February. She has tried Melatonin OTC but reports it causes her to feel drowsy in the morning. There is no sleep study on file.  Flu: 02/2019 Tetanus: 11/2011 Pneumovax: 07/2016 Covid: never Zostavax: 07/2012 Shingrix: never Pap smear: 10/2018 Mammogram: 09/2019, [facility] Bone density: Never Colon screening: 05/2013, 10 years Vision screening: annually Dentist: every 3 months  Diet: She does eat meat. She consumes fruits and veggies daily. She does not consume a low fat diet.  Exercise: None  Review of Systems      Past Medical History:  Diagnosis Date  . Blood transfusion    1984   [place]  . Chronic kidney disease    occasional renal stone  . Complication of anesthesia    woke during surgery (ablation) 2006?  . Diabetes mellitus    type 2 Diab- A1C-7  . GERD (gastroesophageal reflux disease)    occasional Protonix-will take protonix night before surgery  . Hypercholesteremia    on meds     Current Outpatient Medications  Medication Sig Dispense Refill  . B-D UF III MINI PEN NEEDLES 31G X 5 MM MISC USE TO INJECT INSULIN DAILY 100 each 5  . Blood Glucose Monitoring Suppl (ONETOUCH VERIO) w/Device KIT Use to check blood sugar 2 times a day.    . Calcium Carbonate-Vitamin D (CALTRATE 600+D PO) Take by mouth.    . diazepam (VALIUM) 2 MG tablet Take 2 mg by mouth.    . Dulaglutide (TRULICITY) 1.5 LG/9.2JJ SOPN Inject 1.5 mg into the skin once a week. 4 pen 5  . FARXIGA 10 MG TABS tablet TAKE 1 TABLET BY MOUTH EVERY DAY 30 tablet 4  . fenofibrate (TRICOR) 145 MG tablet Take 1 tablet (145 mg total) by mouth daily. 90 tablet 3  . glipiZIDE (GLUCOTROL) 10 MG tablet TAKE 1 TABLET (10 MG TOTAL) BY MOUTH 2 (TWO) TIMES DAILY BEFORE A MEAL. 180 tablet 3  . glucose blood (ONETOUCH VERIO) test strip Use to check blood sugar 2 times a day. 200 each 12  . insulin degludec (TRESIBA FLEXTOUCH) 200 UNIT/ML FlexTouch Pen Inject 46 Units into the skin daily. 6 pen 3  . metFORMIN (GLUCOPHAGE) 1000 MG tablet TAKE 1 TABLET (1,000 MG TOTAL) BY MOUTH 2 (TWO) TIMES DAILY WITH A MEAL. 180 tablet 2  . Multiple Vitamins-Minerals (MULTIVITAMINS THER. W/MINERALS) TABS Take 1 tablet by mouth daily.      Marland Kitchen  Omega-3 Fatty Acids (FISH OIL) 500 MG CAPS Take 500 mg by mouth daily.    Marland Kitchen OVER THE COUNTER MEDICATION Take 1 tablet by mouth 2 (two) times daily. PATIENT TAKES CINNAMON TABLETS 1065m    . pantoprazole (PROTONIX) 40 MG tablet Take 1 tablet (40 mg total) by mouth daily. 90 tablet 3   No current facility-administered medications for this visit.    Allergies  Allergen Reactions  . Jardiance [Empagliflozin]     Myalgias    Family History  Problem Relation Age of Onset  . Hypertension Mother   . Hyperlipidemia Mother   . Heart disease Mother   . Heart disease Father   . Hyperlipidemia Father   . Diabetes Father   . Heart attack Father   . Varicose Veins Father   . Peripheral vascular disease  Father        amputation  . Cancer Paternal Aunt        lung/ brain    Social History   Socioeconomic History  . Marital status: Married    Spouse name: Not on file  . Number of children: Not on file  . Years of education: Not on file  . Highest education level: Not on file  Occupational History  . Not on file  Tobacco Use  . Smoking status: Former Smoker    Packs/day: 0.75    Years: 30.00    Pack years: 22.50    Types: Cigarettes  . Smokeless tobacco: Never Used  . Tobacco comment: recently quit 03/01/2015  Substance and Sexual Activity  . Alcohol use: No    Alcohol/week: 0.0 standard drinks  . Drug use: No  . Sexual activity: Yes  Other Topics Concern  . Not on file  Social History Narrative  . Not on file   Social Determinants of Health   Financial Resource Strain:   . Difficulty of Paying Living Expenses:   Food Insecurity:   . Worried About RCharity fundraiserin the Last Year:   . RArboriculturistin the Last Year:   Transportation Needs:   . LFilm/video editor(Medical):   .Marland KitchenLack of Transportation (Non-Medical):   Physical Activity:   . Days of Exercise per Week:   . Minutes of Exercise per Session:   Stress:   . Feeling of Stress :   Social Connections:   . Frequency of Communication with Friends and Family:   . Frequency of Social Gatherings with Friends and Family:   . Attends Religious Services:   . Active Member of Clubs or Organizations:   . Attends CArchivistMeetings:   .Marland KitchenMarital Status:   Intimate Partner Violence:   . Fear of Current or Ex-Partner:   . Emotionally Abused:   .Marland KitchenPhysically Abused:   . Sexually Abused:      Constitutional: Denies fever, malaise, fatigue, headache or abrupt weight changes.  HEENT: Denies eye pain, eye redness, ear pain, ringing in the ears, wax buildup, runny nose, nasal congestion, bloody nose, or sore throat. Respiratory: Denies difficulty breathing, shortness of breath, cough or sputum  production.   Cardiovascular: Denies chest pain, chest tightness, palpitations or swelling in the hands or feet.  Gastrointestinal: Denies abdominal pain, bloating, constipation, diarrhea or blood in the stool.  GU: Denies urgency, frequency, pain with urination, burning sensation, blood in urine, odor or discharge. Musculoskeletal: Patient reports intermittent joint pain.  Denies decrease in range of motion, difficulty with gait, muscle pain or joint  swelling.  Skin: Denies redness, rashes, lesions or ulcercations.  Neurological: Patient reports intermittent dizziness, insomnia and hot flashes.  Denies difficulty with memory, difficulty with speech or problems with balance and coordination.  Psych: Denies anxiety, depression, SI/HI.  No other specific complaints in a complete review of systems (except as listed in HPI above).  Objective:   Physical Exam  BP 124/88   Pulse 88   Temp 97.7 F (36.5 C) (Temporal)   Ht 5' 5.5" (1.664 m)   Wt 189 lb (85.7 kg)   SpO2 98%   BMI 30.97 kg/m   Wt Readings from Last 3 Encounters:  09/29/19 189 lb 8 oz (86 kg)  05/29/19 191 lb (86.6 kg)  01/23/19 190 lb (86.2 kg)    General: Appears her stated age, obese, in NAD. Skin: Warm, dry and intact. No ulcerations noted. HEENT: Head: normal shape and size; Eyes: sclera white, no icterus, conjunctiva pink, PERRLA and EOMs intact;  Neck:  Neck supple, trachea midline. No masses, lumps or thyromegaly present.  Cardiovascular: Normal rate and rhythm. S1,S2 noted.  No murmur, rubs or gallops noted. No JVD or BLE edema. No carotid bruits noted. Pulmonary/Chest: Normal effort and positive vesicular breath sounds. No respiratory distress. No wheezes, rales or ronchi noted.  Abdomen: Soft and nontender. Normal bowel sounds. No distention or masses noted. Liver, spleen and kidneys non palpable. Musculoskeletal: Strength 5/5 BUE/BLE. No difficulty with gait.  Neurological: Alert and oriented. Cranial nerves  II-XII grossly intact. Coordination normal.  Psychiatric: Mood and affect normal. Behavior is normal. Judgment and thought content normal.     BMET    Component Value Date/Time   NA 138 03/24/2019 0732   K 3.9 03/24/2019 0732   CL 100 03/24/2019 0732   CO2 27 03/24/2019 0732   GLUCOSE 156 (H) 03/24/2019 0732   BUN 17 03/24/2019 0732   CREATININE 0.83 03/24/2019 0732   CREATININE 0.85 09/27/2016 0854   CALCIUM 9.5 03/24/2019 0732   GFRNONAA 76 09/27/2016 0854   GFRAA 87 09/27/2016 0854    Lipid Panel     Component Value Date/Time   CHOL 138 07/02/2019 0741   TRIG 191.0 (H) 07/02/2019 0741   HDL 47.70 07/02/2019 0741   CHOLHDL 3 07/02/2019 0741   VLDL 38.2 07/02/2019 0741   LDLCALC 52 07/02/2019 0741    CBC    Component Value Date/Time   WBC 6.8 12/15/2018 0852   RBC 4.54 12/15/2018 0852   HGB 13.2 12/15/2018 0852   HCT 39.7 12/15/2018 0852   PLT 291.0 12/15/2018 0852   MCV 87.6 12/15/2018 0852   MCH 29.2 01/07/2014 1402   MCHC 33.1 12/15/2018 0852   RDW 13.0 12/15/2018 0852   LYMPHSABS 3.1 01/07/2014 1402   MONOABS 0.6 01/07/2014 1402   EOSABS 0.1 01/07/2014 1402   BASOSABS 0.0 01/07/2014 1402    Hgb A1C Lab Results  Component Value Date   HGBA1C 6.9 (A) 09/29/2019           Assessment & Plan:   Preventative Health Maintenance:  Encouraged her to get a flu shot the fall Tetanus UTD Encouraged her to get the Covid vaccine Pneumovax UTD Zostavax UTD Encouraged her to call her insurance about a Shingrix vaccine Mammogram UTD, will request copy Pap smear UTD, will request copy Bone density ordered, she will schedule [facility] for 09[facility]2020 Colon screening UTD Encouraged her to consume a balanced diet and exercise regimen Advised her to see an eye doctor and dentist annually We  will check CBC, C met and lipid profile today  RTC in 1 year, sooner if needed Webb Silversmith, NP This visit occurred during the SARS-CoV-2 public health emergency.  Safety  protocols were in place, including screening questions prior to the visit, additional usage of staff PPE, and extensive cleaning of exam room while observing appropriate contact time as indicated for disinfecting solutions.

## 2019-12-17 NOTE — Assessment & Plan Note (Signed)
CBC, C met, lipid and A1c today We will check urine microalbumin at next visit Encouraged her to consume a low carb diet and exercise for weight loss Continue Glipizide, Metformin, Trulicity, Tresbia We will request a copy of her eye exam Encourage routine foot exams Flu and Pneumovax UTD Encouraged her to get a Covid vaccine

## 2019-12-17 NOTE — Assessment & Plan Note (Signed)
Continue Diazepam as needed She will continue to follow with ENT

## 2020-01-03 ENCOUNTER — Other Ambulatory Visit: Payer: Self-pay | Admitting: Internal Medicine

## 2020-01-21 DIAGNOSIS — L738 Other specified follicular disorders: Secondary | ICD-10-CM | POA: Diagnosis not present

## 2020-01-21 DIAGNOSIS — D225 Melanocytic nevi of trunk: Secondary | ICD-10-CM | POA: Diagnosis not present

## 2020-01-21 DIAGNOSIS — B078 Other viral warts: Secondary | ICD-10-CM | POA: Diagnosis not present

## 2020-01-21 DIAGNOSIS — L820 Inflamed seborrheic keratosis: Secondary | ICD-10-CM | POA: Diagnosis not present

## 2020-01-21 DIAGNOSIS — Z1283 Encounter for screening for malignant neoplasm of skin: Secondary | ICD-10-CM | POA: Diagnosis not present

## 2020-02-04 ENCOUNTER — Other Ambulatory Visit: Payer: Self-pay | Admitting: Internal Medicine

## 2020-02-04 ENCOUNTER — Encounter: Payer: Self-pay | Admitting: Internal Medicine

## 2020-02-04 ENCOUNTER — Ambulatory Visit (INDEPENDENT_AMBULATORY_CARE_PROVIDER_SITE_OTHER): Payer: Medicare HMO | Admitting: Internal Medicine

## 2020-02-04 ENCOUNTER — Other Ambulatory Visit: Payer: Self-pay

## 2020-02-04 VITALS — BP 136/82 | HR 95 | Ht 65.5 in | Wt 192.8 lb

## 2020-02-04 DIAGNOSIS — E782 Mixed hyperlipidemia: Secondary | ICD-10-CM | POA: Diagnosis not present

## 2020-02-04 DIAGNOSIS — Z794 Long term (current) use of insulin: Secondary | ICD-10-CM

## 2020-02-04 DIAGNOSIS — E1165 Type 2 diabetes mellitus with hyperglycemia: Secondary | ICD-10-CM

## 2020-02-04 DIAGNOSIS — E663 Overweight: Secondary | ICD-10-CM

## 2020-02-04 DIAGNOSIS — Z23 Encounter for immunization: Secondary | ICD-10-CM

## 2020-02-04 DIAGNOSIS — E785 Hyperlipidemia, unspecified: Secondary | ICD-10-CM

## 2020-02-04 LAB — POCT GLYCOSYLATED HEMOGLOBIN (HGB A1C): Hemoglobin A1C: 10.1 % — AB (ref 4.0–5.6)

## 2020-02-04 MED ORDER — ROSUVASTATIN CALCIUM 10 MG PO TABS: 10.0000 mg | ORAL_TABLET | Freq: Every day | ORAL | 3 refills | Status: DC

## 2020-02-04 MED ORDER — OZEMPIC (0.25 OR 0.5 MG/DOSE) 2 MG/1.5ML ~~LOC~~ SOPN: 0.5000 mg | PEN_INJECTOR | SUBCUTANEOUS | 3 refills | Status: DC

## 2020-02-04 NOTE — Progress Notes (Signed)
Patient ID: [name], female   DOB: [birth year], 64 y.o.   MRN: [id number]   This visit occurred during the SARS-CoV-2 public health emergency.  Safety protocols were in place, including screening questions prior to the visit, additional usage of staff PPE, and extensive cleaning of exam room while observing appropriate contact time as indicated for disinfecting solutions.   HPI: [name] is a 64 y.o.-year-old female, returning for f/u for DM2, dx in 2013, insulin-dependent since 2016, uncontrolled, without long term complications.   Last visit 4 months ago.  At today's visit, patient does not bring a log or her meter.  She also mentions that she relaxed her diet completely since last visit.  She is also not active at all.  She also could not afford Trulicity anymore.  She did notice that her sugars worsened.  Her husband, Alondria Mousseau, was also my patient - he had terminal liver cancer-died Jul 17, 2019.  Reviewed HbA1c levels: Lab Results  Component Value Date   HGBA1C 6.9 (A) 09/29/2019   HGBA1C 8.6 (H) 07/02/2019   HGBA1C 8.3 (A) 05/29/2019   HGBA1C 8.7 (H) 12/15/2018   HGBA1C 9.6 (A) 10/16/2018   HGBA1C 11.2 (A) 07/18/2018   HGBA1C 8.6 (H) 12/09/2017   HGBA1C 8.2 06/25/2017   HGBA1C 7.8 01/16/2017   HGBA1C 8.9 09/27/2016   HGBA1C 9.8 (H) 07/05/2016   HGBA1C 10.4 (H) 03/28/2016   HGBA1C 8.4 (H) 12/27/2015   HGBA1C 9.0 (H) 06/29/2015   HGBA1C 10.0 (H) 03/24/2015   HGBA1C 8.8 (H) 04/19/2014   HGBA1C 11.6 (H) 01/07/2014   HGBA1C 12.2 (H) 10/06/2013   HGBA1C 9.4 (H) 04/27/2013   Pt is on a regimen of: - Metformin  1000 mg 2x a day, with meals - Glipizide 10 mg 20-30 minutes before breakfast and dinner-restarted 10/2018 - Farxiga 10 mg before b'fast - Trulicity 6.60 mg weekly >> 1.5 mg weekly-started 07/2019 >> stopped 2/2 cost and nausea - for 2 mo - Tresiba 40 >> 44-46 units daily She had mm cramps from Invokana.  Pt checks her sugars 2-4 times a day: - am:  143-169 >>  110, 135-160 >> 109-130 >> 130-150 - 2h after b'fast: n/c >> 189-312 >> n/c >> 138-165 >> 200s - before lunch: 160 >> 120-130 >> n/c >> 151-179 >> n/c - 2h after lunch: <150 >> n/c >> 150-170 >> n/c - before dinner: 147-191, 212 >> n/c >> <150 >> n/c - 2h after dinner: 143-183 (pizza) >> 160-190 >> 153-190 >> 200-280 - bedtime: n/c >> 179  >> n/c - nighttime: n/c Lowest sugar was 110 x1, 135 >> 109 >> 130; it is unclear at which level she has hypoglycemia awareness. Highest sugar was 200 >> 190 >> 280.  Glucometer: OneTouch Ultra >> One T Verio Flex  Pt's meals are: - Breakfast: grits, meat or cereals - Lunch: sandwich or bowl of cereal - Dinner: 2-3x a week eats out: chicken, steak, ribs - Snacks: , fruit No sodas.  She drinks water with Crystal light  -No CKD, last BUN/creatinine:  Lab Results  Component Value Date   BUN 17 12/17/2019   BUN 17 03/24/2019   CREATININE 0.88 12/17/2019   CREATININE 0.83 03/24/2019   -+ HL; last set of lipids: Lab Results  Component Value Date   CHOL 151 12/17/2019   HDL 52.10 12/17/2019   LDLCALC 52 07/02/2019   LDLDIRECT 63.0 12/17/2019   TRIG 304.0 (H) 12/17/2019   CHOLHDL 3 12/17/2019  On Zetia, fenofibrate, Crestor. -  last eye exam was 01/2019: No DR; coming up 02/24/2020. - no  numbness and tingling in her feet.  She had vertigo >> seeing specialist at [facility] >> changed from meclizine to Valium.  She has a microphone in the right ear which transmits to the left.   ROS: Constitutional: no weight gain/no weight loss, + fatigue, no subjective hyperthermia, no subjective hypothermia, + increased urination Eyes: no blurry vision, no xerophthalmia ENT: no sore throat, no nodules palpated in neck, no dysphagia, no odynophagia, no hoarseness Cardiovascular: no CP/no SOB/no palpitations/no leg swelling Respiratory: no cough/no SOB/no wheezing Gastrointestinal: no N/no V/no D/no C/no acid reflux Musculoskeletal: no muscle aches/no joint  aches Skin: no rashes, no hair loss Neurological: no tremors/no numbness/no tingling/no dizziness  I reviewed pt's medications, allergies, PMH, social hx, family hx, and changes were documented in the history of present illness. Otherwise, unchanged from my initial visit note.  Past Medical History:  Diagnosis Date  . Blood transfusion    1984   [place]  . Chronic kidney disease    occasional renal stone  . Complication of anesthesia    woke during surgery (ablation) 2006?  . Diabetes mellitus    type 2 Diab- A1C-7  . GERD (gastroesophageal reflux disease)    occasional Protonix-will take protonix night before surgery  . Hypercholesteremia    on meds   Past Surgical History:  Procedure Laterality Date  . BLADDER SUSPENSION  12/08/2010   Procedure: TRANSVAGINAL TAPE (TVT) PROCEDURE;  Surgeon: Arloa Koh;  Location: [facility];  Service: Gynecology;  Laterality: N/A;  transvaginal tape with cystoscopy  . BLADDER SUSPENSION  01/30/2011   Procedure: TRANSVAGINAL TAPE (TVT) PROCEDURE;  Surgeon: Arloa Koh;  Location: [facility];  Service: Gynecology;  Laterality: N/A;  excision of excess sling from suprapubic incision and cystoscopy  . [place] SURGERY  2004  . CYSTOSCOPY  01/30/2011   Procedure: CYSTOSCOPY;  Surgeon: Arloa Koh;  Location: [facility];  Service: Gynecology;  Laterality: N/A;  . TONSILLECTOMY  1976  . transvaginal tape  8/12  . uterine ablation     Social History   Social History  . Marital status: Married    Spouse name: N/A  . Number of children: 2   Occupational History  . retired   Social History Main Topics  . Smoking status: Former Smoker    Packs/day: 0.75    Years: 30.00    Types: Cigarettes  . Smokeless tobacco: Never Used     Comment: recently quit 03/01/2015  . Alcohol use No  . Drug use: No   Current Outpatient Medications on File Prior to Visit  Medication Sig Dispense Refill  . B-D UF III MINI PEN NEEDLES 31G X 5 MM MISC USE TO INJECT  INSULIN DAILY 100 each 5  . Calcium Carbonate-Vitamin D (CALTRATE 600+D PO) Take by mouth.    . diazepam (VALIUM) 2 MG tablet Take 2 mg by mouth.    . Dulaglutide (TRULICITY) 1.5 WL/7.9GX SOPN Inject 1.5 mg into the skin once a week. 4 pen 5  . fenofibrate (TRICOR) 145 MG tablet Take 1 tablet (145 mg total) by mouth daily. 90 tablet 3  . glipiZIDE (GLUCOTROL) 10 MG tablet TAKE 1 TABLET (10 MG TOTAL) BY MOUTH 2 (TWO) TIMES DAILY BEFORE A MEAL. 180 tablet 3  . insulin degludec (TRESIBA FLEXTOUCH) 200 UNIT/ML FlexTouch Pen Inject 46 Units into the skin daily. 6 pen 3  . metFORMIN (GLUCOPHAGE) 1000 MG tablet TAKE 1  TABLET (1,000 MG TOTAL) BY MOUTH 2 (TWO) TIMES DAILY WITH A MEAL. 180 tablet 2  . Multiple Vitamins-Minerals (MULTIVITAMINS THER. W/MINERALS) TABS Take 1 tablet by mouth daily.      . Omega-3 Fatty Acids (FISH OIL) 500 MG CAPS Take 500 mg by mouth daily.    Marland Kitchen OVER THE COUNTER MEDICATION Take 1 tablet by mouth 2 (two) times daily. PATIENT TAKES CINNAMON TABLETS 1000mg     . pantoprazole (PROTONIX) 40 MG tablet TAKE 1 TABLET BY MOUTH EVERY DAY 90 tablet 2   No current facility-administered medications on file prior to visit.   Allergies  Allergen Reactions  . Jardiance [Empagliflozin]     Myalgias   Family History  Problem Relation Age of Onset  . Hypertension Mother   . Hyperlipidemia Mother   . Heart disease Mother   . Heart disease Father   . Hyperlipidemia Father   . Diabetes Father   . Heart attack Father   . Varicose Veins Father   . Peripheral vascular disease Father        amputation  . Cancer Paternal Aunt        lung/ brain    PE: BP 136/82 (BP Location: Left Arm, Patient Position: Sitting, Cuff Size: Small)   Pulse 95   Ht 5' 5.5" (1.664 m)   Wt 192 lb 12.8 oz (87.5 kg)   SpO2 97%   BMI 31.60 kg/m  Wt Readings from Last 3 Encounters:  02/04/20 192 lb 12.8 oz (87.5 kg)  12/17/19 189 lb (85.7 kg)  09/29/19 189 lb 8 oz (86 kg)   Constitutional: overweight,  in NAD Eyes: PERRLA, EOMI, no exophthalmos ENT: moist mucous membranes, no thyromegaly, no cervical lymphadenopathy Cardiovascular: tachycardia, RR, No MRG Respiratory: CTA B Gastrointestinal: abdomen soft, NT, ND, BS+ Musculoskeletal: no deformities, strength intact in all 4 Skin: moist, warm, no rashes Neurological: no tremor with outstretched hands, DTR normal in all 4  ASSESSMENT: 1. DM2, insulin-dependent, uncontrolled, without long term complications, but with hyperglycemia  2. Overweight  3. HL  PLAN:  1. Patient with longstanding, uncontrolled, type 2 diabetes, on oral antidiabetic regimen with Metformin, sulfonylurea, SGLT2 inhibitor and also daily long-acting insulin and weekly GLP-1 receptor agonist.  She was on Invokana and Farxiga in the past could not tolerate these due to muscle aches.  However, we were then able to restart [place], which she is not tolerating well and staying hydrated.  At last visit she was having some nausea after the 1.5 mg Trulicity dose, but this was improving so we continued the same dose.  At last visit, HbA1c was improved, at 6.9%. -However, at this visit, her sugars have increased after relaxing her diet completely, not being active, and having had to stop the GLP-1 receptor agonist due to cost.  Next month (tomorrow), she will start the new insurance, for which Ozempic is on the formulary.  Therefore, at today's visit I gave her a sample of Ozempic pen and written prescription for this.  We will start at a low dose and increase to 0.5 mg weekly if tolerated she is determined to start improving her diet and start walking a mile a day.  This will help.  I advised her that in approximately 3 weeks let me know if sugars are not better, in which case we will need to start mealtime insulin. -  I suggested to:  Patient Instructions  Please  continue: - Metformin  1000 mg 2x a day, with meals - Glipizide  10 mg mg 20-30 minutes before b'fast and dinner -  Farxiga 10 mg before b'fast - Tresiba 44-46 units daily  Please start: - Ozempic 0.25 mg weekly x 2 weeks, then increase to 0.5 mg weekly  Please return in  3 months with your sugar log.  - we checked her HbA1c: 10.1% (MUCH higher)  - advised to check sugars at different times of the day - 2x a day, rotating check times - advised for yearly eye exams >> she is UTD and has an appointment coming up - return to clinic in 3 months    2. Overweight -continue SGLT 2 inhibitor and GLP-1 receptor agonist which should also help with weight loss -Stable weight before last visit -She gained 3 pounds since then  3. HL -Reviewed latest lipid panel from 12/2019: Fractions at goal with exception of high triglycerides: Lab Results  Component Value Date   CHOL 151 12/17/2019   HDL 52.10 12/17/2019   LDLCALC 52 07/02/2019   LDLDIRECT 63.0 12/17/2019   TRIG 304.0 (H) 12/17/2019   CHOLHDL 3 12/17/2019  -On TriCor 145 and fish oil 500 mg daily, rosuvastatin 10 (this was not on her medication list but I did add it today).  + flu shot today.  Philemon Kingdom, MD PhD [facility]

## 2020-02-04 NOTE — Patient Instructions (Addendum)
Please  continue: - Metformin  1000 mg 2x a day, with meals - Glipizide  10 mg mg 20-30 minutes before b'fast and dinner - Farxiga 10 mg before b'fast - Tresiba 44-46 units daily  Please start: - Ozempic 0.25 mg weekly x 2 weeks, then increase to 0.5 mg weekly  Please return in  3 months with your sugar log.

## 2020-02-15 ENCOUNTER — Telehealth: Payer: Self-pay

## 2020-02-15 NOTE — Telephone Encounter (Signed)
Morey Hummingbird, Can you please ask the patient to check with her insurance whether they cover Trulicity instead of Ozempic, and also   Basaglar, Lantus, Semglee, Levemir, Toujeo instead of [place].

## 2020-02-15 NOTE — Telephone Encounter (Signed)
Patient would like [place] to contact insurance to find out what they will cover. She thinks they will cover Lantus but no sure about the others. [place] [id number], [phone]. Told patient I will try to contact them tomorrow when I return to office.

## 2020-02-15 NOTE — Telephone Encounter (Signed)
Please review alternatives medication and prescribe is acceptable.

## 2020-02-15 NOTE — Telephone Encounter (Signed)
New message    Pt c/o medication issue:  1. Name of Medication: insulin degludec (TRESIBA FLEXTOUCH) 200 UNIT/ML FlexTouch Pen & Semaglutide,0.25 or 0.5MG /DOS, (OZEMPIC, 0.25 OR 0.5 MG/DOSE,) 2 MG/1.5ML SOPN  2. How are you currently taking this medication (dosage and times per day)? No   3. Are you having a reaction (difficulty breathing--STAT)? No   4. What is your medication issue?  Both medication is putting her in a doughnut hole $ 400.00 a month .. looking for alternative that insurance will paid for both medication.   5. Insurance is suggestion is [place]   6. Insurance is suggestion to switch to [employer] and Humalog

## 2020-02-17 ENCOUNTER — Other Ambulatory Visit: Payer: Self-pay | Admitting: Internal Medicine

## 2020-02-17 MED ORDER — VICTOZA 18 MG/3ML ~~LOC~~ SOPN: 1.8000 mg | PEN_INJECTOR | Freq: Every day | SUBCUTANEOUS | 3 refills | Status: DC

## 2020-02-17 MED ORDER — TOUJEO MAX SOLOSTAR 300 UNIT/ML ~~LOC~~ SOPN: PEN_INJECTOR | SUBCUTANEOUS | 3 refills | Status: DC

## 2020-02-17 MED ORDER — LANTUS SOLOSTAR 100 UNIT/ML ~~LOC~~ SOPN: 44.0000 [IU] | PEN_INJECTOR | Freq: Every day | SUBCUTANEOUS | 2 refills | Status: DC

## 2020-02-17 MED ORDER — TRULICITY 1.5 MG/0.5ML ~~LOC~~ SOAJ: SUBCUTANEOUS | 3 refills | Status: DC

## 2020-02-17 NOTE — Telephone Encounter (Signed)
[name], We can change to: - Lantus 44 units at bedtime - please send 30 ml (2 boxes) with 3 refills. -Trulicity 1.5 mg weekly - please send 12 pens with 3 refills Ty! C

## 2020-02-17 NOTE — Telephone Encounter (Signed)
Change? Or try PA?  Thanks!

## 2020-02-17 NOTE — Addendum Note (Signed)
Addended by: Jefferson Fuel on: 02/17/2020 10:15 AM   Modules accepted: Orders

## 2020-02-17 NOTE — Telephone Encounter (Signed)
Patient thinks that insurance will cover the Lantus ,Trulicity, or Levemir.

## 2020-02-17 NOTE — Telephone Encounter (Signed)
Toujeo and Victoza sent to the pharmacy. Per patient these should be covered. Patient is aware that they have been sent

## 2020-02-19 ENCOUNTER — Other Ambulatory Visit: Payer: Self-pay | Admitting: Internal Medicine

## 2020-02-19 MED ORDER — TOUJEO MAX SOLOSTAR 300 UNIT/ML ~~LOC~~ SOPN
PEN_INJECTOR | SUBCUTANEOUS | 3 refills | Status: DC
Start: 2020-02-19 — End: 2020-09-26

## 2020-02-23 DIAGNOSIS — R42 Dizziness and giddiness: Secondary | ICD-10-CM | POA: Diagnosis not present

## 2020-02-23 DIAGNOSIS — Z974 Presence of external hearing-aid: Secondary | ICD-10-CM | POA: Diagnosis not present

## 2020-02-23 DIAGNOSIS — H903 Sensorineural hearing loss, bilateral: Secondary | ICD-10-CM | POA: Diagnosis not present

## 2020-02-23 DIAGNOSIS — H6123 Impacted cerumen, bilateral: Secondary | ICD-10-CM | POA: Diagnosis not present

## 2020-02-24 DIAGNOSIS — H52223 Regular astigmatism, bilateral: Secondary | ICD-10-CM | POA: Diagnosis not present

## 2020-02-24 DIAGNOSIS — Z7984 Long term (current) use of oral hypoglycemic drugs: Secondary | ICD-10-CM | POA: Diagnosis not present

## 2020-02-24 DIAGNOSIS — H11159 Pinguecula, unspecified eye: Secondary | ICD-10-CM | POA: Diagnosis not present

## 2020-02-24 DIAGNOSIS — E119 Type 2 diabetes mellitus without complications: Secondary | ICD-10-CM | POA: Diagnosis not present

## 2020-02-24 DIAGNOSIS — Z0100 Encounter for examination of eyes and vision without abnormal findings: Secondary | ICD-10-CM | POA: Diagnosis not present

## 2020-02-24 DIAGNOSIS — H524 Presbyopia: Secondary | ICD-10-CM | POA: Diagnosis not present

## 2020-02-24 DIAGNOSIS — H5203 Hypermetropia, bilateral: Secondary | ICD-10-CM | POA: Diagnosis not present

## 2020-02-25 DIAGNOSIS — Z01419 Encounter for gynecological examination (general) (routine) without abnormal findings: Secondary | ICD-10-CM | POA: Diagnosis not present

## 2020-04-08 ENCOUNTER — Other Ambulatory Visit: Payer: Self-pay | Admitting: Internal Medicine

## 2020-04-08 DIAGNOSIS — E785 Hyperlipidemia, unspecified: Secondary | ICD-10-CM

## 2020-05-09 ENCOUNTER — Encounter: Payer: Self-pay | Admitting: Internal Medicine

## 2020-05-09 ENCOUNTER — Other Ambulatory Visit: Payer: Self-pay

## 2020-05-09 ENCOUNTER — Ambulatory Visit: Payer: Medicare HMO | Admitting: Internal Medicine

## 2020-05-09 VITALS — BP 142/74 | HR 82 | Ht 65.0 in | Wt 190.0 lb

## 2020-05-09 DIAGNOSIS — E1165 Type 2 diabetes mellitus with hyperglycemia: Secondary | ICD-10-CM

## 2020-05-09 DIAGNOSIS — E782 Mixed hyperlipidemia: Secondary | ICD-10-CM | POA: Diagnosis not present

## 2020-05-09 DIAGNOSIS — E663 Overweight: Secondary | ICD-10-CM | POA: Diagnosis not present

## 2020-05-09 DIAGNOSIS — Z794 Long term (current) use of insulin: Secondary | ICD-10-CM | POA: Diagnosis not present

## 2020-05-09 LAB — POCT GLYCOSYLATED HEMOGLOBIN (HGB A1C): Hemoglobin A1C: 11.4 % — AB (ref 4.0–5.6)

## 2020-05-09 MED ORDER — BD PEN NEEDLE MINI U/F 31G X 5 MM MISC
3 refills | Status: DC
Start: 2020-05-09 — End: 2022-02-06

## 2020-05-09 MED ORDER — NOVOLOG FLEXPEN 100 UNIT/ML ~~LOC~~ SOPN: 8.0000 [IU] | PEN_INJECTOR | Freq: Three times a day (TID) | SUBCUTANEOUS | 11 refills | Status: DC

## 2020-05-09 NOTE — Progress Notes (Signed)
Patient ID: [name], female   DOB: 12/16/1955, 65 y.o.   MRN: [id number]   This visit occurred during the SARS-CoV-2 public health emergency.  Safety protocols were in place, including screening questions prior to the visit, additional usage of staff PPE, and extensive cleaning of exam room while observing appropriate contact time as indicated for disinfecting solutions.   HPI: [name] is a 65 y.o.-year-old female, returning for f/u for DM2, dx in 2013, insulin-dependent since 2016, uncontrolled, without long term complications.   Last visit 3 months ago.  At last visit, sugars were worse as she relaxed her diet and also could not afford Trulicity anymore.  We switched to Ozempic, however, she could not tolerate this due to nausea.  Reviewed HbA1c levels: Lab Results  Component Value Date   HGBA1C 10.1 (A) 02/04/2020   HGBA1C 6.9 (A) 09/29/2019   HGBA1C 8.6 (H) 07/02/2019   HGBA1C 8.3 (A) 05/29/2019   HGBA1C 8.7 (H) 12/15/2018   HGBA1C 9.6 (A) 10/16/2018   HGBA1C 11.2 (A) 07/18/2018   HGBA1C 8.6 (H) 12/09/2017   HGBA1C 8.2 06/25/2017   HGBA1C 7.8 01/16/2017   HGBA1C 8.9 09/27/2016   HGBA1C 9.8 (H) 07/05/2016   HGBA1C 10.4 (H) 03/28/2016   HGBA1C 8.4 (H) 12/27/2015   HGBA1C 9.0 (H) 06/29/2015   HGBA1C 10.0 (H) 03/24/2015   HGBA1C 8.8 (H) 04/19/2014   HGBA1C 11.6 (H) 01/07/2014   HGBA1C 12.2 (H) 10/06/2013   HGBA1C 9.4 (H) 04/27/2013   Pt is on a regimen of: - Metformin  1000 mg 2x a day, with meals - Glipizide 10 mg 20-30 minutes before breakfast and dinner-restarted 10/2018 - Farxiga 10 mg before b'fast - Trulicity [id number] mg weekly >> 1.5 mg weekly-started 07/2019 >> stopped 2/2 cost and nausea - for 2 mo >> Ozempic 0.5 mg weekly - started 02/2020 >> but stopped 2/2 nausea (also, this is very expensive - Tresiba 40 >> 44-46 units daily >> now Basaglar 44 units daily She had mm cramps from Invokana.  Pt checks her sugars 2-4 times a day: - am: 110, 135-160 >> 109-130  >> 130-150 >> 126-143 on Ozempic; 150-330 now - 2h after b'fast: 189-312 >> n/c >> 138-165 >> 200s >> n/c - before lunch: 120-130 >> n/c >> 151-179 >> n/c >> 153-182 on Ozempic; 150-250 now - 2h after lunch: <150 >> n/c >> 150-170 >> n/c - before dinner: 147-191, 212 >> n/c >> <150 >> n/c >> 164-212 on Ozempic; 150-250 now - 2h after dinner: 160-190 >> 153-190 >> 200-280 >> 197 on Ozempic - bedtime: n/c >> 179  >> n/c - nighttime: n/c Lowest sugar was 109 >> 130 >> 126; it is unclear at which ever she has hypoglycemia awareness. Highest sugar was190 >> 280 >> 330.  Glucometer: OneTouch Ultra >> One T Verio Flex  Pt's meals are: - Breakfast: grits, meat or cereals - Lunch: sandwich or bowl of cereal - Dinner: 2-3x a week eats out: chicken, steak, ribs - Snacks: , fruit No sodas.  She drinks water with Crystal light  -No CKD, last BUN/creatinine:  Lab Results  Component Value Date   BUN 17 12/17/2019   BUN 17 03/24/2019   CREATININE 0.88 12/17/2019   CREATININE 0.83 03/24/2019   -+ HL; last set of lipids: Lab Results  Component Value Date   CHOL 151 12/17/2019   HDL 52.10 12/17/2019   LDLCALC 52 07/02/2019   LDLDIRECT 63.0 12/17/2019   TRIG 304.0 (H) 12/17/2019  CHOLHDL 3 12/17/2019  On Crestor, fenofibrate, fish oil - last eye exam was 02/24/2020: No DR reportedly. - no numbness and tingling in her feet.  She had vertigo >> seeing specialist at [facility] >> changed from meclizine to Valium.  She has a microphone in the right ear which transmits to the left.   Her husband, [name], was also my patient - he had terminal liver cancer-died 2019-06-13.  ROS: Constitutional: no weight gain/no weight loss, no fatigue, no subjective hyperthermia, no subjective hypothermia, + insomnia Eyes: no blurry vision, no xerophthalmia ENT: no sore throat, no nodules palpated in neck, no dysphagia, no odynophagia, no hoarseness Cardiovascular: no CP/no SOB/no palpitations/no leg  swelling Respiratory: no cough/no SOB/no wheezing Gastrointestinal: no N/no V/no D/no C/no acid reflux Musculoskeletal: no muscle aches/no joint aches Skin: no rashes, no hair loss Neurological: no tremors/no numbness/no tingling/no dizziness  I reviewed pt's medications, allergies, PMH, social hx, family hx, and changes were documented in the history of present illness. Otherwise, unchanged from my initial visit note.  Past Medical History:  Diagnosis Date  . Blood transfusion    1984   [facility]  . Chronic kidney disease    occasional renal stone  . Complication of anesthesia    woke during surgery (ablation) 2006?  . Diabetes mellitus    type 2 Diab- A1C-7  . GERD (gastroesophageal reflux disease)    occasional Protonix-will take protonix night before surgery  . Hypercholesteremia    on meds   Past Surgical History:  Procedure Laterality Date  . BLADDER SUSPENSION  12/08/2010   Procedure: TRANSVAGINAL TAPE (TVT) PROCEDURE;  Surgeon: Melony Overly;  Location: [facility];  Service: Gynecology;  Laterality: N/A;  transvaginal tape with cystoscopy  . BLADDER SUSPENSION  01/30/2011   Procedure: TRANSVAGINAL TAPE (TVT) PROCEDURE;  Surgeon: Melony Overly;  Location: [facility];  Service: Gynecology;  Laterality: N/A;  excision of excess sling from suprapubic incision and cystoscopy  . CERVICAL DISC SURGERY  2004  . CYSTOSCOPY  01/30/2011   Procedure: CYSTOSCOPY;  Surgeon: Melony Overly;  Location: [facility];  Service: Gynecology;  Laterality: N/A;  . TONSILLECTOMY  1976  . transvaginal tape  8/12  . uterine ablation     Social History   Social History  . Marital status: Married    Spouse name: N/A  . Number of children: 2   Occupational History  . retired   Social History Main Topics  . Smoking status: Former Smoker    Packs/day: 0.75    Years: 30.00    Types: Cigarettes  . Smokeless tobacco: Never Used     Comment: recently quit 03/01/2015  . Alcohol use No  . Drug use: No   Current  Outpatient Medications on File Prior to Visit  Medication Sig Dispense Refill  . B-D UF III MINI PEN NEEDLES 31G X 5 MM MISC USE TO INJECT INSULIN DAILY 100 each 5  . Calcium Carbonate-Vitamin D (CALTRATE 600+D PO) Take by mouth.    . diazepam (VALIUM) 2 MG tablet Take 2 mg by mouth.    . Dulaglutide (TRULICITY) 1.5 MG/0.5ML SOPN Inject 1.5 weekly into the skiin 12 mL 3  . fenofibrate (TRICOR) 145 MG tablet TAKE 1 TABLET BY MOUTH EVERY DAY 90 tablet 1  . glipiZIDE (GLUCOTROL) 10 MG tablet TAKE 1 TABLET (10 MG TOTAL) BY MOUTH 2 (TWO) TIMES DAILY BEFORE A MEAL. 180 tablet 3  . insulin glargine, 2 Unit Dial, (TOUJEO MAX SOLOSTAR) 300 UNIT/ML  Solostar Pen Inject 44 units on the skin at bedtime. 9 mL 3  . liraglutide (VICTOZA) 18 MG/3ML SOPN Inject 1.8 mg into the skin daily. 9 mL 3  . metFORMIN (GLUCOPHAGE) 1000 MG tablet TAKE 1 TABLET (1,000 MG TOTAL) BY MOUTH 2 (TWO) TIMES DAILY WITH A MEAL. 180 tablet 2  . Multiple Vitamins-Minerals (MULTIVITAMINS THER. W/MINERALS) TABS Take 1 tablet by mouth daily.      . Omega-3 Fatty Acids (FISH OIL) 500 MG CAPS Take 500 mg by mouth daily.    Marland Kitchen OVER THE COUNTER MEDICATION Take 1 tablet by mouth 2 (two) times daily. PATIENT TAKES CINNAMON TABLETS 1000mg     . pantoprazole (PROTONIX) 40 MG tablet TAKE 1 TABLET BY MOUTH EVERY DAY 90 tablet 2  . rosuvastatin (CRESTOR) 10 MG tablet TAKE 1 TABLET BY MOUTH EVERY DAY 90 tablet 1  . rosuvastatin (CRESTOR) 10 MG tablet Take 1 tablet (10 mg total) by mouth daily. 90 tablet 3  . Semaglutide,0.25 or 0.5MG /DOS, (OZEMPIC, 0.25 OR 0.5 MG/DOSE,) 2 MG/1.5ML SOPN Inject 0.5 mg into the skin once a week. 4.5 mL 3   No current facility-administered medications on file prior to visit.   Allergies  Allergen Reactions  . Jardiance [Empagliflozin]     Myalgias   Family History  Problem Relation Age of Onset  . Hypertension Mother   . Hyperlipidemia Mother   . Heart disease Mother   . Heart disease Father   . Hyperlipidemia  Father   . Diabetes Father   . Heart attack Father   . Varicose Veins Father   . Peripheral vascular disease Father        amputation  . Cancer Paternal Aunt        lung/ brain   PE: BP (!) 142/74   Pulse 82   Ht 5\' 5"  (1.651 m)   Wt 190 lb (86.2 kg)   SpO2 94%   BMI 31.62 kg/m  Wt Readings from Last 3 Encounters:  05/09/20 190 lb (86.2 kg)  02/04/20 192 lb 12.8 oz (87.5 kg)  12/17/19 189 lb (85.7 kg)   Constitutional: overweight, in NAD Eyes: PERRLA, EOMI, no exophthalmos ENT: moist mucous membranes, no thyromegaly, no cervical lymphadenopathy Cardiovascular: RRR, No MRG Respiratory: CTA B Gastrointestinal: abdomen soft, NT, ND, BS+ Musculoskeletal: no deformities, strength intact in all 4 Skin: moist, warm, no rashes Neurological: no tremor with outstretched hands, DTR normal in all 4  ASSESSMENT: 1. DM2, insulin-dependent, uncontrolled, without long term complications, but with hyperglycemia  2. Overweight  3. HL  PLAN:  1. Patient with longstanding, uncontrolled, type 2 diabetes, on oral antidiabetic regimen with Metformin, sulfonylurea, SGLT2 inhibitor, and also daily long-acting insulin and weekly GLP-1 receptor agonist.  She was on Invokana and Farxiga in the past but could not tolerate these due to muscle aches.  However, she is tolerating [place] well.  In the past, she had some nausea with Trulicity, but these improved, however, at last visit, she could not afford it.  I gave her a sample of Ozempic at last visit and also a written prescription for it if she tolerates it well and if this was covered by her insurance.  We discussed that if she cannot get it or if this is not enough, we may need to start mealtime insulin.  Last visit, HbA1c was much higher, at 10.1%, increased from 6.9% -At today's visit, she tells me that she started Ozempic, although this was expensive, but she could not tolerate it from  the first dose.  She continues to try to take it for a month,  but after this, she had to stop due to persistent nausea.  Sugars were relatively well controlled on Ozempic, but they started to increase to 300s after she stopped the medication, despite continuing the rest of her regimen. -At this visit, sugars are elevated throughout the day, but she mentions that she started to go to the gym and she plans to start weight watchers soon.  Also, she is ready to pay out-of-pocket for Trulicity, if still needed.  However, for now, with a very high blood sugars in the very high HbA1c (see below), I suggested to add mealtime insulin at next visit, if sugars improved, we can try to transition from mealtime insulin to Trulicity, if affordable.  We discussed about how to dose NovoLog based on the size of her meals. -  I suggested to:  Patient Instructions  Please  continue: - Metformin  1000 mg 2x a day, with meals - Glipizide 10 mg mg 20-30 minutes before b'fast and dinner - Farxiga 10 mg before b'fast - Basaglar 44 units daily  Please start:  - Novolog 8-10 units 15 min before each main meal  Please return in  3 months with your sugar log.  - we checked her HbA1c: 11.4% (higher)  - advised to check sugars at different times of the day - 3x a day, rotating check times - advised for yearly eye exams >> she is UTD - return to clinic in 3 months   2. Overweight -continue SGLT 2 inhibitor and GLP-1 receptor agonist which should also help with weight loss -She gained 3 pounds before last visit - lost 2 lbs since last OV  3. HL -Reviewed latest lipid panel from 12/2019: Fractions at goal with exception of high triglycerides: Lab Results  Component Value Date   CHOL 151 12/17/2019   HDL 52.10 12/17/2019   LDLCALC 52 07/02/2019   LDLDIRECT 63.0 12/17/2019   TRIG 304.0 (H) 12/17/2019   CHOLHDL 3 12/17/2019  -On Crestor 10, TriCor 145 and fish oil 500 mg daily, tolerated well   Philemon Kingdom, MD PhD [facility] Endocrinology

## 2020-05-09 NOTE — Patient Instructions (Signed)
Please  continue: - Metformin  1000 mg 2x a day, with meals - Glipizide 10 mg mg 20-30 minutes before b'fast and dinner - Farxiga 10 mg before b'fast - Basaglar 44 units daily  Please start:  - Novolog 8-10 units 15 min before each main meal  Please return in  3 months with your sugar log.

## 2020-06-30 ENCOUNTER — Other Ambulatory Visit: Payer: Self-pay | Admitting: Internal Medicine

## 2020-06-30 DIAGNOSIS — Z794 Long term (current) use of insulin: Secondary | ICD-10-CM

## 2020-06-30 DIAGNOSIS — E1165 Type 2 diabetes mellitus with hyperglycemia: Secondary | ICD-10-CM

## 2020-07-01 DIAGNOSIS — Z7984 Long term (current) use of oral hypoglycemic drugs: Secondary | ICD-10-CM | POA: Diagnosis not present

## 2020-07-01 DIAGNOSIS — E1165 Type 2 diabetes mellitus with hyperglycemia: Secondary | ICD-10-CM | POA: Diagnosis not present

## 2020-07-01 DIAGNOSIS — E785 Hyperlipidemia, unspecified: Secondary | ICD-10-CM | POA: Diagnosis not present

## 2020-07-01 DIAGNOSIS — Z6832 Body mass index (BMI) 32.0-32.9, adult: Secondary | ICD-10-CM | POA: Diagnosis not present

## 2020-07-01 DIAGNOSIS — E669 Obesity, unspecified: Secondary | ICD-10-CM | POA: Diagnosis not present

## 2020-07-01 DIAGNOSIS — R030 Elevated blood-pressure reading, without diagnosis of hypertension: Secondary | ICD-10-CM | POA: Diagnosis not present

## 2020-07-01 DIAGNOSIS — Z809 Family history of malignant neoplasm, unspecified: Secondary | ICD-10-CM | POA: Diagnosis not present

## 2020-07-01 DIAGNOSIS — K219 Gastro-esophageal reflux disease without esophagitis: Secondary | ICD-10-CM | POA: Diagnosis not present

## 2020-07-01 DIAGNOSIS — Z794 Long term (current) use of insulin: Secondary | ICD-10-CM | POA: Diagnosis not present

## 2020-07-01 DIAGNOSIS — Z7982 Long term (current) use of aspirin: Secondary | ICD-10-CM | POA: Diagnosis not present

## 2020-08-08 ENCOUNTER — Other Ambulatory Visit: Payer: Self-pay | Admitting: Internal Medicine

## 2020-08-08 DIAGNOSIS — E785 Hyperlipidemia, unspecified: Secondary | ICD-10-CM

## 2020-08-10 ENCOUNTER — Ambulatory Visit: Payer: Medicare HMO | Admitting: Internal Medicine

## 2020-08-19 ENCOUNTER — Other Ambulatory Visit: Payer: Self-pay | Admitting: Internal Medicine

## 2020-09-07 ENCOUNTER — Other Ambulatory Visit: Payer: Self-pay | Admitting: Internal Medicine

## 2020-09-25 ENCOUNTER — Other Ambulatory Visit: Payer: Self-pay | Admitting: Internal Medicine

## 2020-09-25 DIAGNOSIS — E1165 Type 2 diabetes mellitus with hyperglycemia: Secondary | ICD-10-CM

## 2020-09-26 ENCOUNTER — Encounter: Payer: Self-pay | Admitting: Internal Medicine

## 2020-09-26 ENCOUNTER — Other Ambulatory Visit: Payer: Self-pay

## 2020-09-26 ENCOUNTER — Ambulatory Visit: Payer: Medicare HMO | Admitting: Internal Medicine

## 2020-09-26 VITALS — BP 130/82 | HR 87 | Ht 65.0 in | Wt 192.6 lb

## 2020-09-26 DIAGNOSIS — Z794 Long term (current) use of insulin: Secondary | ICD-10-CM | POA: Diagnosis not present

## 2020-09-26 DIAGNOSIS — E663 Overweight: Secondary | ICD-10-CM | POA: Diagnosis not present

## 2020-09-26 DIAGNOSIS — E782 Mixed hyperlipidemia: Secondary | ICD-10-CM

## 2020-09-26 DIAGNOSIS — E1165 Type 2 diabetes mellitus with hyperglycemia: Secondary | ICD-10-CM | POA: Diagnosis not present

## 2020-09-26 LAB — POCT GLYCOSYLATED HEMOGLOBIN (HGB A1C): Hemoglobin A1C: 12.1 % — AB (ref 4.0–5.6)

## 2020-09-26 MED ORDER — TRULICITY 1.5 MG/0.5ML ~~LOC~~ SOAJ: SUBCUTANEOUS | 3 refills | Status: DC

## 2020-09-26 MED ORDER — METFORMIN HCL 1000 MG PO TABS: 1000.0000 mg | ORAL_TABLET | Freq: Two times a day (BID) | ORAL | 2 refills | Status: DC

## 2020-09-26 NOTE — Addendum Note (Signed)
Addended by: Tarin Johndrow on: 09/26/2020 05:11 PM   Modules accepted: Orders  

## 2020-09-26 NOTE — Progress Notes (Signed)
Patient ID: [name], female   DOB: 1956-02-19, 65 y.o.   MRN: [id number]   This visit occurred during the SARS-CoV-2 public health emergency.  Safety protocols were in place, including screening questions prior to the visit, additional usage of staff PPE, and extensive cleaning of exam room while observing appropriate contact time as indicated for disinfecting solutions.   HPI: [name] is a 65 y.o.-year-old female, returning for f/u for DM2, dx in 2013, insulin-dependent since 2016, uncontrolled, without long term complications.   Last visit 4.5 months ago.  Interim history: No increased urination, blurry vision, nausea. She feels that NovoLog is not effective for her as her sugars are very high.  Reviewed HbA1c levels: Lab Results  Component Value Date   HGBA1C 11.4 (A) 05/09/2020   HGBA1C 10.1 (A) 02/04/2020   HGBA1C 6.9 (A) 09/29/2019   HGBA1C 8.6 (H) 07/02/2019   HGBA1C 8.3 (A) 05/29/2019   HGBA1C 8.7 (H) 12/15/2018   HGBA1C 9.6 (A) 10/16/2018   HGBA1C 11.2 (A) 07/18/2018   HGBA1C 8.6 (H) 12/09/2017   HGBA1C 8.2 06/25/2017   HGBA1C 7.8 01/16/2017   HGBA1C 8.9 09/27/2016   HGBA1C 9.8 (H) 07/05/2016   HGBA1C 10.4 (H) 03/28/2016   HGBA1C 8.4 (H) 12/27/2015   HGBA1C 9.0 (H) 06/29/2015   HGBA1C 10.0 (H) 03/24/2015   HGBA1C 8.8 (H) 04/19/2014   HGBA1C 11.6 (H) 01/07/2014   HGBA1C 12.2 (H) 10/06/2013   HGBA1C 9.4 (H) 04/27/2013   Pt is on a regimen of: - Metformin  1000 >> 500 mg 2x a day, with meals - Glipizide 10 mg 20-30 minutes before breakfast and dinner-restarted 10/2018  but stopped 2/2 nausea (also, $$$) - Tresiba 40 >> 44-46 units daily >> now Basaglar 44 units daily - NovoLog 8 units before B and 10 units before D She had mm cramps from Invokana. We tried to start Farxiga 10 mg before b'fast >> did not get it from pharmacy.  Pt checks her sugars 2-4 times a day: - am: 109-130 >> 130-150 >> 126-143 on Ozempic; 150-330 now >> 175-220 - 2h after b'fast:  138-165 >> 200s >> n/c - before lunch: 151-179 >> n/c >> 153-182 on Ozempic; 150-250 now >> 200s - 2h after lunch: <150 >> n/c >> 150-170 >> n/c - before dinner:  n/c >> 164-212 on Ozempic; 150-250 now >> 215-350 - 2h after dinner: 153-190 >> 200-280 >> 197 on Ozempic >> n/c - bedtime: n/c >> 179  >> n/c - nighttime: n/c Lowest sugar was 109 >> 130 >> 126 >> 175; it is unclear at which ever she has hypoglycemia awareness. Highest sugar was190 >> 280 >> 330 >> 345.  Glucometer: OneTouch Ultra >> One T Verio Flex  Pt's meals are: - Breakfast: grits, meat or cereals - Lunch: sandwich or bowl of cereal - Dinner: 2-3x a week eats out: chicken, steak, ribs - Snacks: , fruit No sodas.  She drinks water with Crystal light  -No CKD, last BUN/creatinine:  Lab Results  Component Value Date   BUN 17 12/17/2019   BUN 17 03/24/2019   CREATININE 0.88 12/17/2019   CREATININE 0.83 03/24/2019   -+ HL; last set of lipids: Lab Results  Component Value Date   CHOL 151 12/17/2019   HDL 52.10 12/17/2019   LDLCALC 52 07/02/2019   LDLDIRECT 63.0 12/17/2019   TRIG 304.0 (H) 12/17/2019   CHOLHDL 3 12/17/2019  On Crestor, fenofibrate, fish oil - last eye exam was 02/24/2020: No DR reportedly. -  no numbness and tingling in her feet.  She had vertigo >> seeing specialist at [facility] >> changed from meclizine to Valium.  She has a microphone in the right ear which transmits to the left.   Her husband, Kirstie Larsen, was also my patient - he had terminal liver cancer-died 07-11-2019.  ROS: Constitutional: no weight gain/no weight loss, no fatigue, no subjective hyperthermia, no subjective hypothermia, + insomnia Eyes: no blurry vision, no xerophthalmia ENT: no sore throat, no nodules palpated in neck, no dysphagia, no odynophagia, no hoarseness Cardiovascular: no CP/no SOB/no palpitations/no leg swelling Respiratory: no cough/no SOB/no wheezing Gastrointestinal: no N/no V/no D/no C/no acid  reflux Musculoskeletal: + muscle aches/no joint aches Skin: no rashes, no hair loss Neurological: no tremors/no numbness/no tingling/no dizziness  I reviewed pt's medications, allergies, PMH, social hx, family hx, and changes were documented in the history of present illness. Otherwise, unchanged from my initial visit note.  Past Medical History:  Diagnosis Date  . Blood transfusion    1984   [facility]  . Chronic kidney disease    occasional renal stone  . Complication of anesthesia    woke during surgery (ablation) 2006?  . Diabetes mellitus    type 2 Diab- A1C-7  . GERD (gastroesophageal reflux disease)    occasional Protonix-will take protonix night before surgery  . Hypercholesteremia    on meds   Past Surgical History:  Procedure Laterality Date  . BLADDER SUSPENSION  12/08/2010   Procedure: TRANSVAGINAL TAPE (TVT) PROCEDURE;  Surgeon: Arloa Koh;  Location: [facility];  Service: Gynecology;  Laterality: N/A;  transvaginal tape with cystoscopy  . BLADDER SUSPENSION  01/30/2011   Procedure: TRANSVAGINAL TAPE (TVT) PROCEDURE;  Surgeon: Arloa Koh;  Location: [facility];  Service: Gynecology;  Laterality: N/A;  excision of excess sling from suprapubic incision and cystoscopy  . [place] SURGERY  2004  . CYSTOSCOPY  01/30/2011   Procedure: CYSTOSCOPY;  Surgeon: Arloa Koh;  Location: [facility];  Service: Gynecology;  Laterality: N/A;  . TONSILLECTOMY  1976  . transvaginal tape  8/12  . uterine ablation     Social History   Social History  . Marital status: Married    Spouse name: N/A  . Number of children: 2   Occupational History  . retired   Social History Main Topics  . Smoking status: Former Smoker    Packs/day: 0.75    Years: 30.00    Types: Cigarettes  . Smokeless tobacco: Never Used     Comment: recently quit 03/01/2015  . Alcohol use No  . Drug use: No   Current Outpatient Medications on File Prior to Visit  Medication Sig Dispense Refill  . B-D UF III MINI  PEN NEEDLES 31G X 5 MM MISC Use 4x a day 200 each 3  . Calcium Carbonate-Vitamin D (CALTRATE 600+D PO) Take by mouth.    . diazepam (VALIUM) 2 MG tablet Take 2 mg by mouth.    . Dulaglutide (TRULICITY) 1.5 PY/0.9XI SOPN Inject 1.5 weekly into the skiin 12 mL 3  . fenofibrate (TRICOR) 145 MG tablet TAKE 1 TABLET BY MOUTH EVERY DAY 90 tablet 1  . glipiZIDE (GLUCOTROL) 10 MG tablet TAKE 1 TABLET (10 MG TOTAL) BY MOUTH 2 (TWO) TIMES DAILY BEFORE A MEAL. 180 tablet 3  . insulin aspart (NOVOLOG FLEXPEN) 100 UNIT/ML FlexPen Inject 8-10 Units into the skin 3 (three) times daily with meals. 30 mL 11  . insulin glargine, 2 Unit Dial, (TOUJEO  MAX SOLOSTAR) 300 UNIT/ML Solostar Pen Inject 44 units on the skin at bedtime. 9 mL 3  . liraglutide (VICTOZA) 18 MG/3ML SOPN Inject 1.8 mg into the skin daily. 9 mL 3  . metFORMIN (GLUCOPHAGE) 1000 MG tablet TAKE 1 TABLET (1,000 MG TOTAL) BY MOUTH 2 (TWO) TIMES DAILY WITH A MEAL. 180 tablet 2  . Multiple Vitamins-Minerals (MULTIVITAMINS THER. W/MINERALS) TABS Take 1 tablet by mouth daily.    . Omega-3 Fatty Acids (FISH OIL) 500 MG CAPS Take 500 mg by mouth daily.    Marland Kitchen OVER THE COUNTER MEDICATION Take 1 tablet by mouth 2 (two) times daily. PATIENT TAKES CINNAMON TABLETS 1000mg     . pantoprazole (PROTONIX) 40 MG tablet TAKE 1 TABLET BY MOUTH EVERY DAY 90 tablet 2  . rosuvastatin (CRESTOR) 10 MG tablet Take 1 tablet (10 mg total) by mouth daily. 90 tablet 3  . rosuvastatin (CRESTOR) 10 MG tablet TAKE 1 TABLET BY MOUTH EVERY DAY 90 tablet 0  . Semaglutide,0.25 or 0.5MG /DOS, (OZEMPIC, 0.25 OR 0.5 MG/DOSE,) 2 MG/1.5ML SOPN Inject 0.5 mg into the skin once a week. (Patient not taking: Reported on 05/09/2020) 4.5 mL 3   No current facility-administered medications on file prior to visit.   Allergies  Allergen Reactions  . Jardiance [Empagliflozin]     Myalgias   Family History  Problem Relation Age of Onset  . Hypertension Mother   . Hyperlipidemia Mother   . Heart  disease Mother   . Heart disease Father   . Hyperlipidemia Father   . Diabetes Father   . Heart attack Father   . Varicose Veins Father   . Peripheral vascular disease Father        amputation  . Cancer Paternal Aunt        lung/ brain   PE: BP 130/82 (BP Location: Right Arm, Patient Position: Sitting, Cuff Size: Normal)   Pulse 87   Ht 5\' 5"  (1.651 m)   Wt 192 lb 9.6 oz (87.4 kg)   SpO2 96%   BMI 32.05 kg/m  Wt Readings from Last 3 Encounters:  09/26/20 192 lb 9.6 oz (87.4 kg)  05/09/20 190 lb (86.2 kg)  02/04/20 192 lb 12.8 oz (87.5 kg)   Constitutional: overweight, in NAD Eyes: PERRLA, EOMI, no exophthalmos ENT: moist mucous membranes, no thyromegaly, no cervical lymphadenopathy Cardiovascular: RRR, No MRG Respiratory: CTA B Gastrointestinal: abdomen soft, NT, ND, BS+ Musculoskeletal: no deformities, strength intact in all 4 Skin: moist, warm, no rashes Neurological: no tremor with outstretched hands, DTR normal in all 4  ASSESSMENT: 1. DM2, insulin-dependent, uncontrolled, without long term complications, but with hyperglycemia  2. Overweight  3. HL  PLAN:  1. Patient with longstanding, uncontrolled, type 2 diabetes, on a complex medication regimen including metformin, sulfonylurea,  and basal-bolus insulin regimen with worsening control in 01/2020, after coming off Trulicity due to price and then Ozempic due to nausea and also price.  At last visit, since sugars were still high we increased her NovoLog dose.  HbA1c was still very high, at 11.4%, higher than previous. -At this visit, sugars remain very high at all times of the day, but decreasing as the day goes by.  I explained that this is usually a sign of not enough NovoLog, not necessarily a sign that NovoLog is not effective for her.  We will increase the dose of NovoLog especially since she does not have any lows and, CG tells me that she also decreased metformin since last visit due  to negative publicity, we  discussed about increasing back the metformin especially since her kidney function is normal.  She is also willing to hold the retry Trulicity, which worked well for her. -  I suggested to:  Patient Instructions  Please increase: - Metformin to 1000 mg 2x a day with meals  Please restart: - Trulicity 1.5 mg weekly  Increase: - NovoLog 10-15 units before each meal  Continue: - Basaglar 44 units at night - Glipizide 10 mg before meals, 2x a day  Please return in 1.5 months with your sugar log.   - we checked her HbA1c: 12.1% (higher) - advised to check sugars atdifferent times of the day - 4x a day, rotating check times - advised for yearly eye exams >> she is UTD - return to clinic in 3 months  2. Overweight -continue SGLT 2 inhibitor and GLP-1 receptor agonist which should also help with weight loss -No significant weight loss before last visit  3. HL -Reviewed latest lipid panel from 12/2019: Fractions at goal with the exception of high triglycerides: Lab Results  Component Value Date   CHOL 151 12/17/2019   HDL 52.10 12/17/2019   LDLCALC 52 07/02/2019   LDLDIRECT 63.0 12/17/2019   TRIG 304.0 (H) 12/17/2019   CHOLHDL 3 12/17/2019  -On Crestor 10, Tricor 145 and fish oil 500 mg daily, which she tolerates well   Philemon Kingdom, MD PhD [facility] Endocrinology

## 2020-09-26 NOTE — Patient Instructions (Addendum)
Please increase: - Metformin to 1000 mg 2x a day with meals  Please restart: - Trulicity 1.5 mg weekly  Increase: - NovoLog 10-15 units before each meal  Continue: - Basaglar 44 units at night - Glipizide 10 mg before meals, 2x a day  Please return in 1.5 months with your sugar log.

## 2020-09-27 ENCOUNTER — Other Ambulatory Visit: Payer: Self-pay | Admitting: Internal Medicine

## 2020-10-05 DIAGNOSIS — Z1231 Encounter for screening mammogram for malignant neoplasm of breast: Secondary | ICD-10-CM | POA: Diagnosis not present

## 2020-10-05 LAB — HM MAMMOGRAPHY: HM Mammogram: NORMAL (ref 0–4)

## 2020-10-08 ENCOUNTER — Other Ambulatory Visit: Payer: Self-pay | Admitting: Internal Medicine

## 2020-10-08 DIAGNOSIS — E785 Hyperlipidemia, unspecified: Secondary | ICD-10-CM

## 2020-10-12 NOTE — Telephone Encounter (Signed)
Refill request Fenofibrate last refill 04/11/20 #90/1 Pantoprazole last refill 01/06/20 #90/2 Last office visit 12/17/19 No upcoming appointment scheduled

## 2020-11-06 ENCOUNTER — Other Ambulatory Visit: Payer: Self-pay | Admitting: Internal Medicine

## 2020-11-06 DIAGNOSIS — E785 Hyperlipidemia, unspecified: Secondary | ICD-10-CM

## 2020-11-09 NOTE — Telephone Encounter (Signed)
LAST APPOINTMENT DATE: CPE 12/17/2019   NEXT APPOINTMENT DATE: Visit date not found    LAST REFILL: 08/10/2020  QTY: 90 0 rf

## 2020-11-10 ENCOUNTER — Ambulatory Visit (INDEPENDENT_AMBULATORY_CARE_PROVIDER_SITE_OTHER): Payer: Medicare HMO | Admitting: Internal Medicine

## 2020-11-10 ENCOUNTER — Other Ambulatory Visit: Payer: Self-pay

## 2020-11-10 ENCOUNTER — Encounter: Payer: Self-pay | Admitting: Internal Medicine

## 2020-11-10 VITALS — BP 140/88 | HR 84 | Ht 65.0 in | Wt 190.8 lb

## 2020-11-10 DIAGNOSIS — Z794 Long term (current) use of insulin: Secondary | ICD-10-CM

## 2020-11-10 DIAGNOSIS — E782 Mixed hyperlipidemia: Secondary | ICD-10-CM | POA: Diagnosis not present

## 2020-11-10 DIAGNOSIS — E1165 Type 2 diabetes mellitus with hyperglycemia: Secondary | ICD-10-CM | POA: Diagnosis not present

## 2020-11-10 DIAGNOSIS — E663 Overweight: Secondary | ICD-10-CM

## 2020-11-10 MED ORDER — GLIPIZIDE 10 MG PO TABS: 5.0000 mg | ORAL_TABLET | Freq: Two times a day (BID) | ORAL | 3 refills | Status: DC

## 2020-11-10 NOTE — Patient Instructions (Addendum)
Please continue: - Metformin 1000 mg 2x a day with meals - Trulicity 1.5 mg weekly - Basaglar 44 units at night - NovoLog 10-15 units before each meal  Please decrease: - Glipizide 5 mg 2x a day before meals - for 2-3 weeks, then stop the am Glipizide   Please return in 2-3 months with your sugar log.

## 2020-11-10 NOTE — Progress Notes (Signed)
Patient ID: [name], female   DOB: 30-Sep-1955, 65 y.o.   MRN: [id number]   This visit occurred during the SARS-CoV-2 public health emergency.  Safety protocols were in place, including screening questions prior to the visit, additional usage of staff PPE, and extensive cleaning of exam room while observing appropriate contact time as indicated for disinfecting solutions.   HPI: [name] is a 65 y.o.-year-old female, returning for f/u for DM2, dx in 2013, insulin-dependent since 2016, uncontrolled, without long term complications.   Last visit 1.5 months ago.  Interim history: No increased urination, blurry vision, nausea, chest pain. She has noticed mild tremors in her thumbs when holding a cup. Since last visit, sugars improved significantly after starting Ozempic.  She also has decreased appetite, but no nausea.  Reviewed HbA1c levels: Lab Results  Component Value Date   HGBA1C 12.1 (A) 09/26/2020   HGBA1C 11.4 (A) 05/09/2020   HGBA1C 10.1 (A) 02/04/2020   HGBA1C 6.9 (A) 09/29/2019   HGBA1C 8.6 (H) 07/02/2019   HGBA1C 8.3 (A) 05/29/2019   HGBA1C 8.7 (H) 12/15/2018   HGBA1C 9.6 (A) 10/16/2018   HGBA1C 11.2 (A) 07/18/2018   HGBA1C 8.6 (H) 12/09/2017   HGBA1C 8.2 06/25/2017   HGBA1C 7.8 01/16/2017   HGBA1C 8.9 09/27/2016   HGBA1C 9.8 (H) 07/05/2016   HGBA1C 10.4 (H) 03/28/2016   HGBA1C 8.4 (H) 12/27/2015   HGBA1C 9.0 (H) 06/29/2015   HGBA1C 10.0 (H) 03/24/2015   HGBA1C 8.8 (H) 04/19/2014   HGBA1C 11.6 (H) 01/07/2014   HGBA1C 12.2 (H) 10/06/2013   HGBA1C 9.4 (H) 04/27/2013   Pt is on a regimen of: - Metformin  1000 >> 500 mg >> 1000 2x a day, with meals - Glipizide 10 mg 20-30 minutes before breakfast and dinner-restarted 02/1750 - Trulicity 1.5 mg weekly -restarted 09/2020 - decreased appetite, no more nausea - >> now Basaglar 44 units daily - NovoLog 8 units before B and 10 units before D >> 10-15 units before meals She had mm cramps from [employer]. We tried to start  Farxiga 10 mg before b'fast >> did not get it from pharmacy. She was previously on Trulicity, but stopped due to cost and also nausea.  We tried Ozempic but she again had problems with constant nausea.  In 09/2020, I again suggested to try a low-dose Trulicity.   Pt checks her sugars 2-4 times a day: - am: 126-143 on Ozempic; 150-330 now >> 175-220 >> 90x, 112-131 - 2h after b'fast: 138-165 >> 200s >> n/c >> 139-147 - before lunch: 153-182 on Ozempic; 150-250 now >> 200s  >> n/c - 2h after lunch: <150 >> n/c >> 150-170 >> n/c - before dinner:  164-212 on Ozempic; 150-250 now >> 215-350 >> n/c - 2h after dinner: 153-190 >> 200-280 >> 197 on Ozempic >> n/c - bedtime: n/c >> 179  >> n/c >> 161-181 - nighttime: n/c >> 212 Lowest sugar was 126 >> 175 >> 90s; it is unclear at which ever she has hypoglycemia awareness. Highest sugar was 330 >> 345 >> 212.  Glucometer: OneTouch Ultra >> One Touch Verio Flex  Pt's meals are: - Breakfast: grits, meat or cereals - Lunch: sandwich or bowl of cereal - Dinner: 2-3x a week eats out: chicken, steak, ribs - Snacks: , fruit -watermelon No sodas.  She drinks water with Crystal light  -No CKD, last BUN/creatinine:  Lab Results  Component Value Date   BUN 17 12/17/2019   BUN 17 03/24/2019  CREATININE 0.88 12/17/2019   CREATININE 0.83 03/24/2019   -+ HL; last set of lipids: Lab Results  Component Value Date   CHOL 151 12/17/2019   HDL 52.10 12/17/2019   LDLCALC 52 07/02/2019   LDLDIRECT 63.0 12/17/2019   TRIG 304.0 (H) 12/17/2019   CHOLHDL 3 12/17/2019  On Crestor, fenofibrate, fish oil  - last eye exam was 02/24/2020: No DR reportedly.  - no numbness and tingling in her feet.  She had vertigo >> seeing specialist at [facility] >> changed from meclizine to Valium.  She has a microphone in the right ear which transmits to the left.   Her husband, Murrel Freet, was also my patient - he had terminal liver cancer-died  08-Jul-2019.  ROS: Constitutional: no weight gain/no weight loss, no fatigue, no subjective hyperthermia, no subjective hypothermia, + insomnia Eyes: no blurry vision, no xerophthalmia ENT: no sore throat, no nodules palpated in neck, no dysphagia, no odynophagia, no hoarseness Cardiovascular: no CP/no SOB/no palpitations/no leg swelling Respiratory: no cough/no SOB/no wheezing Gastrointestinal: no N/no V/no D/no C/no acid reflux Musculoskeletal: no muscle aches/no joint aches Skin: no rashes, no hair loss Neurological: + tremors in thumbs/no numbness/no tingling/no dizziness  I reviewed pt's medications, allergies, PMH, social hx, family hx, and changes were documented in the history of present illness. Otherwise, unchanged from my initial visit note.  Past Medical History:  Diagnosis Date   Blood transfusion    1984   [facility]   Chronic kidney disease    occasional renal stone   Complication of anesthesia    woke during surgery (ablation) 2006?   Diabetes mellitus    type 2 Diab- A1C-7   GERD (gastroesophageal reflux disease)    occasional Protonix-will take protonix night before surgery   Hypercholesteremia    on meds   Past Surgical History:  Procedure Laterality Date   BLADDER SUSPENSION  12/08/2010   Procedure: TRANSVAGINAL TAPE (TVT) PROCEDURE;  Surgeon: Arloa Koh;  Location: [facility];  Service: Gynecology;  Laterality: N/A;  transvaginal tape with cystoscopy   BLADDER SUSPENSION  01/30/2011   Procedure: TRANSVAGINAL TAPE (TVT) PROCEDURE;  Surgeon: Arloa Koh;  Location: [facility];  Service: Gynecology;  Laterality: N/A;  excision of excess sling from suprapubic incision and cystoscopy   CERVICAL DISC SURGERY  2004   CYSTOSCOPY  01/30/2011   Procedure: CYSTOSCOPY;  Surgeon: Arloa Koh;  Location: [facility];  Service: Gynecology;  Laterality: N/A;   TONSILLECTOMY  1976   transvaginal tape  8/12   uterine ablation     Social History   Social History   Marital status: Married     Spouse name: N/A   Number of children: 2   Occupational History   retired   Social History Main Topics   Smoking status: Former Smoker    Packs/day: 0.75    Years: 30.00    Types: Cigarettes   Smokeless tobacco: Never Used     Comment: recently quit 03/01/2015   Alcohol use No   Drug use: No   Current Outpatient Medications on File Prior to Visit  Medication Sig Dispense Refill   B-D UF III MINI PEN NEEDLES 31G X 5 MM MISC Use 4x a day 200 each 3   Calcium Carbonate-Vitamin D (CALTRATE 600+D PO) Take by mouth.     diazepam (VALIUM) 2 MG tablet Take 2 mg by mouth.     Dulaglutide (TRULICITY) 1.5 EX/5.1ZG SOPN Inject 1.5 weekly into the skin 12 mL 3  fenofibrate (TRICOR) 145 MG tablet TAKE 1 TABLET BY MOUTH EVERY DAY 90 tablet 0   glipiZIDE (GLUCOTROL) 10 MG tablet TAKE 1 TABLET (10 MG TOTAL) BY MOUTH 2 (TWO) TIMES DAILY BEFORE A MEAL. 180 tablet 3   insulin aspart (NOVOLOG FLEXPEN) 100 UNIT/ML FlexPen Inject 8-10 Units into the skin 3 (three) times daily with meals. 30 mL 11   Insulin Glargine (BASAGLAR KWIKPEN) 100 UNIT/ML INJECT 44 UNITS INTO THE SKIN AT BEDTIME 30 mL 2   liraglutide (VICTOZA) 18 MG/3ML SOPN Inject 1.8 mg into the skin daily. (Patient not taking: Reported on 09/26/2020) 9 mL 3   metFORMIN (GLUCOPHAGE) 1000 MG tablet Take 1 tablet (1,000 mg total) by mouth 2 (two) times daily with a meal. 180 tablet 2   Multiple Vitamins-Minerals (MULTIVITAMINS THER. W/MINERALS) TABS Take 1 tablet by mouth daily.     Omega-3 Fatty Acids (FISH OIL) 500 MG CAPS Take 500 mg by mouth daily.     OVER THE COUNTER MEDICATION Take 1 tablet by mouth 2 (two) times daily. PATIENT TAKES CINNAMON TABLETS 1000mg      pantoprazole (PROTONIX) 40 MG tablet TAKE 1 TABLET BY MOUTH EVERY DAY 90 tablet 0   rosuvastatin (CRESTOR) 10 MG tablet TAKE 1 TABLET BY MOUTH EVERY DAY 90 tablet 0   Semaglutide,0.25 or 0.5MG /DOS, (OZEMPIC, 0.25 OR 0.5 MG/DOSE,) 2 MG/1.5ML SOPN Inject 0.5 mg into the skin once a  week. (Patient not taking: No sig reported) 4.5 mL 3   No current facility-administered medications on file prior to visit.   Allergies  Allergen Reactions   Jardiance [Empagliflozin]     Myalgias   Family History  Problem Relation Age of Onset   Hypertension Mother    Hyperlipidemia Mother    Heart disease Mother    Heart disease Father    Hyperlipidemia Father    Diabetes Father    Heart attack Father    Varicose Veins Father    Peripheral vascular disease Father        amputation   Cancer Paternal Aunt        lung/ brain   PE: BP 140/88 (BP Location: Right Arm, Patient Position: Sitting, Cuff Size: Normal)   Pulse 84   Ht 5\' 5"  (1.651 m)   Wt 190 lb 12.8 oz (86.5 kg)   SpO2 96%   BMI 31.75 kg/m  Wt Readings from Last 3 Encounters:  11/10/20 190 lb 12.8 oz (86.5 kg)  09/26/20 192 lb 9.6 oz (87.4 kg)  05/09/20 190 lb (86.2 kg)   Constitutional: overweight, in NAD Eyes: PERRLA, EOMI, no exophthalmos ENT: moist mucous membranes, no thyromegaly, no cervical lymphadenopathy Cardiovascular: RRR, No MRG Respiratory: CTA B Gastrointestinal: abdomen soft, NT, ND, BS+ Musculoskeletal: no deformities, strength intact in all 4 Skin: moist, warm, no rashes Neurological: no tremor with outstretched hands, DTR normal in all 4  ASSESSMENT: 1. DM2, insulin-dependent, uncontrolled, without long term complications, but with hyperglycemia  2. Overweight  3. HL  PLAN:  1. Patient with longstanding, uncontrolled, type 2 diabetes, on a complex medication regimen including metformin, sulfonylurea, basal-bolus insulin regimen, and off-and-on weekly GLP-1 receptor agonist due to price and also side effects (nausea).  At last visit, sugars are much worse and HbA1c increased to 12.1%.  At that time, we increased her metformin dose, increase NovoLog, and restarted Trulicity.  Sugars are very high and were decreasing as the day went by. -At today's visit, he tells me that she was able to  restart Trulicity (  expensive, approximately $500 for 3 months -but she is determined to continue with it).  She initially had nausea, but this resolved and she now experiences decreased appetite and she had slight weight loss.  She is very happy with the impact of Trulicity on her blood sugars.  Reviewing her blood sugar log, most of her blood sugars are 50% lower than before.  This is an excellent improvement.  In fact, she does describe that she occasionally has more tremors and feels that her blood sugars are low but she did not always check her blood sugars at that time.  At this point, we will decrease glipizide to 5 mg before meals and I advised her that in 2 to 3 weeks, she can stop the morning glipizide if sugars remain controlled.  I am hoping that we can stop glipizide completely in the near future and also start reducing NovoLog and Basaglar. -  I suggested to:  Patient Instructions  Please continue: - Metformin 1000 mg 2x a day with meals - Trulicity 1.5 mg weekly - Basaglar 44 units at night - NovoLog 10-15 units before each meal  Please decrease: - Glipizide 5 mg 2x a day before meals - for 2-3 weeks, then stop the am Glipizide   Please return in 2-3 months with your sugar log.   - advised to check sugars at different times of the day - 4x a day, rotating check times - advised for yearly eye exams >> she is UTD - return to clinic in 2-3 months  2. Overweight -continue SGLT 2 inhibitor and GLP-1 receptor agonist which should also help with weight loss -She did not lose weight before last visit, now lost 2 lbs  3. HL -Reviewed latest lipid panel from 12/2019: Fractions at goal with exception of high triglycerides Lab Results  Component Value Date   CHOL 151 12/17/2019   HDL 52.10 12/17/2019   LDLCALC 52 07/02/2019   LDLDIRECT 63.0 12/17/2019   TRIG 304.0 (H) 12/17/2019   CHOLHDL 3 12/17/2019  -On Crestor 10, Tricor 145 and fish oil 500 mg daily, tolerated  well   Philemon Kingdom, MD PhD [facility] Endocrinology

## 2020-12-22 ENCOUNTER — Encounter: Payer: Medicare HMO | Admitting: Internal Medicine

## 2020-12-29 DIAGNOSIS — L820 Inflamed seborrheic keratosis: Secondary | ICD-10-CM | POA: Diagnosis not present

## 2020-12-29 DIAGNOSIS — C44311 Basal cell carcinoma of skin of nose: Secondary | ICD-10-CM | POA: Diagnosis not present

## 2020-12-29 DIAGNOSIS — Z1283 Encounter for screening for malignant neoplasm of skin: Secondary | ICD-10-CM | POA: Diagnosis not present

## 2020-12-29 DIAGNOSIS — D225 Melanocytic nevi of trunk: Secondary | ICD-10-CM | POA: Diagnosis not present

## 2021-01-11 ENCOUNTER — Other Ambulatory Visit: Payer: Self-pay | Admitting: Family

## 2021-01-11 DIAGNOSIS — E785 Hyperlipidemia, unspecified: Secondary | ICD-10-CM

## 2021-01-31 ENCOUNTER — Ambulatory Visit (INDEPENDENT_AMBULATORY_CARE_PROVIDER_SITE_OTHER): Payer: Medicare HMO | Admitting: Internal Medicine

## 2021-01-31 ENCOUNTER — Other Ambulatory Visit: Payer: Self-pay

## 2021-01-31 ENCOUNTER — Encounter: Payer: Self-pay | Admitting: Internal Medicine

## 2021-01-31 VITALS — BP 120/64 | HR 92 | Temp 97.2°F | Resp 17 | Ht 65.0 in | Wt 191.2 lb

## 2021-01-31 DIAGNOSIS — F5104 Psychophysiologic insomnia: Secondary | ICD-10-CM

## 2021-01-31 DIAGNOSIS — Z794 Long term (current) use of insulin: Secondary | ICD-10-CM

## 2021-01-31 DIAGNOSIS — Z780 Asymptomatic menopausal state: Secondary | ICD-10-CM

## 2021-01-31 DIAGNOSIS — H8113 Benign paroxysmal vertigo, bilateral: Secondary | ICD-10-CM

## 2021-01-31 DIAGNOSIS — E785 Hyperlipidemia, unspecified: Secondary | ICD-10-CM | POA: Diagnosis not present

## 2021-01-31 DIAGNOSIS — E6609 Other obesity due to excess calories: Secondary | ICD-10-CM | POA: Diagnosis not present

## 2021-01-31 DIAGNOSIS — E1165 Type 2 diabetes mellitus with hyperglycemia: Secondary | ICD-10-CM

## 2021-01-31 DIAGNOSIS — E66811 Obesity, class 1: Secondary | ICD-10-CM

## 2021-01-31 DIAGNOSIS — K219 Gastro-esophageal reflux disease without esophagitis: Secondary | ICD-10-CM

## 2021-01-31 DIAGNOSIS — Z6829 Body mass index (BMI) 29.0-29.9, adult: Secondary | ICD-10-CM | POA: Insufficient documentation

## 2021-01-31 DIAGNOSIS — Z0001 Encounter for general adult medical examination with abnormal findings: Secondary | ICD-10-CM | POA: Diagnosis not present

## 2021-01-31 DIAGNOSIS — M1990 Unspecified osteoarthritis, unspecified site: Secondary | ICD-10-CM | POA: Diagnosis not present

## 2021-01-31 DIAGNOSIS — R69 Illness, unspecified: Secondary | ICD-10-CM | POA: Diagnosis not present

## 2021-01-31 DIAGNOSIS — Z23 Encounter for immunization: Secondary | ICD-10-CM | POA: Diagnosis not present

## 2021-01-31 DIAGNOSIS — E782 Mixed hyperlipidemia: Secondary | ICD-10-CM | POA: Diagnosis not present

## 2021-01-31 DIAGNOSIS — Z6831 Body mass index (BMI) 31.0-31.9, adult: Secondary | ICD-10-CM

## 2021-01-31 MED ORDER — FENOFIBRATE 145 MG PO TABS: 145.0000 mg | ORAL_TABLET | Freq: Every day | ORAL | 1 refills | Status: DC

## 2021-01-31 MED ORDER — PANTOPRAZOLE SODIUM 40 MG PO TBEC: 40.0000 mg | DELAYED_RELEASE_TABLET | Freq: Two times a day (BID) | ORAL | 1 refills | Status: DC

## 2021-01-31 NOTE — Patient Instructions (Signed)

## 2021-01-31 NOTE — Assessment & Plan Note (Signed)
Encourage diet and exercise for weight loss 

## 2021-01-31 NOTE — Assessment & Plan Note (Signed)
Continue Melatonin as needed

## 2021-01-31 NOTE — Progress Notes (Signed)
Subjective:    Patient ID: [name], female    DOB: Apr 07, 1956, 65 y.o.   MRN: [id number]  HPI  Pt presents to the clinic today for her annual exam.  She is also due to follow-up chronic conditions.  BPPV: Intermittent, managed with Diazepam as needed.  She follows with ENT.  OA: Mainly in her hands and shoulders.  She does not take any medications for this.  She does not follow with orthopedics.  GERD: Triggered by spicy foods.  She is having breakthrough on Pantoprazole. She takes [employer] as needed with good relief. There is no upper GI on file.  HLD: Her last LDL was 64, triglycerides 304, 12/2019.  She is taking Rosuvastatin but not Fenofibrate and Fish Oil as prescribed.  She does not consume a low-fat diet.  DM2: Her last A1c was 12.1, 09/2020.  She is taking Glipizide, Metformin, Trulicity and Tresbia as prescribed.  Her sugars range 120-200.  She checks her feet routinely.  Her last eye exam was 10/2020, [facility].  She follows with endocrinology.  Insomnia: She has difficulty staying asleep.  She is taking Melatonin as needed with some results.  There is no sleep study on file.  Flu: 01/2020 Tetanus: 11/2011 Pneumovax: 07/2016 COVID: Never Zostavax: 07/2012 Shingrix: Never Pap smear: 10/2018 Mammogram: 09/2020, [facility] Bone density: never Colon screening: 05/2013 Vision screening: Annually Dentist: Every 3 months  Diet: She does eat meat. She consumes more veggies than fruits. She does eat some fried foods. She drinks mostly water. Exercise: Yardwork  Review of Systems  Past Medical History:  Diagnosis Date   Blood transfusion    1984   [facility]   Chronic kidney disease    occasional renal stone   Complication of anesthesia    woke during surgery (ablation) 2006?   Diabetes mellitus    type 2 Diab- A1C-7   GERD (gastroesophageal reflux disease)    occasional Protonix-will take protonix night before surgery   Hypercholesteremia    on meds     Current Outpatient Medications  Medication Sig Dispense Refill   B-D UF III MINI PEN NEEDLES 31G X 5 MM MISC Use 4x a day 200 each 3   Calcium Carbonate-Vitamin D (CALTRATE 600+D PO) Take by mouth.     diazepam (VALIUM) 2 MG tablet Take 2 mg by mouth.     Dulaglutide (TRULICITY) 1.5 FE/7.6DY SOPN Inject 1.5 weekly into the skin 12 mL 3   fenofibrate (TRICOR) 145 MG tablet TAKE 1 TABLET BY MOUTH EVERY DAY 90 tablet 0   glipiZIDE (GLUCOTROL) 10 MG tablet Take 0.5 tablets (5 mg total) by mouth 2 (two) times daily before a meal. 90 tablet 3   insulin aspart (NOVOLOG FLEXPEN) 100 UNIT/ML FlexPen Inject 8-10 Units into the skin 3 (three) times daily with meals. 30 mL 11   Insulin Glargine (BASAGLAR KWIKPEN) 100 UNIT/ML INJECT 44 UNITS INTO THE SKIN AT BEDTIME 30 mL 2   metFORMIN (GLUCOPHAGE) 1000 MG tablet Take 1 tablet (1,000 mg total) by mouth 2 (two) times daily with a meal. 180 tablet 2   Multiple Vitamins-Minerals (MULTIVITAMINS THER. W/MINERALS) TABS Take 1 tablet by mouth daily.     Omega-3 Fatty Acids (FISH OIL) 500 MG CAPS Take 500 mg by mouth daily.     OVER THE COUNTER MEDICATION Take 1 tablet by mouth 2 (two) times daily. PATIENT TAKES CINNAMON TABLETS 1017m     pantoprazole (PROTONIX) 40 MG tablet TAKE 1 TABLET BY MOUTH  EVERY DAY 90 tablet 0   rosuvastatin (CRESTOR) 10 MG tablet TAKE 1 TABLET BY MOUTH EVERY DAY 90 tablet 0   No current facility-administered medications for this visit.    Allergies  Allergen Reactions   Jardiance [Empagliflozin]     Myalgias    Family History  Problem Relation Age of Onset   Hypertension Mother    Hyperlipidemia Mother    Heart disease Mother    Heart disease Father    Hyperlipidemia Father    Diabetes Father    Heart attack Father    Varicose Veins Father    Peripheral vascular disease Father        amputation   Cancer Paternal Aunt        lung/ brain    Social History   Socioeconomic History   Marital status: Married     Spouse name: Not on file   Number of children: Not on file   Years of education: Not on file   Highest education level: Not on file  Occupational History   Not on file  Tobacco Use   Smoking status: Former    Packs/day: 0.75    Years: 30.00    Pack years: 22.50    Types: Cigarettes   Smokeless tobacco: Never   Tobacco comments:    recently quit 03/01/2015  Substance and Sexual Activity   Alcohol use: No    Alcohol/week: 0.0 standard drinks   Drug use: No   Sexual activity: Yes  Other Topics Concern   Not on file  Social History Narrative   Not on file   Social Determinants of Health   Financial Resource Strain: Not on file  Food Insecurity: Not on file  Transportation Needs: Not on file  Physical Activity: Not on file  Stress: Not on file  Social Connections: Not on file  Intimate Partner Violence: Not on file     Constitutional: Denies fever, malaise, fatigue, headache or abrupt weight changes.  HEENT: Denies eye pain, eye redness, ear pain, ringing in the ears, wax buildup, runny nose, nasal congestion, bloody nose, or sore throat. Respiratory: Denies difficulty breathing, shortness of breath, cough or sputum production.   Cardiovascular: Denies chest pain, chest tightness, palpitations or swelling in the hands or feet.  Gastrointestinal: Pt reports reflux. Denies abdominal pain, bloating, constipation, diarrhea or blood in the stool.  GU: Denies urgency, frequency, pain with urination, burning sensation, blood in urine, odor or discharge. Musculoskeletal: Pt reports intermittent joint pain. Denies decrease in range of motion, difficulty with gait, muscle pain or joint swelling.  Skin: Denies redness, rashes, lesions or ulcercations.  Neurological: Pt reports insomnia, intermittent dizziness. Denies difficulty with memory, difficulty with speech or problems with balance and coordination.  Psych: Denies anxiety, depression, SI/HI.  No other specific complaints in a  complete review of systems (except as listed in HPI above).     Objective:   Physical Exam  BP 120/64 (BP Location: Right Arm, Patient Position: Sitting, Cuff Size: Normal)   Pulse 92   Temp (!) 97.2 F (36.2 C) (Temporal)   Resp 17   Ht '5\' 5"'  (1.651 m)   Wt 191 lb 3.2 oz (86.7 kg)   SpO2 98%   BMI 31.82 kg/m   Wt Readings from Last 3 Encounters:  11/10/20 190 lb 12.8 oz (86.5 kg)  09/26/20 192 lb 9.6 oz (87.4 kg)  05/09/20 190 lb (86.2 kg)    General: Appears her stated age, obese, in NAD. Skin: Warm,  dry and intact. No ulcerations noted. HEENT: Head: normal shape and size; Eyes: sclera white and EOMs intact;  Neck:  Neck supple, trachea midline. No masses, lumps or thyromegaly present.  Cardiovascular: Normal rate and rhythm. S1,S2 noted.  No murmur, rubs or gallops noted. No JVD or BLE edema. No carotid bruits noted. Pulmonary/Chest: Normal effort and positive vesicular breath sounds. No respiratory distress. No wheezes, rales or ronchi noted.  Abdomen: Soft and nontender. Normal bowel sounds. No distention or masses noted. Liver, spleen and kidneys non palpable. Musculoskeletal: Strength 5/5 BUE/BLE.  No difficulty with gait.  Neurological: Alert and oriented. Cranial nerves II-XII grossly intact. Coordination normal.  Psychiatric: Mood and affect normal. Behavior is normal. Judgment and thought content normal.     BMET    Component Value Date/Time   NA 137 12/17/2019 0901   K 4.2 12/17/2019 0901   CL 102 12/17/2019 0901   CO2 28 12/17/2019 0901   GLUCOSE 151 (H) 12/17/2019 0901   BUN 17 12/17/2019 0901   CREATININE 0.88 12/17/2019 0901   CREATININE 0.85 09/27/2016 0854   CALCIUM 9.6 12/17/2019 0901   GFRNONAA 76 09/27/2016 0854   GFRAA 87 09/27/2016 0854    Lipid Panel     Component Value Date/Time   CHOL 151 12/17/2019 0901   TRIG 304.0 (H) 12/17/2019 0901   HDL 52.10 12/17/2019 0901   CHOLHDL 3 12/17/2019 0901   VLDL 60.8 (H) 12/17/2019 0901    LDLCALC 52 07/02/2019 0741    CBC    Component Value Date/Time   WBC 8.1 12/17/2019 0901   RBC 4.41 12/17/2019 0901   HGB 12.8 12/17/2019 0901   HCT 38.3 12/17/2019 0901   PLT 293.0 12/17/2019 0901   MCV 86.7 12/17/2019 0901   MCH 29.2 01/07/2014 1402   MCHC 33.5 12/17/2019 0901   RDW 13.5 12/17/2019 0901   LYMPHSABS 3.1 01/07/2014 1402   MONOABS 0.6 01/07/2014 1402   EOSABS 0.1 01/07/2014 1402   BASOSABS 0.0 01/07/2014 1402    Hgb A1C Lab Results  Component Value Date   HGBA1C 12.1 (A) 09/26/2020          Assessment & Plan:   Preventative Health Maintenance:  Flu shot today Tetanus UTD Pneumovax UTD Encouraged her to get a COVID-vaccine Pap smear UTD Mammogram UTD, will request copy Bone density ordered, she will schedule this with her upcoming mammogram Colon screening UTD Encouraged her to consume a balanced diet and exercise regimen Advised her to see an eye doctor and dentist annually We will check CBC, c-Met, lipid, A1c and urine microalbumin today  RTC in 6 months, follow up chronic conditions Webb Silversmith, NP This visit occurred during the SARS-CoV-2 public health emergency.  Safety protocols were in place, including screening questions prior to the visit, additional usage of staff PPE, and extensive cleaning of exam room while observing appropriate contact time as indicated for disinfecting solutions.

## 2021-01-31 NOTE — Assessment & Plan Note (Signed)
Uncontrolled A1c and urine microalbumin today Continue glipizide, metformin, Trulicity and Tresbia Encouraged her to consume a low-carb diet and exercise weight loss Will request a copy of her eye exam Encourage routine foot exam Flu shot today Pneumovax UTD Encouraged her to get a COVID-vaccine

## 2021-01-31 NOTE — Assessment & Plan Note (Signed)
C-Met and lipid profile today Continue Rosuvastatin Refilled Fenofibrate, continue Fish Oil Reinforced low-fat diet

## 2021-01-31 NOTE — Assessment & Plan Note (Signed)
Encourage regular stretching and exercise for weight loss as this can help reduce joint pain Okay to take Tylenol OTC as needed

## 2021-01-31 NOTE — Assessment & Plan Note (Signed)
Deteriorated Advised her to avoid foods that trigger her reflux Encourage weight loss as this can help reduce reflux symptoms Will increase Pantoprazole to 2 times daily CBC and c-Met today

## 2021-01-31 NOTE — Assessment & Plan Note (Signed)
Continue Diazepam as needed She will continue to see ENT, will follow

## 2021-02-01 LAB — HEMOGLOBIN A1C
Hgb A1c MFr Bld: 8.4 % of total Hgb — ABNORMAL HIGH (ref <5.7)
Mean Plasma Glucose: 194 mg/dL
eAG (mmol/L): 10.8 mmol/L

## 2021-02-01 LAB — COMPLETE METABOLIC PANEL WITH GFR
AG Ratio: 1.8 (calc) (ref 1.0–2.5)
ALT: 30 U/L — ABNORMAL HIGH (ref 6–29)
AST: 19 U/L (ref 10–35)
Albumin: 4.3 g/dL (ref 3.6–5.1)
Alkaline phosphatase (APISO): 96 U/L (ref 37–153)
BUN: 14 mg/dL (ref 7–25)
CO2: 26 mmol/L (ref 20–32)
Calcium: 9.4 mg/dL (ref 8.6–10.4)
Chloride: 103 mmol/L (ref 98–110)
Creat: 0.73 mg/dL (ref 0.50–1.05)
Globulin: 2.4 g/dL (calc) (ref 1.9–3.7)
Glucose, Bld: 210 mg/dL — ABNORMAL HIGH (ref 65–99)
Potassium: 4.7 mmol/L (ref 3.5–5.3)
Sodium: 138 mmol/L (ref 135–146)
Total Bilirubin: 0.4 mg/dL (ref 0.2–1.2)
Total Protein: 6.7 g/dL (ref 6.1–8.1)
eGFR: 93 mL/min/{1.73_m2} (ref >=60)

## 2021-02-01 LAB — MICROALBUMIN / CREATININE URINE RATIO
Creatinine, Urine: 110 mg/dL (ref 20–275)
Microalb Creat Ratio: 11 mcg/mg creat (ref <30)
Microalb, Ur: 1.2 mg/dL

## 2021-02-01 LAB — CBC
HCT: 39.2 % (ref 35.0–45.0)
Hemoglobin: 12.6 g/dL (ref 11.7–15.5)
MCH: 29.2 pg (ref 27.0–33.0)
MCHC: 32.1 g/dL (ref 32.0–36.0)
MCV: 90.7 fL (ref 80.0–100.0)
MPV: 11.8 fL (ref 7.5–12.5)
Platelets: 249 10*3/uL (ref 140–400)
RBC: 4.32 10*6/uL (ref 3.80–5.10)
RDW: 12.4 % (ref 11.0–15.0)
WBC: 7 10*3/uL (ref 3.8–10.8)

## 2021-02-01 LAB — LIPID PANEL
Cholesterol: 163 mg/dL (ref <200)
HDL: 57 mg/dL (ref >=50)
Non-HDL Cholesterol (Calc): 106 mg/dL (calc) (ref <130)
Total CHOL/HDL Ratio: 2.9 (calc) (ref <5.0)
Triglycerides: 535 mg/dL — ABNORMAL HIGH (ref <150)

## 2021-02-20 ENCOUNTER — Other Ambulatory Visit: Payer: Self-pay | Admitting: Internal Medicine

## 2021-02-20 DIAGNOSIS — E785 Hyperlipidemia, unspecified: Secondary | ICD-10-CM

## 2021-02-21 DIAGNOSIS — Z974 Presence of external hearing-aid: Secondary | ICD-10-CM | POA: Diagnosis not present

## 2021-02-21 DIAGNOSIS — R42 Dizziness and giddiness: Secondary | ICD-10-CM | POA: Diagnosis not present

## 2021-02-21 DIAGNOSIS — Z888 Allergy status to other drugs, medicaments and biological substances status: Secondary | ICD-10-CM | POA: Diagnosis not present

## 2021-02-21 DIAGNOSIS — H9041 Sensorineural hearing loss, unilateral, right ear, with unrestricted hearing on the contralateral side: Secondary | ICD-10-CM | POA: Diagnosis not present

## 2021-02-21 DIAGNOSIS — H903 Sensorineural hearing loss, bilateral: Secondary | ICD-10-CM | POA: Diagnosis not present

## 2021-02-21 DIAGNOSIS — H8190 Unspecified disorder of vestibular function, unspecified ear: Secondary | ICD-10-CM | POA: Diagnosis not present

## 2021-02-22 ENCOUNTER — Ambulatory Visit: Payer: Medicare HMO | Admitting: Internal Medicine

## 2021-02-22 NOTE — Progress Notes (Deleted)
Patient ID: [name], female   DOB: 04-Mar-1956, 65 y.o.   MRN: [id number]   This visit occurred during the SARS-CoV-2 public health emergency.  Safety protocols were in place, including screening questions prior to the visit, additional usage of staff PPE, and extensive cleaning of exam room while observing appropriate contact time as indicated for disinfecting solutions.   HPI: [name] is a 65 y.o.-year-old female, returning for f/u for DM2, dx in 2013, insulin-dependent since 2016, uncontrolled, without long term complications.   Last visit 3 months ago.  Interim history: No increased urination, blurry vision, nausea, chest pain.  She continues to have decreased appetite on Trulicity.  Reviewed HbA1c levels: Lab Results  Component Value Date   HGBA1C 8.4 (H) 01/31/2021   HGBA1C 12.1 (A) 09/26/2020   HGBA1C 11.4 (A) 05/09/2020   HGBA1C 10.1 (A) 02/04/2020   HGBA1C 6.9 (A) 09/29/2019   HGBA1C 8.6 (H) 07/02/2019   HGBA1C 8.3 (A) 05/29/2019   HGBA1C 8.7 (H) 12/15/2018   HGBA1C 9.6 (A) 10/16/2018   HGBA1C 11.2 (A) 07/18/2018   HGBA1C 8.6 (H) 12/09/2017   HGBA1C 8.2 06/25/2017   HGBA1C 7.8 01/16/2017   HGBA1C 8.9 09/27/2016   HGBA1C 9.8 (H) 07/05/2016   HGBA1C 10.4 (H) 03/28/2016   HGBA1C 8.4 (H) 12/27/2015   HGBA1C 9.0 (H) 06/29/2015   HGBA1C 10.0 (H) 03/24/2015   HGBA1C 8.8 (H) 04/19/2014   HGBA1C 11.6 (H) 01/07/2014   HGBA1C 12.2 (H) 10/06/2013   HGBA1C 9.4 (H) 04/27/2013   Pt is on a regimen of: - Metformin  1000 >> 500 mg >> 1000 mg 2x a day, with meals - Glipizide 10 mg 20-30 minutes before breakfast and dinner-restarted 10/2018 >> 5 mg before dinner - Trulicity 1.5 mg weekly -restarted 09/2020 - decreased appetite, no more nausea - >> Basaglar 44 units daily - NovoLog 8 units before B and 10 units before D >> 10-15 units before meals She had mm cramps from Invokana. We tried to start Farxiga 10 mg before b'fast >> did not get it from pharmacy. She was previously  on Trulicity, but stopped due to cost and also nausea.  We tried Ozempic but she again had problems with constant nausea.  In 09/2020, I again suggested to try a low-dose Trulicity -she was able to continue on this.   Pt checks her sugars 2-4 times a day: - am: 126-143 on Ozempic; 150-330  >> 175-220 >> 90x, 112-131 - 2h after b'fast: 138-165 >> 200s >> n/c >> 139-147 - before lunch: 153-182 on Ozempic; 150-250 >> 200s  >> n/c - 2h after lunch: <150 >> n/c >> 150-170 >> n/c - before dinner:  164-212 on Ozempic; 150-250 >> 215-350 >> n/c - 2h after dinner: 153-190 >> 200-280 >> 197 on Ozempic >> n/c - bedtime: n/c >> 179  >> n/c >> 161-181 - nighttime: n/c >> 212 Lowest sugar was 126 >> 175 >> 90s; it is unclear at which ever she has hypoglycemia awareness. Highest sugar was 330 >> 345 >> 212.  Glucometer: OneTouch Ultra >> One Touch Verio Flex  Pt's meals are: - Breakfast: grits, meat or cereals - Lunch: sandwich or bowl of cereal - Dinner: 2-3x a week eats out: chicken, steak, ribs - Snacks: , fruit -watermelon No sodas.  She drinks water with Crystal light  -No CKD, last BUN/creatinine:  Lab Results  Component Value Date   BUN 14 01/31/2021   BUN 17 12/17/2019   CREATININE 0.73 01/31/2021  CREATININE 0.88 12/17/2019   -+ HL; last set of lipids: Lab Results  Component Value Date   CHOL 163 01/31/2021   HDL 57 01/31/2021   LDLCALC  01/31/2021     Comment:     . LDL cholesterol not calculated. Triglyceride levels greater than 400 mg/dL invalidate calculated LDL results. . Reference range: <100 . Desirable range <100 mg/dL for primary prevention;   <70 mg/dL for patients with CHD or diabetic patients  with > or = 2 CHD risk factors. Marland Kitchen LDL-C is now calculated using the Martin-Hopkins  calculation, which is a validated novel method providing  better accuracy than the Friedewald equation in the  estimation of LDL-C.  Cresenciano Genre et al. Annamaria Helling. 9390;300(92): 2061-2068   (http://education.[facility].com/faq/FAQ164)    LDLDIRECT 63.0 12/17/2019   TRIG 535 (H) 01/31/2021   CHOLHDL 2.9 01/31/2021  On Crestor, fenofibrate, fish oil  - last eye exam was 02/24/2020: No DR reportedly.  - no numbness and tingling in her feet.  She had vertigo >> seeing specialist at [facility] >> changed from meclizine to Valium.  She has a microphone in the right ear which transmits to the left.   Her husband, [name], was also my patient - he had terminal liver cancer-died July 10, 2019.  ROS:  + See HPI Neurological: + tremors in thumbs/no numbness/no tingling/no dizziness  I reviewed pt's medications, allergies, PMH, social hx, family hx, and changes were documented in the history of present illness. Otherwise, unchanged from my initial visit note.  Past Medical History:  Diagnosis Date   Blood transfusion    1984   [facility]   Chronic kidney disease    occasional renal stone   Complication of anesthesia    woke during surgery (ablation) 2006?   Diabetes mellitus    type 2 Diab- A1C-7   GERD (gastroesophageal reflux disease)    occasional Protonix-will take protonix night before surgery   Hypercholesteremia    on meds   Past Surgical History:  Procedure Laterality Date   BLADDER SUSPENSION  12/08/2010   Procedure: TRANSVAGINAL TAPE (TVT) PROCEDURE;  Surgeon: Arloa Koh;  Location: [place] [facility];  Service: Gynecology;  Laterality: N/A;  transvaginal tape with cystoscopy   BLADDER SUSPENSION  01/30/2011   Procedure: TRANSVAGINAL TAPE (TVT) PROCEDURE;  Surgeon: Arloa Koh;  Location: [place] [facility];  Service: Gynecology;  Laterality: N/A;  excision of excess sling from suprapubic incision and cystoscopy   CERVICAL DISC SURGERY  2004   CYSTOSCOPY  01/30/2011   Procedure: CYSTOSCOPY;  Surgeon: Arloa Koh;  Location: [place] [facility];  Service: Gynecology;  Laterality: N/A;   TONSILLECTOMY  1976   transvaginal tape  8/12   uterine ablation     Social History   Social History    Marital status: Married    Spouse name: N/A   Number of children: 2   Occupational History   retired   Social History Main Topics   Smoking status: Former Smoker    Packs/day: 0.75    Years: 30.00    Types: Cigarettes   Smokeless tobacco: Never Used     Comment: recently quit 03/01/2015   Alcohol use No   Drug use: No   Current Outpatient Medications on File Prior to Visit  Medication Sig Dispense Refill   B-D UF III MINI PEN NEEDLES 31G X 5 MM MISC Use 4x a day 200 each 3   Calcium Carbonate-Vitamin D (CALTRATE 600+D PO) Take by mouth.     Dulaglutide (TRULICITY)  1.5 MG/0.5ML SOPN Inject 1.5 weekly into the skin 12 mL 3   fenofibrate (TRICOR) 145 MG tablet Take 1 tablet (145 mg total) by mouth daily. 90 tablet 1   glipiZIDE (GLUCOTROL) 10 MG tablet Take 0.5 tablets (5 mg total) by mouth 2 (two) times daily before a meal. (Patient taking differently: Take 10 mg by mouth 2 (two) times daily before a meal.) 90 tablet 3   Insulin Glargine (BASAGLAR KWIKPEN) 100 UNIT/ML INJECT 44 UNITS INTO THE SKIN AT BEDTIME 30 mL 2   metFORMIN (GLUCOPHAGE) 1000 MG tablet Take 1 tablet (1,000 mg total) by mouth 2 (two) times daily with a meal. 180 tablet 2   Multiple Vitamins-Minerals (MULTIVITAMINS THER. W/MINERALS) TABS Take 1 tablet by mouth daily.     Omega-3 Fatty Acids (FISH OIL) 500 MG CAPS Take 500 mg by mouth daily.     OVER THE COUNTER MEDICATION Take 1 tablet by mouth 2 (two) times daily. PATIENT TAKES CINNAMON TABLETS 1000mg      pantoprazole (PROTONIX) 40 MG tablet Take 1 tablet (40 mg total) by mouth 2 (two) times daily. 180 tablet 1   rosuvastatin (CRESTOR) 10 MG tablet TAKE 1 TABLET BY MOUTH EVERY DAY 90 tablet 0   No current facility-administered medications on file prior to visit.   Allergies  Allergen Reactions   Jardiance [Empagliflozin]     Myalgias   Family History  Problem Relation Age of Onset   Hypertension Mother    Hyperlipidemia Mother    Heart disease Mother     Heart disease Father    Hyperlipidemia Father    Diabetes Father    Heart attack Father    Varicose Veins Father    Peripheral vascular disease Father        amputation   Cancer Paternal Aunt        lung/ brain   PE: There were no vitals taken for this visit. Wt Readings from Last 3 Encounters:  01/31/21 191 lb 3.2 oz (86.7 kg)  11/10/20 190 lb 12.8 oz (86.5 kg)  09/26/20 192 lb 9.6 oz (87.4 kg)   Constitutional: overweight, in NAD Eyes: PERRLA, EOMI, no exophthalmos ENT: moist mucous membranes, no thyromegaly, no cervical lymphadenopathy Cardiovascular: RRR, No MRG Respiratory: CTA B Gastrointestinal: abdomen soft, NT, ND, BS+ Musculoskeletal: no deformities, strength intact in all 4 Skin: moist, warm, no rashes Neurological: no tremor with outstretched hands, DTR normal in all 4  ASSESSMENT: 1. DM2, insulin-dependent, uncontrolled, without long term complications, but with hyperglycemia  2. Overweight  3. HL  PLAN:  1. Patient with longstanding, uncontrolled, type 2 diabetes, on a complex medication regimen including metformin, sulfonylurea, basal-bolus insulin regimen and weekly GLP-1 receptor agonist.  Unfortunately, in the past, she was off and on Trulicity due to price and also side effects (nausea).  However, she now continues it despite costing her approximately $500 for 3 months, due to the the significant effect on her blood sugars.  At last visit, sugars were 50% improved from before, so we discussed about stopping the morning glipizide.  Most recent HbA1c from last month decreased spectacularly from 12.1% to 8.4%!  -  I suggested to:  Patient Instructions  Please continue: - Metformin 1000 mg 2x a day with meals - Glipizide 5 mg before dinner - Trulicity 1.5 mg weekly - Basaglar 44 units at night - NovoLog 10-15 units before each meal  Please return in 3 months with your sugar log.   - advised to check sugars at  different times of the day - 3-4x a day,  rotating check times - advised for yearly eye exams >> she is UTD - return to clinic in 3 months  2. Overweight -continue SGLT 2 inhibitor and GLP-1 receptor agonist which should also help with weight loss -Weight was approximately stable at last visit  3. HL -Reviewed latest lipid panel from 01/2021: Fractions at goal but still very high triglycerides: Lab Results  Component Value Date   CHOL 163 01/31/2021   HDL 57 01/31/2021   LDLCALC  01/31/2021     Comment:     . LDL cholesterol not calculated. Triglyceride levels greater than 400 mg/dL invalidate calculated LDL results. . Reference range: <100 . Desirable range <100 mg/dL for primary prevention;   <70 mg/dL for patients with CHD or diabetic patients  with > or = 2 CHD risk factors. Marland Kitchen LDL-C is now calculated using the Martin-Hopkins  calculation, which is a validated novel method providing  better accuracy than the Friedewald equation in the  estimation of LDL-C.  Cresenciano Genre et al. Annamaria Helling. 0315;945(85): 2061-2068  (http://education.[facility].com/faq/FAQ164)    LDLDIRECT 63.0 12/17/2019   TRIG 535 (H) 01/31/2021   CHOLHDL 2.9 01/31/2021  -On Crestor 10, Tricor 145 and fish oil 500 mg daily, all tolerated well   Philemon Kingdom, MD PhD [facility]

## 2021-02-24 DIAGNOSIS — C4491 Basal cell carcinoma of skin, unspecified: Secondary | ICD-10-CM | POA: Diagnosis not present

## 2021-02-24 DIAGNOSIS — C44311 Basal cell carcinoma of skin of nose: Secondary | ICD-10-CM | POA: Diagnosis not present

## 2021-02-24 DIAGNOSIS — Z481 Encounter for planned postprocedural wound closure: Secondary | ICD-10-CM | POA: Diagnosis not present

## 2021-02-24 NOTE — Telephone Encounter (Signed)
Is patient with you at your new office? Looks like you have seen 01/11/2021

## 2021-02-28 DIAGNOSIS — E2839 Other primary ovarian failure: Secondary | ICD-10-CM | POA: Insufficient documentation

## 2021-02-28 DIAGNOSIS — Z01419 Encounter for gynecological examination (general) (routine) without abnormal findings: Secondary | ICD-10-CM | POA: Diagnosis not present

## 2021-03-03 ENCOUNTER — Encounter: Payer: Self-pay | Admitting: Internal Medicine

## 2021-03-08 ENCOUNTER — Other Ambulatory Visit: Payer: Self-pay | Admitting: Obstetrics and Gynecology

## 2021-03-08 DIAGNOSIS — E2839 Other primary ovarian failure: Secondary | ICD-10-CM

## 2021-04-03 ENCOUNTER — Encounter: Payer: Self-pay | Admitting: Internal Medicine

## 2021-04-03 ENCOUNTER — Other Ambulatory Visit: Payer: Self-pay

## 2021-04-03 ENCOUNTER — Ambulatory Visit: Payer: Self-pay

## 2021-04-03 ENCOUNTER — Ambulatory Visit (INDEPENDENT_AMBULATORY_CARE_PROVIDER_SITE_OTHER): Payer: Medicare HMO | Admitting: Internal Medicine

## 2021-04-03 VITALS — BP 134/55 | HR 102 | Temp 97.9°F | Resp 18 | Ht 65.0 in | Wt 184.2 lb

## 2021-04-03 DIAGNOSIS — J0100 Acute maxillary sinusitis, unspecified: Secondary | ICD-10-CM

## 2021-04-03 MED ORDER — AMOXICILLIN-POT CLAVULANATE 875-125 MG PO TABS: 1.0000 | ORAL_TABLET | Freq: Two times a day (BID) | ORAL | 0 refills | Status: DC

## 2021-04-03 NOTE — Telephone Encounter (Signed)
Patient called in to say that for the past 2 weeks she have been coughing and used up two boxes of Mucinex but still feel horrid cough, sinus congestion and sinus drainage. Please call patient Ph# [phone]  Pt. Started coughing 2 weeks ago. Clear mucus when she coughs anything up. Has bloody drainage from sinus. No fever. COVID 19 test negative today. Appointment made.    Reason for Disposition  SEVERE coughing spells (e.g., whooping sound after coughing, vomiting after coughing)  Answer Assessment - Initial Assessment Questions 1. ONSET: "When did the cough begin?"       2 weeks ago 2. SEVERITY: "How bad is the cough today?"      Severe 3. SPUTUM: "Describe the color of your sputum" (none, dry cough; clear, white, yellow, green)     Clear 4. HEMOPTYSIS: "Are you coughing up any blood?" If so ask: "How much?" (flecks, streaks, tablespoons, etc.)     Bloody nose 5. DIFFICULTY BREATHING: "Are you having difficulty breathing?" If Yes, ask: "How bad is it?" (e.g., mild, moderate, severe)    - MILD: No SOB at rest, mild SOB with walking, speaks normally in sentences, can lie down, no retractions, pulse < 100.    - MODERATE: SOB at rest, SOB with minimal exertion and prefers to sit, cannot lie down flat, speaks in phrases, mild retractions, audible wheezing, pulse 100-120.    - SEVERE: Very SOB at rest, speaks in single words, struggling to breathe, sitting hunched forward, retractions, pulse > 120      No 6. FEVER: "Do you have a fever?" If Yes, ask: "What is your temperature, how was it measured, and when did it start?"     No 7. CARDIAC HISTORY: "Do you have any history of heart disease?" (e.g., heart attack, congestive heart failure)      No 8. LUNG HISTORY: "Do you have any history of lung disease?"  (e.g., pulmonary embolus, asthma, emphysema)     No 9. PE RISK FACTORS: "Do you have a history of blood clots?" (or: recent major surgery, recent prolonged travel, bedridden)     No 10.  OTHER SYMPTOMS: "Do you have any other symptoms?" (e.g., runny nose, wheezing, chest pain)       Runny nose 11. PREGNANCY: "Is there any chance you are pregnant?" "When was your last menstrual period?"       No 12. TRAVEL: "Have you traveled out of the country in the last month?" (e.g., travel history, exposures)       No  Protocols used: Cough - Acute Productive-A-AH

## 2021-04-03 NOTE — Progress Notes (Signed)
HPI  Patient presents to clinic today with complaint of facial pain and pressure and congestion and cough.  She reports this started 2 weeks ago.  The headache is located in her forehead.  She describes she is blowing blood-tinged clear mucus out of her nose. The cough is productive of yellow mucus at times.  She denies runny nose, ear pain or diarrhea.  She denies fever, chills or body aches she has had sick contacts with similar symptoms.  She has had a negative COVID test.  She is prone to sinus infections and reports this feels the same.  She no longer smokes  Review of Systems     Past Medical History:  Diagnosis Date   Blood transfusion    1984   [facility]   Chronic kidney disease    occasional renal stone   Complication of anesthesia    woke during surgery (ablation) 2006?   Diabetes mellitus    type 2 Diab- A1C-7   GERD (gastroesophageal reflux disease)    occasional Protonix-will take protonix night before surgery   Hypercholesteremia    on meds    Family History  Problem Relation Age of Onset   Hypertension Mother    Hyperlipidemia Mother    Heart disease Mother    Heart disease Father    Hyperlipidemia Father    Diabetes Father    Heart attack Father    Varicose Veins Father    Peripheral vascular disease Father        amputation   Cancer Paternal Aunt        lung/ brain    Social History   Socioeconomic History   Marital status: Married    Spouse name: Not on file   Number of children: Not on file   Years of education: Not on file   Highest education level: Not on file  Occupational History   Not on file  Tobacco Use   Smoking status: Former    Packs/day: 0.75    Years: 30.00    Pack years: 22.50    Types: Cigarettes   Smokeless tobacco: Never   Tobacco comments:    recently quit 03/01/2015  Vaping Use   Vaping Use: Never used  Substance and Sexual Activity   Alcohol use: No    Alcohol/week: 0.0 standard drinks   Drug use: No   Sexual activity:  Yes  Other Topics Concern   Not on file  Social History Narrative   Not on file   Social Determinants of Health   Financial Resource Strain: Not on file  Food Insecurity: Not on file  Transportation Needs: Not on file  Physical Activity: Not on file  Stress: Not on file  Social Connections: Not on file  Intimate Partner Violence: Not on file    Allergies  Allergen Reactions   Jardiance [Empagliflozin]     Myalgias     Constitutional: Positive headache, fatigue.  Denies fever abrupt weight changes.  HEENT:  Positive facial pain, nasal congestion. Denies eye redness, ear pain, ringing in the ears, wax buildup, runny nose or sore throat. Respiratory: Positive cough. Denies difficulty breathing or shortness of breath.  Cardiovascular: Denies chest pain, chest tightness, palpitations or swelling in the hands or feet.   No other specific complaints in a complete review of systems (except as listed in HPI above).  Objective:  BP (!) 134/55 (BP Location: Right Arm, Patient Position: Sitting, Cuff Size: Normal)   Pulse (!) 102   Temp 97.9 F (  36.6 C) (Temporal)   Resp 18   Ht 5\' 5"  (1.651 m)   Wt 184 lb 3.2 oz (83.6 kg)   SpO2 100%   BMI 30.65 kg/m    General: Appears her stated age, well developed, well nourished in NAD. HEENT: Head: normal shape and size, maxillary sinus tenderness; Throat/Mouth: + PND. Teeth present, mucosa erythematous and moist, no exudate noted, no lesions or ulcerations noted.  Neck:  No adenopathy noted.  Cardiovascular: Normal rate and rhythm. S1,S2 noted.  No murmur, rubs or gallops noted.  Pulmonary/Chest: Normal effort and positive vesicular breath sounds. No respiratory distress. No wheezes, rales or ronchi noted.       Assessment & Plan:   Acute Bacterial Sinusitis  Can use a Neti Pot which can be purchased from your local drug store. Flonase 2 sprays each nostril for 3 days and then as needed. eRx for Augmentin BID for 10 days  RTC as  needed or if symptoms persist. Webb Silversmith, NP This visit occurred during the SARS-CoV-2 public health emergency.  Safety protocols were in place, including screening questions prior to the visit, additional usage of staff PPE, and extensive cleaning of exam room while observing appropriate contact time as indicated for disinfecting solutions.

## 2021-04-04 ENCOUNTER — Ambulatory Visit: Payer: Self-pay

## 2021-04-04 ENCOUNTER — Telehealth: Payer: Self-pay

## 2021-04-04 ENCOUNTER — Encounter: Payer: Self-pay | Admitting: Internal Medicine

## 2021-04-04 NOTE — Telephone Encounter (Signed)
Last night 1730 coughed up phlegm and layed down and  had sudden right breast to back pain.Pt stated pain was so  severe almost called 911.  Better today taking 3 Advil every 4 hours. When coughing has to hold both hands to right breast when she coughs. Vomiting phlegm.  Pt asking if she can have an inhaler to help with harsh and hard cough.  Called office if any openings- per Apolonio Schneiders no available appts.  Advised pt to go to UC. Pt verbalized and will go to UC. Reason for Disposition  SEVERE coughing spells (e.g., whooping sound after coughing, vomiting after coughing)  Answer Assessment - Initial Assessment Questions 1. ONSET: "When did the muscle aches or body pains start?"      1730  2. LOCATION: "What part of your body is hurting?" (e.g., entire body, arms, legs)       Right breast through to back 3. SEVERITY: "How bad is the pain?" (Scale 1-10; or mild, moderate, severe)   - MILD (1-3): doesn't interfere with normal activities    - MODERATE (4-7): interferes with normal activities or awakens from sleep    - SEVERE (8-10):  excruciating pain, unable to do any normal activities      3-4/10 4. CAUSE: "What do you think is causing the pains?"     Pulled muscle 5. FEVER: "Have you been having fever?"     no 6. OTHER SYMPTOMS: "Do you have any other symptoms?" (e.g., chest pain, weakness, rash, cold or flu symptoms, weight loss)     Productive cough, no appetite, white to yellow 7. PREGNANCY: "Is there any chance you are pregnant?" "When was your last menstrual period?"     N/a 8. TRAVEL: "Have you traveled out of the country in the last month?" (e.g., travel history, exposures)     no  Answer Assessment - Initial Assessment Questions 1. ONSET: "When did the cough begin?"      2 weeks ago 2. SEVERITY: "How bad is the cough today?"      Coug is harsh and hard- vomitis after coughing 3. SPUTUM: "Describe the color of your sputum" (none, dry cough; clear, white, yellow, green)      Yellowish white 4. HEMOPTYSIS: "Are you coughing up any blood?" If so ask: "How much?" (flecks, streaks, tablespoons, etc.)     no 5. DIFFICULTY BREATHING: "Are you having difficulty breathing?" If Yes, ask: "How bad is it?" (e.g., mild, moderate, severe)    - MILD: No SOB at rest, mild SOB with walking, speaks normally in sentences, can lie down, no retractions, pulse < 100.    - MODERATE: SOB at rest, SOB with minimal exertion and prefers to sit, cannot lie down flat, speaks in phrases, mild retractions, audible wheezing, pulse 100-120.    - SEVERE: Very SOB at rest, speaks in single words, struggling to breathe, sitting hunched forward, retractions, pulse > 120      no 6. FEVER: "Do you have a fever?" If Yes, ask: "What is your temperature, how was it measured, and when did it start?"     no 7. CARDIAC HISTORY: "Do you have any history of heart disease?" (e.g., heart attack, congestive heart failure)      no 8. LUNG HISTORY: "Do you have any history of lung disease?"  (e.g., pulmonary embolus, asthma, emphysema)     no 9. PE RISK FACTORS: "Do you have a history of blood clots?" (or: recent major surgery, recent prolonged travel, bedridden)  no 10. OTHER SYMPTOMS: "Do you have any other symptoms?" (e.g., runny nose, wheezing, chest pain)       Right breast through to the back pain especially when coughing.  11. PREGNANCY: "Is there any chance you are pregnant?" "When was your last menstrual period?"       N/a 12. TRAVEL: "Have you traveled out of the country in the last month?" (e.g., travel history, exposures)       no  Protocols used: Muscle Aches and Body Pain-A-AH, Cough - Acute Productive-A-AH

## 2021-04-04 NOTE — Patient Instructions (Signed)

## 2021-04-04 NOTE — Telephone Encounter (Signed)
Copied from [place] [id number]. Topic: General - Other >> Apr 04, 2021  3:57 PM Tessa Lerner A wrote: Reason for CRM: The patient has called to request orders for an xray of their chest   They have continued to experience a previously discussed discomfort in the right portion of their chest  The patient was directed by a member of staff to go to the Urgent Care in [place] but redirected by a member of UC staff to call their PCP  Please contact further when possible

## 2021-04-05 ENCOUNTER — Ambulatory Visit: Payer: Self-pay | Admitting: *Deleted

## 2021-04-05 DIAGNOSIS — E119 Type 2 diabetes mellitus without complications: Secondary | ICD-10-CM | POA: Diagnosis not present

## 2021-04-05 NOTE — Telephone Encounter (Signed)
Pt calling back. States UC advised CXR at [place].. States righted sided chest pain at breast area "Maybe a hair better but still excruciating  when I cough or sneeze."  Reports also presents with deep breathing, radiates to back. States "Might be a pulled muscle but I'd really like to know and have a picture." Assured pt NT would route to practice for PCPs review and final disposition.   Please advise: [phone]

## 2021-04-05 NOTE — Telephone Encounter (Signed)
Appt schedule appt for Friday, Dec 2nd .

## 2021-04-05 NOTE — Telephone Encounter (Signed)
Why is she requesting this?  Her lungs were clear on exam yesterday and she was started on antibiotics for a sinus infection.

## 2021-04-05 NOTE — Telephone Encounter (Signed)
When did she go to urgent care?  And why did they not order the chest x-ray?  I have no appointments available for the rest of this week.  I may be able to add her on Friday if the pain persists.

## 2021-04-06 ENCOUNTER — Ambulatory Visit (INDEPENDENT_AMBULATORY_CARE_PROVIDER_SITE_OTHER): Payer: Medicare HMO | Admitting: Internal Medicine

## 2021-04-06 ENCOUNTER — Other Ambulatory Visit: Payer: Self-pay

## 2021-04-06 ENCOUNTER — Encounter: Payer: Self-pay | Admitting: Internal Medicine

## 2021-04-06 VITALS — BP 126/57 | HR 78 | Temp 97.1°F | Resp 17 | Ht 65.0 in | Wt 184.2 lb

## 2021-04-06 DIAGNOSIS — M940 Chondrocostal junction syndrome [Tietze]: Secondary | ICD-10-CM | POA: Diagnosis not present

## 2021-04-06 DIAGNOSIS — R079 Chest pain, unspecified: Secondary | ICD-10-CM | POA: Diagnosis not present

## 2021-04-06 NOTE — Patient Instructions (Signed)
Costochondritis Costochondritis is irritation and swelling (inflammation) of the tissue that connects the ribs to the breastbone (sternum). This tissue is called cartilage. Costochondritis causes pain in the front of the chest. Usually, the pain: Starts slowly. Is in more than one rib. What are the causes? The exact cause of this condition is not always known. It results from stress on the tissue in the affected area. The cause of this stress could be: Chest injury. Exercise or activity, such as lifting. Very bad coughing. What increases the risk? You are more likely to develop this condition if you: Are female. Are 29-66 years old. Recently started a new exercise or work activity. Have low levels of vitamin D. Have a condition that makes you cough often. What are the signs or symptoms? The main symptom of this condition is chest pain. The pain: Usually starts slowly and can be sharp or dull. Gets worse with deep breathing, coughing, or exercise. Gets better with rest. May be worse when you press on the affected area of your ribs and breastbone. How is this treated? This condition usually goes away on its own over time. Your doctor may prescribe an NSAID, such as ibuprofen. This can help reduce pain and inflammation. Treatment may also include: Resting and avoiding activities that make pain worse. Putting heat or ice on the painful area. Doing exercises to stretch your chest muscles. If these treatments do not help, your doctor may inject a numbing medicine to help relieve the pain. Follow these instructions at home: Managing pain, stiffness, and swelling   If told, put ice on the painful area. To do this: Put ice in a plastic bag. Place a towel between your skin and the bag. Leave the ice on for 20 minutes, 2-3 times a day. If told, put heat on the affected area. Do this as often as told by your doctor. Use the heat source that your doctor recommends, such as a moist heat pack or  a heating pad. Place a towel between your skin and the heat source. Leave the heat on for 20-30 minutes. Take off the heat if your skin turns bright red. This is very important if you cannot feel pain, heat, or cold. You may have a greater risk of getting burned. Activity Rest as told by your doctor. Do not do anything that makes your pain worse. This includes any activities that use chest, belly (abdomen), and side muscles. Do not lift anything that is heavier than 10 lb (4.5 kg), or the limit that you are told, until your doctor says that it is safe. Return to your normal activities as told by your doctor. Ask your doctor what activities are safe for you. General instructions Take over-the-counter and prescription medicines only as told by your doctor. Keep all follow-up visits as told by your doctor. This is important. Contact a doctor if: You have chills or a fever. Your pain does not go away or it gets worse. You have a cough that does not go away. Get help right away if: You are short of breath. You have very bad chest pain that is not helped by medicines, heat, or ice. These symptoms may be an emergency. Do not wait to see if the symptoms will go away. Get medical help right away. Call your local emergency services (911 in the [place] Do not drive yourself to the hospital. Summary Costochondritis is irritation and swelling (inflammation) of the tissue that connects the ribs to the breastbone (sternum). This condition  causes pain in the front of the chest. Treatment may include medicines, rest, heat or ice, and exercises. This information is not intended to replace advice given to you by your health care provider. Make sure you discuss any questions you have with your health care provider. Document Revised: 03/06/2019 Document Reviewed: 03/06/2019 Elsevier Patient Education  2022 [employer].

## 2021-04-06 NOTE — Progress Notes (Signed)
Subjective:    Patient ID: [name], female    DOB: 1955/10/01, 65 y.o.   MRN: [id number]  HPI  Patient presents the clinic today with complaint of right side chest pain.  She reports this started 4 days ago after coughing vigorously.  She describes the pain as sharp and burning.  The pain radiates through to her shoulder blade.  She was seen 11/28 for URI symptoms.  She was diagnosed with a sinus infection and treated with Augmentin and cough syrup.  She reports her sinus symptoms have improved.  The chest pain is not as bad today as it has been.  She denies any difficulty breathing or shortness of breath.  She has not noticed a rash in the area.  She has tried Ibuprofen OTC with some relief of symptoms.  Review of Systems     Past Medical History:  Diagnosis Date   Blood transfusion    1984   [facility]   Chronic kidney disease    occasional renal stone   Complication of anesthesia    woke during surgery (ablation) 2006?   Diabetes mellitus    type 2 Diab- A1C-7   GERD (gastroesophageal reflux disease)    occasional Protonix-will take protonix night before surgery   Hypercholesteremia    on meds    Current Outpatient Medications  Medication Sig Dispense Refill   amoxicillin-clavulanate (AUGMENTIN) 875-125 MG tablet Take 1 tablet by mouth 2 (two) times daily. 20 tablet 0   B-D UF III MINI PEN NEEDLES 31G X 5 MM MISC Use 4x a day 200 each 3   Calcium Carbonate-Vitamin D (CALTRATE 600+D PO) Take by mouth.     Dulaglutide (TRULICITY) 1.5 BZ/1.6RC SOPN Inject 1.5 weekly into the skin 12 mL 3   fenofibrate (TRICOR) 145 MG tablet Take 1 tablet (145 mg total) by mouth daily. 90 tablet 1   glipiZIDE (GLUCOTROL) 10 MG tablet Take 0.5 tablets (5 mg total) by mouth 2 (two) times daily before a meal. (Patient taking differently: Take 10 mg by mouth 2 (two) times daily before a meal.) 90 tablet 3   guaiFENesin (MUCINEX) 600 MG 12 hr tablet Take by mouth 2 (two) times daily.     ibuprofen  (ADVIL) 200 MG tablet Take 200 mg by mouth every 6 (six) hours as needed.     Insulin Glargine (BASAGLAR KWIKPEN) 100 UNIT/ML INJECT 44 UNITS INTO THE SKIN AT BEDTIME 30 mL 2   metFORMIN (GLUCOPHAGE) 1000 MG tablet Take 1 tablet (1,000 mg total) by mouth 2 (two) times daily with a meal. 180 tablet 2   Multiple Vitamins-Minerals (MULTIVITAMINS THER. W/MINERALS) TABS Take 1 tablet by mouth daily.     Omega-3 Fatty Acids (FISH OIL) 500 MG CAPS Take 500 mg by mouth daily.     OVER THE COUNTER MEDICATION Take 1 tablet by mouth 2 (two) times daily. PATIENT TAKES CINNAMON TABLETS 1000mg      pantoprazole (PROTONIX) 40 MG tablet Take 1 tablet (40 mg total) by mouth 2 (two) times daily. 180 tablet 1   rosuvastatin (CRESTOR) 10 MG tablet TAKE 1 TABLET BY MOUTH EVERY DAY 90 tablet 1   No current facility-administered medications for this visit.    Allergies  Allergen Reactions   Jardiance [Empagliflozin]     Myalgias   Sulfa Antibiotics     Other reaction(s): Edema    Family History  Problem Relation Age of Onset   Hypertension Mother    Hyperlipidemia Mother  Heart disease Mother    Heart disease Father    Hyperlipidemia Father    Diabetes Father    Heart attack Father    Varicose Veins Father    Peripheral vascular disease Father        amputation   Cancer Paternal Aunt        lung/ brain    Social History   Socioeconomic History   Marital status: Married    Spouse name: Not on file   Number of children: Not on file   Years of education: Not on file   Highest education level: Not on file  Occupational History   Not on file  Tobacco Use   Smoking status: Former    Packs/day: 0.75    Years: 30.00    Pack years: 22.50    Types: Cigarettes   Smokeless tobacco: Never   Tobacco comments:    recently quit 03/01/2015  Vaping Use   Vaping Use: Never used  Substance and Sexual Activity   Alcohol use: No    Alcohol/week: 0.0 standard drinks   Drug use: No   Sexual activity:  Yes  Other Topics Concern   Not on file  Social History Narrative   Not on file   Social Determinants of Health   Financial Resource Strain: Not on file  Food Insecurity: Not on file  Transportation Needs: Not on file  Physical Activity: Not on file  Stress: Not on file  Social Connections: Not on file  Intimate Partner Violence: Not on file     Constitutional: Denies fever, malaise, fatigue, headache or abrupt weight changes.  HEENT: Denies eye pain, eye redness, ear pain, ringing in the ears, wax buildup, runny nose, nasal congestion, bloody nose, or sore throat. Respiratory: Denies difficulty breathing, shortness of breath, cough or sputum production.   Cardiovascular: Patient reports chest pain.  Denies chest tightness, palpitations or swelling in the hands or feet.  Gastrointestinal: Denies abdominal pain, bloating, constipation, diarrhea or blood in the stool.  GU: Denies urgency, frequency, pain with urination, burning sensation, blood in urine, odor or discharge. Musculoskeletal: Patient reports right-sided chest pain.  Denies decrease in range of motion, difficulty with gait, or joint pain and swelling.  Skin: Denies redness, rashes, lesions or ulcercations.    No other specific complaints in a complete review of systems (except as listed in HPI above).  Objective:   Physical Exam BP (!) 126/57 (BP Location: Right Arm, Patient Position: Sitting, Cuff Size: Normal)   Pulse 78   Temp (!) 97.1 F (36.2 C) (Temporal)   Resp 17   Ht 5\' 5"  (1.651 m)   Wt 184 lb 3.2 oz (83.6 kg)   SpO2 100%   BMI 30.65 kg/m   Wt Readings from Last 3 Encounters:  04/03/21 184 lb 3.2 oz (83.6 kg)  01/31/21 191 lb 3.2 oz (86.7 kg)  11/10/20 190 lb 12.8 oz (86.5 kg)    General: Appears her stated age, obese, in NAD. Skin: Warm, dry and intact. No rashes noted. HEENT: Head: normal shape and size; Eyes: sclera white and EOMs intact;  Cardiovascular: Normal rate and rhythm. S1,S2 noted.   No murmur, rubs or gallops noted. Pulmonary/Chest: Normal effort and positive vesicular breath sounds. No respiratory distress. No wheezes, rales or ronchi noted.  Musculoskeletal: Pain with palpation of the right anterior chest Jarquin. Neurological: Alert and oriented.  BMET    Component Value Date/Time   NA 138 01/31/2021 0905   K 4.7 01/31/2021 0905  CL 103 01/31/2021 0905   CO2 26 01/31/2021 0905   GLUCOSE 210 (H) 01/31/2021 0905   BUN 14 01/31/2021 0905   CREATININE 0.73 01/31/2021 0905   CALCIUM 9.4 01/31/2021 0905   GFRNONAA 76 09/27/2016 0854   GFRAA 87 09/27/2016 0854    Lipid Panel     Component Value Date/Time   CHOL 163 01/31/2021 0905   TRIG 535 (H) 01/31/2021 0905   HDL 57 01/31/2021 0905   CHOLHDL 2.9 01/31/2021 0905   VLDL 60.8 (H) 12/17/2019 0901   LDLCALC  01/31/2021 0905     Comment:     . LDL cholesterol not calculated. Triglyceride levels greater than 400 mg/dL invalidate calculated LDL results. . Reference range: <100 . Desirable range <100 mg/dL for primary prevention;   <70 mg/dL for patients with CHD or diabetic patients  with > or = 2 CHD risk factors. Marland Kitchen LDL-C is now calculated using the Martin-Hopkins  calculation, which is a validated novel method providing  better accuracy than the Friedewald equation in the  estimation of LDL-C.  Cresenciano Genre et al. Annamaria Helling. 2671;245(80): 2061-2068  (http://education.[facility].com/faq/FAQ164)     CBC    Component Value Date/Time   WBC 7.0 01/31/2021 0905   RBC 4.32 01/31/2021 0905   HGB 12.6 01/31/2021 0905   HCT 39.2 01/31/2021 0905   PLT 249 01/31/2021 0905   MCV 90.7 01/31/2021 0905   MCH 29.2 01/31/2021 0905   MCHC 32.1 01/31/2021 0905   RDW 12.4 01/31/2021 0905   LYMPHSABS 3.1 01/07/2014 1402   MONOABS 0.6 01/07/2014 1402   EOSABS 0.1 01/07/2014 1402   BASOSABS 0.0 01/07/2014 1402    Hgb A1C Lab Results  Component Value Date   HGBA1C 8.4 (H) 01/31/2021             Assessment & Plan:   Right-Sided Chest Pain secondary to Costochondritis:  Encourage rest Can continue Ibuprofen OTC as needed, no more than 2400 mg in 24 hours Ice may be helpful No indication for x-ray at this time Reassurance provided that this would improve with time  Return precautions discussed  Webb Silversmith, NP This visit occurred during the SARS-CoV-2 public health emergency.  Safety protocols were in place, including screening questions prior to the visit, additional usage of staff PPE, and extensive cleaning of exam room while observing appropriate contact time as indicated for disinfecting solutions.

## 2021-04-07 ENCOUNTER — Ambulatory Visit: Payer: Medicare HMO | Admitting: Internal Medicine

## 2021-04-13 ENCOUNTER — Other Ambulatory Visit: Payer: Self-pay

## 2021-04-13 ENCOUNTER — Ambulatory Visit
Admission: RE | Admit: 2021-04-13 | Discharge: 2021-04-13 | Disposition: A | Discharge status: Home / Self Care | Payer: Medicare HMO | Source: Ambulatory Visit | Attending: Obstetrics and Gynecology | Admitting: Obstetrics and Gynecology

## 2021-04-13 DIAGNOSIS — Z780 Asymptomatic menopausal state: Secondary | ICD-10-CM | POA: Diagnosis not present

## 2021-04-13 DIAGNOSIS — E2839 Other primary ovarian failure: Secondary | ICD-10-CM

## 2021-04-13 DIAGNOSIS — M810 Age-related osteoporosis without current pathological fracture: Secondary | ICD-10-CM | POA: Diagnosis not present

## 2021-04-13 DIAGNOSIS — M8588 Other specified disorders of bone density and structure, other site: Secondary | ICD-10-CM | POA: Diagnosis not present

## 2021-04-17 ENCOUNTER — Ambulatory Visit: Payer: Medicare HMO | Admitting: Internal Medicine

## 2021-04-21 ENCOUNTER — Telehealth: Payer: Self-pay | Admitting: *Deleted

## 2021-04-21 NOTE — Telephone Encounter (Signed)
Message left for pt to return call regarding CORE research study

## 2021-05-06 ENCOUNTER — Other Ambulatory Visit: Payer: Self-pay | Admitting: Internal Medicine

## 2021-05-09 ENCOUNTER — Encounter: Payer: Self-pay | Admitting: Internal Medicine

## 2021-05-09 ENCOUNTER — Other Ambulatory Visit: Payer: Self-pay

## 2021-05-09 ENCOUNTER — Ambulatory Visit (INDEPENDENT_AMBULATORY_CARE_PROVIDER_SITE_OTHER): Payer: HMO | Admitting: Internal Medicine

## 2021-05-09 VITALS — BP 120/68 | HR 82 | Ht 65.0 in | Wt 183.8 lb

## 2021-05-09 DIAGNOSIS — Z794 Long term (current) use of insulin: Secondary | ICD-10-CM

## 2021-05-09 DIAGNOSIS — E1165 Type 2 diabetes mellitus with hyperglycemia: Secondary | ICD-10-CM

## 2021-05-09 DIAGNOSIS — E782 Mixed hyperlipidemia: Secondary | ICD-10-CM

## 2021-05-09 LAB — POCT GLYCOSYLATED HEMOGLOBIN (HGB A1C): Hemoglobin A1C: 8.5 % — AB (ref 4.0–5.6)

## 2021-05-09 MED ORDER — FREESTYLE LIBRE 2 SENSOR MISC: 1.0000 | 3 refills | Status: DC

## 2021-05-09 MED ORDER — FREESTYLE LIBRE 2 READER DEVI: 1.0000 | Freq: Every day | 0 refills | Status: DC

## 2021-05-09 MED ORDER — TRULICITY 3 MG/0.5ML ~~LOC~~ SOAJ: 3.0000 mg | SUBCUTANEOUS | 3 refills | Status: DC

## 2021-05-09 MED ORDER — ACCU-CHEK GUIDE VI STRP: ORAL_STRIP | 5 refills | Status: DC

## 2021-05-09 MED ORDER — BASAGLAR KWIKPEN 100 UNIT/ML ~~LOC~~ SOPN: 44.0000 [IU] | PEN_INJECTOR | Freq: Every day | SUBCUTANEOUS | 3 refills | Status: DC

## 2021-05-09 NOTE — Progress Notes (Signed)
Patient ID: [name], female   DOB: 04-Dec-1955, 66 y.o.   MRN: [id number]   This visit occurred during the SARS-CoV-2 public health emergency.  Safety protocols were in place, including screening questions prior to the visit, additional usage of staff PPE, and extensive cleaning of exam room while observing appropriate contact time as indicated for disinfecting solutions.   HPI: [name] is a 66 y.o.-year-old female, returning for f/u for DM2, dx in 2013, insulin-dependent since 2016, uncontrolled, without long term complications.   Last visit 6 months ago.  Interim history: No increased urination, blurry vision, nausea, chest pain. She has weight loss after starting Trulicity. She continues to have mild tremors. She recently had a skin cancer removed from face - with excellent cosmetic results. She iss out of Basaglar for the last 3 weeks - no refills from the pharmacy.  Reviewed HbA1c levels: Lab Results  Component Value Date   HGBA1C 8.4 (H) 01/31/2021   HGBA1C 12.1 (A) 09/26/2020   HGBA1C 11.4 (A) 05/09/2020   HGBA1C 10.1 (A) 02/04/2020   HGBA1C 6.9 (A) 09/29/2019   HGBA1C 8.6 (H) 07/02/2019   HGBA1C 8.3 (A) 05/29/2019   HGBA1C 8.7 (H) 12/15/2018   HGBA1C 9.6 (A) 10/16/2018   HGBA1C 11.2 (A) 07/18/2018   HGBA1C 8.6 (H) 12/09/2017   HGBA1C 8.2 06/25/2017   HGBA1C 7.8 01/16/2017   HGBA1C 8.9 09/27/2016   HGBA1C 9.8 (H) 07/05/2016   HGBA1C 10.4 (H) 03/28/2016   HGBA1C 8.4 (H) 12/27/2015   HGBA1C 9.0 (H) 06/29/2015   HGBA1C 10.0 (H) 03/24/2015   HGBA1C 8.8 (H) 04/19/2014   HGBA1C 11.6 (H) 01/07/2014   HGBA1C 12.2 (H) 10/06/2013   HGBA1C 9.4 (H) 04/27/2013   Pt is on a regimen of: - Metformin  1000 >> 500 mg >> 1000 2x a day, with meals - Glipizide 10 mg 20-30 minutes before breakfast and dinner-restarted 19/6222  (forgot) - Trulicity 1.5 mg weekly -restarted 09/2020 - decreased appetite, no more nausea - >> Basaglar 44 units daily - NovoLog 8 units before B and 10  units before D >> 10-15 units before meals She had mm cramps from Invokana. We tried to start Farxiga 10 mg before b'fast >> did not get it from pharmacy. She was previously on Trulicity, but stopped due to cost and also nausea.  We tried Ozempic but she again had problems with constant nausea.  In 09/2020, I again suggested to try a low-dose Trulicity.   Pt checks her sugars 2-4 times a day: - am: 150-330 >> 175-220 >> 90x, 112-131 >> 96-130, 140 - 2h after b'fast: 138-165 >> 200s >> n/c >> 139-147 >> n/c - before lunch: 150-250 >> 200s  >> n/c - 2h after lunch: <150 >> n/c >> 150-170 >> n/c>> 140-150s, 160 - before dinner: 150-250 >> 215-350 >> n/c - 2h after dinner: 200-280 >> 197 on Ozempic >> n/c - bedtime: n/c >> 179  >> n/c >> 161-181 >> 175-196 - nighttime: n/c >> 212 Lowest sugar was 126 >> 175 >> 90s>> 96; it is unclear at which ever she has hypoglycemia awareness. Highest sugar was 330 >> 345 >> 212 >> 196.  Glucometer: OneTouch Ultra >> One Touch Verio Flex  Pt's meals are: - Breakfast: grits, meat or cereals - Lunch: sandwich or bowl of cereal - Dinner: 2-3x a week eats out: chicken, steak, ribs - Snacks: , fruit -watermelon No sodas.  She drinks water with Crystal light  -No CKD, last BUN/creatinine:  Lab Results  Component Value Date   BUN 14 01/31/2021   BUN 17 12/17/2019   CREATININE 0.73 01/31/2021   CREATININE 0.88 12/17/2019   -+ HL; last set of lipids: Lab Results  Component Value Date   CHOL 163 01/31/2021   HDL 57 01/31/2021   LDLCALC  01/31/2021     Comment:     . LDL cholesterol not calculated. Triglyceride levels greater than 400 mg/dL invalidate calculated LDL results. . Reference range: <100 . Desirable range <100 mg/dL for primary prevention;   <70 mg/dL for patients with CHD or diabetic patients  with > or = 2 CHD risk factors. Marland Kitchen LDL-C is now calculated using the Martin-Hopkins  calculation, which is a validated novel method providing   better accuracy than the Friedewald equation in the  estimation of LDL-C.  Cresenciano Genre et al. Annamaria Helling. 8413;244(01): 2061-2068  (http://education.[facility].com/faq/FAQ164)    LDLDIRECT 63.0 12/17/2019   TRIG 535 (H) 01/31/2021   CHOLHDL 2.9 01/31/2021  On Crestor, fenofibrate, fish oil  - last eye exam was 03/07/2021: No DR reportedly.  - no numbness and tingling in her feet.  She had vertigo >> seeing specialist at [facility] >> changed from meclizine to Valium.  She has a microphone in the right ear which transmits to the left.   Her husband, Caysie Minnifield, was also my patient - he had terminal liver cancer-died 07-03-19.  ROS: + see HPI + Insomnia + tremors in thumbs  I reviewed pt's medications, allergies, PMH, social hx, family hx, and changes were documented in the history of present illness. Otherwise, unchanged from my initial visit note.  Past Medical History:  Diagnosis Date   Blood transfusion    1984   [facility]   Chronic kidney disease    occasional renal stone   Complication of anesthesia    woke during surgery (ablation) 2006?   Diabetes mellitus    type 2 Diab- A1C-7   GERD (gastroesophageal reflux disease)    occasional Protonix-will take protonix night before surgery   Hypercholesteremia    on meds   Past Surgical History:  Procedure Laterality Date   BLADDER SUSPENSION  12/08/2010   Procedure: TRANSVAGINAL TAPE (TVT) PROCEDURE;  Surgeon: Arloa Koh;  Location: [facility];  Service: Gynecology;  Laterality: N/A;  transvaginal tape with cystoscopy   BLADDER SUSPENSION  01/30/2011   Procedure: TRANSVAGINAL TAPE (TVT) PROCEDURE;  Surgeon: Arloa Koh;  Location: [facility];  Service: Gynecology;  Laterality: N/A;  excision of excess sling from suprapubic incision and cystoscopy   CERVICAL DISC SURGERY  2004   CYSTOSCOPY  01/30/2011   Procedure: CYSTOSCOPY;  Surgeon: Arloa Koh;  Location: [facility];  Service: Gynecology;  Laterality: N/A;   TONSILLECTOMY  1976    transvaginal tape  8/12   uterine ablation     Social History   Social History   Marital status: Married    Spouse name: N/A   Number of children: 2   Occupational History   retired   Social History Main Topics   Smoking status: Former Smoker    Packs/day: 0.75    Years: 30.00    Types: Cigarettes   Smokeless tobacco: Never Used     Comment: recently quit 03/01/2015   Alcohol use No   Drug use: No   Current Outpatient Medications on File Prior to Visit  Medication Sig Dispense Refill   amoxicillin-clavulanate (AUGMENTIN) 875-125 MG tablet Take 1 tablet by mouth 2 (two) times daily. [place]  tablet 0   B-D UF III MINI PEN NEEDLES 31G X 5 MM MISC Use 4x a day 200 each 3   Calcium Carbonate-Vitamin D (CALTRATE 600+D PO) Take by mouth.     Dulaglutide (TRULICITY) 1.5 QI/6.9GE SOPN Inject 1.5 weekly into the skin 12 mL 3   fenofibrate (TRICOR) 145 MG tablet Take 1 tablet (145 mg total) by mouth daily. 90 tablet 1   glipiZIDE (GLUCOTROL) 10 MG tablet Take 0.5 tablets (5 mg total) by mouth 2 (two) times daily before a meal. (Patient taking differently: Take 10 mg by mouth 2 (two) times daily before a meal.) 90 tablet 3   guaiFENesin (MUCINEX) 600 MG 12 hr tablet Take by mouth 2 (two) times daily.     ibuprofen (ADVIL) 200 MG tablet Take 200 mg by mouth every 6 (six) hours as needed. (Patient not taking: Reported on 04/06/2021)     Insulin Glargine (BASAGLAR KWIKPEN) 100 UNIT/ML INJECT 44 UNITS INTO THE SKIN AT BEDTIME 15 mL 0   metFORMIN (GLUCOPHAGE) 1000 MG tablet Take 1 tablet (1,000 mg total) by mouth 2 (two) times daily with a meal. 180 tablet 2   Multiple Vitamins-Minerals (MULTIVITAMINS THER. W/MINERALS) TABS Take 1 tablet by mouth daily.     Omega-3 Fatty Acids (FISH OIL) 500 MG CAPS Take 500 mg by mouth daily.     OVER THE COUNTER MEDICATION Take 1 tablet by mouth 2 (two) times daily. PATIENT TAKES CINNAMON TABLETS 1000mg      pantoprazole (PROTONIX) 40 MG tablet Take 1 tablet (40 mg  total) by mouth 2 (two) times daily. 180 tablet 1   pseudoephedrine-guaifenesin (TUSSIN PE) 30-100 MG/5ML SYRP Take 5 mLs by mouth every 4 (four) hours as needed.     rosuvastatin (CRESTOR) 10 MG tablet TAKE 1 TABLET BY MOUTH EVERY DAY 90 tablet 1   No current facility-administered medications on file prior to visit.   Allergies  Allergen Reactions   Jardiance [Empagliflozin]     Myalgias   Sulfa Antibiotics     Other reaction(s): Edema   Family History  Problem Relation Age of Onset   Hypertension Mother    Hyperlipidemia Mother    Heart disease Mother    Heart disease Father    Hyperlipidemia Father    Diabetes Father    Heart attack Father    Varicose Veins Father    Peripheral vascular disease Father        amputation   Cancer Paternal Aunt        lung/ brain   PE: BP 120/68 (BP Location: Right Arm, Patient Position: Sitting, Cuff Size: Normal)    Pulse 82    Ht 5\' 5"  (1.651 m)    Wt 183 lb 12.8 oz (83.4 kg)    SpO2 98%    BMI 30.59 kg/m  Wt Readings from Last 3 Encounters:  05/09/21 183 lb 12.8 oz (83.4 kg)  04/06/21 184 lb 3.2 oz (83.6 kg)  04/03/21 184 lb 3.2 oz (83.6 kg)   Constitutional: overweight, in NAD Eyes: PERRLA, EOMI, no exophthalmos ENT: moist mucous membranes, no thyromegaly, no cervical lymphadenopathy Cardiovascular: RRR, No MRG Respiratory: CTA B Musculoskeletal: no deformities, strength intact in all 4 Skin: moist, warm, no rashes Neurological: no tremor with outstretched hands, DTR normal in all 4  ASSESSMENT: 1. DM2, insulin-dependent, uncontrolled, without long term complications, but with hyperglycemia  2. Overweight  3. HL  PLAN:  1. Patient with longstanding, uncontrolled, type 2 diabetes, on a complex medication regimen  including metformin, sulfonylurea, basal-bolus insulin regimen, and often on GLP-1 receptor agonist due to price and also side effects (nausea).  Before last visit, HbA1c increased to as much as 12.1%.  At that time,  we increased her metformin dose, NovoLog, and restarted Trulicity.  At last visit, she was able to start Trulicity, although this was expensive.  She initially had nausea but this improved.  She did have decreased appetite and slight weight loss at last visit.  She was very happy with the impact of Trulicity on her blood sugars.  They were approximately 50% lower than before, and excellent improvement.  I advised her to decrease her glipizide to 5 mg before meals and in 2 to 3 weeks to stop the morning glipizide if sugars remain controlled.  She had an HbA1c 3 months ago and this was much better, at 8.4%. - at this visit, she tells me that she did not decrease the dose of glipizide, since her bottle at home did not mention the decrease in dose.  I advised her to always take go by the instructions given at the time of the visit.  At this visit, she was also out of [place] as she did not receive it from the pharmacy for the last 3 weeks.  Her blood sugars are fairly controlled in the morning so we discussed about restarting Basaglar at a lower dose and increasing it gradually to 44 units.  I am hoping she does not need to go quite all the way up to this dose, but discussed about titrating based on the sugars.  I also advised her to try to increase Trulicity further since her sugars are increasing throughout the day.  In the meantime, we will try to decrease the glipizide to only 5 mg before breakfast and dinner. -At this visit, I also suggested to get a freestyle libre CGM.  I sent the prescription to her pharmacy.  She has Medicare but also supplemental insurance. -  I suggested to:  Patient Instructions  Please continue: - Metformin 1000 mg 2x a day with meals - Basaglar 44 units at night - NovoLog 10-15 units before each meal  Increase: - Trulicity 3 mg weekly  If sugars improve after increasing Trulicity, decrease: - Glipizide 5 mg before b'fast and dinner  Please return in 3 months with your  sugar log.   - we checked her HbA1c: 8.5% (slightly higher) - advised to check sugars at different times of the day - 4x a day, rotating check times - advised for yearly eye exams >> she is not UTD - return to clinic in 3 months  2. Overweight -continue SGLT 2 inhibitor and GLP-1 receptor agonist which should also help with weight loss -She lost 2 pounds before last visit; lost 8 lbs since last OV  3. HL -Reviewed latest lipid panel from 01/2021: High triglycerides, the rest the fractions at goal: Lab Results  Component Value Date   CHOL 163 01/31/2021   HDL 57 01/31/2021   LDLCALC  01/31/2021     Comment:     . LDL cholesterol not calculated. Triglyceride levels greater than 400 mg/dL invalidate calculated LDL results. . Reference range: <100 . Desirable range <100 mg/dL for primary prevention;   <70 mg/dL for patients with CHD or diabetic patients  with > or = 2 CHD risk factors. Marland Kitchen LDL-C is now calculated using the Martin-Hopkins  calculation, which is a validated novel method providing  better accuracy than the Friedewald equation in  the  estimation of LDL-C.  Cresenciano Genre et al. Annamaria Helling. 7371;062(69): 2061-2068  (http://education.[facility].com/faq/FAQ164)    LDLDIRECT 63.0 12/17/2019   TRIG 535 (H) 01/31/2021   CHOLHDL 2.9 01/31/2021  -She is on Crestor 10, Tricor 145 and fish oil 500 mg daily, all tolerated well  Philemon Kingdom, MD PhD [facility] Endocrinology

## 2021-05-09 NOTE — Patient Instructions (Addendum)
Please continue: - Metformin 1000 mg 2x a day with meals - Basaglar 44 units at night - NovoLog 10-15 units before each meal  Increase: - Trulicity 3 mg weekly  If sugars improve after increasing Trulicity, decrease: - Glipizide 5 mg before b'fast and dinner  Please return in 3 months with your sugar log.

## 2021-05-16 ENCOUNTER — Other Ambulatory Visit: Payer: Self-pay | Admitting: Internal Medicine

## 2021-05-16 DIAGNOSIS — Z794 Long term (current) use of insulin: Secondary | ICD-10-CM

## 2021-05-16 DIAGNOSIS — E1165 Type 2 diabetes mellitus with hyperglycemia: Secondary | ICD-10-CM

## 2021-05-18 ENCOUNTER — Ambulatory Visit: Payer: Medicare HMO | Admitting: Internal Medicine

## 2021-06-09 ENCOUNTER — Other Ambulatory Visit: Payer: Self-pay

## 2021-06-09 ENCOUNTER — Ambulatory Visit: Payer: Self-pay

## 2021-06-09 DIAGNOSIS — E1165 Type 2 diabetes mellitus with hyperglycemia: Secondary | ICD-10-CM

## 2021-06-09 MED ORDER — FREESTYLE LIBRE 2 READER DEVI: 1.0000 | Freq: Every day | 0 refills | Status: DC

## 2021-06-09 MED ORDER — ONETOUCH VERIO VI STRP: ORAL_STRIP | 3 refills | Status: DC

## 2021-06-09 MED ORDER — ONETOUCH VERIO W/DEVICE KIT: PACK | 0 refills | Status: AC

## 2021-06-09 MED ORDER — FREESTYLE LIBRE 2 SENSOR MISC: 1.0000 | 3 refills | Status: DC

## 2021-06-09 MED ORDER — ONETOUCH DELICA LANCETS 33G MISC: 3 refills | Status: DC

## 2021-06-09 NOTE — Telephone Encounter (Signed)
° ° °  Chief Complaint: Feeling jittery inside, anxious. Anniversary of husbands death is in Jul 10, 2022 Symptoms: Having sleep difficulty Frequency: Started in November Pertinent Negatives: Patient denies any thoughts of self harm Disposition: [] ED /[] Urgent Care (no appt availability in office) / [x] Appointment(In office/virtual)/ []  [facility]/ [] Home Care/ [] Refused Recommended Disposition /[] [facility]/ []  Follow-up with PCP Additional Notes: Asking to be seen Monday if possible.   Reason for Disposition  [1] Symptoms of anxiety or panic AND [2] has not been evaluated for this by physician  Answer Assessment - Initial Assessment Questions 1. CONCERN: "Did anything happen that prompted you to call today?"      Anxiety 2. ANXIETY SYMPTOMS: "Can you describe how you (your loved one; patient) have been feeling?" (e.g., tense, restless, panicky, anxious, keyed up, overwhelmed, sense of impending doom).      Feels jittery inside 3. ONSET: "How long have you been feeling this way?" (e.g., hours, days, weeks)     November 4. SEVERITY: "How would you rate the level of anxiety?" (e.g., 0 - 10; or mild, moderate, severe).     Moderate 5. FUNCTIONAL IMPAIRMENT: "How have these feelings affected your ability to do daily activities?" "Have you had more difficulty than usual doing your normal daily activities?" (e.g., getting better, same, worse; self-care, school, work, interactions)     Insomnia 6. HISTORY: "Have you felt this way before?" "Have you ever been diagnosed with an anxiety problem in the past?" (e.g., generalized anxiety disorder, panic attacks, PTSD). If Yes, ask: "How was this problem treated?" (e.g., medicines, counseling, etc.)     No 7. RISK OF HARM - SUICIDAL IDEATION: "Do you ever have thoughts of hurting or killing yourself?" If Yes, ask:  "Do you have these feelings now?" "Do you have a plan on how you would do this?"     No 8. TREATMENT:  "What has been  done so far to treat this anxiety?" (e.g., medicines, relaxation strategies). "What has helped?"     No 9. TREATMENT - THERAPIST: "Do you have a counselor or therapist? Name?"     No 10. POTENTIAL TRIGGERS: "Do you drink caffeinated beverages (e.g., coffee, colas, teas), and how much daily?" "Do you drink alcohol or use any drugs?" "Have you started any new medicines recently?"     No 10. PATIENT SUPPORT: "Who is with you now?" "Who do you live with?" "Do you have family or friends who you can talk to?"        Yes 11. OTHER SYMPTOMS: "Do you have any other symptoms?" (e.g., feeling depressed, trouble concentrating, trouble sleeping, trouble breathing, palpitations or fast heartbeat, chest pain, sweating, nausea, or diarrhea)       Feels jittery 12. PREGNANCY: "Is there any chance you are pregnant?" "When was your last menstrual period?"       No  Protocols used: Anxiety and Panic Attack-A-AH

## 2021-06-12 NOTE — Telephone Encounter (Signed)
FYI...  Pt has apt 06/13/2021   Thanks,   -Mickel Baas

## 2021-06-12 NOTE — Telephone Encounter (Signed)
Will discuss at upcoming appt.

## 2021-06-13 ENCOUNTER — Encounter: Payer: Self-pay | Admitting: Internal Medicine

## 2021-06-13 ENCOUNTER — Telehealth (INDEPENDENT_AMBULATORY_CARE_PROVIDER_SITE_OTHER): Payer: HMO | Admitting: Internal Medicine

## 2021-06-13 DIAGNOSIS — R450 Nervousness: Secondary | ICD-10-CM | POA: Diagnosis not present

## 2021-06-13 DIAGNOSIS — F419 Anxiety disorder, unspecified: Secondary | ICD-10-CM

## 2021-06-13 DIAGNOSIS — R251 Tremor, unspecified: Secondary | ICD-10-CM | POA: Diagnosis not present

## 2021-06-13 DIAGNOSIS — F5104 Psychophysiologic insomnia: Secondary | ICD-10-CM

## 2021-06-13 MED ORDER — SERTRALINE HCL 25 MG PO TABS: 25.0000 mg | ORAL_TABLET | Freq: Every day | ORAL | 1 refills | Status: DC

## 2021-06-13 NOTE — Progress Notes (Signed)
Virtual Visit via Video Note  I connected with [name] on 06/13/21 at  4:00 PM EST by a video enabled telemedicine application and verified that I am speaking with the correct person using two identifiers.  Location: Patient: Home Provider: Office  Persons participating in this video call: Webb Silversmith, NP and [name]   I discussed the limitations of evaluation and management by telemedicine and the availability of in person appointments. The patient expressed understanding and agreed to proceed.  History of Present Illness:  Patient reports jitteriness.  She noticed this 3 months ago.  She noticed a feeling of hand shaking a few months ago. She has started noticing that she feels jittery inside all over. She has also has some difficulty falling asleep. She takes Melatonin but reports this causes her dizziness the next morning. Her sugars range 97-190, she denies hypoglycemia. Her last A1C was 8.5%, 05/2021. She has been feeling more anxious lately. She has never been treated for anxiety or depression in the past.  She is not seeing a therapist.  She denies current depression, SI/HI.   Past Medical History:  Diagnosis Date   Blood transfusion    1984   [facility]   Chronic kidney disease    occasional renal stone   Complication of anesthesia    woke during surgery (ablation) 2006?   Diabetes mellitus    type 2 Diab- A1C-7   GERD (gastroesophageal reflux disease)    occasional Protonix-will take protonix night before surgery   Hypercholesteremia    on meds    Current Outpatient Medications  Medication Sig Dispense Refill   B-D UF III MINI PEN NEEDLES 31G X 5 MM MISC Use 4x a day 200 each 3   Blood Glucose Monitoring Suppl (ONETOUCH VERIO) w/Device KIT Use as instructed to check blood sugar 3X daily 1 kit 0   Calcium Carbonate-Vitamin D (CALTRATE 600+D PO) Take by mouth.     Continuous Blood Gluc Receiver (FREESTYLE LIBRE 2 READER) DEVI 1 each by Does not apply route daily. 1  each 0   Continuous Blood Gluc Sensor (FREESTYLE LIBRE 2 SENSOR) MISC 1 each by Does not apply route every 14 (fourteen) days. 6 each 3   Dulaglutide (TRULICITY) 3 TD/3.2KG SOPN Inject 3 mg into the skin once a week. 6 mL 3   fenofibrate (TRICOR) 145 MG tablet Take 1 tablet (145 mg total) by mouth daily. 90 tablet 1   glipiZIDE (GLUCOTROL) 10 MG tablet Take 0.5 tablets (5 mg total) by mouth 2 (two) times daily before a meal. 90 tablet 3   glucose blood (ONETOUCH VERIO) test strip Use as instructed to check blood sugar 3X daily 300 each 3   ibuprofen (ADVIL) 200 MG tablet Take 200 mg by mouth every 6 (six) hours as needed.     Insulin Glargine (BASAGLAR KWIKPEN) 100 UNIT/ML Inject 44 Units into the skin at bedtime. 45 mL 3   metFORMIN (GLUCOPHAGE) 1000 MG tablet Take 1 tablet (1,000 mg total) by mouth 2 (two) times daily with a meal. 180 tablet 2   Multiple Vitamins-Minerals (MULTIVITAMINS THER. W/MINERALS) TABS Take 1 tablet by mouth daily.     Omega-3 Fatty Acids (FISH OIL) 500 MG CAPS Take 500 mg by mouth daily.     OVER THE COUNTER MEDICATION Take 1 tablet by mouth 2 (two) times daily. PATIENT TAKES CINNAMON TABLETS 1017m     pantoprazole (PROTONIX) 40 MG tablet Take 1 tablet (40 mg total) by mouth 2 (two)  times daily. 180 tablet 1   rosuvastatin (CRESTOR) 10 MG tablet TAKE 1 TABLET BY MOUTH EVERY DAY 90 tablet 1   amoxicillin-clavulanate (AUGMENTIN) 875-125 MG tablet Take 1 tablet by mouth 2 (two) times daily. 20 tablet 0   guaiFENesin (MUCINEX) 600 MG 12 hr tablet Take by mouth 2 (two) times daily.     OneTouch Delica Lancets 10C MISC Use as instructed to check blood sugar 3X daily (Patient not taking: Reported on 06/13/2021) 300 each 3   pseudoephedrine-guaifenesin (TUSSIN PE) 30-100 MG/5ML SYRP Take 5 mLs by mouth every 4 (four) hours as needed.     No current facility-administered medications for this visit.    Allergies  Allergen Reactions   Jardiance [Empagliflozin]     Myalgias    Sulfa Antibiotics     Other reaction(s): Edema    Family History  Problem Relation Age of Onset   Hypertension Mother    Hyperlipidemia Mother    Heart disease Mother    Heart disease Father    Hyperlipidemia Father    Diabetes Father    Heart attack Father    Varicose Veins Father    Peripheral vascular disease Father        amputation   Cancer Paternal Aunt        lung/ brain    Social History   Socioeconomic History   Marital status: Widowed    Spouse name: Not on file   Number of children: Not on file   Years of education: Not on file   Highest education level: Not on file  Occupational History   Not on file  Tobacco Use   Smoking status: Former    Packs/day: 0.75    Years: 30.00    Pack years: 22.50    Types: Cigarettes   Smokeless tobacco: Never   Tobacco comments:    recently quit 03/01/2015  Vaping Use   Vaping Use: Never used  Substance and Sexual Activity   Alcohol use: No    Alcohol/week: 0.0 standard drinks   Drug use: No   Sexual activity: Yes  Other Topics Concern   Not on file  Social History Narrative   Not on file   Social Determinants of Health   Financial Resource Strain: Not on file  Food Insecurity: Not on file  Transportation Needs: Not on file  Physical Activity: Not on file  Stress: Not on file  Social Connections: Not on file  Intimate Partner Violence: Not on file     Constitutional: Denies fever, malaise, fatigue, headache or abrupt weight changes.  Respiratory: Denies difficulty breathing, shortness of breath, cough or sputum production.   Cardiovascular: Denies chest pain, chest tightness, palpitations or swelling in the hands or feet.  Musculoskeletal: Denies decrease in range of motion, difficulty with gait, muscle pain or joint pain and swelling.  Neurological: Patient reports jitteriness, hand tremor, insomnia.  Denies dizziness, difficulty with memory, difficulty with speech or problems with balance and coordination.   Psych: Patient reports anxiety.  Denies depression, SI/HI.  No other specific complaints in a complete review of systems (except as listed in HPI above).  Observations/Objective:   Wt Readings from Last 3 Encounters:  05/09/21 183 lb 12.8 oz (83.4 kg)  04/06/21 184 lb 3.2 oz (83.6 kg)  04/03/21 184 lb 3.2 oz (83.6 kg)    General: In NAD. Pulmonary/Chest: Normal effort. No respiratory distress.  Neurological: Alert and oriented.  Psychiatric: Mood and affect normal. Mildly anxious. Judgment and thought content  normal.    BMET    Component Value Date/Time   NA 138 01/31/2021 0905   K 4.7 01/31/2021 0905   CL 103 01/31/2021 0905   CO2 26 01/31/2021 0905   GLUCOSE 210 (H) 01/31/2021 0905   BUN 14 01/31/2021 0905   CREATININE 0.73 01/31/2021 0905   CALCIUM 9.4 01/31/2021 0905   GFRNONAA 76 09/27/2016 0854   GFRAA 87 09/27/2016 0854    Lipid Panel     Component Value Date/Time   CHOL 163 01/31/2021 0905   TRIG 535 (H) 01/31/2021 0905   HDL 57 01/31/2021 0905   CHOLHDL 2.9 01/31/2021 0905   VLDL 60.8 (H) 12/17/2019 0901   LDLCALC  01/31/2021 0905     Comment:     . LDL cholesterol not calculated. Triglyceride levels greater than 400 mg/dL invalidate calculated LDL results. . Reference range: <100 . Desirable range <100 mg/dL for primary prevention;   <70 mg/dL for patients with CHD or diabetic patients  with > or = 2 CHD risk factors. Marland Kitchen LDL-C is now calculated using the Martin-Hopkins  calculation, which is a validated novel method providing  better accuracy than the Friedewald equation in the  estimation of LDL-C.  Cresenciano Genre et al. Annamaria Helling. 1657;903(83): 2061-2068  (http://education.Quest[employer].com/faq/FAQ164)     CBC    Component Value Date/Time   WBC 7.0 01/31/2021 0905   RBC 4.32 01/31/2021 0905   HGB 12.6 01/31/2021 0905   HCT 39.2 01/31/2021 0905   PLT 249 01/31/2021 0905   MCV 90.7 01/31/2021 0905   MCH 29.2 01/31/2021 0905   MCHC 32.1  01/31/2021 0905   RDW 12.4 01/31/2021 0905   LYMPHSABS 3.1 01/07/2014 1402   MONOABS 0.6 01/07/2014 1402   EOSABS 0.1 01/07/2014 1402   BASOSABS 0.0 01/07/2014 1402    Hgb A1C Lab Results  Component Value Date   HGBA1C 8.5 (A) 05/09/2021       Assessment and Plan:  Jitteriness, Hand Tremor, Anxiety, Insomnia:  It does not sound like she is having hypoglycemic episodes Discussed if this was related to anxiety versus essential tremor versus possible parkinsonism We will start Sertraline 25 mg daily to see if this helps with symptoms Support offered If symptoms persist or worsen, I would like to see her in the office for further evaluation  Follow Up Instructions:    I discussed the assessment and treatment plan with the patient. The patient was provided an opportunity to ask questions and all were answered. The patient agreed with the plan and demonstrated an understanding of the instructions.   The patient was advised to call back or seek an in-person evaluation if the symptoms worsen or if the condition fails to improve as anticipated.    Kizzie Furnish, CMA

## 2021-06-13 NOTE — Patient Instructions (Signed)

## 2021-07-05 ENCOUNTER — Other Ambulatory Visit: Payer: Self-pay | Admitting: Internal Medicine

## 2021-07-05 NOTE — Telephone Encounter (Signed)
Requested medication (s) are due for refill today - no ? ?Requested medication (s) are on the active medication list -yes ? ?Future visit scheduled -no ? ?Last refill: 06/13/21 #30 1RF ? ?Notes to clinic: Pharmacy request 90 day supply- Rx does not include that time frame, non delegated Rx ? ?Requested Prescriptions  ?Pending Prescriptions Disp Refills  ? sertraline (ZOLOFT) 25 MG tablet [Pharmacy Med Name: SERTRALINE HCL 25 MG TABLET] 90 tablet 1  ?  Sig: Take 1 tablet (25 mg total) by mouth daily.  ?  ? Not Delegated - Psychiatry:  Antidepressants - SSRI - sertraline Failed - 07/05/2021 11:32 AM  ?  ?  Failed - This refill cannot be delegated  ?  ?  Failed - ALT in normal range and within 360 days  ?  ALT  ?Date Value Ref Range Status  ?01/31/2021 30 (H) 6 - 29 U/L Final  ?  ?  ?  ?  Passed - AST in normal range and within 360 days  ?  AST  ?Date Value Ref Range Status  ?01/31/2021 19 10 - 35 U/L Final  ?  ?  ?  ?  Passed - Completed PHQ-2 or PHQ-9 in the last 360 days  ?  ?  Passed - Valid encounter within last 6 months  ?  Recent Outpatient Visits   ? ?      ? 3 weeks ago Feeling jittery  ? [facility] [place], [place] W, NP  ? 3 months ago Right-sided chest pain  ? [facility] [place], [place] W, NP  ? 3 months ago Acute non-recurrent maxillary sinusitis  ? [facility] [place], Coralie Keens, NP  ? 5 months ago Encounter for general adult medical examination with abnormal findings  ? [facility] [place], Coralie Keens, NP  ? ?  ?  ? ?  ?  ?  ? ? ? ?Requested Prescriptions  ?Pending Prescriptions Disp Refills  ? sertraline (ZOLOFT) 25 MG tablet [Pharmacy Med Name: SERTRALINE HCL 25 MG TABLET] 90 tablet 1  ?  Sig: Take 1 tablet (25 mg total) by mouth daily.  ?  ? Not Delegated - Psychiatry:  Antidepressants - SSRI - sertraline Failed - 07/05/2021 11:32 AM  ?  ?  Failed - This refill cannot be delegated  ?  ?  Failed - ALT in normal range and within 360 days  ?  ALT  ?Date  Value Ref Range Status  ?01/31/2021 30 (H) 6 - 29 U/L Final  ?  ?  ?  ?  Passed - AST in normal range and within 360 days  ?  AST  ?Date Value Ref Range Status  ?01/31/2021 19 10 - 35 U/L Final  ?  ?  ?  ?  Passed - Completed PHQ-2 or PHQ-9 in the last 360 days  ?  ?  Passed - Valid encounter within last 6 months  ?  Recent Outpatient Visits   ? ?      ? 3 weeks ago Feeling jittery  ? [facility] [place], [place] W, NP  ? 3 months ago Right-sided chest pain  ? [facility] [place], [place] W, NP  ? 3 months ago Acute non-recurrent maxillary sinusitis  ? [facility] [place], Coralie Keens, NP  ? 5 months ago Encounter for general adult medical examination with abnormal findings  ? [facility] [place], Coralie Keens, NP  ? ?  ?  ? ?  ?  ?  ? ? ? ?

## 2021-07-17 DIAGNOSIS — L720 Epidermal cyst: Secondary | ICD-10-CM | POA: Diagnosis not present

## 2021-07-17 DIAGNOSIS — L905 Scar conditions and fibrosis of skin: Secondary | ICD-10-CM | POA: Diagnosis not present

## 2021-07-27 ENCOUNTER — Other Ambulatory Visit: Payer: Self-pay | Admitting: Internal Medicine

## 2021-07-29 NOTE — Telephone Encounter (Signed)
Requested Prescriptions  ?Pending Prescriptions Disp Refills  ?? pantoprazole (PROTONIX) 40 MG tablet [Pharmacy Med Name: PANTOPRAZOLE SOD DR 40 MG TAB] 180 tablet 1  ?  Sig: TAKE 1 TABLET BY MOUTH TWICE A DAY  ?  ? Gastroenterology: Proton Pump Inhibitors Passed - 07/27/2021 11:09 AM  ?  ?  Passed - Valid encounter within last 12 months  ?  Recent Outpatient Visits   ?      ? 1 month ago Feeling jittery  ? [facility] [place], [place] W, NP  ? 3 months ago Right-sided chest pain  ? [facility] [place], [place] W, NP  ? 3 months ago Acute non-recurrent maxillary sinusitis  ? [facility] [place], Coralie Keens, NP  ? 5 months ago Encounter for general adult medical examination with abnormal findings  ? [facility] [place], Coralie Keens, NP  ?  ?  ? ?  ?  ?  ? ?

## 2021-07-31 ENCOUNTER — Other Ambulatory Visit: Payer: Self-pay | Admitting: Internal Medicine

## 2021-07-31 DIAGNOSIS — E785 Hyperlipidemia, unspecified: Secondary | ICD-10-CM

## 2021-08-01 NOTE — Telephone Encounter (Signed)
Requested Prescriptions  ?Pending Prescriptions Disp Refills  ?? fenofibrate (TRICOR) 145 MG tablet [Pharmacy Med Name: FENOFIBRATE 145 MG TABLET] 90 tablet 0  ?  Sig: TAKE 1 TABLET BY MOUTH EVERY DAY  ?  ? Cardiovascular:  Antilipid - Fibric Acid Derivatives Failed - 07/31/2021  1:59 AM  ?  ?  Failed - ALT in normal range and within 360 days  ?  ALT  ?Date Value Ref Range Status  ?01/31/2021 30 (H) 6 - 29 U/L Final  ?   ?  ?  Failed - Lipid Panel in normal range within the last 12 months  ?  Cholesterol  ?Date Value Ref Range Status  ?01/31/2021 163 <200 mg/dL Final  ? ?LDL Cholesterol (Calc)  ?Date Value Ref Range Status  ?01/31/2021  mg/dL (calc) Final  ?  Comment:  ?  . ?LDL cholesterol not calculated. Triglyceride levels ?greater than 400 mg/dL invalidate calculated LDL results. ?. ?Reference range: <100 ?Marland Kitchen ?Desirable range <100 mg/dL for primary prevention;   ?<70 mg/dL for patients with CHD or diabetic patients  ?with > or = 2 CHD risk factors. ?. ?LDL-C is now calculated using the Martin-Hopkins  ?calculation, which is a validated novel method providing  ?better accuracy than the Friedewald equation in the  ?estimation of LDL-C.  ?Cresenciano Genre et al. Annamaria Helling. 4128;786(76): 2061-2068  ?(http://education.[facility].com/faq/FAQ164) ?  ? ?Direct LDL  ?Date Value Ref Range Status  ?12/17/2019 63.0 mg/dL Final  ?  Comment:  ?  Optimal:  <100 mg/dLNear or Above Optimal:  100-129 mg/dLBorderline High:  130-159 mg/dLHigh:  160-189 mg/dLVery High:  >190 mg/dL  ? ?HDL  ?Date Value Ref Range Status  ?01/31/2021 57 > OR = 50 mg/dL Final  ? ?Triglycerides  ?Date Value Ref Range Status  ?01/31/2021 535 (H) <150 mg/dL Final  ?  Comment:  ?  . ?If a non-fasting specimen was collected, consider ?repeat triglyceride testing on a fasting specimen ?if clinically indicated.  ?Garald Balding al. J. of Clin. Lipidol. 7209;4:709-628. ?. ?. ?There is increased risk of pancreatitis when the  ?triglyceride concentration is very high  ?(>  or = 500 mg/dL, especially if > or = 1000 mg/dL).  ?Garald Balding al. J. of Clin. Lipidol. 3662;9:476-546. ?. ?  ? ?  ?  ?  Passed - AST in normal range and within 360 days  ?  AST  ?Date Value Ref Range Status  ?01/31/2021 19 10 - 35 U/L Final  ?   ?  ?  Passed - Cr in normal range and within 360 days  ?  Creat  ?Date Value Ref Range Status  ?01/31/2021 0.73 0.50 - 1.05 mg/dL Final  ? ?Creatinine,U  ?Date Value Ref Range Status  ?12/15/2018 61.2 mg/dL Final  ? ?Creatinine, Urine  ?Date Value Ref Range Status  ?01/31/2021 110 20 - 275 mg/dL Final  ?   ?  ?  Passed - HGB in normal range and within 360 days  ?  Hemoglobin  ?Date Value Ref Range Status  ?01/31/2021 12.6 11.7 - 15.5 g/dL Final  ?   ?  ?  Passed - HCT in normal range and within 360 days  ?  HCT  ?Date Value Ref Range Status  ?01/31/2021 39.2 35.0 - 45.0 % Final  ?   ?  ?  Passed - PLT in normal range and within 360 days  ?  Platelets  ?Date Value Ref Range Status  ?01/31/2021 249 140 - 400 Thousand/uL Final  ?   ?  ?  Passed - WBC in normal range and within 360 days  ?  WBC  ?Date Value Ref Range Status  ?01/31/2021 7.0 3.8 - 10.8 Thousand/uL Final  ?   ?  ?  Passed - eGFR is 30 or above and within 360 days  ?  GFR, Est African American  ?Date Value Ref Range Status  ?09/27/2016 87 >=60 mL/min Final  ? ?GFR, Est Non African American  ?Date Value Ref Range Status  ?09/27/2016 76 >=60 mL/min Final  ? ?GFR  ?Date Value Ref Range Status  ?12/17/2019 65.14 >60.00 mL/min Final  ? ?eGFR  ?Date Value Ref Range Status  ?01/31/2021 93 > OR = 60 mL/min/1.31m Final  ?  Comment:  ?  The eGFR is based on the CKD-EPI 2021 equation. To calculate  ?the new eGFR from a previous Creatinine or Cystatin C ?result, go to https://www.kidney.org/professionals/ ?kdoqi/gfr%5Fcalculator ?  ?   ?  ?  Passed - Valid encounter within last 12 months  ?  Recent Outpatient Visits   ?      ? 1 month ago [facility]y  ? [facility] [place]  ? 3 months ago  Right[facility]sided chest pain  ? [facility]  ? 4 months ago Acute non-recurrent maxillary sinusitis  ? [facility]  ? 6 months ago Encounter for general adult medical examination with abnormal findings  ? [facility]  ?  ?  ? ?  ?  ?  ? ?

## 2021-08-02 ENCOUNTER — Other Ambulatory Visit: Payer: Self-pay | Admitting: Internal Medicine

## 2021-08-02 DIAGNOSIS — E785 Hyperlipidemia, unspecified: Secondary | ICD-10-CM

## 2021-08-02 MED ORDER — FENOFIBRATE 145 MG PO TABS: 145.0000 mg | ORAL_TABLET | Freq: Every day | ORAL | 0 refills | Status: DC

## 2021-08-02 MED ORDER — PANTOPRAZOLE SODIUM 40 MG PO TBEC: 40.0000 mg | DELAYED_RELEASE_TABLET | Freq: Two times a day (BID) | ORAL | 1 refills | Status: DC

## 2021-08-02 NOTE — Telephone Encounter (Signed)
Copied from [place] [phone]. Topic: Quick Communication - Rx Refill/Question ?>> Aug 02, 2021 10:29 AM Leward Quan A wrote: ?Medication: fenofibrate (TRICOR) 145 MG tablet, pantoprazole (PROTONIX) 40 MG tablet ?Rx sent to [facility] on 07/29/21 and 08/01/21 ?Has the patient contacted their pharmacy? Yes.  New pharmacy so no Rx there  ?(Agent: If no, request that the patient contact the pharmacy for the refill. If patient does not wish to contact the pharmacy document the reason why and proceed with request.) ?(Agent: If yes, when and what did the pharmacy advise?) ? ?Preferred Pharmacy (with phone number or street name): [facility] [place]  ?Phone:  [phone] ?Fax:  [phone] ? ? ? ?Has the patient been seen for an appointment in the last year OR does the patient have an upcoming appointment? Yes.   ? ?Agent: Please be advised that RX refills may take up to 3 business days. We ask that you follow-up with your pharmacy. ?

## 2021-08-02 NOTE — Telephone Encounter (Signed)
Requested Prescriptions  ?Pending Prescriptions Disp Refills  ?? fenofibrate (TRICOR) 145 MG tablet 90 tablet 0  ?  Sig: Take 1 tablet (145 mg total) by mouth daily.  ?  ? Cardiovascular:  Antilipid - Fibric Acid Derivatives Failed - 08/02/2021 11:19 AM  ?  ?  Failed - ALT in normal range and within 360 days  ?  ALT  ?Date Value Ref Range Status  ?01/31/2021 30 (H) 6 - 29 U/L Final  ?   ?  ?  Failed - Lipid Panel in normal range within the last 12 months  ?  Cholesterol  ?Date Value Ref Range Status  ?01/31/2021 163 <200 mg/dL Final  ? ?LDL Cholesterol (Calc)  ?Date Value Ref Range Status  ?01/31/2021  mg/dL (calc) Final  ?  Comment:  ?  . ?LDL cholesterol not calculated. Triglyceride levels ?greater than 400 mg/dL invalidate calculated LDL results. ?. ?Reference range: <100 ?Marland Kitchen ?Desirable range <100 mg/dL for primary prevention;   ?<70 mg/dL for patients with CHD or diabetic patients  ?with > or = 2 CHD risk factors. ?. ?LDL-C is now calculated using the Martin-Hopkins  ?calculation, which is a validated novel method providing  ?better accuracy than the Friedewald equation in the  ?estimation of LDL-C.  ?Cresenciano Genre et al. Annamaria Helling. 4158;309(40): 2061-2068  ?(http://education.[facility].com/faq/FAQ164) ?  ? ?Direct LDL  ?Date Value Ref Range Status  ?12/17/2019 63.0 mg/dL Final  ?  Comment:  ?  Optimal:  <100 mg/dLNear or Above Optimal:  100-129 mg/dLBorderline High:  130-159 mg/dLHigh:  160-189 mg/dLVery High:  >190 mg/dL  ? ?HDL  ?Date Value Ref Range Status  ?01/31/2021 57 > OR = 50 mg/dL Final  ? ?Triglycerides  ?Date Value Ref Range Status  ?01/31/2021 535 (H) <150 mg/dL Final  ?  Comment:  ?  . ?If a non-fasting specimen was collected, consider ?repeat triglyceride testing on a fasting specimen ?if clinically indicated.  ?Garald Balding al. J. of Clin. Lipidol. 7680;8:811-031. ?. ?. ?There is increased risk of pancreatitis when the  ?triglyceride concentration is very high  ?(> or = 500 mg/dL, especially if > or =  1000 mg/dL).  ?Garald Balding al. J. of Clin. Lipidol. 5945;8:592-924. ?. ?  ? ?  ?  ?  Passed - AST in normal range and within 360 days  ?  AST  ?Date Value Ref Range Status  ?01/31/2021 19 10 - 35 U/L Final  ?   ?  ?  Passed - Cr in normal range and within 360 days  ?  Creat  ?Date Value Ref Range Status  ?01/31/2021 0.73 0.50 - 1.05 mg/dL Final  ? ?Creatinine,U  ?Date Value Ref Range Status  ?12/15/2018 61.2 mg/dL Final  ? ?Creatinine, Urine  ?Date Value Ref Range Status  ?01/31/2021 110 20 - 275 mg/dL Final  ?   ?  ?  Passed - HGB in normal range and within 360 days  ?  Hemoglobin  ?Date Value Ref Range Status  ?01/31/2021 12.6 11.7 - 15.5 g/dL Final  ?   ?  ?  Passed - HCT in normal range and within 360 days  ?  HCT  ?Date Value Ref Range Status  ?01/31/2021 39.2 35.0 - 45.0 % Final  ?   ?  ?  Passed - PLT in normal range and within 360 days  ?  Platelets  ?Date Value Ref Range Status  ?01/31/2021 249 140 - 400 Thousand/uL Final  ?   ?  ?  Passed - WBC in normal range and within 360 days  ?  WBC  ?Date Value Ref Range Status  ?01/31/2021 7.0 3.8 - 10.8 Thousand/uL Final  ?   ?  ?  Passed - eGFR is 30 or above and within 360 days  ?  GFR, Est African American  ?Date Value Ref Range Status  ?09/27/2016 87 >=60 mL/min Final  ? ?GFR, Est Non African American  ?Date Value Ref Range Status  ?09/27/2016 76 >=60 mL/min Final  ? ?GFR  ?Date Value Ref Range Status  ?12/17/2019 65.14 >60.00 mL/min Final  ? ?eGFR  ?Date Value Ref Range Status  ?01/31/2021 93 > OR = 60 mL/min/1.77m Final  ?  Comment:  ?  The eGFR is based on the CKD-EPI 2021 equation. To calculate  ?the new eGFR from a previous Creatinine or Cystatin C ?result, go to https://www.kidney.org/professionals/ ?kdoqi/gfr%5Fcalculator ?  ?   ?  ?  Passed - Valid encounter within last 12 months  ?  Recent Outpatient Visits   ?      ? 1 month ago Feeling [facility]ittery  ? [facility] [place]  ? 3 months ago Right-sided chest pain  ? [facility]  ? 4 months ago Acute non-recurrent maxillary sinusitis  ? [facility]  ? 6 months ago Encounter for general adult medical examination with abnormal findings  ? [facility]  ?  ?  ? ?  ?  ?  ?? pantoprazole (PROTONIX) 40 MG tablet 180 tablet 1  ?  Sig: Take 1 tablet (40 mg total) by mouth 2 (two) times daily.  ?  ? Gastroenterology: Proton Pump Inhibitors Passed - 08/02/2021 11:19 AM  ?  ?  Passed - Valid encounter within last 12 months  ?  Recent Outpatient Visits   ?      ? 1 month ago Feeling jittery  ? [facility] [place]  ? 3 months ago Right-sided chest pain  ? [facility]  ? 4 months ago Acute non-recurrent maxillary sinusitis  ? [facility]  ? 6 months ago Encounter for general adult medical examination with abnormal findings  ? [facility]  ?  ?  ? ?  ?  ?  ? ?

## 2021-08-21 ENCOUNTER — Other Ambulatory Visit: Payer: Self-pay | Admitting: Internal Medicine

## 2021-08-21 ENCOUNTER — Ambulatory Visit: Payer: HMO | Admitting: Internal Medicine

## 2021-08-21 DIAGNOSIS — E785 Hyperlipidemia, unspecified: Secondary | ICD-10-CM

## 2021-08-21 NOTE — Progress Notes (Deleted)
Patient ID: [name], female   DOB: 17-Jan-1956, 66 y.o.   MRN: [id number]  ? ?This visit occurred during the SARS-CoV-2 public health emergency.  Safety protocols were in place, including screening questions prior to the visit, additional usage of staff PPE, and extensive cleaning of exam room while observing appropriate contact time as indicated for disinfecting solutions.  ? ?HPI: ?[name] is a 66 y.o.-year-old female, returning for f/u for DM2, dx in 2013, insulin-dependent since 2016, uncontrolled, without long term complications.   Last visit 3.5 months ago. ? ?Interim history: ?No increased urination, blurry vision, nausea, chest pain. ?She continues to have mild tremors. ? ?Reviewed HbA1c levels: ?Lab Results  ?Component Value Date  ? HGBA1C 8.5 (A) 05/09/2021  ? HGBA1C 8.4 (H) 01/31/2021  ? HGBA1C 12.1 (A) 09/26/2020  ? HGBA1C 11.4 (A) 05/09/2020  ? HGBA1C 10.1 (A) 02/04/2020  ? HGBA1C 6.9 (A) 09/29/2019  ? HGBA1C 8.6 (H) 07/02/2019  ? HGBA1C 8.3 (A) 05/29/2019  ? HGBA1C 8.7 (H) 12/15/2018  ? HGBA1C 9.6 (A) 10/16/2018  ? HGBA1C 11.2 (A) 07/18/2018  ? HGBA1C 8.6 (H) 12/09/2017  ? HGBA1C 8.2 06/25/2017  ? HGBA1C 7.8 01/16/2017  ? HGBA1C 8.9 09/27/2016  ? HGBA1C 9.8 (H) 07/05/2016  ? HGBA1C 10.4 (H) 03/28/2016  ? HGBA1C 8.4 (H) 12/27/2015  ? HGBA1C 9.0 (H) 06/29/2015  ? HGBA1C 10.0 (H) 03/24/2015  ? HGBA1C 8.8 (H) 04/19/2014  ? HGBA1C 11.6 (H) 01/07/2014  ? HGBA1C 12.2 (H) 10/06/2013  ? HGBA1C 9.4 (H) 04/27/2013  ? ?Pt is on a regimen of: ?- Metformin  1000 >> 500 mg >> 1000 2x a day, with meals ?- Glipizide 10 mg 20-30 minutes before breakfast and dinner-restarted 10/2018  (forgot) ?- Trulicity 1.5 mg weekly -restarted 09/2020 - decreased appetite, no more nausea >> 3 mg weekly ?- >> Basaglar 44 units daily ?- NovoLog 8 units before B and 10 units before D >> 10-15 units before meals ?She had mm cramps from Invokana. We tried to start Farxiga 10 mg before b'fast >> did not get it from pharmacy. ?She was  previously on Trulicity, but stopped due to cost and also nausea.  We tried Ozempic but she again had problems with constant nausea.  In 09/2020, I again suggested to try a low-dose Trulicity. ?  ?Pt checks her sugars 2-4 times a day: ?- am: 150-330 >> 175-220 >> 90x, 112-131 >> 96-130, 140 ?- 2h after b'fast: 138-165 >> 200s >> n/c >> 139-147 >> n/c ?- before lunch: 150-250 >> 200s  >> n/c ?- 2h after lunch: <150 >> n/c >> 150-170 >> n/c>> 140-150s, 160 ?- before dinner: 150-250 >> 215-350 >> n/c ?- 2h after dinner: 200-280 >> 197 on Ozempic >> n/c ?- bedtime: n/c >> 179  >> n/c >> 161-181 >> 175-196 ?- nighttime: n/c >> 212 ?Lowest sugar was 126 >> 175 >> 90s>> 96; it is unclear at which ever she has hypoglycemia awareness. ?Highest sugar was 330 >> 345 >> 212 >> 196. ? ?Glucometer: OneTouch Ultra >> One Touch Verio Flex ? ?Pt's meals are: ?- Breakfast: grits, meat or cereals ?- Lunch: sandwich or bowl of cereal ?- Dinner: 2-3x a week eats out: chicken, steak, ribs ?- Snacks: , fruit -watermelon ?No sodas.  She drinks water with Crystal light ? ?-No CKD, last BUN/creatinine:  ?Lab Results  ?Component Value Date  ? BUN 14 01/31/2021  ? BUN 17 12/17/2019  ? CREATININE 0.73 01/31/2021  ? CREATININE 0.88 12/17/2019  ? ?-+  HL; last set of lipids: ?Lab Results  ?Component Value Date  ? CHOL 163 01/31/2021  ? HDL 57 01/31/2021  ? [place]  01/31/2021  ?   Comment:  ?   . ?LDL cholesterol not calculated. Triglyceride levels ?greater than 400 mg/dL invalidate calculated LDL results. ?. ?Reference range: <100 ?Marland Kitchen ?Desirable range <100 mg/dL for primary prevention;   ?<70 mg/dL for patients with CHD or diabetic patients  ?with > or = 2 CHD risk factors. ?. ?LDL-C is now calculated using the Martin-Hopkins  ?calculation, which is a validated novel method providing  ?better accuracy than the Friedewald equation in the  ?estimation of LDL-C.  ?Cresenciano Genre et al. Annamaria Helling. 3474;259(56): 2061-2068   ?(http://education.[facility].com/faq/FAQ164) ?  ? LDLDIRECT 63.0 12/17/2019  ? TRIG 535 (H) 01/31/2021  ? CHOLHDL 2.9 01/31/2021  ?On Crestor, fenofibrate, fish oil ? ?- last eye exam was 03/07/2021: No DR reportedly. ? ?- no numbness and tingling in her feet.  Latest foot exam: 01/2021. ? ?She had vertigo >> seeing specialist at [facility] >> changed from meclizine to Valium.  She has a microphone in the right ear which transmits to the left.  ? ?Her husband, Natonya Finstad, was also my patient - he had terminal liver cancer-died 2019-06-23. ? ?ROS: ?+ see HPI ?+ tremors in thumbs ? ?I reviewed pt's medications, allergies, PMH, social hx, family hx, and changes were documented in the history of present illness. Otherwise, unchanged from my initial visit note. ? ?Past Medical History:  ?Diagnosis Date  ? Blood transfusion   ? 1984   [place]  ? Chronic kidney disease   ? occasional renal stone  ? Complication of anesthesia   ? woke during surgery (ablation) 22-Jun-2004?  ? Diabetes mellitus   ? type 2 Diab- A1C-7  ? GERD (gastroesophageal reflux disease)   ? occasional Protonix-will take protonix night before surgery  ? Hypercholesteremia   ? on meds  ? ?Past Surgical History:  ?Procedure Laterality Date  ? BLADDER SUSPENSION  12/08/2010  ? Procedure: TRANSVAGINAL TAPE (TVT) PROCEDURE;  Surgeon: Arloa Koh;  Location: [place] [facility];  Service: Gynecology;  Laterality: N/A;  transvaginal tape with cystoscopy  ? BLADDER SUSPENSION  01/30/2011  ? Procedure: TRANSVAGINAL TAPE (TVT) PROCEDURE;  Surgeon: Arloa Koh;  Location: [place] [facility];  Service: Gynecology;  Laterality: N/A;  excision of excess sling from suprapubic incision and cystoscopy  ? [place] SURGERY  2002-06-22  ? CYSTOSCOPY  01/30/2011  ? Procedure: CYSTOSCOPY;  Surgeon: Arloa Koh;  Location: [facility] [facility];  Service: Gynecology;  Laterality: N/A;  ? TONSILLECTOMY  1976  ? transvaginal tape  8/12  ? uterine ablation    ? ?Social History  ? ?Social History  ? Marital status: Married  ?   Spouse name: N/A  ? Number of children: 2  ? ?Occupational History  ? retired  ? ?Social History Main Topics  ? Smoking status: Former Smoker  ?  Packs/day: 0.75  ?  Years: 30.00  ?  Types: Cigarettes  ? Smokeless tobacco: Never Used  ?   Comment: recently quit 03/01/2015  ? Alcohol use No  ? Drug use: No  ? ?Current Outpatient Medications on File Prior to Visit  ?Medication Sig Dispense Refill  ? amoxicillin-clavulanate (AUGMENTIN) 875-125 MG tablet Take 1 tablet by mouth 2 (two) times daily. 20 tablet 0  ? B-D UF III MINI PEN NEEDLES 31G X 5 MM MISC Use 4x a day 200 each 3  ? Blood Glucose  Monitoring Suppl (ONETOUCH VERIO) w/Device KIT Use as instructed to check blood sugar 3X daily 1 kit 0  ? Calcium Carbonate-Vitamin D (CALTRATE 600+D PO) Take by mouth.    ? Continuous Blood Gluc Receiver (FREESTYLE LIBRE 2 READER) DEVI 1 each by Does not apply route daily. 1 each 0  ? Continuous Blood Gluc Sensor (FREESTYLE LIBRE 2 SENSOR) MISC 1 each by Does not apply route every 14 (fourteen) days. 6 each 3  ? Dulaglutide (TRULICITY) 3 KA/7.6OT SOPN Inject 3 mg into the skin once a week. 6 mL 3  ? fenofibrate (TRICOR) 145 MG tablet Take 1 tablet (145 mg total) by mouth daily. 90 tablet 0  ? glipiZIDE (GLUCOTROL) 10 MG tablet Take 0.5 tablets (5 mg total) by mouth 2 (two) times daily before a meal. 90 tablet 3  ? glucose blood (ONETOUCH VERIO) test strip Use as instructed to check blood sugar 3X daily 300 each 3  ? ibuprofen (ADVIL) 200 MG tablet Take 200 mg by mouth every 6 (six) hours as needed.    ? Insulin Glargine (BASAGLAR KWIKPEN) 100 UNIT/ML Inject 44 Units into the skin at bedtime. 45 mL 3  ? metFORMIN (GLUCOPHAGE) 1000 MG tablet Take 1 tablet (1,000 mg total) by mouth 2 (two) times daily with a meal. 180 tablet 2  ? Multiple Vitamins-Minerals (MULTIVITAMINS THER. W/MINERALS) TABS Take 1 tablet by mouth daily.    ? Omega-3 Fatty Acids (FISH OIL) 500 MG CAPS Take 500 mg by mouth daily.    ? OVER THE COUNTER MEDICATION  Take 1 tablet by mouth 2 (two) times daily. PATIENT TAKES CINNAMON TABLETS 102m    ? pantoprazole (PROTONIX) 40 MG tablet Take 1 tablet (40 mg total) by mouth 2 (two) times daily. 180 tablet 1  ? rosuvastatin (CRESTOR) 10 MG tablet TAK

## 2021-08-21 NOTE — Telephone Encounter (Signed)
Requested medication (s) are due for refill today: yes ? ?Requested medication (s) are on the active medication list: yes ? ?Last refill:  02/24/21 #90/1 ? ?Future visit scheduled: no ? ?Notes to clinic:  Unable to refill per protocol, medication not assigned to the refill protocol. ? ?  ?Requested Prescriptions  ?Pending Prescriptions Disp Refills  ? rosuvastatin (CRESTOR) 10 MG tablet [Pharmacy Med Name: ROSUVASTATIN CALCIUM 10 MG TAB] 90 tablet 1  ?  Sig: TAKE 1 TABLET BY MOUTH EVERY DAY  ?  ? There is no refill protocol information for this order  ?  ? ?

## 2021-09-30 ENCOUNTER — Other Ambulatory Visit: Payer: Self-pay | Admitting: Internal Medicine

## 2021-09-30 DIAGNOSIS — E1165 Type 2 diabetes mellitus with hyperglycemia: Secondary | ICD-10-CM

## 2021-10-09 ENCOUNTER — Other Ambulatory Visit: Payer: Self-pay

## 2021-10-09 MED ORDER — BASAGLAR KWIKPEN 100 UNIT/ML ~~LOC~~ SOPN: 44.0000 [IU] | PEN_INJECTOR | Freq: Every day | SUBCUTANEOUS | 0 refills | Status: DC

## 2021-10-10 ENCOUNTER — Telehealth: Payer: Self-pay

## 2021-10-10 ENCOUNTER — Other Ambulatory Visit (HOSPITAL_COMMUNITY): Payer: Self-pay

## 2021-10-10 NOTE — Telephone Encounter (Signed)
Patient Advocate Encounter   Received notification that prior authorization for Insulin Glargine Solostar 100UNIT/ML pen-injectors is required.   PA submitted on 10/10/2021 Key [id number] Status is pending

## 2021-10-11 ENCOUNTER — Other Ambulatory Visit (HOSPITAL_COMMUNITY): Payer: Self-pay

## 2021-10-11 DIAGNOSIS — Z1231 Encounter for screening mammogram for malignant neoplasm of breast: Secondary | ICD-10-CM | POA: Diagnosis not present

## 2021-10-11 LAB — HM MAMMOGRAPHY

## 2021-10-12 ENCOUNTER — Other Ambulatory Visit (HOSPITAL_COMMUNITY): Payer: Self-pay

## 2021-10-19 ENCOUNTER — Other Ambulatory Visit (HOSPITAL_COMMUNITY): Payer: Self-pay

## 2021-10-19 NOTE — Telephone Encounter (Signed)
Patient Advocate Encounter  Prior Authorization for Insulin Glargine Solostar 100UNIT/ML has been approved.    PA#  [id number] Effective dates: 10/10/2021 through 05/06/2022

## 2021-10-26 ENCOUNTER — Other Ambulatory Visit: Payer: Self-pay | Admitting: Internal Medicine

## 2021-10-26 DIAGNOSIS — E785 Hyperlipidemia, unspecified: Secondary | ICD-10-CM

## 2021-10-26 NOTE — Telephone Encounter (Signed)
Requested Prescriptions  Pending Prescriptions Disp Refills  . fenofibrate (TRICOR) 145 MG tablet [Pharmacy Med Name: FENOFIBRATE 145 MG TAB] 90 tablet 0    Sig: TAKE 1 TABLET BY MOUTH ONCE A DAY     Cardiovascular:  Antilipid - Fibric Acid Derivatives Failed - 10/26/2021 10:19 AM      Failed - ALT in normal range and within 360 days    ALT  Date Value Ref Range Status  01/31/2021 30 (H) 6 - 29 U/L Final         Failed - Lipid Panel in normal range within the last 12 months    Cholesterol  Date Value Ref Range Status  01/31/2021 163 <200 mg/dL Final   LDL Cholesterol (Calc)  Date Value Ref Range Status  01/31/2021  mg/dL (calc) Final    Comment:    . LDL cholesterol not calculated. Triglyceride levels greater than 400 mg/dL invalidate calculated LDL results. . Reference range: <100 . Desirable range <100 mg/dL for primary prevention;   <70 mg/dL for patients with CHD or diabetic patients  with > or = 2 CHD risk factors. Marland Kitchen LDL-C is now calculated using the Martin-Hopkins  calculation, which is a validated novel method providing  better accuracy than the Friedewald equation in the  estimation of LDL-C.  Cresenciano Genre et al. Annamaria Helling. 1610;960(45): 2061-2068  (http://education.[facility].com/faq/FAQ164)    Direct LDL  Date Value Ref Range Status  12/17/2019 63.0 mg/dL Final    Comment:    Optimal:  <100 mg/dLNear or Above Optimal:  100-129 mg/dLBorderline High:  130-159 mg/dLHigh:  160-189 mg/dLVery High:  >190 mg/dL   HDL  Date Value Ref Range Status  01/31/2021 57 > OR = 50 mg/dL Final   Triglycerides  Date Value Ref Range Status  01/31/2021 535 (H) <150 mg/dL Final    Comment:    . If a non-fasting specimen was collected, consider repeat triglyceride testing on a fasting specimen if clinically indicated.  Yates Decamp et al. J. of Clin. Lipidol. 4098;1:191-478. . . There is increased risk of pancreatitis when the  triglyceride concentration is very high  (> or  = 500 mg/dL, especially if > or = 1000 mg/dL).  Yates Decamp et al. J. of Clin. Lipidol. 2956;2:130-865. Marland Kitchen          Passed - AST in normal range and within 360 days    AST  Date Value Ref Range Status  01/31/2021 19 10 - 35 U/L Final         Passed - Cr in normal range and within 360 days    Creat  Date Value Ref Range Status  01/31/2021 0.73 0.50 - 1.05 mg/dL Final   Creatinine,U  Date Value Ref Range Status  12/15/2018 61.2 mg/dL Final   Creatinine, Urine  Date Value Ref Range Status  01/31/2021 110 20 - 275 mg/dL Final         Passed - HGB in normal range and within 360 days    Hemoglobin  Date Value Ref Range Status  01/31/2021 12.6 11.7 - 15.5 g/dL Final         Passed - HCT in normal range and within 360 days    HCT  Date Value Ref Range Status  01/31/2021 39.2 35.0 - 45.0 % Final         Passed - PLT in normal range and within 360 days    Platelets  Date Value Ref Range Status  01/31/2021 249 140 - 400 Thousand/uL Final  Passed - WBC in normal range and within 360 days    WBC  Date Value Ref Range Status  01/31/2021 7.0 3.8 - 10.8 Thousand/uL Final         Passed - eGFR is 30 or above and within 360 days    GFR, Est African American  Date Value Ref Range Status  09/27/2016 87 >=60 mL/min Final   GFR, Est Non African American  Date Value Ref Range Status  09/27/2016 76 >=60 mL/min Final   GFR  Date Value Ref Range Status  12/17/2019 65.14 >60.00 mL/min Final   eGFR  Date Value Ref Range Status  01/31/2021 93 > OR = 60 mL/min/1.35m Final    Comment:    The eGFR is based on the CKD-EPI 2021 equation. To calculate  the new eGFR from a previous Creatinine or Cystatin C result, go to https://www.kidney.org/professionals/ kdoqi/gfr%5Fcalculator          Passed - Valid encounter within last 12 months    Recent Outpatient Visits          4 months ago [facility] [facility]   6 months ago  Right-sided chest [facility]   6 months ago [facility]-recurrent [facility]illary [facility]   8 months ago Encounter for general adult medical examination with abnormal findings   [facility]      Future Appointments            In 3 months Ba[place]

## 2021-11-14 DIAGNOSIS — E119 Type 2 diabetes mellitus without complications: Secondary | ICD-10-CM | POA: Diagnosis not present

## 2021-11-14 LAB — HM DIABETES EYE EXAM

## 2021-12-29 ENCOUNTER — Ambulatory Visit (INDEPENDENT_AMBULATORY_CARE_PROVIDER_SITE_OTHER): Payer: HMO | Admitting: Physician Assistant

## 2021-12-29 ENCOUNTER — Ambulatory Visit: Payer: Self-pay

## 2021-12-29 ENCOUNTER — Encounter: Payer: Self-pay | Admitting: Physician Assistant

## 2021-12-29 VITALS — BP 133/76 | HR 90 | Ht 65.0 in | Wt 174.0 lb

## 2021-12-29 DIAGNOSIS — Z6831 Body mass index (BMI) 31.0-31.9, adult: Secondary | ICD-10-CM

## 2021-12-29 DIAGNOSIS — E6609 Other obesity due to excess calories: Secondary | ICD-10-CM

## 2021-12-29 DIAGNOSIS — R251 Tremor, unspecified: Secondary | ICD-10-CM

## 2021-12-29 DIAGNOSIS — Z794 Long term (current) use of insulin: Secondary | ICD-10-CM

## 2021-12-29 DIAGNOSIS — E1165 Type 2 diabetes mellitus with hyperglycemia: Secondary | ICD-10-CM

## 2021-12-29 MED ORDER — METFORMIN HCL 1000 MG PO TABS: 1000.0000 mg | ORAL_TABLET | Freq: Two times a day (BID) | ORAL | 1 refills | Status: DC

## 2021-12-29 NOTE — Patient Instructions (Addendum)
I would like you to restart your Metformin and the Lantus as directed  Keep taking the Glipizide- try to get up to 10 mg per day   Please come back and meet with your PCP in 6 weeks to discuss CCM and diabetes medications

## 2021-12-29 NOTE — Progress Notes (Signed)
Acute Office Visit   Patient: [name]   DOB: Sep 14, 1955   66 y.o. Female  MRN: [id number] Visit Date: 12/29/2021  Today's healthcare provider: Dani Gobble Zanyah Lentsch, PA-C  Introduced myself to the patient as a Journalist, newspaper and provided education on APPs in clinical practice.    Chief Complaint  Patient presents with   Headache   Epistaxis   Subjective    HPI   Headache Has noticed for the past   Nosebleeds States she woke up this AM with blood all over her pillow  Reports she has had these in the past when her blood glucose is elevated  States she has stopped the bleeding at this time    Elevated blood glucose Reports she has had elevated blood glucose levels Reports she has stopped all her diabetes medications other than half of a glipizide -reports feeling bad on glipizide  Reports her glucose this AM was 363  Has been trying to take Berberine for this, twice per day for the past week She took her Trulicity last week (Sunday) reports skepticism that it is helping her DM She Stopped her Lantus Monday night,but took 30 units before she came to apt due to fear over high glucose readings  She stopped taking Metformin because she is afraid it is causing some shaking in her hands She has stopped taking her statin as well   She states she is having trouble affording her Trulicity     Medications: Outpatient Medications Prior to Visit  Medication Sig   amoxicillin-clavulanate (AUGMENTIN) 875-125 MG tablet Take 1 tablet by mouth 2 (two) times daily.   B-D UF III MINI PEN NEEDLES 31G X 5 MM MISC Use 4x a day   Blood Glucose Monitoring Suppl (ONETOUCH VERIO) w/Device KIT Use as instructed to check blood sugar 3X daily   Calcium Carbonate-Vitamin D (CALTRATE 600+D PO) Take by mouth.   Continuous Blood Gluc Receiver (FREESTYLE LIBRE 2 READER) DEVI 1 each by Does not apply route daily.   Continuous Blood Gluc Sensor (FREESTYLE LIBRE 2 SENSOR) MISC 1 each by Does not apply route  every 14 (fourteen) days.   Dulaglutide (TRULICITY) 3 IN/8.6VE SOPN Inject 3 mg into the skin once a week.   fenofibrate (TRICOR) 145 MG tablet TAKE 1 TABLET BY MOUTH ONCE A DAY   glipiZIDE (GLUCOTROL) 10 MG tablet TAKE 1 TABLET (10 MG TOTAL) BY MOUTH 2 (TWO) TIMES DAILY BEFORE A MEAL.   glucose blood (ONETOUCH VERIO) test strip Use as instructed to check blood sugar 3X daily   ibuprofen (ADVIL) 200 MG tablet Take 200 mg by mouth every 6 (six) hours as needed.   Insulin Glargine (BASAGLAR KWIKPEN) 100 UNIT/ML Inject 44 Units into the skin at bedtime.   metFORMIN (GLUCOPHAGE) 1000 MG tablet Take 1 tablet (1,000 mg total) by mouth 2 (two) times daily with a meal.   Multiple Vitamins-Minerals (MULTIVITAMINS THER. W/MINERALS) TABS Take 1 tablet by mouth daily.   Omega-3 Fatty Acids (FISH OIL) 500 MG CAPS Take 500 mg by mouth daily.   OVER THE COUNTER MEDICATION Take 1 tablet by mouth 2 (two) times daily. PATIENT TAKES CINNAMON TABLETS 1043m   pantoprazole (PROTONIX) 40 MG tablet Take 1 tablet (40 mg total) by mouth 2 (two) times daily.   rosuvastatin (CRESTOR) 10 MG tablet TAKE 1 TABLET BY MOUTH EVERY DAY   sertraline (ZOLOFT) 25 MG tablet TAKE 1 TABLET (25 MG TOTAL) BY MOUTH DAILY.  No facility-administered medications prior to visit.    Review of Systems  Constitutional:  Negative for fever.  HENT:  Positive for nosebleeds. Negative for congestion, rhinorrhea, sinus pressure, sinus pain and sore throat.   Respiratory:  Negative for shortness of breath and wheezing.   Neurological:  Positive for tremors and headaches. Negative for numbness.  Psychiatric/Behavioral:  The patient is nervous/anxious.        Objective    BP 133/76   Pulse 90   Ht '5\' 5"'  (1.651 m)   Wt 174 lb (78.9 kg)   SpO2 98%   BMI 28.96 kg/m    Physical Exam Vitals reviewed.  Constitutional:      General: She is awake.     Appearance: Normal appearance. She is well-developed, well-groomed and overweight.   HENT:     Head: Normocephalic and atraumatic.     Nose: No nasal deformity, septal deviation, congestion or rhinorrhea.     Right Nostril: Septal hematoma present. No occlusion.     Left Nostril: No septal hematoma or occlusion.     Right Turbinates: Swollen. Not enlarged or pale.     Left Turbinates: Swollen. Not enlarged or pale.     Mouth/Throat:     Lips: Pink.     Mouth: Mucous membranes are moist.  Pulmonary:     Effort: Pulmonary effort is normal.  Neurological:     Mental Status: She is alert and oriented to person, place, and time.     GCS: GCS eye subscore is 4. GCS verbal subscore is 5. GCS motor subscore is 6.     Cranial Nerves: Cranial nerves 2-12 are intact. No dysarthria or facial asymmetry.     Motor: No tremor, atrophy or abnormal muscle tone.     Gait: Gait is intact.  Psychiatric:        Attention and Perception: Attention and perception normal.        Mood and Affect: Mood and affect normal.        Speech: Speech normal.        Behavior: Behavior normal. Behavior is cooperative.       No results found for any visits on 12/29/21.  Assessment & Plan      No follow-ups on file.     Problem List Items Addressed This Visit       Endocrine   Type 2 diabetes mellitus with hyperglycemia, with long-term current use of insulin (HCC) - Primary    Chronic, historic condition Not currently taking any medications other than Glipizide and Berberine supplement Recommend she resume Metformin 1000 mg PO BID and Lantus - dose to be dictated by labs  She is having difficulty paying for Trulicity and is skeptical of effect on her DM- will wait to restart this and see if she can be enrolled in CCM for assistance Will recheck A1c today- results to dictate further management Will refer to diabetes education services to assist with nutrition and understanding of DM disease process Recommend follow up in 6 weeks with PCP to discuss CCM and DM monitoring       Relevant  Medications   metFORMIN (GLUCOPHAGE) 1000 MG tablet   Other Relevant Orders   COMPLETE METABOLIC PANEL WITH GFR   CBC w/Diff/Platelet   HgB A1c   Referral to Nutrition and Diabetes Services     Other   Class 1 obesity due to excess calories with serious comorbidity and body mass index (BMI) of 31.0 to 31.9 in adult  Relevant Medications   metFORMIN (GLUCOPHAGE) 1000 MG tablet   Other Relevant Orders   Referral to Nutrition and Diabetes Services   Occasional tremors    Acute, new concern, transient and not observed today in office Will check B12 due to use of Metformin for rule out  If continues or becomes worse, will refer to Neuro for eval  Follow up as needed       Relevant Orders   CBC w/Diff/Platelet   B12   TSH     Return in about 4 weeks (around 01/26/2022) for Diabetes follow up.   I, Tamu Golz E Jeslin Bazinet, PA-C, have reviewed all documentation for this visit. The documentation on 12/29/21 for the exam, diagnosis, procedures, and orders are all accurate and complete.   Talitha Givens, MHS, PA-C [facility]

## 2021-12-29 NOTE — Telephone Encounter (Signed)
    Chief Complaint: High blood glucose 345 today, can't  Symptoms: Headaches, nose bleeds Frequency: This week, trying Berberine Pertinent Negatives: Patient denies  Disposition: '[]'$ ED /'[]'$ Urgent Care (no appt availability in office) / '[x]'$ Appointment(In office/virtual)/ '[]'$  [place] [facility]/ '[]'$ Home Care/ '[]'$ Refused Recommended Disposition /'[]'$ [place] [facility]/ '[]'$  Follow-up with PCP Additional Notes: Go to ED for worsening of symptoms.  Reason for Disposition  Blood glucose > 400 mg/dL (22.2 mmol/L)  Answer Assessment - Initial Assessment Questions 1. BLOOD GLUCOSE: "What is your blood glucose level?"      Over 300 - 345 today 2. ONSET: "When did you check the blood glucose?"     Today 3. USUAL RANGE: "What is your glucose level usually?" (e.g., usual fasting morning value, usual evening value)     140-180 4. KETONES: "Do you check for ketones (urine or blood test strips)?" If Yes, ask: "What does the test show now?"      No 5. TYPE 1 or 2:  "Do you know what type of diabetes you have?"  (e.g., Type 1, Type 2, Gestational; doesn't know)      Type 1 6. INSULIN: "Do you take insulin?" "What type of insulin(s) do you use? What is the mode of delivery? (syringe, pen; injection or pump)?"      Yes - has not been taking it 7. DIABETES PILLS: "Do you take any pills for your diabetes?" If Yes, ask: "Have you missed taking any pills recently?"     Yes 8. OTHER SYMPTOMS: "Do you have any symptoms?" (e.g., fever, frequent urination, difficulty breathing, dizziness, weakness, vomiting)     Headaches, nose bleed 9. PREGNANCY: "Is there any chance you are pregnant?" "When was your last menstrual period?"     No  Protocols used: Diabetes - High Blood Sugar-A-AH

## 2021-12-29 NOTE — Assessment & Plan Note (Signed)
Acute, new concern, transient and not observed today in office Will check B12 due to use of Metformin for rule out  If continues or becomes worse, will refer to Neuro for eval  Follow up as needed

## 2021-12-29 NOTE — Assessment & Plan Note (Signed)
Chronic, historic condition Not currently taking any medications other than Glipizide and Berberine supplement Recommend she resume Metformin 1000 mg PO BID and Lantus - dose to be dictated by labs  She is having difficulty paying for Trulicity and is skeptical of effect on her DM- will wait to restart this and see if she can be enrolled in CCM for assistance Will recheck A1c today- results to dictate further management Will refer to diabetes education services to assist with nutrition and understanding of DM disease process Recommend follow up in 6 weeks with PCP to discuss CCM and DM monitoring

## 2021-12-30 LAB — COMPLETE METABOLIC PANEL WITH GFR
AG Ratio: 1.7 (calc) (ref 1.0–2.5)
ALT: 18 U/L (ref 6–29)
AST: 12 U/L (ref 10–35)
Albumin: 4.5 g/dL (ref 3.6–5.1)
Alkaline phosphatase (APISO): 107 U/L (ref 37–153)
BUN: 23 mg/dL (ref 7–25)
CO2: 23 mmol/L (ref 20–32)
Calcium: 9.8 mg/dL (ref 8.6–10.4)
Chloride: 99 mmol/L (ref 98–110)
Creat: 0.94 mg/dL (ref 0.50–1.05)
Globulin: 2.7 g/dL (calc) (ref 1.9–3.7)
Glucose, Bld: 248 mg/dL — ABNORMAL HIGH (ref 65–99)
Potassium: 4.4 mmol/L (ref 3.5–5.3)
Sodium: 136 mmol/L (ref 135–146)
Total Bilirubin: 0.4 mg/dL (ref 0.2–1.2)
Total Protein: 7.2 g/dL (ref 6.1–8.1)
eGFR: 68 mL/min/{1.73_m2} (ref >=60)

## 2021-12-30 LAB — CBC WITH DIFFERENTIAL/PLATELET
Absolute Monocytes: 519 cells/uL (ref 200–950)
Basophils Absolute: 64 cells/uL (ref 0–200)
Basophils Relative: 0.7 %
Eosinophils Absolute: 0 cells/uL — ABNORMAL LOW (ref 15–500)
Eosinophils Relative: 0 %
HCT: 40.1 % (ref 35.0–45.0)
Hemoglobin: 13.4 g/dL (ref 11.7–15.5)
Lymphs Abs: 3576 cells/uL (ref 850–3900)
MCH: 28.8 pg (ref 27.0–33.0)
MCHC: 33.4 g/dL (ref 32.0–36.0)
MCV: 86.2 fL (ref 80.0–100.0)
MPV: 12.1 fL (ref 7.5–12.5)
Monocytes Relative: 5.7 %
Neutro Abs: 4941 cells/uL (ref 1500–7800)
Neutrophils Relative %: 54.3 %
Platelets: 330 10*3/uL (ref 140–400)
RBC: 4.65 10*6/uL (ref 3.80–5.10)
RDW: 12.1 % (ref 11.0–15.0)
Total Lymphocyte: 39.3 %
WBC: 9.1 10*3/uL (ref 3.8–10.8)

## 2021-12-30 LAB — TSH: TSH: 0.88 mIU/L (ref 0.40–4.50)

## 2021-12-30 LAB — HEMOGLOBIN A1C
Hgb A1c MFr Bld: 11.6 % of total Hgb — ABNORMAL HIGH (ref <5.7)
Mean Plasma Glucose: 286 mg/dL
eAG (mmol/L): 15.9 mmol/L

## 2021-12-30 LAB — VITAMIN B12: Vitamin B-12: 565 pg/mL (ref 200–1100)

## 2022-01-04 DIAGNOSIS — L821 Other seborrheic keratosis: Secondary | ICD-10-CM | POA: Diagnosis not present

## 2022-01-04 DIAGNOSIS — L820 Inflamed seborrheic keratosis: Secondary | ICD-10-CM | POA: Diagnosis not present

## 2022-01-04 DIAGNOSIS — Z1283 Encounter for screening for malignant neoplasm of skin: Secondary | ICD-10-CM | POA: Diagnosis not present

## 2022-01-12 ENCOUNTER — Encounter: Payer: Self-pay | Admitting: *Deleted

## 2022-01-12 ENCOUNTER — Encounter: Payer: HMO | Attending: Physician Assistant | Admitting: *Deleted

## 2022-01-12 VITALS — BP 130/80 | Ht 65.0 in | Wt 179.8 lb

## 2022-01-12 DIAGNOSIS — Z6831 Body mass index (BMI) 31.0-31.9, adult: Secondary | ICD-10-CM | POA: Insufficient documentation

## 2022-01-12 DIAGNOSIS — Z794 Long term (current) use of insulin: Secondary | ICD-10-CM | POA: Diagnosis not present

## 2022-01-12 DIAGNOSIS — E6609 Other obesity due to excess calories: Secondary | ICD-10-CM | POA: Diagnosis not present

## 2022-01-12 DIAGNOSIS — Z713 Dietary counseling and surveillance: Secondary | ICD-10-CM | POA: Insufficient documentation

## 2022-01-12 DIAGNOSIS — E1165 Type 2 diabetes mellitus with hyperglycemia: Secondary | ICD-10-CM | POA: Diagnosis not present

## 2022-01-12 NOTE — Progress Notes (Signed)
Diabetes Self-Management Education  Visit Type: First/Initial  Appt. Start Time: 0835 Appt. End Time: 9798  01/12/2022  [name], identified by name and date of birth, is a 66 y.o. female with a diagnosis of Diabetes: Type 2.   ASSESSMENT  Blood pressure 130/80, height '5\' 5"'$  (1.651 m), weight 179 lb 12.8 oz (81.6 kg). Body mass index is 29.92 kg/m.   Diabetes Self-Management Education - 01/12/22 1014       Visit Information   Visit Type First/Initial      Initial Visit   Diabetes Type Type 2    Date Diagnosed 5 years    Are you currently following a meal plan? No   "very picky eater"   Are you taking your medications as prescribed? Yes   had stopped taking her diabetes medications but has since resumed due to elevated A1C     Health Coping   How would you rate your overall health? Good      Psychosocial Assessment   Patient Belief/Attitude about Diabetes Other (comment)   "crap"   What is the hardest part about your diabetes right now, causing you the most concern, or is the most worrisome to you about your diabetes?   Making healty food and beverage choices    Self-care barriers None    Self-management support Doctor's office;Family    Patient Concerns Nutrition/Meal planning;Glycemic Control;Medication;Healthy Lifestyle    Special Needs None    Preferred Learning Style Other (comment)   talking/discussion   Learning Readiness Ready    How often do you need to have someone help you when you read instructions, pamphlets, or other written materials from your doctor or pharmacy? 1 - Never    What is the last grade level you completed in school? 2 years technical school      Pre-Education Assessment   Patient understands the diabetes disease and treatment process. Needs Review    Patient understands incorporating nutritional management into lifestyle. Needs Instruction    Patient undertands incorporating physical activity into lifestyle. Needs Instruction    Patient  understands using medications safely. Needs Review    Patient understands monitoring blood glucose, interpreting and using results Comprehends key points    Patient understands prevention, detection, and treatment of acute complications. Needs Instruction    Patient understands prevention, detection, and treatment of chronic complications. Needs Review    Patient understands how to develop strategies to address psychosocial issues. Needs Instruction    Patient understands how to develop strategies to promote health/change behavior. Needs Instruction      Complications   Last HgB A1C per patient/outside source 11.6 %   12/29/2021   How often do you check your blood sugar? 1-2 times/day    Fasting Blood glucose range (mg/dL) 70-129;130-179   FBG's 113-157 mg/dL   Postprandial Blood glucose range (mg/dL) 180-200;>200   pp's 180-250's mg/dL with reading of 300 mg/dL when she wasn't taking her insulin   Number of hypoglycemic episodes per month 0    Have you had a dilated eye exam in the past 12 months? Yes    Have you had a dental exam in the past 12 months? Yes    Are you checking your feet? Yes    How many days per week are you checking your feet? 7      Dietary Intake   Breakfast 3 boiled egg and 2 pieces of toast    Snack (morning) reports 2-3 snacks/day - pork rinds, peanuts, fruit (watermelon,  grapes, oranges, apples)    Lunch skips    Dinner pork rib, chicken, fish, steak; turnip greens; corn pintos, rarely rice, pasta or potatoes; raw vegetables - broccoli, cauliflower, salads with mostly protein (ham, bacon, cheese, egg)    Beverage(s) water with Crystal Ligth      Activity / Exercise   Activity / Exercise Type ADL's      Patient Education   Previous Diabetes Education No    Disease Pathophysiology Definition of diabetes, type 1 and 2, and the diagnosis of diabetes;Factors that contribute to the development of diabetes;Explored patient's options for treatment of their diabetes     Healthy Eating Role of diet in the treatment of diabetes and the relationship between the three main macronutrients and blood glucose level;Food label reading, portion sizes and measuring food.;Carbohydrate counting;Reviewed blood glucose goals for pre and post meals and how to evaluate the patients' food intake on their blood glucose level.;Meal timing in regards to the patients' current diabetes medication.    Being Active Role of exercise on diabetes management, blood pressure control and cardiac health.    Medications Taught/reviewed insulin/injectables, injection, site rotation, insulin/injectables storage and needle disposal.;Reviewed patients medication for diabetes, action, purpose, timing of dose and side effects.    Monitoring Purpose and frequency of SMBG.;Taught/discussed recording of test results and interpretation of SMBG.;Identified appropriate SMBG and/or A1C goals.;Other (comment)   She has the [employer] but hasn't decided if she is going to use it.   Acute complications Taught prevention, symptoms, and  treatment of hypoglycemia - the 15 rule.    Chronic complications Relationship between chronic complications and blood glucose control    Diabetes Stress and Support Identified and addressed patients feelings and concerns about diabetes      Individualized Goals (developed by patient)   Reducing Risk Other (comment)   improve blood sugars, decrease medications, lead a healthier lifestyle     Outcomes   Expected Outcomes Demonstrated interest in learning. Expect positive outcomes    Future DMSE 4-6 wks         Individualized Plan for Diabetes Self-Management Training:   Learning Objective:  Patient will have a greater understanding of diabetes self-management. Patient education plan is to attend individual and/or group sessions per assessed needs and concerns.   Plan:   Patient Instructions  Check blood sugars 2 x day before breakfast and 2 hrs after one meal every  day Bring blood sugar records to the next appointment  Exercise:  Begin walking for    10-15  minutes   3   days a week and gradually increase to 150 minutes/week  Eat 3 meals day,   1-2  snacks a day Space meals 4-6 hours apart Allow 2-3 hours between meals and snacks Don't skip meals - include 1 protein and 1 carbohydrate Include 1 serving of protein when eating fruit for a snack Complete 3 Day Food Record and bring to next appt  Carry fast acting glucose and a snack at all times Rotate injection sites  Return for appointment on:  Friday February 09, 2022 at 10:00 am with Freda Munro (nurse)   Expected Outcomes:  Demonstrated interest in learning. Expect positive outcomes  Education material provided:  General Meal Planning Guidelines Simple Meal Plan 3 Day Food Record Glucose tablets Symptoms, causes and treatments of Hypoglycemia Making Choices Using Food Labels (ADA)  If problems or questions, patient to contact team via:   Johny Drilling, RN, [place], [place] [phone]  Future DSME appointment:  4-6 wks February 09, 2022 with this educator

## 2022-01-12 NOTE — Patient Instructions (Signed)
Check blood sugars 2 x day before breakfast and 2 hrs after one meal every day Bring blood sugar records to the next appointment  Exercise:  Begin walking for    10-15  minutes   3   days a week and gradually increase to 150 minutes/week  Eat 3 meals day,   1-2  snacks a day Space meals 4-6 hours apart Allow 2-3 hours between meals and snacks Don't skip meals - include 1 protein and 1 carbohydrate Include 1 serving of protein when eating fruit for a snack Complete 3 Day Food Record and bring to next appt  Carry fast acting glucose and a snack at all times Rotate injection sites  Return for appointment on:  Friday February 09, 2022 at 10:00 am with Freda Munro (nurse)

## 2022-01-23 ENCOUNTER — Other Ambulatory Visit: Payer: Self-pay | Admitting: Internal Medicine

## 2022-01-23 DIAGNOSIS — E785 Hyperlipidemia, unspecified: Secondary | ICD-10-CM

## 2022-01-23 NOTE — Telephone Encounter (Signed)
Requested Prescriptions  Pending Prescriptions Disp Refills  . fenofibrate (TRICOR) 145 MG tablet [Pharmacy Med Name: FENOFIBRATE 145 MG TAB] 90 tablet 0    Sig: TAKE 1 TABLET BY MOUTH ONCE A DAY     Cardiovascular:  Antilipid - Fibric Acid Derivatives Failed - 01/23/2022 11:19 AM      Failed - Lipid Panel in normal range within the last 12 months    Cholesterol  Date Value Ref Range Status  01/31/2021 163 <200 mg/dL Final   LDL Cholesterol (Calc)  Date Value Ref Range Status  01/31/2021  mg/dL (calc) Final    Comment:    . LDL cholesterol not calculated. Triglyceride levels greater than 400 mg/dL invalidate calculated LDL results. . Reference range: <100 . Desirable range <100 mg/dL for primary prevention;   <70 mg/dL for patients with CHD or diabetic patients  with > or = 2 CHD risk factors. . LDL-C is now calculated using the Martin-Hopkins  calculation, which is a validated novel method providing  better accuracy than the Friedewald equation in the  estimation of LDL-C.  Martin SS et al. JAMA. 2013;310(19): 2061-2068  (http://education.[facility].com/faq/FAQ164)    Direct LDL  Date Value Ref Range Status  12/17/2019 63.0 mg/dL Final    Comment:    Optimal:  <100 mg/dLNear or Above Optimal:  100-129 mg/dLBorderline High:  130-159 mg/dLHigh:  160-189 mg/dLVery High:  >190 mg/dL   HDL  Date Value Ref Range Status  01/31/2021 57 > OR = 50 mg/dL Final   Triglycerides  Date Value Ref Range Status  01/31/2021 535 (H) <150 mg/dL Final    Comment:    . If a non-fasting specimen was collected, consider repeat triglyceride testing on a fasting specimen if clinically indicated.  Jacobson et al. J. of Clin. Lipidol. 2015;9:129-169. . . There is increased risk of pancreatitis when the  triglyceride concentration is very high  (> or = 500 mg/dL, especially if > or = 1000 mg/dL).  Jacobson et al. J. of Clin. Lipidol. 2015;9:129-169. .          Passed - ALT in  normal range and within 360 days    ALT  Date Value Ref Range Status  12/29/2021 18 6 - 29 U/L Final         Passed - AST in normal range and within 360 days    AST  Date Value Ref Range Status  12/29/2021 12 10 - 35 U/L Final    Comment:    Verified by repeat analysis. .          Passed - Cr in normal range and within 360 days    Creat  Date Value Ref Range Status  12/29/2021 0.94 0.50 - 1.05 mg/dL Final   Creatinine,U  Date Value Ref Range Status  12/15/2018 61.2 mg/dL Final   Creatinine, Urine  Date Value Ref Range Status  01/31/2021 110 20 - 275 mg/dL Final         Passed - HGB in normal range and within 360 days    Hemoglobin  Date Value Ref Range Status  12/29/2021 13.4 11.7 - 15.5 g/dL Final         Passed - HCT in normal range and within 360 days    HCT  Date Value Ref Range Status  12/29/2021 40.1 35.0 - 45.0 % Final         Passed - PLT in normal range and within 360 days    Platelets  Date Value Ref   Range Status  12/29/2021 330 140 - 400 Thousand/uL Final         Passed - WBC in normal range and within 360 days    WBC  Date Value Ref Range Status  12/29/2021 9.1 3.8 - 10.8 Thousand/uL Final         Passed - eGFR is 30 or above and within 360 days    GFR, Est African American  Date Value Ref Range Status  09/27/2016 87 >=60 mL/min Final   GFR, Est Non African American  Date Value Ref Range Status  09/27/2016 76 >=60 mL/min Final   GFR  Date Value Ref Range Status  12/17/2019 65.14 >60.00 mL/min Final   eGFR  Date Value Ref Range Status  12/29/2021 68 > OR = 60 mL/min/1.87m Final         Passed - Valid encounter within last 12 months    Recent Outpatient Visits          3 weeks ago Type 2 diabetes mellitus with hyperglycemia, with long-term current use of insulin ([place]   [facility] [place] [place]  7 months ago Feeling jittery   [facility]   9 months ago Right-sided  chest pain   [facility] [place] [facility]   9 months ago Acute non-recurrent maxillary sinusitis   [facility]   11 months ago Encounter for general adult medical examination with abnormal findings   [facility]      Future Appointments            In 6 days Ba[facility], [facility]

## 2022-01-29 ENCOUNTER — Encounter: Payer: HMO | Admitting: Physician Assistant

## 2022-01-29 ENCOUNTER — Encounter: Payer: HMO | Admitting: Internal Medicine

## 2022-02-02 ENCOUNTER — Encounter: Payer: Self-pay | Admitting: Physician Assistant

## 2022-02-02 ENCOUNTER — Ambulatory Visit (INDEPENDENT_AMBULATORY_CARE_PROVIDER_SITE_OTHER): Payer: HMO | Admitting: Physician Assistant

## 2022-02-02 VITALS — BP 127/67 | HR 83 | Ht 65.0 in | Wt 180.6 lb

## 2022-02-02 DIAGNOSIS — Z8639 Personal history of other endocrine, nutritional and metabolic disease: Secondary | ICD-10-CM

## 2022-02-02 DIAGNOSIS — E1165 Type 2 diabetes mellitus with hyperglycemia: Secondary | ICD-10-CM | POA: Diagnosis not present

## 2022-02-02 DIAGNOSIS — Z0000 Encounter for general adult medical examination without abnormal findings: Secondary | ICD-10-CM | POA: Diagnosis not present

## 2022-02-02 DIAGNOSIS — Z794 Long term (current) use of insulin: Secondary | ICD-10-CM

## 2022-02-02 DIAGNOSIS — Z23 Encounter for immunization: Secondary | ICD-10-CM

## 2022-02-02 DIAGNOSIS — Z0001 Encounter for general adult medical examination with abnormal findings: Secondary | ICD-10-CM | POA: Diagnosis not present

## 2022-02-02 HISTORY — DX: Encounter for general adult medical examination with abnormal findings: Z00.01

## 2022-02-02 MED ORDER — TIRZEPATIDE 2.5 MG/0.5ML ~~LOC~~ SOAJ: 2.5000 mg | SUBCUTANEOUS | 1 refills | Status: DC

## 2022-02-02 NOTE — Progress Notes (Signed)
Annual Physical Exam  Name: [name]   MRN: [id number]    DOB: 10-23-1955   Date:02/02/2022 Today's Provider: Talitha [name], MHS, PA-C Introduced myself to the patient as a PA-C and provided education on APPs in clinical practice.          Subjective  Chief Complaint  Chief Complaint  Patient presents with   Annual Exam    HPI  Patient presents for annual CPE and diabetes management  Diabetes, Type 2 - Last A1c 11.6 - Medications: Lantus 44 units IM QHS, Metformin 1000 mg PO BID, Glipizide 5 mg PO BID  - Compliance: excellent  - Checking BG at home: Checking in the AM and at night, 2 hours after eating. AM readings are usually 150-200, evening readings are typically in 300s  - Diet: She has been to a nutritionist and is going to return to see them to work with getting diet under better control.  - Eye exam: discussed importance of getting regular eye exams  Reports feeling more thirsty and more hungry, states she constantly craves sweets.    Diet: She is meeting with a nutritionist to help with her diabetes management Exercise:  she walks occasionally but is active throughout the day.  Sleep:Reports issue with sleep due to anxiety  Mood:reports anxious mood related to diabetes.    Depression: Phq 9 is  negative    02/02/2022    8:14 AM 01/12/2022    8:44 AM 01/31/2021    8:56 AM 12/17/2019    8:40 AM 12/15/2018    8:15 AM  Depression screen PHQ 2/9  Decreased Interest 1 0 0 0 0  Down, Depressed, Hopeless 0 0 0 0 0  PHQ - 2 Score 1 0 0 0 0  Altered sleeping 3  0    Tired, decreased energy 3  3    Change in appetite 3  0    Feeling bad or failure about yourself  0  0    Trouble concentrating 0  0    Moving slowly or fidgety/restless 0  0    Suicidal thoughts 0  0    PHQ-9 Score 10  3    Difficult doing work/chores Somewhat difficult  Not difficult at all         02/02/2022    8:14 AM  GAD 7 : Generalized Anxiety Score  Nervous, Anxious, on Edge 3   Control/stop worrying 3  Worry too much - different things 3  Trouble relaxing 3  Restless 2  Easily annoyed or irritable 1  Afraid - awful might happen 3  Total GAD 7 Score 18  Anxiety Difficulty Somewhat difficult      Hypertension: BP Readings from Last 3 Encounters:  02/02/22 127/67  01/12/22 130/80  12/29/21 133/76   Obesity: Wt Readings from Last 3 Encounters:  02/02/22 180 lb 9.6 oz (81.9 kg)  01/12/22 179 lb 12.8 oz (81.6 kg)  12/29/21 174 lb (78.9 kg)   BMI Readings from Last 3 Encounters:  02/02/22 30.05 kg/m  01/12/22 29.92 kg/m  12/29/21 28.96 kg/m     Vaccines:   HPV: aged out Tdap: due - discussed vaccine and  Shingrix: never done, discussed vaccine and it's importance in preventing illness, side effects and benefits. Pneumonia: has received one pneumonia vaccine- unsure of which one Flu: discussed today COVID-19: not interested in these at all.    Hep C Screening:  STD testing and prevention (HIV/chl/gon/syphilis):  Intimate partner violence: negative Sexual History : Menstrual History/LMP/Abnormal Bleeding: LMP was at age 42 Discussed importance of follow up if any post-menopausal bleeding: yes Incontinence Symptoms: No.  Breast cancer:  - Last Mammogram: up to date, next due in 2025 - BRCA gene screening:   Osteoporosis Prevention : Discussed high calcium and vitamin D supplementation, weight bearing exercises Bone density :not applicable  Cervical cancer screening:   Skin cancer: Discussed monitoring for atypical lesions  Colorectal cancer: Up to date, next due in 2025   Lung cancer:  Low Dose CT Chest recommended if Age 16-80 years, 20 pack-year currently smoking OR have quit w/in 15years. Patient does qualify.  She smoked for 20 years, quit 8 years ago.  ECG: NA  Advanced Care Planning: A voluntary discussion about advance care planning including the explanation and discussion of advance directives.  Discussed health care proxy  and Living will, and the patient was able to identify a health care proxy as her daughter, [name].  Patient does have a living will in effect.   Lipids: Lab Results  Component Value Date   CHOL 163 01/31/2021   CHOL 151 12/17/2019   CHOL 138 07/02/2019   Lab Results  Component Value Date   HDL 57 01/31/2021   HDL 52.10 12/17/2019   HDL 47.70 07/02/2019   Lab Results  Component Value Date   [facility]  01/31/2021     Comment:     . LDL cholesterol not calculated. Triglyceride levels greater than 400 mg/dL invalidate calculated LDL results. . Reference range: <100 . Desirable range <100 mg/dL for primary prevention;   <70 mg/dL for patients with CHD or diabetic patients  with > or = 2 CHD risk factors. Marland Kitchen LDL-C is now calculated using the Martin-Hopkins  calculation, which is a validated novel method providing  better accuracy than the Friedewald equation in the  estimation of LDL-C.  Cresenciano Genre et al. Annamaria Helling. 0350;093(81): 2061-2068  (http://education.[facility].com/faq/FAQ164)    LDLCALC 52 07/02/2019   LDLCALC 96 03/24/2015   Lab Results  Component Value Date   TRIG 535 (H) 01/31/2021   TRIG 304.0 (H) 12/17/2019   TRIG 191.0 (H) 07/02/2019   Lab Results  Component Value Date   CHOLHDL 2.9 01/31/2021   CHOLHDL 3 12/17/2019   CHOLHDL 3 07/02/2019   Lab Results  Component Value Date   LDLDIRECT 63.0 12/17/2019   LDLDIRECT 80.0 03/24/2019   LDLDIRECT 148.0 12/15/2018    Glucose: Glucose, Bld  Date Value Ref Range Status  12/29/2021 248 (H) 65 - 99 mg/dL Final    Comment:    .            Fasting reference interval . For someone without known diabetes, a glucose value >125 mg/dL indicates that they may have diabetes and this should be confirmed with a follow-up test. .   01/31/2021 210 (H) 65 - 99 mg/dL Final    Comment:    .            Fasting reference interval . For someone without known diabetes, a glucose value >125 mg/dL indicates that  they may have diabetes and this should be confirmed with a follow-up test. .   12/17/2019 151 (H) 70 - 99 mg/dL Final   Glucose-Capillary  Date Value Ref Range Status  12/08/2010 142 (H) 70 - 99 mg/dL Final  12/08/2010 125 (H) 70 - 99 mg/dL Final    Patient Active Problem List   Diagnosis Date Noted  Occasional tremors 12/29/2021   Anxiety 06/13/2021   Class 1 obesity due to excess calories with serious comorbidity and body mass index (BMI) of 31.0 to 31.9 in adult 01/31/2021   Insomnia 12/17/2019   Hyperlipidemia, mixed 07/18/2018   Arthritis 12/09/2017   BPPV (benign paroxysmal positional vertigo) 03/12/2016   Type 2 diabetes mellitus with hyperglycemia, with long-term current use of insulin ([place]) 04/27/2013   GERD (gastroesophageal reflux disease)     Past Surgical History:  Procedure Laterality Date   BLADDER SUSPENSION  12/08/2010   Procedure: TRANSVAGINAL TAPE (TVT) PROCEDURE;  Surgeon: Arloa Koh;  Location: [facility];  Service: Gynecology;  Laterality: N/A;  transvaginal tape with cystoscopy   BLADDER SUSPENSION  01/30/2011   Procedure: TRANSVAGINAL TAPE (TVT) PROCEDURE;  Surgeon: Arloa Koh;  Location: [facility];  Service: Gynecology;  Laterality: N/A;  excision of excess sling from suprapubic incision and cystoscopy   CERVICAL DISC SURGERY  2004   CYSTOSCOPY  01/30/2011   Procedure: CYSTOSCOPY;  Surgeon: Arloa Koh;  Location: [facility];  Service: Gynecology;  Laterality: N/A;   TONSILLECTOMY  1976   transvaginal tape  8/12   uterine ablation      Family History  Problem Relation Age of Onset   Diabetes Mother    Hypertension Mother    Hyperlipidemia Mother    Heart disease Mother    Heart disease Father    Hyperlipidemia Father    Diabetes Father    Heart attack Father    Varicose Veins Father    Peripheral vascular disease Father        amputation   Cancer Paternal Aunt        lung/ brain   Diabetes Maternal Grandmother     Social History    Socioeconomic History   Marital status: Widowed    Spouse name: Not on file   Number of children: Not on file   Years of education: Not on file   Highest education level: Not on file  Occupational History   Not on file  Tobacco Use   Smoking status: Former    Packs/day: 0.75    Years: 30.00    Total pack years: 22.50    Types: Cigarettes   Smokeless tobacco: Never   Tobacco comments:    recently quit 03/01/2015  Vaping Use   Vaping Use: Never used  Substance and Sexual Activity   Alcohol use: No    Alcohol/week: 0.0 standard drinks of alcohol   Drug use: No   Sexual activity: Yes  Other Topics Concern   Not on file  Social History Narrative   Not on file   Social Determinants of Health   Financial Resource Strain: Not on file  Food Insecurity: Not on file  Transportation Needs: Not on file  Physical Activity: Not on file  Stress: Not on file  Social Connections: Not on file  Intimate Partner Violence: Not on file     Current Outpatient Medications:    aspirin EC 81 MG tablet, Take 81 mg by mouth daily. Swallow whole., Disp: , Rfl:    B-D UF III MINI PEN NEEDLES 31G X 5 MM MISC, Use 4x a day, Disp: 200 each, Rfl: 3   Blood Glucose Monitoring Suppl (ONETOUCH VERIO) w/Device KIT, Use as instructed to check blood sugar 3X daily, Disp: 1 kit, Rfl: 0   CALCIUM PO, Take 1,200 mg by mouth daily., Disp: , Rfl:    Cholecalciferol (VITAMIN D-3) 125 MCG (5000 UT)  TABS, Take 5,000 Units by mouth daily., Disp: , Rfl:    CINNAMON PO, Take 2,000 mg by mouth 2 (two) times daily., Disp: , Rfl:    fenofibrate (TRICOR) 145 MG tablet, TAKE 1 TABLET BY MOUTH ONCE A DAY, Disp: 90 tablet, Rfl: 0   glipiZIDE (GLUCOTROL) 10 MG tablet, TAKE 1 TABLET (10 MG TOTAL) BY MOUTH 2 (TWO) TIMES DAILY BEFORE A MEAL. (Patient taking differently: Take 5 mg by mouth 2 (two) times daily before a meal.), Disp: 180 tablet, Rfl: 3   glucose blood (ONETOUCH VERIO) test strip, Use as instructed to check  blood sugar 3X daily, Disp: 300 each, Rfl: 3   Insulin Glargine (BASAGLAR KWIKPEN) 100 UNIT/ML, Inject 44 Units into the skin at bedtime., Disp: 30 mL, Rfl: 0   metFORMIN (GLUCOPHAGE) 1000 MG tablet, Take 1 tablet (1,000 mg total) by mouth 2 (two) times daily with a meal., Disp: 180 tablet, Rfl: 1   Multiple Vitamins-Minerals (MULTIVITAMINS THER. W/MINERALS) TABS, Take 1 tablet by mouth daily., Disp: , Rfl:    Omega-3 Fatty Acids (FISH OIL) 1000 MG CAPS, Take 1,000 mg by mouth 2 (two) times daily., Disp: , Rfl:    pantoprazole (PROTONIX) 40 MG tablet, Take 1 tablet (40 mg total) by mouth 2 (two) times daily., Disp: 180 tablet, Rfl: 1   tirzepatide (MOUNJARO) 2.5 MG/0.5ML Pen, Inject 2.5 mg into the skin once a week., Disp: 2 mL, Rfl: 1   Zinc Gluconate-Vitamin C (BL ZINC-VITAMIN C LOZENGE MT), Use as directed 1 tablet in the mouth or throat daily., Disp: , Rfl:    Continuous Blood Gluc Receiver (FREESTYLE LIBRE 2 READER) DEVI, 1 each by Does not apply route daily. (Patient not taking: Reported on 01/12/2022), Disp: 1 each, Rfl: 0   Continuous Blood Gluc Sensor (FREESTYLE LIBRE 2 SENSOR) MISC, 1 each by Does not apply route every 14 (fourteen) days. (Patient not taking: Reported on 01/12/2022), Disp: 6 each, Rfl: 3   diazepam (VALIUM) 2 MG tablet, Take 2 mg by mouth daily as needed. Takes for vertigo only (Patient not taking: Reported on 01/12/2022), Disp: , Rfl:   Allergies  Allergen Reactions   Jardiance [Empagliflozin]     Myalgias   Sulfa Antibiotics     Other reaction(s): Edema     Review of Systems  Constitutional:  Negative for fever.  Eyes:  Negative for blurred vision and double vision.  Respiratory:  Negative for cough, shortness of breath and wheezing.   Cardiovascular:  Negative for chest pain, palpitations and leg swelling.  Gastrointestinal:  Negative for blood in stool, constipation, diarrhea, nausea and vomiting.  Genitourinary:  Negative for dysuria, frequency and urgency.   Musculoskeletal:  Negative for falls.  Neurological:  Positive for tremors (bilaterally hands). Negative for dizziness, tingling, loss of consciousness, weakness and headaches.  Endo/Heme/Allergies:  Positive for polydipsia.  Psychiatric/Behavioral:  Negative for depression. The patient is nervous/anxious.       Objective  Vitals:   02/02/22 0812  BP: 127/67  Pulse: 83  SpO2: 99%  Weight: 180 lb 9.6 oz (81.9 kg)  Height: _0  (1.651 m)    Body mass index is 30.05 kg/m.  Physical Exam Vitals reviewed.  Constitutional:      General: She is awake.     Appearance: Normal appearance. She is well-developed and well-groomed. She is obese.  HENT:     Head: Normocephalic and atraumatic.     Mouth/Throat:     Mouth: Mucous membranes are moist.  Pharynx: Oropharynx is clear. Uvula midline. No pharyngeal swelling, oropharyngeal exudate, posterior oropharyngeal erythema or uvula swelling.  Eyes:     General: Lids are normal. Gaze aligned appropriately.     Extraocular Movements: Extraocular movements intact.     Right eye: Normal extraocular motion and no nystagmus.     Left eye: Normal extraocular motion and no nystagmus.     Conjunctiva/sclera: Conjunctivae normal.     Pupils: Pupils are equal, round, and reactive to light.  Neck:     Thyroid: No thyroid mass, thyromegaly or thyroid tenderness.  Cardiovascular:     Rate and Rhythm: Normal rate and regular rhythm.     Pulses: Normal pulses.          Radial pulses are 2+ on the right side and 2+ on the left side.     Heart sounds: Normal heart sounds. No murmur heard.    No friction rub. No gallop.  Pulmonary:     Effort: Pulmonary effort is normal.     Breath sounds: Normal breath sounds. No decreased air movement. No decreased breath sounds, wheezing, rhonchi or rales.  Abdominal:     General: Abdomen is flat. Bowel sounds are normal.     Palpations: Abdomen is soft.     Tenderness: There is no abdominal tenderness.   Musculoskeletal:     Cervical back: Normal range of motion and neck supple.     Right lower leg: No edema.     Left lower leg: No edema.  Lymphadenopathy:     Head:     Right side of head: No submental, submandibular or preauricular adenopathy.     Left side of head: No submental, submandibular or preauricular adenopathy.     Upper Body:     Right upper body: No supraclavicular adenopathy.     Left upper body: No supraclavicular adenopathy.  Neurological:     Mental Status: She is alert and oriented to person, place, and time.     GCS: GCS eye subscore is 4. GCS verbal subscore is 5. GCS motor subscore is 6.     Cranial Nerves: Cranial nerves 2-12 are intact. No dysarthria or facial asymmetry.     Motor: Motor function is intact. No weakness or tremor.     Coordination: Coordination is intact.     Deep Tendon Reflexes:     Reflex Scores:      Patellar reflexes are 2+ on the right side and 2+ on the left side. Psychiatric:        Attention and Perception: Attention and perception normal.        Mood and Affect: Mood and affect normal.        Speech: Speech normal.        Behavior: Behavior normal. Behavior is cooperative.        Thought Content: Thought content normal.        Cognition and Memory: Cognition and memory normal.        Judgment: Judgment normal.      Recent Results (from the past 2160 hour(s))  COMPLETE METABOLIC PANEL WITH GFR     Status: Abnormal   Collection Time: 12/29/21  2:37 PM  Result Value Ref Range   Glucose, Bld 248 (H) 65 - 99 mg/dL    Comment: .            Fasting reference interval . For someone without known diabetes, a glucose value >125 mg/dL indicates that they may have diabetes and this  should be confirmed with a follow-up test. .    BUN 23 7 - 25 mg/dL   Creat 0.94 0.50 - 1.05 mg/dL   eGFR 68 > OR = 60 mL/min/1.47m   BUN/Creatinine Ratio SEE NOTE: 6 - 22 (calc)    Comment:    Not Reported: BUN and Creatinine are within     reference range. .    Sodium 136 135 - 146 mmol/L   Potassium 4.4 3.5 - 5.3 mmol/L   Chloride 99 98 - 110 mmol/L   CO2 23 20 - 32 mmol/L   Calcium 9.8 8.6 - 10.4 mg/dL   Total Protein 7.2 6.1 - 8.1 g/dL   Albumin 4.5 3.6 - 5.1 g/dL   Globulin 2.7 1.9 - 3.7 g/dL (calc)   AG Ratio 1.7 1.0 - 2.5 (calc)   Total Bilirubin 0.4 0.2 - 1.2 mg/dL   Alkaline phosphatase (APISO) 107 37 - 153 U/L   AST 12 10 - 35 U/L    Comment: Verified by repeat analysis. .Marland Kitchen   ALT 18 6 - 29 U/L  CBC w/Diff/Platelet     Status: Abnormal   Collection Time: 12/29/21  2:37 PM  Result Value Ref Range   WBC 9.1 3.8 - 10.8 Thousand/uL   RBC 4.65 3.80 - 5.10 Million/uL   Hemoglobin 13.4 11.7 - 15.5 g/dL   HCT 40.1 35.0 - 45.0 %   MCV 86.2 80.0 - 100.0 fL   MCH 28.8 27.0 - 33.0 pg   MCHC 33.4 32.0 - 36.0 g/dL   RDW 12.1 11.0 - 15.0 %   Platelets 330 140 - 400 Thousand/uL   MPV 12.1 7.5 - 12.5 fL   Neutro Abs 4,941 1,500 - 7,800 cells/uL   Lymphs Abs 3,576 850 - 3,900 cells/uL   Absolute Monocytes 519 200 - 950 cells/uL   Eosinophils Absolute 0 (L) 15 - 500 cells/uL   Basophils Absolute 64 0 - 200 cells/uL   Neutrophils Relative % 54.3 %   Total Lymphocyte 39.3 %   Monocytes Relative 5.7 %   Eosinophils Relative 0.0 %   Basophils Relative 0.7 %  B12     Status: None   Collection Time: 12/29/21  2:37 PM  Result Value Ref Range   Vitamin B-12 565 200 - 1,100 pg/mL  HgB A1c     Status: Abnormal   Collection Time: 12/29/21  2:37 PM  Result Value Ref Range   Hgb A1c MFr Bld 11.6 (H) <5.7 % of total Hgb    Comment: For someone without known diabetes, a hemoglobin A1c value of 6.5% or greater indicates that they may have  diabetes and this should be confirmed with a follow-up  test. . For someone with known diabetes, a value <7% indicates  that their diabetes is well controlled and a value  greater than or equal to 7% indicates suboptimal  control. A1c targets should be individualized based on   duration of diabetes, age, comorbid conditions, and  other considerations. . Currently, no consensus exists regarding use of hemoglobin A1c for diagnosis of diabetes for children. .    Mean Plasma Glucose 286 mg/dL   eAG (mmol/L) 15.9 mmol/L  TSH     Status: None   Collection Time: 12/29/21  2:37 PM  Result Value Ref Range   TSH 0.88 0.40 - 4.50 mIU/L  TEST AUTHORIZATION     Status: None   Collection Time: 02/02/22  9:35 AM  Result Value Ref Range   TEST NAME:  VIT D    TEST CODE: 17,306    CLIENT CONTACT: EMILY    REPORT ALWAYS MESSAGE SIGNATURE      Comment: . The laboratory testing on this patient was verbally requested or confirmed by the ordering physician or his or her authorized representative after contact with an employee of [employer]. Federal regulations require that we maintain on file written authorization for all laboratory testing.  Accordingly we are asking that the ordering physician or his or her authorized representative sign a copy of this report and promptly return it to the client service representative. . . Signature:____________________________________________________ .      Fall Risk:    02/02/2022    8:14 AM 01/12/2022    8:44 AM 01/31/2021    8:56 AM 07/05/2016    9:30 AM 03/24/2015    2:43 PM  Fall Risk   Falls in the past year? 0 0 0 No No  Number falls in past yr: 0  0    Injury with Fall? 0  0    Risk for fall due to : No Fall Risks  No Fall Risks    Follow up Falls evaluation completed  Falls evaluation completed       Functional Status Survey:     Assessment & Plan  Problem List Items Addressed This Visit       Endocrine   Type 2 diabetes mellitus with hyperglycemia, with long-term current use of insulin ([place]) - Primary    Chronic, historic condition Reports she has restarted Metformin 1000 mg PO BID as directed and is taking Glipizide 5 mg PO BID, Lantus 44 units injection QHS She states her blood sugars are still  high, fasting are 150-200 and evening measures are >300  She reports satisfaction with her first meeting regarding DM education and nutrition and has plans for follow up apts- encouraged her to proceed with this Discussed her current DM regimen, she is hesitant to add more medications but also acknowledges that her diet is limited by preferences She is amenable to adding Mounjaro 2.5 mg injections weekly to regimen with goal of improving hyperglycemia Recommend we recheck A1c in 2 months to assess for improvement and response to new medication Follow up sooner if concerns arise.        Relevant Medications   tirzepatide (MOUNJARO) 2.5 MG/0.5ML Pen   Other Relevant Orders   Lipid Profile   Other Visit Diagnoses     Need for immunization against influenza       Relevant Orders   Flu Vaccine QUAD 6+ mos PF IM (Fluarix Quad PF) (Completed)   H/O vitamin D deficiency       Relevant Orders   Vitamin D (25 hydroxy)   Encounter for routine adult health examination without abnormal findings     -USPSTF grade A and B recommendations reviewed with patient; age-appropriate recommendations, preventive care, screening tests, etc discussed and encouraged; healthy living encouraged; see AVS for patient education given to patient -Discussed importance of 150 minutes of physical activity weekly, eat two servings of fish weekly, eat one serving of tree nuts ( cashews, pistachios, pecans, almonds.Marland Kitchen) every other day, eat 6 servings of fruit/vegetables daily and drink plenty of water and avoid sweet beverages.   -Reviewed Health Maintenance: Yes.     Relevant Orders   Lipid Profile   TSH        Return in about 2 months (around 04/04/2022) for Diabetes follow up, A1c and response to [facility]  I, Camilah Spillman E Rachel Samples, PA-C, have reviewed all documentation for this visit. The documentation on 02/02/22 for the exam, diagnosis, procedures, and orders are all accurate and complete.   [name], MHS,  PA-C [facility]

## 2022-02-02 NOTE — Progress Notes (Signed)
Established Patient Office Visit  Name: [name]   MRN: [id number]    DOB: 11/18/1955   Date:02/02/2022  Today's Provider: Talitha Givens, MHS, PA-C Introduced myself to the patient as a PA-C and provided education on APPs in clinical practice.         Subjective  Chief Complaint  Chief Complaint  Patient presents with   Annual Exam    HPI    Patient Active Problem List   Diagnosis Date Noted   Occasional tremors 12/29/2021   Anxiety 06/13/2021   Class 1 obesity due to excess calories with serious comorbidity and body mass index (BMI) of 31.0 to 31.9 in adult 01/31/2021   Insomnia 12/17/2019   Hyperlipidemia, mixed 07/18/2018   Arthritis 12/09/2017   BPPV (benign paroxysmal positional vertigo) 03/12/2016   Type 2 diabetes mellitus with hyperglycemia, with long-term current use of insulin ([place]) 04/27/2013   GERD (gastroesophageal reflux disease)     Past Surgical History:  Procedure Laterality Date   BLADDER SUSPENSION  12/08/2010   Procedure: TRANSVAGINAL TAPE (TVT) PROCEDURE;  Surgeon: Arloa Koh;  Location: [facility];  Service: Gynecology;  Laterality: N/A;  transvaginal tape with cystoscopy   BLADDER SUSPENSION  01/30/2011   Procedure: TRANSVAGINAL TAPE (TVT) PROCEDURE;  Surgeon: Arloa Koh;  Location: [facility];  Service: Gynecology;  Laterality: N/A;  excision of excess sling from suprapubic incision and cystoscopy   CERVICAL DISC SURGERY  2004   CYSTOSCOPY  01/30/2011   Procedure: CYSTOSCOPY;  Surgeon: Arloa Koh;  Location: [facility];  Service: Gynecology;  Laterality: N/A;   TONSILLECTOMY  1976   transvaginal tape  8/12   uterine ablation      Family History  Problem Relation Age of Onset   Diabetes Mother    Hypertension Mother    Hyperlipidemia Mother    Heart disease Mother    Heart disease Father    Hyperlipidemia Father    Diabetes Father    Heart attack Father    Varicose Veins Father    Peripheral vascular disease Father         amputation   Cancer Paternal Aunt        lung/ brain   Diabetes Maternal Grandmother     Social History   Tobacco Use   Smoking status: Former    Packs/day: 0.75    Years: 30.00    Total pack years: 22.50    Types: Cigarettes   Smokeless tobacco: Never   Tobacco comments:    recently quit 03/01/2015  Substance Use Topics   Alcohol use: No    Alcohol/week: 0.0 standard drinks of alcohol     Current Outpatient Medications:    aspirin EC 81 MG tablet, Take 81 mg by mouth daily. Swallow whole., Disp: , Rfl:    B-D UF III MINI PEN NEEDLES 31G X 5 MM MISC, Use 4x a day, Disp: 200 each, Rfl: 3   Blood Glucose Monitoring Suppl (ONETOUCH VERIO) w/Device KIT, Use as instructed to check blood sugar 3X daily, Disp: 1 kit, Rfl: 0   CALCIUM PO, Take 1,200 mg by mouth daily., Disp: , Rfl:    Cholecalciferol (VITAMIN D-3) 125 MCG (5000 UT) TABS, Take 5,000 Units by mouth daily., Disp: , Rfl:    CINNAMON PO, Take 2,000 mg by mouth 2 (two) times daily., Disp: , Rfl:    fenofibrate (TRICOR) 145 MG tablet, TAKE 1 TABLET BY MOUTH ONCE A DAY,  Disp: 90 tablet, Rfl: 0   glipiZIDE (GLUCOTROL) 10 MG tablet, TAKE 1 TABLET (10 MG TOTAL) BY MOUTH 2 (TWO) TIMES DAILY BEFORE A MEAL. (Patient taking differently: Take 5 mg by mouth 2 (two) times daily before a meal.), Disp: 180 tablet, Rfl: 3   glucose blood (ONETOUCH VERIO) test strip, Use as instructed to check blood sugar 3X daily, Disp: 300 each, Rfl: 3   Insulin Glargine (BASAGLAR KWIKPEN) 100 UNIT/ML, Inject 44 Units into the skin at bedtime., Disp: 30 mL, Rfl: 0   metFORMIN (GLUCOPHAGE) 1000 MG tablet, Take 1 tablet (1,000 mg total) by mouth 2 (two) times daily with a meal., Disp: 180 tablet, Rfl: 1   Multiple Vitamins-Minerals (MULTIVITAMINS THER. W/MINERALS) TABS, Take 1 tablet by mouth daily., Disp: , Rfl:    Omega-3 Fatty Acids (FISH OIL) 1000 MG CAPS, Take 1,000 mg by mouth 2 (two) times daily., Disp: , Rfl:    pantoprazole (PROTONIX) 40 MG tablet,  Take 1 tablet (40 mg total) by mouth 2 (two) times daily., Disp: 180 tablet, Rfl: 1   Zinc Gluconate-Vitamin C (BL ZINC-VITAMIN C LOZENGE MT), Use as directed 1 tablet in the mouth or throat daily., Disp: , Rfl:    Continuous Blood Gluc Receiver (FREESTYLE LIBRE 2 READER) DEVI, 1 each by Does not apply route daily. (Patient not taking: Reported on 01/12/2022), Disp: 1 each, Rfl: 0   Continuous Blood Gluc Sensor (FREESTYLE LIBRE 2 SENSOR) MISC, 1 each by Does not apply route every 14 (fourteen) days. (Patient not taking: Reported on 01/12/2022), Disp: 6 each, Rfl: 3   diazepam (VALIUM) 2 MG tablet, Take 2 mg by mouth daily as needed. Takes for vertigo only (Patient not taking: Reported on 01/12/2022), Disp: , Rfl:   Allergies  Allergen Reactions   Jardiance [Empagliflozin]     Myalgias   Sulfa Antibiotics     Other reaction(s): Edema    I personally reviewed active problem list, medication list, allergies, health maintenance, lab results with the patient/caregiver today.   Review of Systems  Constitutional:  Positive for diaphoresis. Negative for weight loss.  Eyes:  Negative for blurred vision and double vision.  Gastrointestinal:        Increased appetite and cravings for sweets   Endo/Heme/Allergies:  Positive for polydipsia.      Objective  Vitals:   02/02/22 0812  BP: 127/67  Pulse: 83  SpO2: 99%  Weight: 180 lb 9.6 oz (81.9 kg)  Height: '5\' 5"'  (1.651 m)    Body mass index is 30.05 kg/m.  Physical Exam Vitals reviewed.  Constitutional:      General: She is awake.     Appearance: Normal appearance. She is well-developed and well-groomed. She is obese.  HENT:     Head: Normocephalic and atraumatic.  Pulmonary:     Effort: Pulmonary effort is normal.  Neurological:     Mental Status: She is alert.  Psychiatric:        Behavior: Behavior is cooperative.       Recent Results (from the past 2160 hour(s))  COMPLETE METABOLIC PANEL WITH GFR     Status: Abnormal    Collection Time: 12/29/21  2:37 PM  Result Value Ref Range   Glucose, Bld 248 (H) 65 - 99 mg/dL    Comment: .            Fasting reference interval . For someone without known diabetes, a glucose value >125 mg/dL indicates that they may have diabetes and this should  be confirmed with a follow-up test. .    BUN 23 7 - 25 mg/dL   Creat 0.94 0.50 - 1.05 mg/dL   eGFR 68 > OR = 60 mL/min/1.76m   BUN/Creatinine Ratio SEE NOTE: 6 - 22 (calc)    Comment:    Not Reported: BUN and Creatinine are within    reference range. .    Sodium 136 135 - 146 mmol/L   Potassium 4.4 3.5 - 5.3 mmol/L   Chloride 99 98 - 110 mmol/L   CO2 23 20 - 32 mmol/L   Calcium 9.8 8.6 - 10.4 mg/dL   Total Protein 7.2 6.1 - 8.1 g/dL   Albumin 4.5 3.6 - 5.1 g/dL   Globulin 2.7 1.9 - 3.7 g/dL (calc)   AG Ratio 1.7 1.0 - 2.5 (calc)   Total Bilirubin 0.4 0.2 - 1.2 mg/dL   Alkaline phosphatase (APISO) 107 37 - 153 U/L   AST 12 10 - 35 U/L    Comment: Verified by repeat analysis. .Marland Kitchen   ALT 18 6 - 29 U/L  CBC w/Diff/Platelet     Status: Abnormal   Collection Time: 12/29/21  2:37 PM  Result Value Ref Range   WBC 9.1 3.8 - 10.8 Thousand/uL   RBC 4.65 3.80 - 5.10 Million/uL   Hemoglobin 13.4 11.7 - 15.5 g/dL   HCT 40.1 35.0 - 45.0 %   MCV 86.2 80.0 - 100.0 fL   MCH 28.8 27.0 - 33.0 pg   MCHC 33.4 32.0 - 36.0 g/dL   RDW 12.1 11.0 - 15.0 %   Platelets 330 140 - 400 Thousand/uL   MPV 12.1 7.5 - 12.5 fL   Neutro Abs 4,941 1,500 - 7,800 cells/uL   Lymphs Abs 3,576 850 - 3,900 cells/uL   Absolute Monocytes 519 200 - 950 cells/uL   Eosinophils Absolute 0 (L) 15 - 500 cells/uL   Basophils Absolute 64 0 - 200 cells/uL   Neutrophils Relative % 54.3 %   Total Lymphocyte 39.3 %   Monocytes Relative 5.7 %   Eosinophils Relative 0.0 %   Basophils Relative 0.7 %  B12     Status: None   Collection Time: 12/29/21  2:37 PM  Result Value Ref Range   Vitamin B-12 565 200 - 1,100 pg/mL  HgB A1c     Status: Abnormal    Collection Time: 12/29/21  2:37 PM  Result Value Ref Range   Hgb A1c MFr Bld 11.6 (H) <5.7 % of total Hgb    Comment: For someone without known diabetes, a hemoglobin A1c value of 6.5% or greater indicates that they may have  diabetes and this should be confirmed with a follow-up  test. . For someone with known diabetes, a value <7% indicates  that their diabetes is well controlled and a value  greater than or equal to 7% indicates suboptimal  control. A1c targets should be individualized based on  duration of diabetes, age, comorbid conditions, and  other considerations. . Currently, no consensus exists regarding use of hemoglobin A1c for diagnosis of diabetes for children. .    Mean Plasma Glucose 286 mg/dL   eAG (mmol/L) 15.9 mmol/L  TSH     Status: None   Collection Time: 12/29/21  2:37 PM  Result Value Ref Range   TSH 0.88 0.40 - 4.50 mIU/L     PHQ2/9:    02/02/2022    8:14 AM 01/12/2022    8:44 AM 01/31/2021    8:56 AM 12/17/2019  8:40 AM 12/15/2018    8:15 AM  Depression screen PHQ 2/9  Decreased Interest 1 0 0 0 0  Down, Depressed, Hopeless 0 0 0 0 0  PHQ - 2 Score 1 0 0 0 0  Altered sleeping 3  0    Tired, decreased energy 3  3    Change in appetite 3  0    Feeling bad or failure about yourself  0  0    Trouble concentrating 0  0    Moving slowly or fidgety/restless 0  0    Suicidal thoughts 0  0    PHQ-9 Score 10  3    Difficult doing work/chores Somewhat difficult  Not difficult at all        Fall Risk:    02/02/2022    8:14 AM 01/12/2022    8:44 AM 01/31/2021    8:56 AM 07/05/2016    9:30 AM 03/24/2015    2:43 PM  Fall Risk   Falls in the past year? 0 0 0 No No  Number falls in past yr: 0  0    Injury with Fall? 0  0    Risk for fall due to : No Fall Risks  No Fall Risks    Follow up Falls evaluation completed  Falls evaluation completed        Functional Status Survey:      Assessment & Plan

## 2022-02-02 NOTE — Assessment & Plan Note (Addendum)
Chronic, historic condition Reports she has restarted Metformin 1000 mg PO BID as directed and is taking Glipizide 5 mg PO BID, Lantus 44 units injection QHS She states her blood sugars are still high, fasting are 150-200 and evening measures are >300  She reports satisfaction with her first meeting regarding DM education and nutrition and has plans for follow up apts- encouraged her to proceed with this Discussed her current DM regimen, she is hesitant to add more medications but also acknowledges that her diet is limited by preferences She is amenable to adding Mounjaro 2.5 mg injections weekly to regimen with goal of improving hyperglycemia Recommend we recheck A1c in 2 months to assess for improvement and response to new medication Follow up sooner if concerns arise.

## 2022-02-03 LAB — LIPID PANEL
Cholesterol: 295 mg/dL — ABNORMAL HIGH (ref <200)
HDL: 54 mg/dL (ref >=50)
LDL Cholesterol (Calc): 174 mg/dL (calc) — ABNORMAL HIGH
Non-HDL Cholesterol (Calc): 241 mg/dL (calc) — ABNORMAL HIGH (ref <130)
Total CHOL/HDL Ratio: 5.5 (calc) — ABNORMAL HIGH (ref <5.0)
Triglycerides: 396 mg/dL — ABNORMAL HIGH (ref <150)

## 2022-02-03 LAB — TEST AUTHORIZATION: TEST CODE:: 17306

## 2022-02-03 LAB — TSH: TSH: 1.07 mIU/L (ref 0.40–4.50)

## 2022-02-06 ENCOUNTER — Telehealth: Payer: Self-pay

## 2022-02-06 ENCOUNTER — Other Ambulatory Visit: Payer: Self-pay | Admitting: Internal Medicine

## 2022-02-06 MED ORDER — OZEMPIC (0.25 OR 0.5 MG/DOSE) 2 MG/3ML ~~LOC~~ SOPN: 0.5000 mg | PEN_INJECTOR | SUBCUTANEOUS | 0 refills | Status: DC

## 2022-02-06 MED ORDER — ATORVASTATIN CALCIUM 10 MG PO TABS
10.0000 mg | ORAL_TABLET | Freq: Every day | ORAL | 1 refills | Status: DC
Start: 2022-02-06 — End: 2022-10-29

## 2022-02-06 NOTE — Telephone Encounter (Signed)
Pt states she will call [facility] to see if the have Ozempic and give [place] a call back.  She recently saw Junie Panning for a physical on 09/29.  Junie Panning suggested to start back on a statin.  Pt agrees to try a statin again, but she would like to know if she should stay on her fenofibrate as well. Please send the cholesterol medicine to [facility].   Thanks,   -Mickel Baas

## 2022-02-06 NOTE — Telephone Encounter (Signed)
Copied from [place] [id number]. Topic: General - Inquiry >> Feb 02, 2022  2:11 PM Rosanne Ashing P wrote: Reason for CRM: Pt called saying she was in this morning and was prescribed Monjero.  She has called several pharmacies because hers does not have it.  No one has it right now.  She said she probably needs to go back on Trulicity.  CB@  [phone]

## 2022-02-06 NOTE — Telephone Encounter (Signed)
Ozempic sent to pharmacy.  Yes she should continue long-acting insulin along with Ozempic.  Basaglar is on her list.  Is she taking Lantus instead?

## 2022-02-06 NOTE — Telephone Encounter (Signed)
Pt is taking Lantus 40 Units at bedtime.  She needs a refill of Lantus and the needles sent to [facility] [place].    Thanks,   -Mickel Baas

## 2022-02-06 NOTE — Telephone Encounter (Signed)
Pt advised.  Apt 04/06/2022 at 8:20am  Thanks,   -Mickel Baas

## 2022-02-06 NOTE — Addendum Note (Signed)
Addended by: Jearld Fenton on: 02/06/2022 01:55 PM   Modules accepted: Orders

## 2022-02-06 NOTE — Telephone Encounter (Signed)
Pt requests that the [facility] [place]. Pt also requests call back to advise if she should continue taking Lantus with the Ozempic. Cb# [phone]

## 2022-02-06 NOTE — Telephone Encounter (Signed)
We could increase her dose to 5 mg if her pharmacy or any of the pharmacies around her have that.  Would she like to call and find out or would she like to just go ahead and switch to Ozempic?  Ozempic would be better than Trulicity.  She is also way past due for an appointment and needs to schedule this ASAP.

## 2022-02-06 NOTE — Addendum Note (Signed)
Addended by: Jearld Fenton on: 02/06/2022 12:38 PM   Modules accepted: Orders

## 2022-02-06 NOTE — Telephone Encounter (Signed)
I have sent Atorvastatin to pharmacy. She should continue fenofibrate and let me know about Ozempic. She should follow up with me around 03/31/22

## 2022-02-06 NOTE — Addendum Note (Signed)
Addended by: Ashley Royalty E on: 02/06/2022 04:37 PM   Modules accepted: Orders

## 2022-02-07 MED ORDER — BD PEN NEEDLE MINI U/F 31G X 5 MM MISC: 3 refills | Status: DC

## 2022-02-09 ENCOUNTER — Ambulatory Visit: Payer: HMO | Admitting: *Deleted

## 2022-02-15 ENCOUNTER — Telehealth: Payer: Self-pay | Admitting: *Deleted

## 2022-02-15 NOTE — Telephone Encounter (Signed)
Patient had to cancel appointment on 02/09/22 as she was out of town. Called her today and [place] 2 more [place]. Instructed her to call when she is able to schedule another appointment.

## 2022-02-26 ENCOUNTER — Telehealth: Payer: Self-pay | Admitting: Pharmacist

## 2022-02-26 NOTE — Chronic Care Management (AMB) (Signed)
   Outreach Note  02/26/2022 Name: [name] MRN: [id number] DOB: March 22, 1956  Referred by: Jearld Fenton, NP Reason for referral : No chief complaint on file.  This patient is appearing on the insurance-provided list due to A1C >9%  Lab Results  Component Value Date   HGBA1C 11.6 (H) 12/29/2021    Outreach to patient today by telephone. Patient expresses interest in working with Clinical Pharmacist and PCP on achieving blood sugar control.   Schedule initial appointment with Clinical Pharmacist. Ask patient to have medications available to review at that time.   Follow Up Plan: Will outreach to patient by telephone on 02/28/2022 at 2:30 pm

## 2022-02-28 ENCOUNTER — Encounter: Payer: Self-pay | Admitting: Pharmacist

## 2022-02-28 ENCOUNTER — Ambulatory Visit: Payer: HMO | Admitting: Pharmacist

## 2022-02-28 DIAGNOSIS — E782 Mixed hyperlipidemia: Secondary | ICD-10-CM

## 2022-02-28 DIAGNOSIS — E1165 Type 2 diabetes mellitus with hyperglycemia: Secondary | ICD-10-CM

## 2022-02-28 NOTE — Chronic Care Management (AMB) (Signed)
02/28/2022 Name: [name] MRN: [id number] DOB: [birth year]  Chief Complaint  Patient presents with   Medication Management    T2DM    [name] is a 66 y.o. year old female who presented for a telephone visit.   They were referred to the pharmacist by their PCP for assistance in managing diabetes and hyperlipidemia.   Subjective:  Care Team: Primary Care Provider: Jearld Fenton, NP ; Next Scheduled Visit: 04/06/2022   Medication Access/Adherence  [facility]acy[id number] [place] [id number]: [phone]: [phone]  Patient reports affordability concerns with their medications: No  Patient reports access/transportation concerns to their pharmacy: No  Patient reports adherence concerns with their medications:  No  Reports uses AM and PM weekly pillbox to organize her medications; denies missed doses   Diabetes:  Current medications:  Metformin 1000 mg twice daily Glipizide 10 mg tablet -  tablet (5 mg) twice daily before breakfast and supper Ozempic 0.25 mg weekly (Reports took 3rd dose on 10/22) Lantus insulin 44 units QHS  Medications tried in the past:   Current glucose readings: this morning before breakfast: 197 Using One Touch Verio meter; testing 2 times daily (fasting and 2 hours after supper)  Reports has picked up, but not yet started using Freestyle Libre 2 Continuous Glucose Meter -Note patient has a iPhone, which she plans to use in place of reader device for Freestyle system  Patient denies hypoglycemic s/sx including dizziness, shakiness, sweating.  -Reports carries glucose tablets with her in case of low blood sugar  Current meal patterns:  - Breakfast: scrambled or fried egg with bacon or sausage patty - Lunch can of pineapple or some watermelon  - Supper: seared steak or baked chicken and 1 can of turnip greens - Snacks: club crackers or microwave  popcorn - Drinks water with [employer] Current physical activity: Works in her yard, but not currently walking. Planning to join a gym   Hyperlipidemia/ASCVD Risk Reduction  Current lipid lowering medications: atorvastatin 10 mg daily; fenofibrate 145 mg daily  Antiplatelet regimen: aspirin 81 mg daily  Current physical activity: Works in her yard, but not currently walking. Planning to join a gym   Objective: Lab Results  Component Value Date   HGBA1C 11.6 (H) 12/29/2021    Lab Results  Component Value Date   CREATININE 0.94 12/29/2021   BUN 23 12/29/2021   NA 136 12/29/2021   K 4.4 12/29/2021   CL 99 12/29/2021   CO2 23 12/29/2021    Lab Results  Component Value Date   CHOL 295 (H) 02/02/2022   HDL 54 02/02/2022   LDLCALC 174 (H) 02/02/2022   LDLDIRECT 63.0 12/17/2019   TRIG 396 (H) 02/02/2022   CHOLHDL 5.5 (H) 02/02/2022    Medications Reviewed Today     Reviewed by DRennis Petty RPH-CPP (Pharmacist) on 02/28/22 at 157 Med List Status: <None>   Medication Order Taking? Sig Documenting Provider Last Dose Status Informant  aspirin EC 81 MG tablet [id number] Take 81 mg by mouth daily. Swallow whole. [provider] Taking Active   atorvastatin (LIPITOR) 10 MG tablet [id number] Take 1 tablet (10 mg total) by mouth daily. BJearld Fenton NP Taking Active   B-D UF III MINI PEN NEEDLES 31G X 5 MM MISC [id number] Use 4x a day BJearld Fenton NP  Active   Blood Glucose  Monitoring Suppl [facility] VERIO) w/Device Drucie Opitz [id number]  Use as instructed to check blood sugar 3X daily Philemon Kingdom, MD  Active   CALCIUM PO [id number] Yes Take 1,200 mg by mouth daily. [provider] Taking Active   Cholecalciferol (VITAMIN D-3) 25 MCG (1000 UT) CAPS [id number] Yes Take 1,000 Units by mouth daily. [provider] Taking Active   Continuous Blood Gluc Receiver (FREESTYLE LIBRE 2 READER) DEVI [id number]  1 each by Does not apply route  daily.  Patient not taking: Reported on 01/12/2022   Philemon Kingdom, MD  Active   Continuous Blood Gluc Sensor (FREESTYLE LIBRE 2 SENSOR) [place] [id number]  1 each by Does not apply route every 14 (fourteen) days.  Patient not taking: Reported on 01/12/2022   Philemon Kingdom, MD  Active   diazepam (VALIUM) 2 MG tablet [id number] Yes Take 2 mg by mouth daily as needed. Takes for vertigo only [provider] Taking Active   fenofibrate (TRICOR) 145 MG tablet [id number] Yes TAKE 1 TABLET BY MOUTH ONCE A DAY Baity, Coralie Keens, NP Taking Active   glipiZIDE (GLUCOTROL) 10 MG tablet [id number] Yes TAKE 1 TABLET (10 MG TOTAL) BY MOUTH 2 (TWO) TIMES DAILY BEFORE A MEAL.  Patient taking differently: Take 5 mg by mouth 2 (two) times daily before a meal.   Philemon Kingdom, MD Taking Active   glucose blood (ONETOUCH VERIO) test strip [id number]  Use as instructed to check blood sugar 3X daily Philemon Kingdom, MD  Active   Insulin Glargine [facility] KWIKPEN) 100 UNIT/ML [id number] Yes Inject 44 Units into the skin at bedtime. Philemon Kingdom, MD Taking Active   metFORMIN (GLUCOPHAGE) 1000 MG tablet [id number] Yes Take 1 tablet (1,000 mg total) by mouth 2 (two) times daily with a meal. Mecum, Erin E, PA-C Taking Active   Multiple Vitamins-Minerals (MULTIVITAMINS THER. W/MINERALS) Sheral Flow [id number] Yes Take 1 tablet by mouth daily. [provider] Taking Active Self  Omega-3 Fatty Acids (FISH OIL) 1000 MG CAPS [id number] Yes Take 1,000 mg by mouth 2 (two) times daily. [provider] Taking Active   OZEMPIC, 0.25 OR 0.5 MG/DOSE, 2 MG/3ML SOPN [id number] Yes Inject 0.5 mg into the skin once a week. Start with 0.24m weekly x 4 weeks then increase to 0.580mweekly injection. BaJearld FentonNP Taking Active   pantoprazole (PROTONIX) 40 MG tablet [id number] Take 1 tablet (40 mg total) by mouth 2 (two) times daily. BaJearld FentonNP Taking Active   Zinc Gluconate-Vitamin C (BL ZINC-VITAMIN C  LOZENGE MT) [id number] Use as directed 1 tablet in the mouth or throat daily. [provider] Taking Active               Assessment/Plan:   Comprehensive medication review performed; medication list updated in electronic medical record  Diabetes: - Currently uncontrolled - Reviewed long term cardiovascular and renal outcomes of uncontrolled blood sugar - Counsel patient to administer glipizide doses 30 minutes before breakfast and supper - Reviewed goal A1c, goal fasting, and goal 2 hour post prandial glucose - Reviewed dietary modifications including having regular well-balanced meals, while controlling carbohydrate portion sizes - Reviewed lifestyle modifications including: joining a gym to increase her exercise - Encourage patient to start using Freestyle Libre 2 CGM Counsel on using CGM monitoring as feedback on dietary choices Provide patient with link to [employer] MyChart and provide phone number for MyFreestyle program for support Will send patient link to share data with clinic  via [place] once has App setup [employer]	[place] patient on s/s of low blood sugar and how to treat lows Review rules of 15s - importance of using 15 grams of sugar to treat low, recheck blood sugar in 15 minutes, treat again if remains low or, if back to normal, having meal if mealtime or snack  Review examples of sources of 15 grams of sugar - Recommend to follow up with [place] to reschedule appointment   Follow Up Plan: Clinical Pharmacist will follow up with patient by telephone on 03/23/2022 at 8:30 am  Wallace Cullens, PharmD, [place] [phone]

## 2022-02-28 NOTE — Patient Instructions (Signed)
Goals Addressed             This Visit's Progress    Pharmacy Goals       Our goal A1c is less than 7%. This corresponds with fasting sugars less than 130 and 2 hour after meal sugars less than 180. Please check your blood sugar, keep a log of the results and have this record to review at upcoming appointments.  Our goal bad cholesterol, or LDL, is less than 70 . This is why it is important to continue taking your atorvastatin.  Wallace Cullens, PharmD, [place] [phone]

## 2022-03-14 LAB — HM PAP SMEAR: HPV, high-risk: NEGATIVE

## 2022-03-20 ENCOUNTER — Encounter: Payer: Self-pay | Admitting: *Deleted

## 2022-03-23 ENCOUNTER — Other Ambulatory Visit: Payer: Self-pay

## 2022-03-23 ENCOUNTER — Ambulatory Visit: Payer: HMO | Admitting: Pharmacist

## 2022-03-23 DIAGNOSIS — Z794 Long term (current) use of insulin: Secondary | ICD-10-CM

## 2022-03-23 DIAGNOSIS — E1165 Type 2 diabetes mellitus with hyperglycemia: Secondary | ICD-10-CM

## 2022-03-23 MED ORDER — METFORMIN HCL 1000 MG PO TABS: 1000.0000 mg | ORAL_TABLET | Freq: Two times a day (BID) | ORAL | 0 refills | Status: DC

## 2022-03-23 NOTE — Patient Instructions (Signed)
Goals Addressed             This Visit's Progress    Pharmacy Goals       Our goal A1c is less than 7%. This corresponds with fasting sugars less than 130 and 2 hour after meal sugars less than 180. Please use your Freestyle Libre 2 continuous glucose monitor for feedback and blood sugar control  Our goal bad cholesterol, or LDL, is less than 70 . This is why it is important to continue taking your atorvastatin.  Wallace Cullens, PharmD, [place] [phone]

## 2022-03-23 NOTE — Chronic Care Management (AMB) (Signed)
03/23/2022 Name: [name] MRN: [id number] DOB: 1956-03-20  Chief Complaint  Patient presents with   Medication Management    T2DM, HLD    ITHA KROEKER is a 66 y.o. year old female who presented for a telephone visit.   They were referred to the pharmacist by their PCP for assistance in managing diabetes and hyperlipidemia.    Subjective:  Care Team: Primary Care Provider: Jearld Fenton, NP ; Next Scheduled Visit: 04/06/2022  Medication Access/Adherence  Current Pharmacy:  [id number] [place] [id number]: [phone]: [phone]  Patient reports affordability concerns with their medications: Yes  Patient reports access/transportation concerns to their pharmacy: No  Patient reports adherence concerns with their medications:  No    Reports uses AM and PM weekly pillbox to organize her medications   Reports cost of Ozempic is difficult to afford when in the coverage gap of her [employer] coverage (currently in coverage gap) - Reports has recently picked up a 13-monthupply of Ozempic Rx from pharmacy - Review requirements for patient assistance program through [employer]:   Current medications:  Metformin 1000 mg twice daily Glipizide 10 mg tablet -  tablet (5 mg) twice daily before breakfast and supper Ozempic 0.5 mg weekly on Sundays (Reports increased to current dose on 11/5) Reports has had some episodes of stomach cramps and nausea since increased dose Lantus insulin 30 units QHS (reports self-decreased dose ~2 weeks ago)   Medications tried in the past: Trulicity (cost)   Current glucose readings: this morning before breakfast: 130; reports readings staying under 200 Using One Touch Verio meter; testing 2 times daily (fasting and 2 hours after supper)   Reports started using Freestyle Libre 2 Continuous Glucose Meter -Note patient has a iPhone,  which she plans to use in place of reader device for Freestyle system   Patient denies hypoglycemia -Reports carries glucose tablets with her in case of low blood sugar   Reports has been trying new foods and working on controlling carbohydrate portion sizes  Current physical activity: Started using a home rowing machine ~30 minutes/day x 7 days/week and walking   Reports that she is pleased to have lost ~10 lbs over past 2 months. Feels like Ozempic is helping significantly with her portion control    Hyperlipidemia/ASCVD Risk Reduction   Current lipid lowering medications: atorvastatin 10 mg daily; fenofibrate 145 mg daily   Antiplatelet regimen: aspirin 81 mg daily   Current physical activity: Started using a home rowing machine ~30 minutes/day x 7 days/week and walking   Objective: Lab Results  Component Value Date   HGBA1C 11.6 (H) 12/29/2021    Lab Results  Component Value Date   CREATININE 0.94 12/29/2021   BUN 23 12/29/2021   NA 136 12/29/2021   K 4.4 12/29/2021   CL 99 12/29/2021   CO2 23 12/29/2021    Lab Results  Component Value Date   CHOL 295 (H) 02/02/2022   HDL 54 02/02/2022   LDLCALC 174 (H) 02/02/2022   LDLDIRECT 63.0 12/17/2019   TRIG 396 (H) 02/02/2022   CHOLHDL 5.5 (H) 02/02/2022    Medications Reviewed Today     Reviewed by DeRennis PettyRPH-CPP (Pharmacist) on 03/23/22 at 1249  Med List Status: <None>   Medication Order Taking? Sig Documenting Provider Last Dose Status Informant  aspirin EC 81 MG tablet [id number] 81 mg  by mouth daily. Swallow whole. [provider]  Active   atorvastatin (LIPITOR) 10 MG tablet [id number]  Take 1 tablet (10 mg total) by mouth daily. Jearld Fenton, NP  Active   B-D UF III MINI PEN NEEDLES 31G X 5 MM MISC [id number]  Use 4x a day Jearld Fenton, NP  Active   Blood Glucose Monitoring Suppl [facility] VERIO) w/Device Drucie Opitz [id number]  Use as instructed to check blood sugar 3X daily Philemon Kingdom, MD  Active   CALCIUM PO [id number]  Take 1,200 mg by mouth daily. [provider]  Active   Cholecalciferol (VITAMIN D-3) 25 MCG (1000 UT) CAPS [id number]  Take 1,000 Units by mouth daily. [provider]  Active   Continuous Blood Gluc Receiver (FREESTYLE LIBRE 2 READER) DEVI [id number]  1 each by Does not apply route daily.  Patient not taking: Reported on 01/12/2022   Philemon Kingdom, MD  Active   Continuous Blood Gluc Sensor (FREESTYLE LIBRE 2 SENSOR) [place] [id number]  1 each by Does not apply route every 14 (fourteen) days.  Patient not taking: Reported on 01/12/2022   Philemon Kingdom, MD  Active   diazepam (VALIUM) 2 MG tablet [id number]  Take 2 mg by mouth daily as needed. Takes for vertigo only [provider]  Active   fenofibrate (TRICOR) 145 MG tablet [id number]  TAKE 1 TABLET BY MOUTH ONCE A DAY Baity, Coralie Keens, NP  Active   glipiZIDE (GLUCOTROL) 10 MG tablet [id number] Yes TAKE 1 TABLET (10 MG TOTAL) BY MOUTH 2 (TWO) TIMES DAILY BEFORE A MEAL.  Patient taking differently: Take 5 mg by mouth 2 (two) times daily before a meal.   Philemon Kingdom, MD Taking Active   glucose blood (ONETOUCH VERIO) test strip [id number]  Use as instructed to check blood sugar 3X daily Philemon Kingdom, MD  Active   Insulin Glargine [facility] KWIKPEN) 100 UNIT/ML [id number] Yes Inject 44 Units into the skin at bedtime.  Patient taking differently: Inject 30 Units into the skin at bedtime.   Philemon Kingdom, MD Taking Active   metFORMIN (GLUCOPHAGE) 1000 MG tablet [id number] Yes Take 1 tablet (1,000 mg total) by mouth 2 (two) times daily with a meal. Baity, Coralie Keens, NP Taking Active   Multiple Vitamins-Minerals (MULTIVITAMINS THER. W/MINERALS) TABS [id number]  Take 1 tablet by mouth daily. [provider]  Active Self  Omega-3 Fatty Acids (FISH OIL) 1000 MG CAPS [id number]  Take 1,000 mg by mouth 2 (two) times daily. [provider]  Active   OZEMPIC, 0.25 OR  0.5 MG/DOSE, 2 MG/3ML SOPN [id number] Yes Inject 0.5 mg into the skin once a week. Start with 0.64m weekly x 4 weeks then increase to 0.548mweekly injection.  Patient taking differently: Inject 0.5 mg into the skin once a week.   BaJearld FentonNP Taking Active   pantoprazole (PROTONIX) 40 MG tablet [id number] 1 tablet (40 mg total) by mouth 2 (two) times daily. BaJearld FentonNP  Active   Zinc Gluconate-Vitamin C (BL ZINC-VITAMIN C LOZENGE MT) [id number] as directed 1 tablet in the mouth or throat daily. [provider]  Active               Assessment/Plan:   Diabetes: - Currently uncontrolled based on latest A1C lab work - Reviewed goal A1c, goal fasting, and goal 2 hour post prandial glucose - Reviewed dietary modifications including having regular well-balanced meals, while controlling carbohydrate portion sizes - [employer]  patient on strategies to improve tolerability of Ozempic, including eating smaller meals spread throughout the day and avoiding fried or greasy foods. Note stomach side effects generally improve over time, but patient to contact our office if has more severe symptoms or symptoms do not improve - Counsel on using CGM monitoring as feedback on dietary choices/help with blood sugar control Send patient link to share data with clinic via [place] once has App setup KB Home	[place] patient on s/s of low blood sugar and how to treat lows Have reviewed rules of 15s - importance of using 15 grams of sugar to treat low, recheck blood sugar in 15 minutes, treat again if remains low or, if back to normal, having meal if mealtime or snack  Review examples of sources of 15 grams of sugar - Recommend to follow up with Fenton to reschedule appointment. Phone number provided - Patient to review financial information and follow up to let Clinical Pharmacist know if meets provided eligibility requirements - Will send message to PCP to  collaborate regarding patient diabetes medication management   Follow Up Plan: Clinical Pharmacist will follow up with patient by telephone on 05/14/2022 at 8:30 am   Wallace Cullens, PharmD, [facility] [phone]

## 2022-03-31 ENCOUNTER — Other Ambulatory Visit: Payer: Self-pay | Admitting: Internal Medicine

## 2022-03-31 DIAGNOSIS — E1165 Type 2 diabetes mellitus with hyperglycemia: Secondary | ICD-10-CM

## 2022-04-03 NOTE — Telephone Encounter (Signed)
Unable to refill per protocol, last refill by provider 03/23/22 for 60. Will refuse duplicate request.  Requested Prescriptions  Pending Prescriptions Disp Refills   metFORMIN (GLUCOPHAGE) 1000 MG tablet [Pharmacy Med Name: METFORMIN HCL 1,000 MG TABLET] 60 tablet 0    Sig: TAKE 1 TABLET (1,000 MG TOTAL) BY MOUTH TWICE A DAY WITH FOOD     Endocrinology:  Diabetes - Biguanides Failed - 03/31/2022  1:47 PM      Failed - HBA1C is between 0 and 7.9 and within 180 days    Hgb A1c MFr Bld  Date Value Ref Range Status  12/29/2021 11.6 (H) <5.7 % of total Hgb Final    Comment:    For someone without known diabetes, a hemoglobin A1c value of 6.5% or greater indicates that they may have  diabetes and this should be confirmed with a follow-up  test. . For someone with known diabetes, a value <7% indicates  that their diabetes is well controlled and a value  greater than or equal to 7% indicates suboptimal  control. A1c targets should be individualized based on  duration of diabetes, age, comorbid conditions, and  other considerations. . Currently, no consensus exists regarding use of hemoglobin A1c for diagnosis of diabetes for children. .          Passed - Cr in normal range and within 360 days    Creat  Date Value Ref Range Status  12/29/2021 0.94 0.50 - 1.05 mg/dL Final   Creatinine,U  Date Value Ref Range Status  12/15/2018 61.2 mg/dL Final   Creatinine, Urine  Date Value Ref Range Status  01/31/2021 110 20 - 275 mg/dL Final         Passed - eGFR in normal range and within 360 days    GFR, Est African American  Date Value Ref Range Status  09/27/2016 87 >=60 mL/min Final   GFR, Est Non African American  Date Value Ref Range Status  09/27/2016 76 >=60 mL/min Final   GFR  Date Value Ref Range Status  12/17/2019 65.14 >60.00 mL/min Final   eGFR  Date Value Ref Range Status  12/29/2021 68 > OR = 60 mL/min/1.55m Final         Passed - B12 Level in normal range and  within 720 days    Vitamin B-12  Date Value Ref Range Status  12/29/2021 565 200 - 1,100 pg/mL Final         Passed - Valid encounter within last 6 months    Recent Outpatient Visits           1 week ago Type 2 diabetes mellitus with hyperglycemia, with long-term current use of insulin ([place], RPH-CPP   1 month ago Type 2 diabetes mellitus with hyperglycemia, with long-term current use of insulin ([place]   [place]rm[place], RPH-CPP   2 months ago Type 2 diabetes mellitus with hyperglycemia, with long-term current use of insulin ([place]   [place] PA-C   3 months ago Type 2 diabetes mellitus with hyperglycemia, with long-term current use of insulin ([facility]   [facility] [place]  9 months ago Feeling jittery   [facility] Keens [facility]       Future Appointments             In 3 days BJearld Fenton NP  [facility], [facility]            Passed - CBC within normal limits and completed in the last 12 months    WBC  Date Value Ref Range Status  12/29/2021 9.1 3.8 - 10.8 Thousand/uL Final   RBC  Date Value Ref Range Status  12/29/2021 4.65 3.80 - 5.10 Million/uL Final   Hemoglobin  Date Value Ref Range Status  12/29/2021 13.4 11.7 - 15.5 g/dL Final   HCT  Date Value Ref Range Status  12/29/2021 40.1 35.0 - 45.0 % Final   MCHC  Date Value Ref Range Status  12/29/2021 33.4 32.0 - 36.0 g/dL Final   [facility]  Date Value Ref Range Status  12/29/2021 28.8 27.0 - 33.0 pg Final   MCV  Date Value Ref Range Status  12/29/2021 86.2 80.0 - 100.0 fL Final   No results found for: "PLTCOUNTKUC", "LABPLAT", "POCPLA" RDW  Date Value Ref Range Status  12/29/2021 12.1 11.0 - 15.0 % Final

## 2022-04-05 DIAGNOSIS — Z780 Asymptomatic menopausal state: Secondary | ICD-10-CM | POA: Insufficient documentation

## 2022-04-06 ENCOUNTER — Encounter: Payer: Self-pay | Admitting: Internal Medicine

## 2022-04-06 ENCOUNTER — Ambulatory Visit: Payer: HMO

## 2022-04-06 ENCOUNTER — Other Ambulatory Visit: Payer: Self-pay | Admitting: Internal Medicine

## 2022-04-06 ENCOUNTER — Ambulatory Visit (INDEPENDENT_AMBULATORY_CARE_PROVIDER_SITE_OTHER): Payer: PPO | Admitting: Internal Medicine

## 2022-04-06 VITALS — BP 134/80 | Ht 65.0 in | Wt 188.0 lb

## 2022-04-06 VITALS — BP 134/80 | HR 88 | Temp 96.5°F | Wt 188.0 lb

## 2022-04-06 DIAGNOSIS — F411 Generalized anxiety disorder: Secondary | ICD-10-CM

## 2022-04-06 DIAGNOSIS — E6609 Other obesity due to excess calories: Secondary | ICD-10-CM

## 2022-04-06 DIAGNOSIS — E1165 Type 2 diabetes mellitus with hyperglycemia: Secondary | ICD-10-CM | POA: Diagnosis not present

## 2022-04-06 DIAGNOSIS — Z0001 Encounter for general adult medical examination with abnormal findings: Secondary | ICD-10-CM | POA: Diagnosis not present

## 2022-04-06 DIAGNOSIS — F419 Anxiety disorder, unspecified: Secondary | ICD-10-CM

## 2022-04-06 DIAGNOSIS — Z23 Encounter for immunization: Secondary | ICD-10-CM | POA: Diagnosis not present

## 2022-04-06 DIAGNOSIS — Z794 Long term (current) use of insulin: Secondary | ICD-10-CM | POA: Diagnosis not present

## 2022-04-06 DIAGNOSIS — Z0000 Encounter for general adult medical examination without abnormal findings: Secondary | ICD-10-CM

## 2022-04-06 DIAGNOSIS — Z6831 Body mass index (BMI) 31.0-31.9, adult: Secondary | ICD-10-CM

## 2022-04-06 MED ORDER — METFORMIN HCL 1000 MG PO TABS: 1000.0000 mg | ORAL_TABLET | Freq: Two times a day (BID) | ORAL | 1 refills | Status: DC

## 2022-04-06 MED ORDER — SERTRALINE HCL 50 MG PO TABS: 50.0000 mg | ORAL_TABLET | Freq: Every day | ORAL | 0 refills | Status: DC

## 2022-04-06 NOTE — Patient Instructions (Signed)
Health Maintenance for Postmenopausal Women Menopause is a normal process in which your ability to get pregnant comes to an end. This process happens slowly over many months or years, usually between the ages of 48 and 55. Menopause is complete when you have missed your menstrual period for 12 months. It is important to talk with your health care provider about some of the most common conditions that affect women after menopause (postmenopausal women). These include heart disease, cancer, and bone loss (osteoporosis). Adopting a healthy lifestyle and getting preventive care can help to promote your health and wellness. The actions you take can also lower your chances of developing some of these common conditions. What are the signs and symptoms of menopause? During menopause, you may have the following symptoms: Hot flashes. These can be moderate or severe. Night sweats. Decrease in sex drive. Mood swings. Headaches. Tiredness (fatigue). Irritability. Memory problems. Problems falling asleep or staying asleep. Talk with your health care provider about treatment options for your symptoms. Do I need hormone replacement therapy? Hormone replacement therapy is effective in treating symptoms that are caused by menopause, such as hot flashes and night sweats. Hormone replacement carries certain risks, especially as you become older. If you are thinking about using estrogen or estrogen with progestin, discuss the benefits and risks with your health care provider. How can I reduce my risk for heart disease and stroke? The risk of heart disease, heart attack, and stroke increases as you age. One of the causes may be a change in the body's hormones during menopause. This can affect how your body uses dietary fats, triglycerides, and cholesterol. Heart attack and stroke are medical emergencies. There are many things that you can do to help prevent heart disease and stroke. Watch your blood pressure High  blood pressure causes heart disease and increases the risk of stroke. This is more likely to develop in people who have high blood pressure readings or are overweight. Have your blood pressure checked: Every 3-5 years if you are 18-39 years of age. Every year if you are 40 years old or older. Eat a healthy diet  Eat a diet that includes plenty of vegetables, fruits, low-fat dairy products, and lean protein. Do not eat a lot of foods that are high in solid fats, added sugars, or sodium. Get regular exercise Get regular exercise. This is one of the most important things you can do for your health. Most adults should: Try to exercise for at least 150 minutes each week. The exercise should increase your heart rate and make you sweat (moderate-intensity exercise). Try to do strengthening exercises at least twice each week. Do these in addition to the moderate-intensity exercise. Spend less time sitting. Even light physical activity can be beneficial. Other tips Work with your health care provider to achieve or maintain a healthy weight. Do not use any products that contain nicotine or tobacco. These products include cigarettes, chewing tobacco, and vaping devices, such as e-cigarettes. If you need help quitting, ask your health care provider. Know your numbers. Ask your health care provider to check your cholesterol and your blood sugar (glucose). Continue to have your blood tested as directed by your health care provider. Do I need screening for cancer? Depending on your health history and family history, you may need to have cancer screenings at different stages of your life. This may include screening for: Breast cancer. Cervical cancer. Lung cancer. Colorectal cancer. What is my risk for osteoporosis? After menopause, you may be   at increased risk for osteoporosis. Osteoporosis is a condition in which bone destruction happens more quickly than new bone creation. To help prevent osteoporosis or  the bone fractures that can happen because of osteoporosis, you may take the following actions: If you are 19-50 years old, get at least 1,000 mg of calcium and at least 600 international units (IU) of vitamin D per day. If you are older than age 50 but younger than age 70, get at least 1,200 mg of calcium and at least 600 international units (IU) of vitamin D per day. If you are older than age 70, get at least 1,200 mg of calcium and at least 800 international units (IU) of vitamin D per day. Smoking and drinking excessive alcohol increase the risk of osteoporosis. Eat foods that are rich in calcium and vitamin D, and do weight-bearing exercises several times each week as directed by your health care provider. How does menopause affect my mental health? Depression may occur at any age, but it is more common as you become older. Common symptoms of depression include: Feeling depressed. Changes in sleep patterns. Changes in appetite or eating patterns. Feeling an overall lack of motivation or enjoyment of activities that you previously enjoyed. Frequent crying spells. Talk with your health care provider if you think that you are experiencing any of these symptoms. General instructions See your health care provider for regular wellness exams and vaccines. This may include: Scheduling regular health, dental, and eye exams. Getting and maintaining your vaccines. These include: Influenza vaccine. Get this vaccine each year before the flu season begins. Pneumonia vaccine. Shingles vaccine. Tetanus, diphtheria, and pertussis (Tdap) booster vaccine. Your health care provider may also recommend other immunizations. Tell your health care provider if you have ever been abused or do not feel safe at home. Summary Menopause is a normal process in which your ability to get pregnant comes to an end. This condition causes hot flashes, night sweats, decreased interest in sex, mood swings, headaches, or lack  of sleep. Treatment for this condition may include hormone replacement therapy. Take actions to keep yourself healthy, including exercising regularly, eating a healthy diet, watching your weight, and checking your blood pressure and blood sugar levels. Get screened for cancer and depression. Make sure that you are up to date with all your vaccines. This information is not intended to replace advice given to you by your health care provider. Make sure you discuss any questions you have with your health care provider. Document Revised: 09/12/2020 Document Reviewed: 09/12/2020 Elsevier Patient Education  2023 [employer]  

## 2022-04-06 NOTE — Assessment & Plan Note (Signed)
Encourage diet and exercise for weight loss 

## 2022-04-06 NOTE — Progress Notes (Signed)
Virtual Visit via Telephone Note  I connected with  [name] on 04/06/22 at 10:30 AM EST by telephone and verified that I am speaking with the correct person using two identifiers.  Location: Patient: home Provider: [facility] Persons participating in the virtual visit: [facility] [place]   I discussed the limitations, risks, security and privacy concerns of performing an evaluation and management service by telephone and the availability of in person appointments. The patient expressed understanding and agreed to proceed.  Interactive audio and video telecommunications were attempted between this nurse and patient, however failed, due to patient having technical difficulties OR patient did not have access to video capability.  We continued and completed visit with audio only.  Some vital signs may be absent or patient reported.   Dionisio David, LPN  Subjective:   [name] is a 66 y.o. female who presents for Medicare Annual (Subsequent) preventive examination.  Review of Systems     Cardiac Risk Factors include: advanced age (>64mn, >>32women)     Objective:    There were no vitals filed for this visit. There is no height or weight on file to calculate BMI.     04/06/2022   10:19 AM 01/12/2022    8:43 AM 06/18/2016    9:23 AM 04/18/2016    8:30 AM 01/30/2011    9:30 AM 11/30/2010    8:59 AM  Advanced Directives  Does Patient Have a Medical Advance Directive? No Yes No No Patient does not have advance directive Patient does not have advance directive  Does patient want to make changes to medical advance directive?  No - Patient declined      Would patient like information on creating a medical advance directive? No - Patient declined  No - Patient declined Yes (MAU/Ambulatory/Procedural Areas - Information given)    Pre-existing out of facility DNR order (yellow form or pink MOST form)     No     Current Medications (verified) Outpatient Encounter Medications  as of 04/06/2022  Medication Sig   aspirin EC 81 MG tablet Take 81 mg by mouth daily. Swallow whole.   atorvastatin (LIPITOR) 10 MG tablet Take 1 tablet (10 mg total) by mouth daily.   B-D UF III MINI PEN NEEDLES 31G X 5 MM MISC Use 4x a day   Blood Glucose Monitoring Suppl (ONETOUCH VERIO) w/Device KIT Use as instructed to check blood sugar 3X daily   CALCIUM PO Take 1,200 mg by mouth daily.   Cholecalciferol (VITAMIN D-3) 25 MCG (1000 UT) CAPS Take 1,000 Units by mouth daily.   Continuous Blood Gluc Receiver (FREESTYLE LIBRE 2 READER) DEVI 1 each by Does not apply route daily.   Continuous Blood Gluc Sensor (FREESTYLE LIBRE 2 SENSOR) MISC 1 each by Does not apply route every 14 (fourteen) days.   diazepam (VALIUM) 2 MG tablet Take 2 mg by mouth daily as needed. Takes for vertigo only   fenofibrate (TRICOR) 145 MG tablet TAKE 1 TABLET BY MOUTH ONCE A DAY   glipiZIDE (GLUCOTROL) 10 MG tablet TAKE 1 TABLET (10 MG TOTAL) BY MOUTH 2 (TWO) TIMES DAILY BEFORE A MEAL. (Patient taking differently: Take 5 mg by mouth 2 (two) times daily before a meal.)   glucose blood (ONETOUCH VERIO) test strip Use as instructed to check blood sugar 3X daily   Insulin Glargine (BASAGLAR KWIKPEN) 100 UNIT/ML Inject 44 Units into the skin at bedtime. (Patient taking differently: Inject 30 Units into the skin at  bedtime.)   metFORMIN (GLUCOPHAGE) 1000 MG tablet Take 1 tablet (1,000 mg total) by mouth 2 (two) times daily with a meal.   Multiple Vitamins-Minerals (MULTIVITAMINS THER. W/MINERALS) TABS Take 1 tablet by mouth daily.   Omega-3 Fatty Acids (FISH OIL) 1000 MG CAPS Take 1,000 mg by mouth 2 (two) times daily.   OZEMPIC, 0.25 OR 0.5 MG/DOSE, 2 MG/3ML SOPN Inject 0.5 mg into the skin once a week. Start with 0.101m weekly x 4 weeks then increase to 0.583mweekly injection. (Patient taking differently: Inject 0.5 mg into the skin once a week.)   pantoprazole (PROTONIX) 40 MG tablet Take 1 tablet (40 mg total) by mouth 2  (two) times daily.   sertraline (ZOLOFT) 50 MG tablet Take 1 tablet (50 mg total) by mouth daily.   Zinc Gluconate-Vitamin C (BL ZINC-VITAMIN C LOZENGE MT) Use as directed 1 tablet in the mouth or throat daily.   No facility-administered encounter medications on file as of 04/06/2022.    Allergies (verified) Jardiance [empagliflozin] and Sulfa antibiotics   History: Past Medical History:  Diagnosis Date   Blood transfusion    [facility]    occasional renal stone   Complication of anesthesia    woke during surgery (ablation) 2006?   Diabetes mellitus    type 2 Diab- A1C-7   Encounter for general adult medical examination with abnormal findings 02/02/2022   GERD (gastroesophageal reflux disease)    occasional Protonix-will take protonix night before surgery   Hypercholesteremia    on meds   Past Surgical History:  Procedure Laterality Date   BLADDER SUSPENSION  12/08/2010   Procedure: TRANSVAGINAL TAPE (TVT) PROCEDURE;  Surgeon: BrArloa Koh [facility];  Service: Gynecology;  Laterality: N/A;  transvaginal tape with cystoscopy   BLADDER SUSPENSION  01/30/2011   Procedure: TRANSVAGINAL TAPE (TVT) PROCEDURE;  Surgeon: BrArloa Koh [facility];  Service: Gynecology;  Laterality: N/A;  excision of excess sling from suprapubic incision and cystoscopy   CERVICAL DISC SURGERY  2004   CYSTOSCOPY  01/30/2011   Procedure: CYSTOSCOPY;  Surgeon: BrArloa Koh [facility];  Service: Gynecology;  Laterality: N/A;   TONSILLECTOMY  1976   transvaginal tape  8/12   uterine ablation     Family History  Problem Relation Age of Onset   Diabetes Mother    Hypertension Mother    Hyperlipidemia Mother    Heart disease Mother    Heart disease Father    Hyperlipidemia Father    Diabetes Father    Heart attack Father    Varicose Veins Father    Peripheral vascular disease Father        amputation   Cancer Paternal Aunt        lung/ brain    Diabetes Maternal Grandmother    Social History   Socioeconomic History   Marital status: Widowed    Spouse name: Not on file   Number of children: Not on file   Years of education: Not on file   Highest education level: Not on file  Occupational History   Not on file  Tobacco Use   Smoking status: Former    Packs/day: 0.75    Years: 30.00    Total pack years: 22.50    Types: Cigarettes   Smokeless tobacco: Never   Tobacco comments:    recently quit 03/01/2015  Vaping Use   Vaping Use: Never used  Substance  and Sexual Activity   Alcohol use: No    Alcohol/week: 0.0 standard drinks of alcohol   Drug use: No   Sexual activity: Yes  Other Topics Concern   Not on file  Social History Narrative   Not on file   Social Determinants of Health   Financial Resource Strain: Low Risk  (04/06/2022)   Overall Financial Resource Strain (CARDIA)    Difficulty of Paying Living Expenses: Not hard at all  Food Insecurity: No Food Insecurity (04/06/2022)   Hunger Vital Sign    Worried About Running Out of Food in the Last Year: Never true    Ran Out of Food in the Last Year: Never true  Transportation Needs: No Transportation Needs (04/06/2022)   PRAPARE - Hydrologist (Medical): No    Lack of Transportation (Non-Medical): No  Physical Activity: Insufficiently Active (04/06/2022)   Exercise Vital Sign    Days of Exercise per Week: 3 days    Minutes of Exercise per Session: 30 min  Stress: No Stress Concern Present (04/06/2022)   [place]    Feeling of Stress : Only a little  Social Connections: Moderately Isolated (04/06/2022)   Social Connection and Isolation Panel [NHANES]    Frequency of Communication with Friends and Family: More than three times a week    Frequency of Social Gatherings with Friends and Family: Three times a week    Attends Religious Services: More than 4 times per year     Active Member of Clubs or Organizations: No    Attends Archivist Meetings: Never    Marital Status: Widowed    Tobacco Counseling Counseling given: Not Answered Tobacco comments: recently quit 03/01/2015   Clinical Intake:  Pre-visit preparation completed: Yes  Pain : No/denies pain     Nutritional Risks: None Diabetes: Yes CBG done?: No Did pt. bring in CBG monitor from home?: No  How often do you need to have someone help you when you read instructions, pamphlets, or other written materials from your doctor or pharmacy?: 1 - Never  Diabetic?yes Nutrition Risk Assessment:  Has the patient had any N/V/D within the last 2 months?  Yes  Does the patient have any non-healing wounds?  No  Has the patient had any unintentional weight loss or weight gain?  No   Diabetes:  Is the patient diabetic?  Yes  If diabetic, was a CBG obtained today?  No  Did the patient bring in their glucometer from home?  No  How often do you monitor your CBG's? Twice/day.   Financial Strains and Diabetes Management:  Are you having any financial strains with the device, your supplies or your medication? No .  Does the patient want to be seen by Chronic Care Management for management of their diabetes?  No  Would the patient like to be referred to a Nutritionist or for Diabetic Management?  No - has endocrinologist  Diabetic Exams:  Diabetic Eye Exam: Completed 01/21/19. Pt has been advised about the importance in completing this exam.  Diabetic Foot Exam: Completed 01/31/21. Pt has been advised about the importance in completing this exam.    Interpreter Needed?: No  Information entered by :: Arnol Mcgibbon,LPN   Activities of Daily Living    04/06/2022   10:19 AM 04/06/2022    8:49 AM  In your present state of health, do you have any difficulty performing the following activities:  Hearing? 1 1  Vision? 0 0  Difficulty concentrating or making decisions? 0 0  Walking or  climbing stairs? 0 0  Dressing or bathing? 0 0  Doing errands, shopping? 0 0  Preparing Food and eating ? N   Using the Toilet? N   In the past six months, have you accidently leaked urine? N   Do you have problems with loss of bowel control? N   Managing your Medications? N   Managing your Finances? N   Housekeeping or managing your Housekeeping? N     Patient Care Team: Jearld Fenton, NP as PCP - General (Internal Medicine) Curley Spice, Virl Diamond, RPH-CPP as Pharmacist  Indicate any recent Medical Services you may have received from other than [facility] providers in the past year (date may be approximate).     Assessment:   This is a routine wellness examination for [place].  Hearing/Vision screen Hearing Screening - Comments:: Wears aids Vision Screening - Comments:: Readers- [facility]   Dietary issues and exercise activities discussed: Current Exercise Habits: Home exercise routine, Time (Minutes): 30, Frequency (Times/Week): 3, Weekly Exercise (Minutes/Week): 90, Intensity: Mild   Goals Addressed             This Visit's Progress    DIET - EAT MORE FRUITS AND VEGETABLES         Depression Screen    04/06/2022   10:17 AM 04/06/2022    8:49 AM 02/02/2022    8:14 AM 01/12/2022    8:44 AM 01/31/2021    8:56 AM 12/17/2019    8:40 AM 12/15/2018    8:15 AM  PHQ 2/9 Scores  PHQ - 2 Score 0 3 1 0 0 0 0  PHQ- 9 Score 0 _0 Fall Risk    04/06/2022   10:19 AM 04/06/2022    8:49 AM 04/02/2022    8:35 AM 02/02/2022    8:14 AM 01/12/2022    8:44 AM  Fall Risk   Falls in the past year? 0 0 0 0 0  Number falls in past yr: 0   0   Injury with Fall? 0 0  0   Risk for fall due to : No Fall Risks   No Fall Risks   Follow up Falls prevention discussed;Falls evaluation completed   Falls evaluation completed     FALL RISK PREVENTION PERTAINING TO THE HOME:  Any stairs in or around the home? No  If so, are there any without handrails? No  Home free of loose throw rugs in  walkways, pet beds, electrical cords, etc? Yes  Adequate lighting in your home to reduce risk of falls? Yes   ASSISTIVE DEVICES UTILIZED TO PREVENT FALLS:  Life alert? No  Use of a cane, walker or w/c? No  Grab bars in the bathroom? No  Shower chair or bench in shower? Yes  Elevated toilet seat or a handicapped toilet? No   Cognitive Function:        04/06/2022   10:24 AM  6CIT Screen  What Year? 0 points  What month? 0 points  What time? 0 points  Count back from 20 0 points  Months in reverse 0 points  Repeat phrase 0 points  Total Score 0 points    Immunizations Immunization History  Administered Date(s) Administered   Influenza Inj Mdck Quad Pf 02/13/2018   Influenza,inj,Quad PF,6+ Mos 02/01/2015, 01/16/2017, 02/13/2018, 02/06/2019, 02/04/2020, 01/31/2021, 02/02/2022   Influenza-Unspecified 05/07/2012,  02/04/2013, 02/04/2014, 02/07/2015   Pneumococcal Polysaccharide-23 07/05/2016, 04/06/2022   Pneumococcal-Unspecified 04/10/1995, 08/23/2009   Tdap 11/21/2011   Varicella 02/05/2011   Zoster, Live 07/05/2012    TDAP status: Due, Education has been provided regarding the importance of this vaccine. Advised may receive this vaccine at local pharmacy or Health Dept. Aware to provide a copy of the vaccination record if obtained from local pharmacy or Health Dept. Verbalized acceptance and understanding.  Flu Vaccine status: Up to date  Pneumococcal vaccine status: Up to date  Covid-19 vaccine status: Declined, Education has been provided regarding the importance of this vaccine but patient still declined. Advised may receive this vaccine at local pharmacy or Health Dept.or vaccine clinic. Aware to provide a copy of the vaccination record if obtained from local pharmacy or Health Dept. Verbalized acceptance and understanding.  Qualifies for Shingles Vaccine? Yes   Zostavax completed Yes   Shingrix Completed?: No.    Education has been provided regarding the importance of  this vaccine. Patient has been advised to call insurance company to determine out of pocket expense if they have not yet received this vaccine. Advised may also receive vaccine at local pharmacy or Health Dept. Verbalized acceptance and understanding.  Screening Tests Health Maintenance  Topic Date Due   Lung Cancer Screening  Never done   OPHTHALMOLOGY EXAM  01/21/2020   DTaP/Tdap/Td (2 - Td or Tdap) 11/20/2021   Diabetic kidney evaluation - Urine ACR  01/31/2022   PAP SMEAR-Modifier  02/22/2022   COVID-19 Vaccine (1) 04/22/2022 (Originally 02/03/1961)   Zoster Vaccines- Shingrix (1 of 2) 05/04/2022 (Originally 02/02/1975)   HEMOGLOBIN A1C  07/01/2022   MAMMOGRAM  10/12/2022   Diabetic kidney evaluation - GFR measurement  12/30/2022   FOOT EXAM  04/07/2023   Medicare Annual Wellness (AWV)  04/07/2023   COLONOSCOPY (Pts 45-67yr Insurance coverage will need to be confirmed)  01/06/2024   INFLUENZA VACCINE  Completed   Hepatitis C Screening  Completed   HIV Screening  Completed   HPV VACCINES  Aged Out    Health Maintenance  Health Maintenance Due  Topic Date Due   Lung Cancer Screening  Never done   OPHTHALMOLOGY EXAM  01/21/2020   DTaP/Tdap/Td (2 - Td or Tdap) 11/20/2021   Diabetic kidney evaluation - Urine ACR  01/31/2022   PAP SMEAR-Modifier  02/22/2022    Colorectal cancer screening: Type of screening: Colonoscopy. Completed 05/07/13. Repeat every 10 years  Mammogram status: Completed 10/11/21. Repeat every year  Bone Density status: Completed states had one last year. Results reflect: Bone density results: OSTEOPENIA. Repeat every 5 years.  Lung Cancer Screening: (Low Dose CT Chest recommended if Age 64-80years, 30 pack-year currently smoking OR have quit w/in 15years.) does not qualify.    Additional Screening:  Hepatitis C Screening: does qualify; Completed 07/05/16  Vision Screening: Recommended annual ophthalmology exams for early detection of glaucoma and other  disorders of the eye. Is the patient up to date with their annual eye exam?  Yes  Who is the provider or what is the name of the office in which the patient attends annual eye exams? [facility] If pt is not established with a provider, would they like to be referred to a provider to establish care? No .   Dental Screening: Recommended annual dental exams for proper oral hygiene  Community Resource Referral / Chronic Care Management: CRR required this visit?  No   CCM required this visit?  No  Plan:     I have personally reviewed and noted the following in the patient's chart:   Medical and social history Use of alcohol, tobacco or illicit drugs  Current medications and supplements including opioid prescriptions. Patient is not currently taking opioid prescriptions. Functional ability and status Nutritional status Physical activity Advanced directives List of other physicians Hospitalizations, surgeries, and ER visits in previous 12 months Vitals Screenings to include cognitive, depression, and falls Referrals and appointments  In addition, I have reviewed and discussed with patient certain preventive protocols, quality metrics, and best practice recommendations. A written personalized care plan for preventive services as well as general preventive health recommendations were provided to patient.     Dionisio David, LPN   01/07/2354   Nurse Notes:

## 2022-04-06 NOTE — Progress Notes (Signed)
Subjective:    Patient ID: [name], female    DOB: [birth year], 66 y.o.   MRN: [id number]  HPI  Patient presents to clinic today for annual exam.  Flu: 01/2022 Tetanus: 11/2011 COVID: Never Pneumovax: 07/2016 Shingrix: Never Zostavax: 07/2012 Pap smear: 02/2017 Mammogram: 10/2021 Bone density: 2022 Colon screening: 05/2013 Vision screening: annually Dentist: biannually  Diet: She does eat meat. She consumes fruits and veggies. She tries to avoid fried foods. She drinks moslty water. Exercise: rowing machine 30 minutes daily  Review of Systems     Past Medical History:  Diagnosis Date   Blood transfusion    1984   [facility]   Chronic kidney disease    occasional renal stone   Complication of anesthesia    woke during surgery (ablation) 2006?   Diabetes mellitus    type 2 Diab- A1C-7   Encounter for general adult medical examination with abnormal findings 02/02/2022   GERD (gastroesophageal reflux disease)    occasional Protonix-will take protonix night before surgery   Hypercholesteremia    on meds    Current Outpatient Medications  Medication Sig Dispense Refill   aspirin EC 81 MG tablet Take 81 mg by mouth daily. Swallow whole.     atorvastatin (LIPITOR) 10 MG tablet Take 1 tablet (10 mg total) by mouth daily. 90 tablet 1   B-D UF III MINI PEN NEEDLES 31G X 5 MM MISC Use 4x a day 200 each 3   Blood Glucose Monitoring Suppl (ONETOUCH VERIO) w/Device KIT Use as instructed to check blood sugar 3X daily 1 kit 0   CALCIUM PO Take 1,200 mg by mouth daily.     Cholecalciferol (VITAMIN D-3) 25 MCG (1000 UT) CAPS Take 1,000 Units by mouth daily.     Continuous Blood Gluc Receiver (FREESTYLE LIBRE 2 READER) DEVI 1 each by Does not apply route daily. (Patient not taking: Reported on 01/12/2022) 1 each 0   Continuous Blood Gluc Sensor (FREESTYLE LIBRE 2 SENSOR) MISC 1 each by Does not apply route every 14 (fourteen) days. (Patient not taking: Reported on 01/12/2022) 6 each 3    diazepam (VALIUM) 2 MG tablet Take 2 mg by mouth daily as needed. Takes for vertigo only     fenofibrate (TRICOR) 145 MG tablet TAKE 1 TABLET BY MOUTH ONCE A DAY 90 tablet 0   glipiZIDE (GLUCOTROL) 10 MG tablet TAKE 1 TABLET (10 MG TOTAL) BY MOUTH 2 (TWO) TIMES DAILY BEFORE A MEAL. (Patient taking differently: Take 5 mg by mouth 2 (two) times daily before a meal.) 180 tablet 3   glucose blood (ONETOUCH VERIO) test strip Use as instructed to check blood sugar 3X daily 300 each 3   Insulin Glargine (BASAGLAR KWIKPEN) 100 UNIT/ML Inject 44 Units into the skin at bedtime. (Patient taking differently: Inject 30 Units into the skin at bedtime.) 30 mL 0   metFORMIN (GLUCOPHAGE) 1000 MG tablet Take 1 tablet (1,000 mg total) by mouth 2 (two) times daily with a meal. 60 tablet 0   Multiple Vitamins-Minerals (MULTIVITAMINS THER. W/MINERALS) TABS Take 1 tablet by mouth daily.     Omega-3 Fatty Acids (FISH OIL) 1000 MG CAPS Take 1,000 mg by mouth 2 (two) times daily.     OZEMPIC, 0.25 OR 0.5 MG/DOSE, 2 MG/3ML SOPN Inject 0.5 mg into the skin once a week. Start with 0.22m weekly x 4 weeks then increase to 0.590mweekly injection. (Patient taking differently: Inject 0.5 mg into the skin once a week.) 9  mL 0   pantoprazole (PROTONIX) 40 MG tablet Take 1 tablet (40 mg total) by mouth 2 (two) times daily. 180 tablet 1   Zinc Gluconate-Vitamin C (BL ZINC-VITAMIN C LOZENGE MT) Use as directed 1 tablet in the mouth or throat daily.     No current facility-administered medications for this visit.    Allergies  Allergen Reactions   Jardiance [Empagliflozin]     Myalgias   Sulfa Antibiotics     Other reaction(s): Edema    Family History  Problem Relation Age of Onset   Diabetes Mother    Hypertension Mother    Hyperlipidemia Mother    Heart disease Mother    Heart disease Father    Hyperlipidemia Father    Diabetes Father    Heart attack Father    Varicose Veins Father    Peripheral vascular disease Father         amputation   Cancer Paternal Aunt        lung/ brain   Diabetes Maternal Grandmother     Social History   Socioeconomic History   Marital status: Widowed    Spouse name: Not on file   Number of children: Not on file   Years of education: Not on file   Highest education level: Not on file  Occupational History   Not on file  Tobacco Use   Smoking status: Former    Packs/day: 0.75    Years: 30.00    Total pack years: 22.50    Types: Cigarettes   Smokeless tobacco: Never   Tobacco comments:    recently quit 03/01/2015  Vaping Use   Vaping Use: Never used  Substance and Sexual Activity   Alcohol use: No    Alcohol/week: 0.0 standard drinks of alcohol   Drug use: No   Sexual activity: Yes  Other Topics Concern   Not on file  Social History Narrative   Not on file   Social Determinants of Health   Financial Resource Strain: Not on file  Food Insecurity: Not on file  Transportation Needs: Not on file  Physical Activity: Not on file  Stress: Not on file  Social Connections: Not on file  Intimate Partner Violence: Not on file     Constitutional: Denies fever, malaise, fatigue, headache or abrupt weight changes.  HEENT: Denies eye pain, eye redness, ear pain, ringing in the ears, wax buildup, runny nose, nasal congestion, bloody nose, or sore throat. Respiratory: Denies difficulty breathing, shortness of breath, cough or sputum production.   Cardiovascular: Denies chest pain, chest tightness, palpitations or swelling in the hands or feet.  Gastrointestinal: Patient reports intermittent abdominal cramping.  Denies abdominal pain, bloating, constipation, diarrhea or blood in the stool.  GU: Denies urgency, frequency, pain with urination, burning sensation, blood in urine, odor or discharge. Musculoskeletal: Patient reports joint pain.  Denies decrease in range of motion, difficulty with gait, muscle pain or joint swelling.  Skin: Denies redness, rashes, lesions or  ulcercations.  Neurological: Patient reports intermittent dizziness, insomnia.  Denies difficulty with memory, difficulty with speech or problems with balance and coordination.  Psych: Pt reports anxiety. Denies depression, SI/HI.  No other specific complaints in a complete review of systems (except as listed in HPI above).  Objective:   Physical Exam  BP 134/80 (BP Location: Left Arm, Patient Position: Sitting, Cuff Size: Normal)   Pulse 88   Temp (!) 96.5 F (35.8 C) (Temporal)   Wt 188 lb (85.3 kg)   SpO2  99%   BMI 31.28 kg/m   Wt Readings from Last 3 Encounters:  02/02/22 180 lb 9.6 oz (81.9 kg)  01/12/22 179 lb 12.8 oz (81.6 kg)  12/29/21 174 lb (78.9 kg)    General: Appears her stated age, obese, in NAD. Skin: Warm, dry and intact. No ulcerations noted. HEENT: Head: normal shape and size; Eyes: sclera white, no icterus, conjunctiva pink, PERRLA and EOMs intact;  Neck:  Neck supple, trachea midline. No masses, lumps or thyromegaly present.  Cardiovascular: Normal rate and rhythm. S1,S2 noted.  No murmur, rubs or gallops noted. No JVD or BLE edema. No carotid bruits noted. Pulmonary/Chest: Normal effort and positive vesicular breath sounds. No respiratory distress. No wheezes, rales or ronchi noted.  Abdomen: Normal bowel sounds.  Musculoskeletal: Strength 5/5 BUE/BLE.  No difficulty with gait.  Neurological: Alert and oriented. Cranial nerves II-XII grossly intact. Coordination normal.  Psychiatric: Mood and affect normal.  Mildly anxious appearing. Judgment and thought content normal.    BMET    Component Value Date/Time   NA 136 12/29/2021 1437   K 4.4 12/29/2021 1437   CL 99 12/29/2021 1437   CO2 23 12/29/2021 1437   GLUCOSE 248 (H) 12/29/2021 1437   BUN 23 12/29/2021 1437   CREATININE 0.94 12/29/2021 1437   CALCIUM 9.8 12/29/2021 1437   GFRNONAA 76 09/27/2016 0854   GFRAA 87 09/27/2016 0854    Lipid Panel     Component Value Date/Time   CHOL 295 (H)  02/02/2022 0935   TRIG 396 (H) 02/02/2022 0935   HDL 54 02/02/2022 0935   CHOLHDL 5.5 (H) 02/02/2022 0935   VLDL 60.8 (H) 12/17/2019 0901   LDLCALC 174 (H) 02/02/2022 0935    CBC    Component Value Date/Time   WBC 9.1 12/29/2021 1437   RBC 4.65 12/29/2021 1437   HGB 13.4 12/29/2021 1437   HCT 40.1 12/29/2021 1437   PLT 330 12/29/2021 1437   MCV 86.2 12/29/2021 1437   MCH 28.8 12/29/2021 1437   MCHC 33.4 12/29/2021 1437   RDW 12.1 12/29/2021 1437   LYMPHSABS 3,576 12/29/2021 1437   MONOABS 0.6 01/07/2014 1402   EOSABS 0 (L) 12/29/2021 1437   BASOSABS 64 12/29/2021 1437    Hgb A1C Lab Results  Component Value Date   HGBA1C 11.6 (H) 12/29/2021           Assessment & Plan:   Preventative Health Maintenance:  Flu shot UTD She declines tetanus for financial reasons, advised if she gets bit or cut to go get this done Pneumovax today We will get Prevnar 20 in 1 year Zostavax UTD Discussed Shingrix vaccine, she will check coverage with her insurance company and schedule a visit at the pharmacy if she would like to have this done Encouraged her to do her COVID-vaccine Pap smear UTD, will request copy Mammogram UTD Bone density UTD, will request copy Colon screening UTD She declines lung cancer screening Encouraged her to consume a balanced diet and exercise regimen Advised her to see an eye doctor and dentist annually We will check CBC, c-Met, lipid, A1c and urine microalbumin today  RTC in 3 months, follow-up chronic conditions Webb Silversmith, NP

## 2022-04-06 NOTE — Assessment & Plan Note (Signed)
We will trial sertraline again

## 2022-04-06 NOTE — Patient Instructions (Signed)
[name] , Thank you for taking time to come for your Medicare Wellness Visit. I appreciate your ongoing commitment to your health goals. Please review the following plan we discussed and let me know if I can assist you in the future.   Screening recommendations/referrals: Colonoscopy: 05/07/13 Mammogram: 10/11/21 Bone Density: has last year per pt Recommended yearly ophthalmology/optometry visit for glaucoma screening and checkup Recommended yearly dental visit for hygiene and checkup  Vaccinations: Influenza vaccine: 02/02/22 Pneumococcal vaccine: 07/05/16 Tdap vaccine: 11/21/11, due if have injury Shingles vaccine: Zostavax 07/05/12   Covid-19:n/d  Advanced directives: no  Conditions/risks identified: none  Next appointment: Follow up in one year for your annual wellness visit 04/12/23 @ 9:45 am by phone   Preventive Care 65 Years and Older, Female Preventive care refers to lifestyle choices and visits with your health care provider that can promote health and wellness. What does preventive care include? A yearly physical exam. This is also called an annual well check. Dental exams once or twice a year. Routine eye exams. Ask your health care provider how often you should have your eyes checked. Personal lifestyle choices, including: Daily care of your teeth and gums. Regular physical activity. Eating a healthy diet. Avoiding tobacco and drug use. Limiting alcohol use. Practicing safe sex. Taking low-dose aspirin every day. Taking vitamin and mineral supplements as recommended by your health care provider. What happens during an annual well check? The services and screenings done by your health care provider during your annual well check will depend on your age, overall health, lifestyle risk factors, and family history of disease. Counseling  Your health care provider may ask you questions about your: Alcohol use. Tobacco use. Drug use. Emotional well-being. Home and  relationship well-being. Sexual activity. Eating habits. History of falls. Memory and ability to understand (cognition). Work and work Statistician. Reproductive health. Screening  You may have the following tests or measurements: Height, weight, and BMI. Blood pressure. Lipid and cholesterol levels. These may be checked every 5 years, or more frequently if you are over 67 years old. Skin check. Lung cancer screening. You may have this screening every year starting at age 72 if you have a 30-pack-year history of smoking and currently smoke or have quit within the past 15 years. Fecal occult blood test (FOBT) of the stool. You may have this test every year starting at age 67. Flexible sigmoidoscopy or colonoscopy. You may have a sigmoidoscopy every 5 years or a colonoscopy every 10 years starting at age 38. Hepatitis C blood test. Hepatitis B blood test. Sexually transmitted disease (STD) testing. Diabetes screening. This is done by checking your blood sugar (glucose) after you have not eaten for a while (fasting). You may have this done every 1-3 years. Bone density scan. This is done to screen for osteoporosis. You may have this done starting at age 77. Mammogram. This may be done every 1-2 years. Talk to your health care provider about how often you should have regular mammograms. Talk with your health care provider about your test results, treatment options, and if necessary, the need for more tests. Vaccines  Your health care provider may recommend certain vaccines, such as: Influenza vaccine. This is recommended every year. Tetanus, diphtheria, and acellular pertussis (Tdap, Td) vaccine. You may need a Td booster every 10 years. Zoster vaccine. You may need this after age 108. Pneumococcal 13-valent conjugate (PCV13) vaccine. One dose is recommended after age 101. Pneumococcal polysaccharide (PPSV23) vaccine. One dose is recommended after  age 34. Talk to your health care provider  about which screenings and vaccines you need and how often you need them. This information is not intended to replace advice given to you by your health care provider. Make sure you discuss any questions you have with your health care provider. Document Released: 05/20/2015 Document Revised: 01/11/2016 Document Reviewed: 02/22/2015 Elsevier Interactive Patient Education  2017 [place] Prevention in the Home Falls can cause injuries. They can happen to people of all ages. There are many things you can do to make your home safe and to help prevent falls. What can I do on the outside of my home? Regularly fix the edges of walkways and driveways and fix any cracks. Remove anything that might make you trip as you walk through a door, such as a raised step or threshold. Trim any bushes or trees on the path to your home. Use bright outdoor lighting. Clear any walking paths of anything that might make someone trip, such as rocks or tools. Regularly check to see if handrails are loose or broken. Make sure that both sides of any steps have handrails. Any raised decks and porches should have guardrails on the edges. Have any leaves, snow, or ice cleared regularly. Use sand or salt on walking paths during winter. Clean up any spills in your garage right away. This includes oil or grease spills. What can I do in the bathroom? Use night lights. Install grab bars by the toilet and in the tub and shower. Do not use towel bars as grab bars. Use non-skid mats or decals in the tub or shower. If you need to sit down in the shower, use a plastic, non-slip stool. Keep the floor dry. Clean up any water that spills on the floor as soon as it happens. Remove soap buildup in the tub or shower regularly. Attach bath mats securely with double-sided non-slip rug tape. Do not have throw rugs and other things on the floor that can make you trip. What can I do in the bedroom? Use night lights. Make sure  that you have a light by your bed that is easy to reach. Do not use any sheets or blankets that are too big for your bed. They should not hang down onto the floor. Have a firm chair that has side arms. You can use this for support while you get dressed. Do not have throw rugs and other things on the floor that can make you trip. What can I do in the kitchen? Clean up any spills right away. Avoid walking on wet floors. Keep items that you use a lot in easy-to-reach places. If you need to reach something above you, use a strong step stool that has a grab bar. Keep electrical cords out of the way. Do not use floor polish or wax that makes floors slippery. If you must use wax, use non-skid floor wax. Do not have throw rugs and other things on the floor that can make you trip. What can I do with my stairs? Do not leave any items on the stairs. Make sure that there are handrails on both sides of the stairs and use them. Fix handrails that are broken or loose. Make sure that handrails are as long as the stairways. Check any carpeting to make sure that it is firmly attached to the stairs. Fix any carpet that is loose or worn. Avoid having throw rugs at the top or bottom of the stairs. If you do  have throw rugs, attach them to the floor with carpet tape. Make sure that you have a light switch at the top of the stairs and the bottom of the stairs. If you do not have them, ask someone to add them for you. What else can I do to help prevent falls? Wear shoes that: Do not have high heels. Have rubber bottoms. Are comfortable and fit you well. Are closed at the toe. Do not wear sandals. If you use a stepladder: Make sure that it is fully opened. Do not climb a closed stepladder. Make sure that both sides of the stepladder are locked into place. Ask someone to hold it for you, if possible. Clearly mark and make sure that you can see: Any grab bars or handrails. First and last steps. Where the edge of  each step is. Use tools that help you move around (mobility aids) if they are needed. These include: Canes. Walkers. Scooters. Crutches. Turn on the lights when you go into a dark area. Replace any light bulbs as soon as they burn out. Set up your furniture so you have a clear path. Avoid moving your furniture around. If any of your floors are uneven, fix them. If there are any pets around you, be aware of where they are. Review your medicines with your doctor. Some medicines can make you feel dizzy. This can increase your chance of falling. Ask your doctor what other things that you can do to help prevent falls. This information is not intended to replace advice given to you by your health care provider. Make sure you discuss any questions you have with your health care provider. Document Released: 02/17/2009 Document Revised: 09/29/2015 Document Reviewed: 05/28/2014 Elsevier Interactive Patient Education  2017 [employer].

## 2022-04-07 ENCOUNTER — Encounter: Payer: Self-pay | Admitting: Internal Medicine

## 2022-04-07 LAB — CBC
HCT: 39 % (ref 35.0–45.0)
Hemoglobin: 13 g/dL (ref 11.7–15.5)
MCH: 29.1 pg (ref 27.0–33.0)
MCHC: 33.3 g/dL (ref 32.0–36.0)
MCV: 87.4 fL (ref 80.0–100.0)
MPV: 11.8 fL (ref 7.5–12.5)
Platelets: 309 10*3/uL (ref 140–400)
RBC: 4.46 10*6/uL (ref 3.80–5.10)
RDW: 12.5 % (ref 11.0–15.0)
WBC: 8.3 10*3/uL (ref 3.8–10.8)

## 2022-04-07 LAB — COMPLETE METABOLIC PANEL WITH GFR
AG Ratio: 1.7 (calc) (ref 1.0–2.5)
ALT: 27 U/L (ref 6–29)
AST: 15 U/L (ref 10–35)
Albumin: 4.4 g/dL (ref 3.6–5.1)
Alkaline phosphatase (APISO): 74 U/L (ref 37–153)
BUN: 18 mg/dL (ref 7–25)
CO2: 24 mmol/L (ref 20–32)
Calcium: 9.5 mg/dL (ref 8.6–10.4)
Chloride: 99 mmol/L (ref 98–110)
Creat: 0.75 mg/dL (ref 0.50–1.05)
Globulin: 2.6 g/dL (calc) (ref 1.9–3.7)
Glucose, Bld: 218 mg/dL — ABNORMAL HIGH (ref 65–99)
Potassium: 4.5 mmol/L (ref 3.5–5.3)
Sodium: 135 mmol/L (ref 135–146)
Total Bilirubin: 0.4 mg/dL (ref 0.2–1.2)
Total Protein: 7 g/dL (ref 6.1–8.1)
eGFR: 89 mL/min/{1.73_m2} (ref >=60)

## 2022-04-07 LAB — LIPID PANEL
Cholesterol: 186 mg/dL (ref <200)
HDL: 53 mg/dL (ref >=50)
LDL Cholesterol (Calc): 83 mg/dL (calc)
Non-HDL Cholesterol (Calc): 133 mg/dL (calc) — ABNORMAL HIGH (ref <130)
Total CHOL/HDL Ratio: 3.5 (calc) (ref <5.0)
Triglycerides: 375 mg/dL — ABNORMAL HIGH (ref <150)

## 2022-04-07 LAB — MICROALBUMIN / CREATININE URINE RATIO
Creatinine, Urine: 75 mg/dL (ref 20–275)
Microalb Creat Ratio: 7 mcg/mg creat (ref <30)
Microalb, Ur: 0.5 mg/dL

## 2022-04-07 LAB — HEMOGLOBIN A1C
Hgb A1c MFr Bld: 10.2 % of total Hgb — ABNORMAL HIGH (ref <5.7)
Mean Plasma Glucose: 246 mg/dL
eAG (mmol/L): 13.6 mmol/L

## 2022-04-09 MED ORDER — EZETIMIBE 10 MG PO TABS: 10.0000 mg | ORAL_TABLET | Freq: Every day | ORAL | 1 refills | Status: DC

## 2022-04-09 NOTE — Addendum Note (Signed)
Addended by: Jearld Fenton on: 04/09/2022 08:04 AM   Modules accepted: Level of Service

## 2022-05-07 ENCOUNTER — Other Ambulatory Visit: Payer: Self-pay | Admitting: Internal Medicine

## 2022-05-14 ENCOUNTER — Ambulatory Visit: Payer: HMO | Admitting: Pharmacist

## 2022-05-14 DIAGNOSIS — E1165 Type 2 diabetes mellitus with hyperglycemia: Secondary | ICD-10-CM

## 2022-05-14 DIAGNOSIS — E782 Mixed hyperlipidemia: Secondary | ICD-10-CM

## 2022-05-14 NOTE — Patient Instructions (Signed)
Goals Addressed             This Visit's Progress    Pharmacy Goals       Our goal A1c is less than 7%. This corresponds with fasting sugars less than 130 and 2 hour after meal sugars less than 180. Please use your Freestyle Libre 2 continuous glucose monitor for feedback and blood sugar control  Our goal bad cholesterol, or LDL, is less than 70 . This is why it is important to continue taking your atorvastatin and ezetimibe.   Wallace Cullens, PharmD, [place] [phone]

## 2022-05-14 NOTE — Progress Notes (Signed)
05/14/2022 Name: [name] [name] MRN: [id number] DOB: [birth year]  Chief Complaint  Patient presents with   Medication Management    [name] [name] is a 67 y.o. year old female who presented for a telephone visit.   They were referred to the pharmacist by their PCP for assistance in managing diabetes and hyperlipidemia.    Subjective:  Today patient reports that she has an appointment scheduled with Neurologist, Dr. Christella Noa on 05/22/2022 regarding shaking in her hands.  Care Team: Primary Care Provider: Jearld Fenton, NP   Medication Access/Adherence  Current Pharmacy:  [id number] [place] [id number]: [phone]: [phone]  Patient reports affordability concerns with their medications: Yes  Patient reports access/transportation concerns to their pharmacy: No  Patient reports adherence concerns with their medications:  No    Reports uses AM and PM weekly pillbox to organize her medications    Reports cost of Ozempic is difficult to afford when in the coverage gap of her [employer] Medicare coverage, but with new calendar year, patient no longer in coverage gap of plan - Today patient reports her income is just above the cut off for the [employer]    Diabetes:   Current medications:  Metformin 1000 mg twice daily Glipizide 10 mg tablet -  tablet (5 mg) twice daily before breakfast and supper Reports again tried taking 10 mg twice daily as recommended by PCP in December, but decreased again as felt shaky on higher dose Ozempic 0.5 mg weekly on Sundays Reports has had some mild cramping, but otherwise tolerating well Lantus insulin 30 units QHS   Medications tried in the past: Trulicity (cost); glipizide 10 mg BID (shaky)   Using One Touch Verio meter; testing 3 times daily (fasting, midday and 2 hours after supper), but denies keeping a log of the  readings  Recalls current glucose readings: morning fasting ranging: 100-119; 2 hours after supper: 140-160   Reports no longer using Freestyle Libre 2 Continuous Glucose Meter as did not like wearing it, but may consider using again in the future if next A1C reading uncontrolled  Patient denies hypoglycemia -Reports carries glucose tablets with her in case of low blood sugar   Reports working on controlling carbohydrate portion sizes and significantly cut back on bedtime/overnight grazing.   Current physical activity: Using a home rowing machine ~30 minutes/day x 3-4 days/week and walking  Latest eye exam: August 2023  Feels like Ozempic is helping significantly with her portion control and losing weight, but does not weigh herself at home     Hyperlipidemia/ASCVD Risk Reduction   Current lipid lowering medications: atorvastatin 10 mg daily; fenofibrate 145 mg daily; ezetimibe 10 mg daily   Antiplatelet regimen: aspirin 81 mg daily   Current physical activity: Using a home rowing machine ~30 minutes/day x 3-4 days/week and walking   Health Maintenance  Health Maintenance Due  Topic Date Due   COVID-19 Vaccine (1) Never done   Zoster Vaccines- Shingrix (1 of 2) Never done   Lung Cancer Screening  Never done   OPHTHALMOLOGY EXAM  01/21/2020   DTaP/Tdap/Td (2 - Td or Tdap) 11/20/2021   PAP SMEAR-Modifier  02/22/2022     Objective: Lab Results  Component Value Date   HGBA1C 10.2 (H) 04/06/2022    Lab Results  Component Value Date   CREATININE 0.75 04/06/2022   BUN 18 04/06/2022   NA 135 04/06/2022  K 4.5 04/06/2022   CL 99 04/06/2022   CO2 24 04/06/2022    Lab Results  Component Value Date   CHOL 186 04/06/2022   HDL 53 04/06/2022   LDLCALC 83 04/06/2022   LDLDIRECT 63.0 12/17/2019   TRIG 375 (H) 04/06/2022   CHOLHDL 3.5 04/06/2022   BP Readings from Last 3 Encounters:  04/06/22 134/80  04/06/22 134/80  02/02/22 127/67   Pulse Readings from Last 3  Encounters:  04/06/22 88  02/02/22 83  12/29/21 90     Medications Reviewed Today     Reviewed by Rennis Petty, RPH-CPP (Pharmacist) on 05/14/22 at [place] List Status: <None>   Medication Order Taking? Sig Documenting Provider Last Dose Status Informant  aspirin EC 81 MG tablet [id number]  Take 81 mg by mouth daily. Swallow whole. [provider]  Active   atorvastatin (LIPITOR) 10 MG tablet [id number]  Take 1 tablet (10 mg total) by mouth daily. Jearld Fenton, NP  Active   B-D UF III MINI PEN NEEDLES 31G X 5 MM MISC [id number]  Use 4x a day Jearld Fenton, NP  Active   Blood Glucose Monitoring Suppl [facility] VERIO) w/Device Drucie Opitz [id number]  Use as instructed to check blood sugar 3X daily Philemon Kingdom, MD  Active   CALCIUM PO [id number]  Take 1,200 mg by mouth daily. [provider]  Active   Cholecalciferol (VITAMIN D-3) 25 MCG (1000 UT) CAPS [id number]  Take 1,000 Units by mouth daily. [provider]  Active   Continuous Blood Gluc Receiver (FREESTYLE LIBRE 2 READER) DEVI [id number]  1 each by Does not apply route daily. Philemon Kingdom, MD  Active   Continuous Blood Gluc Sensor (FREESTYLE LIBRE 2 SENSOR) [place] [id number]  1 each by Does not apply route every 14 (fourteen) days. Philemon Kingdom, MD  Active   diazepam (VALIUM) 2 MG tablet [id number]  Take 2 mg by mouth daily as needed. Takes for vertigo only [provider]  Active   ezetimibe (ZETIA) 10 MG tablet [id number] Yes Take 1 tablet (10 mg total) by mouth daily. Jearld Fenton, NP Taking Active   fenofibrate (TRICOR) 145 MG tablet [id number]  TAKE 1 TABLET BY MOUTH ONCE A DAY Baity, Coralie Keens, NP  Active   glipiZIDE (GLUCOTROL) 10 MG tablet [id number] Yes TAKE 1 TABLET (10 MG TOTAL) BY MOUTH 2 (TWO) TIMES DAILY BEFORE A MEAL.  Patient taking differently: Take 5 mg by mouth 2 (two) times daily before a meal.   Philemon Kingdom, MD Taking Active   glucose blood (ONETOUCH VERIO) test  strip [id number]  Use as instructed to check blood sugar 3X daily Philemon Kingdom, MD  Active   Insulin Glargine [facility] KWIKPEN) 100 UNIT/ML [id number] Yes Inject 44 Units into the skin at bedtime.  Patient taking differently: Inject 30 Units into the skin at bedtime.   Philemon Kingdom, MD Taking Active   metFORMIN (GLUCOPHAGE) 1000 MG tablet [id number] Yes Take 1 tablet (1,000 mg total) by mouth 2 (two) times daily with a meal. Baity, Coralie Keens, NP Taking Active   Multiple Vitamins-Minerals (MULTIVITAMINS THER. W/MINERALS) TABS [id number]  Take 1 tablet by mouth daily. [provider]  Active Self  Omega-3 Fatty Acids (FISH OIL) 1000 MG CAPS [id number]  Take 1,000 mg by mouth 2 (two) times daily. [provider]  Active   OZEMPIC, 0.25 OR 0.5 MG/DOSE, 2 MG/3ML SOPN [id number] Yes Inject 0.5 mg into the skin once a week. Webb Silversmith  W, NP Taking Active   pantoprazole (PROTONIX) 40 MG tablet [id number]  Take 1 tablet (40 mg total) by mouth 2 (two) times daily. Jearld Fenton, NP  Active   sertraline (ZOLOFT) 50 MG tablet [id number] Yes Take 1 tablet (50 mg total) by mouth daily. Jearld Fenton, NP Taking Active   Zinc Gluconate-Vitamin C (BL ZINC-VITAMIN C LOZENGE MT) [id number]  Use as directed 1 tablet in the mouth or throat daily. [provider]  Active               Assessment/Plan:   Diabetes: - Currently uncontrolled based on latest A1C lab work, but control improved based on reported home glucose readings - Reviewed goal A1c, goal fasting, and goal 2 hour post prandial glucose - Reviewed dietary modifications including having regular well-balanced meals, while controlling carbohydrate portion sizes - Review strategies to improve tolerability of Ozempic - Encourage patient to continue to monitor blood sugar, start to keep log of results, have this record to review during upcoming medical appointments, but to call office sooner if needed for readings outside  of established parameters or symptoms  Hyperlipidemia: - Encourage patient to continue taking atorvastatin, ezetimibe and fenofibrate as directed  Health Maintenance - Discussed CDC/ACIP recommendations for Shingrix and Tetanus vaccines. Recommended patient follow up with pharmacy to receive recommended vaccinations through Medicare Part D coverage   Follow Up Plan: Clinical Pharmacist will follow up with patient by telephone on 08/13/2022 at 8:30 am   Wallace Cullens, PharmD, [facility] [place] [phone]

## 2022-05-22 DIAGNOSIS — M4722 Other spondylosis with radiculopathy, cervical region: Secondary | ICD-10-CM | POA: Diagnosis not present

## 2022-05-22 DIAGNOSIS — Z6829 Body mass index (BMI) 29.0-29.9, adult: Secondary | ICD-10-CM | POA: Diagnosis not present

## 2022-05-30 ENCOUNTER — Telehealth: Payer: Self-pay | Admitting: Internal Medicine

## 2022-05-30 NOTE — Telephone Encounter (Signed)
Patient called stating she needs a referral to Neurology. She saw Dr Christella Noa, Neurosurgeon, and he suggested she see a neurologist for her tremor. His office notes were faxed to your office on 05/29/22.  Patient would like a call back about this referral

## 2022-05-31 NOTE — Telephone Encounter (Signed)
She will need to schedule follow-up ointment with me.  We have not discussed her tremor.  The neurologist office that I refer her to will want the office notes attached to it regarding what is been going on with her tremor so this is something I need to document.

## 2022-06-01 NOTE — Telephone Encounter (Signed)
Apt 06/05/2022 at 8am  Thanks,   -Mickel Baas

## 2022-06-04 ENCOUNTER — Other Ambulatory Visit: Payer: Self-pay | Admitting: Internal Medicine

## 2022-06-05 ENCOUNTER — Encounter: Payer: Self-pay | Admitting: Internal Medicine

## 2022-06-05 ENCOUNTER — Ambulatory Visit (INDEPENDENT_AMBULATORY_CARE_PROVIDER_SITE_OTHER): Payer: PPO | Admitting: Internal Medicine

## 2022-06-05 VITALS — BP 126/82 | HR 88 | Temp 96.8°F | Wt 179.0 lb

## 2022-06-05 DIAGNOSIS — H8113 Benign paroxysmal vertigo, bilateral: Secondary | ICD-10-CM

## 2022-06-05 DIAGNOSIS — R450 Nervousness: Secondary | ICD-10-CM | POA: Diagnosis not present

## 2022-06-05 DIAGNOSIS — R251 Tremor, unspecified: Secondary | ICD-10-CM | POA: Diagnosis not present

## 2022-06-05 DIAGNOSIS — F419 Anxiety disorder, unspecified: Secondary | ICD-10-CM | POA: Diagnosis not present

## 2022-06-05 MED ORDER — BASAGLAR KWIKPEN 100 UNIT/ML ~~LOC~~ SOPN: 44.0000 [IU] | PEN_INJECTOR | Freq: Every day | SUBCUTANEOUS | 0 refills | Status: DC

## 2022-06-05 MED ORDER — ONDANSETRON 8 MG PO TBDP: ORAL_TABLET | ORAL | 0 refills | Status: AC

## 2022-06-05 NOTE — Patient Instructions (Signed)
Essential Tremor ?A tremor is trembling or shaking that a person cannot control. Most tremors affect the hands or arms. Tremors can also affect the head, vocal cords, legs, and other parts of the body. Essential tremor is a tremor without a known cause. Usually, it occurs while a person is trying to perform an action. It tends to get worse gradually as a person ages. ?What are the causes? ?The cause of this condition is not known, but it often runs in families. ?What increases the risk? ?You are more likely to develop this condition if: ?You have a family member with essential tremor. ?You are 40 years of age or older. ?What are the signs or symptoms? ?The main sign of a tremor is a rhythmic shaking of certain parts of your body that is uncontrolled and unintentional. You may: ?Have difficulty eating with a spoon or fork. ?Have difficulty writing. ?Nod your head up and down or side to side. ?Have a quivering voice. ?The shaking may: ?Get worse over time. ?Come and go. ?Be more noticeable on one side of your body. ?Get worse due to stress, tiredness (fatigue), caffeine, and extreme heat or cold. ?How is this diagnosed? ?This condition may be diagnosed based on: ?Your symptoms and medical history. ?A physical exam. ?There is no single test to diagnose an essential tremor. However, your health care provider may order tests to rule out other causes of your condition. These may include: ?Blood and urine tests. ?Imaging studies of your brain, such as a CT scan or MRI. ?How is this treated? ?Treatment for essential tremor depends on the severity of the condition. ?Mild tremors may not need treatment if they do not affect your day-to-day life. ?Severe tremors may need to be treated using one or more of the following options: ?Medicines. ?Injections of a substance called botulinum toxin. ?Procedures such as deep brain stimulation (DBS) implantation or MRI-guided ultrasound treatment. ?Lifestyle changes. ?Occupational or  physical therapy. ?Follow these instructions at home: ?Lifestyle ? ?Do not use any products that contain nicotine or tobacco. These products include cigarettes, chewing tobacco, and vaping devices, such as e-cigarettes. If you need help quitting, ask your health care provider. ?Limit your caffeine intake as told by your health care provider. ?Try to get 8 hours of sleep each night. ?Find ways to manage your stress that fit your lifestyle and personality. Consider trying meditation or yoga. ?Try to anticipate stressful situations and allow extra time to manage them. ?If you are struggling emotionally with the effects of your tremor, consider working with a mental health provider. ?General instructions ?Take over-the-counter and prescription medicines only as told by your health care provider. ?Avoid extreme heat and extreme cold. ?Keep all follow-up visits. This is important. Visits may include physical therapy visits. ?Where to find more information ?National Institute of Neurological Disorders and Stroke: www.ninds.nih.gov ?Contact a health care provider if: ?You experience any changes in the location or intensity of your tremors. ?You start having a tremor after starting a new medicine. ?You have a tremor with other symptoms, such as: ?Numbness. ?Tingling. ?Pain. ?Weakness. ?Your tremor gets worse. ?Your tremor interferes with your daily life. ?You feel down, blue, or sad for at least 2 weeks in a row. ?Worrying about your tremor and what other people think about you interferes with your everyday life functions, including relationships, work, or school. ?Summary ?Essential tremor is a tremor without a known cause. Usually, it occurs when you are trying to perform an action. ?You are more likely   to develop this condition if you have a family member with essential tremor. ?The main sign of a tremor is a rhythmic shaking of certain parts of your body that is uncontrolled and unintentional. ?Treatment for essential  tremor depends on the severity of the condition. ?This information is not intended to replace advice given to you by your health care provider. Make sure you discuss any questions you have with your health care provider. ?Document Revised: 02/10/2021 Document Reviewed: 02/10/2021 ?Elsevier Patient Education ? 2023 Elsevier Inc. ? ?

## 2022-06-05 NOTE — Assessment & Plan Note (Signed)
Discussed increasing sertraline however she would like referral to neurology for further evaluation first

## 2022-06-05 NOTE — Assessment & Plan Note (Signed)
Will discontinue Valium and give Rx for Zofran 8 mg ODT

## 2022-06-05 NOTE — Progress Notes (Signed)
Subjective:    Patient ID: [name], female    DOB: [birth year], 67 y.o.   MRN: [id number]  HPI  Patient presents to clinic today requesting a neurology referral.  She reports feeling jittery and tremor of both hands that she has noticed this 3 years ago. She was seen 06/2021. She notices this is noticeable first thing in the morning but goes away shortly thereafter. She also notices it with certain situations. We thought maybe this could be anxiety so we started her on Sertraline but she does not feel like this helped her jitteriness and tremor. She has a history of diabetes, but has checked her sugars and they have not been low. She was seen again 01/2022,  Vitamin D and B12 have been checked and been within the normal range. She recently saw her neurosurgeon for follow up of her neck which she had surgery many years ago, and he recommended she see neurology. She does have some neck stiffness first thing in the morning but denies weakness of her upper extremities. She does not drink any alcohol. She has no family history of Parkinson's or benign tremor that she is aware.  She also has a history of GERD.  She typically takes Valium when this flares.  She found a old Rx of her husband's of Zofran 8 mg ODT and reports that this works a much better than the Valium and she would like to see if that she can get a prescription for this herself.  Review of Systems     Past Medical History:  Diagnosis Date   Blood transfusion    1984   [facility]   Chronic kidney disease    occasional renal stone   Complication of anesthesia    woke during surgery (ablation) 2006?   Diabetes mellitus    type 2 Diab- A1C-7   Encounter for general adult medical examination with abnormal findings 02/02/2022   GERD (gastroesophageal reflux disease)    occasional Protonix-will take protonix night before surgery   Hypercholesteremia    on meds    Current Outpatient Medications  Medication Sig Dispense Refill   aspirin  EC 81 MG tablet Take 81 mg by mouth daily. Swallow whole.     atorvastatin (LIPITOR) 10 MG tablet Take 1 tablet (10 mg total) by mouth daily. 90 tablet 1   B-D UF III MINI PEN NEEDLES 31G X 5 MM MISC Use 4x a day 200 each 3   Blood Glucose Monitoring Suppl (ONETOUCH VERIO) w/Device KIT Use as instructed to check blood sugar 3X daily 1 kit 0   CALCIUM PO Take 1,200 mg by mouth daily.     Cholecalciferol (VITAMIN D-3) 25 MCG (1000 UT) CAPS Take 1,000 Units by mouth daily.     Continuous Blood Gluc Receiver (FREESTYLE LIBRE 2 READER) DEVI 1 each by Does not apply route daily. 1 each 0   Continuous Blood Gluc Sensor (FREESTYLE LIBRE 2 SENSOR) MISC 1 each by Does not apply route every 14 (fourteen) days. 6 each 3   diazepam (VALIUM) 2 MG tablet Take 2 mg by mouth daily as needed. Takes for vertigo only     ezetimibe (ZETIA) 10 MG tablet Take 1 tablet (10 mg total) by mouth daily. 90 tablet 1   fenofibrate (TRICOR) 145 MG tablet TAKE 1 TABLET BY MOUTH ONCE A DAY 90 tablet 0   glipiZIDE (GLUCOTROL) 10 MG tablet TAKE 1 TABLET (10 MG TOTAL) BY MOUTH 2 (TWO) TIMES DAILY BEFORE A  MEAL. (Patient taking differently: Take 5 mg by mouth 2 (two) times daily before a meal.) 180 tablet 3   glucose blood (ONETOUCH VERIO) test strip Use as instructed to check blood sugar 3X daily 300 each 3   Insulin Glargine (BASAGLAR KWIKPEN) 100 UNIT/ML Inject 44 Units into the skin at bedtime. (Patient taking differently: Inject 30 Units into the skin at bedtime.) 30 mL 0   metFORMIN (GLUCOPHAGE) 1000 MG tablet Take 1 tablet (1,000 mg total) by mouth 2 (two) times daily with a meal. 180 tablet 1   Multiple Vitamins-Minerals (MULTIVITAMINS THER. W/MINERALS) TABS Take 1 tablet by mouth daily.     Omega-3 Fatty Acids (FISH OIL) 1000 MG CAPS Take 1,000 mg by mouth 2 (two) times daily.     OZEMPIC, 0.25 OR 0.5 MG/DOSE, 2 MG/3ML SOPN Inject 0.5 mg into the skin once a week. 3 mL 2   pantoprazole (PROTONIX) 40 MG tablet Take 1 tablet (40  mg total) by mouth 2 (two) times daily. 180 tablet 1   sertraline (ZOLOFT) 50 MG tablet Take 1 tablet (50 mg total) by mouth daily. 90 tablet 0   Zinc Gluconate-Vitamin C (BL ZINC-VITAMIN C LOZENGE MT) Use as directed 1 tablet in the mouth or throat daily.     No current facility-administered medications for this visit.    Allergies  Allergen Reactions   Jardiance [Empagliflozin]     Myalgias   Sulfa Antibiotics     Other reaction(s): Edema    Family History  Problem Relation Age of Onset   Diabetes Mother    Hypertension Mother    Hyperlipidemia Mother    Heart disease Mother    Heart disease Father    Hyperlipidemia Father    Diabetes Father    Heart attack Father    Varicose Veins Father    Peripheral vascular disease Father        amputation   Cancer Paternal Aunt        lung/ brain   Diabetes Maternal Grandmother     Social History   Socioeconomic History   Marital status: Widowed    Spouse name: Not on file   Number of children: Not on file   Years of education: Not on file   Highest education level: Not on file  Occupational History   Not on file  Tobacco Use   Smoking status: Former    Packs/day: 0.75    Years: 30.00    Total pack years: 22.50    Types: Cigarettes   Smokeless tobacco: Never   Tobacco comments:    recently quit 03/01/2015  Vaping Use   Vaping Use: Never used  Substance and Sexual Activity   Alcohol use: No    Alcohol/week: 0.0 standard drinks of alcohol   Drug use: No   Sexual activity: Yes  Other Topics Concern   Not on file  Social History Narrative   Not on file   Social Determinants of Health   Financial Resource Strain: Low Risk  (04/06/2022)   Overall Financial Resource Strain (CARDIA)    Difficulty of Paying Living Expenses: Not hard at all  Food Insecurity: No Food Insecurity (04/06/2022)   Hunger Vital Sign    Worried About Running Out of Food in the Last Year: Never true    [place] in the Last Year: Never  true  Transportation Needs: No Transportation Needs (04/06/2022)   PRAPARE - Hydrologist (Medical): No  Lack of Transportation (Non-Medical): No  Physical Activity: Insufficiently Active (04/06/2022)   Exercise Vital Sign    Days of Exercise per Week: 3 days    Minutes of Exercise per Session: 30 min  Stress: No Stress Concern Present (04/06/2022)   [place]    Feeling of Stress : Only a little  Social Connections: Moderately Isolated (04/06/2022)   Social Connection and Isolation Panel [NHANES]    Frequency of Communication with Friends and Family: More than three times a week    Frequency of Social Gatherings with Friends and Family: Three times a week    Attends Religious Services: More than 4 times per year    Active Member of Clubs or Organizations: No    Attends Archivist Meetings: Never    Marital Status: Widowed  Intimate Partner Violence: Not At Risk (04/06/2022)   Humiliation, Afraid, Rape, and Kick questionnaire    Fear of Current or Ex-Partner: No    Emotionally Abused: No    Physically Abused: No    Sexually Abused: No     Constitutional: Denies fever, malaise, fatigue, headache or abrupt weight changes.  Respiratory: Denies difficulty breathing, shortness of breath, cough or sputum production.   Cardiovascular: Denies chest pain, chest tightness, palpitations or swelling in the hands or feet.  Musculoskeletal: Patient reports neck stiffness.  Denies decrease in range of motion, difficulty with gait, muscle pain or joint pain and swelling.  Skin: Denies redness, rashes, lesions or ulcercations.  Neurological: Patient reports tremor, jitteriness and intermittent dizziness.  Denies difficulty with memory, difficulty with speech or problems with balance and coordination.  Psych: Patient has a history of anxiety.  Denies depression, SI/HI.  No other specific  complaints in a complete review of systems (except as listed in HPI above).  Objective:   Physical Exam  BP 126/82 (BP Location: Right Arm, Patient Position: Sitting, Cuff Size: Normal)   Pulse 88   Temp (!) 96.8 F (36 C) (Temporal)   Wt 179 lb (81.2 kg)   SpO2 99%   BMI 29.79 kg/m   Wt Readings from Last 3 Encounters:  04/06/22 188 lb (85.3 kg)  04/06/22 188 lb (85.3 kg)  02/02/22 180 lb 9.6 oz (81.9 kg)    General: Appears her stated age, overweight in NAD. Skin: Warm, dry and intact.  HEENT: Head: normal shape and size; Eyes: sclera white, no icterus, conjunctiva pink, PERRLA and EOMs intact;  Cardiovascular: Normal rate and rhythm. S1,S2 noted.  No murmur, rubs or gallops noted.  Radial pulses 2+ bilaterally. Pulmonary/Chest: Normal effort and positive vesicular breath sounds. No respiratory distress. No wheezes, rales or ronchi noted.  Musculoskeletal: Normal flexion, extension, rotation and lateral bending of cervical spine.  Shoulder shrug equal.  Handgrips equal.  No difficulty with gait.  Neurological: Alert and oriented.  Fine and gross motor coordination normal of BUE.  Negative Phalen's.  Negative Tinel's.  Slight resting left hand tremor noted with hands outstretched but no asterixis. Psychiatric: Mood and affect normal.  Mildly anxious appearing. Judgment and thought content normal.     BMET    Component Value Date/Time   NA 135 04/06/2022 0848   K 4.5 04/06/2022 0848   CL 99 04/06/2022 0848   CO2 24 04/06/2022 0848   GLUCOSE 218 (H) 04/06/2022 0848   BUN 18 04/06/2022 0848   CREATININE 0.75 04/06/2022 0848   CALCIUM 9.5 04/06/2022 0848   GFRNONAA 76 09/27/2016 0854  GFRAA 87 09/27/2016 0854    Lipid Panel     Component Value Date/Time   CHOL 186 04/06/2022 0848   TRIG 375 (H) 04/06/2022 0848   HDL 53 04/06/2022 0848   CHOLHDL 3.5 04/06/2022 0848   VLDL 60.8 (H) 12/17/2019 0901   LDLCALC 83 04/06/2022 0848    CBC    Component Value Date/Time    WBC 8.3 04/06/2022 0848   RBC 4.46 04/06/2022 0848   HGB 13.0 04/06/2022 0848   HCT 39.0 04/06/2022 0848   PLT 309 04/06/2022 0848   MCV 87.4 04/06/2022 0848   MCH 29.1 04/06/2022 0848   MCHC 33.3 04/06/2022 0848   RDW 12.5 04/06/2022 0848   LYMPHSABS 3,576 12/29/2021 1437   MONOABS 0.6 01/07/2014 1402   EOSABS 0 (L) 12/29/2021 1437   BASOSABS 64 12/29/2021 1437    Hgb A1C Lab Results  Component Value Date   HGBA1C 10.2 (H) 04/06/2022           Assessment & Plan:  Jittery, Tremor of Hands:  Referral to neurology for further evaluation   RTC in 1 month, follow-up chronic conditions Webb Silversmith, NP

## 2022-06-06 ENCOUNTER — Encounter: Payer: Self-pay | Admitting: Neurology

## 2022-06-15 ENCOUNTER — Other Ambulatory Visit: Payer: Self-pay | Admitting: Internal Medicine

## 2022-06-15 DIAGNOSIS — E1165 Type 2 diabetes mellitus with hyperglycemia: Secondary | ICD-10-CM

## 2022-06-17 ENCOUNTER — Other Ambulatory Visit: Payer: Self-pay | Admitting: Internal Medicine

## 2022-06-18 NOTE — Telephone Encounter (Signed)
Rx was sent to pharmacy on 02/06/22 #90/1 RF  Requested Prescriptions  Pending Prescriptions Disp Refills   atorvastatin (LIPITOR) 10 MG tablet [Pharmacy Med Name: ATORVASTATIN 10 MG TABLET] 90 tablet 1    Sig: TAKE 1 TABLET BY MOUTH EVERY DAY     Cardiovascular:  Antilipid - Statins Failed - 06/17/2022  8:32 AM      Failed - Lipid Panel in normal range within the last 12 months    Cholesterol  Date Value Ref Range Status  04/06/2022 186 <200 mg/dL Final   LDL Cholesterol (Calc)  Date Value Ref Range Status  04/06/2022 83 mg/dL (calc) Final    Comment:    Reference range: <100 . Desirable range <100 mg/dL for primary prevention;   <70 mg/dL for patients with CHD or diabetic patients  with > or = 2 CHD risk factors. Marland Kitchen LDL-C is now calculated using the Martin-Hopkins  calculation, which is a validated novel method providing  better accuracy than the Friedewald equation in the  estimation of LDL-C.  Cresenciano Genre et al. Annamaria Helling. [id number]): 2061-2068  (http://education.[facility].com/faq/FAQ164)    Direct LDL  Date Value Ref Range Status  12/17/2019 63.0 mg/dL Final    Comment:    Optimal:  <100 mg/dLNear or Above Optimal:  100-129 mg/dLBorderline High:  130-159 mg/dLHigh:  160-189 mg/dLVery High:  >190 mg/dL   HDL  Date Value Ref Range Status  04/06/2022 53 > OR = 50 mg/dL Final   Triglycerides  Date Value Ref Range Status  04/06/2022 375 (H) <150 mg/dL Final    Comment:    . If a non-fasting specimen was collected, consider repeat triglyceride testing on a fasting specimen if clinically indicated.  Yates Decamp et al. J. of Clin. Lipidol. [id number]. Marland Kitchen          Passed - Patient is not pregnant      Passed - Valid encounter within last 12 months    Recent Outpatient Visits           1 week ago [facility] [place], [place] W, NP   1 month ago Type 2 diabetes mellitus with hyperglycemia, with long-term current use of  insulin [facility])   [facility] [place], Virl Diamond, RPH-CPP   2 months ago Encounter for general adult medical examination with abnormal findings   [facility] [place], [place] W, NP   2 months ago Type 2 diabetes mellitus with hyperglycemia, with long-term current use of insulin [facility])   [place], Grayland Ormond A, RPH-CPP   3 months ago Type 2 diabetes mellitus with hyperglycemia, with long-term current use of insulin [facility])   [place], RPH-CPP       Future Appointments             In 3 weeks Baity, Coralie Keens, NP [facility], [place]   In 1 month Tat, Eustace Quail, [facility]

## 2022-07-04 ENCOUNTER — Other Ambulatory Visit: Payer: Self-pay | Admitting: Internal Medicine

## 2022-07-05 NOTE — Telephone Encounter (Signed)
Requested medication (s) are due for refill today:   Yes  Requested medication (s) are on the active medication list:   Yes  Future visit scheduled:   Yes in 4 days with Rollene Fare   Last ordered: 06/05/2022 30 ml, 0 refills  Returned because not sure if there would be changes during upcoming appt.     Requested Prescriptions  Pending Prescriptions Disp Refills   Insulin Glargine Solostar (LANTUS) 100 UNIT/ML Solostar Pen [Pharmacy Med Name: INSULIN GLARGINE SOLOSTAR U100]      Sig: INJECT 44 UNITS INTO THE SKIN AT BEDTIME.     Endocrinology:  Diabetes - Insulins Failed - 07/04/2022 12:08 PM      Failed - HBA1C is between 0 and 7.9 and within 180 days    Hgb A1c MFr Bld  Date Value Ref Range Status  04/06/2022 10.2 (H) <5.7 % of total Hgb Final    Comment:    For someone without known diabetes, a hemoglobin A1c value of 6.5% or greater indicates that they may have  diabetes and this should be confirmed with a follow-up  test. . For someone with known diabetes, a value <7% indicates  that their diabetes is well controlled and a value  greater than or equal to 7% indicates suboptimal  control. A1c targets should be individualized based on  duration of diabetes, age, comorbid conditions, and  other considerations. . Currently, no consensus exists regarding use of hemoglobin A1c for diagnosis of diabetes for children. Renella Cunas - Valid encounter within last 6 months    Recent Outpatient Visits           1 month ago [facility] [place], [place] W, NP   1 month ago Type 2 diabetes mellitus with hyperglycemia, with long-term current use of insulin [facility])   [facility] [place], Virl Diamond, RPH-CPP   3 months ago Encounter for general adult medical examination with abnormal findings   [facility] [place], [place] W, NP   3 months ago Type 2 diabetes mellitus with hyperglycemia, with  long-term current use of insulin [facility])   [place], Grayland Ormond A, RPH-CPP   4 months ago Type 2 diabetes mellitus with hyperglycemia, with long-term current use of insulin [facility])   [place], Virl Diamond, RPH-CPP       Future Appointments             In 4 days Baity, Coralie Keens, NP [facility], [place]   In 2 weeks Tat, Eustace Quail, [facility]

## 2022-07-09 ENCOUNTER — Ambulatory Visit (INDEPENDENT_AMBULATORY_CARE_PROVIDER_SITE_OTHER): Payer: PPO | Admitting: Internal Medicine

## 2022-07-09 ENCOUNTER — Encounter: Payer: Self-pay | Admitting: Internal Medicine

## 2022-07-09 VITALS — BP 122/68 | HR 75 | Temp 96.7°F | Wt 180.0 lb

## 2022-07-09 DIAGNOSIS — F419 Anxiety disorder, unspecified: Secondary | ICD-10-CM | POA: Diagnosis not present

## 2022-07-09 DIAGNOSIS — H8113 Benign paroxysmal vertigo, bilateral: Secondary | ICD-10-CM

## 2022-07-09 DIAGNOSIS — E1165 Type 2 diabetes mellitus with hyperglycemia: Secondary | ICD-10-CM

## 2022-07-09 DIAGNOSIS — E663 Overweight: Secondary | ICD-10-CM

## 2022-07-09 DIAGNOSIS — M816 Localized osteoporosis [Lequesne]: Secondary | ICD-10-CM

## 2022-07-09 DIAGNOSIS — Z6829 Body mass index (BMI) 29.0-29.9, adult: Secondary | ICD-10-CM | POA: Diagnosis not present

## 2022-07-09 DIAGNOSIS — E1169 Type 2 diabetes mellitus with other specified complication: Secondary | ICD-10-CM

## 2022-07-09 DIAGNOSIS — K219 Gastro-esophageal reflux disease without esophagitis: Secondary | ICD-10-CM

## 2022-07-09 DIAGNOSIS — M1990 Unspecified osteoarthritis, unspecified site: Secondary | ICD-10-CM | POA: Diagnosis not present

## 2022-07-09 DIAGNOSIS — Z794 Long term (current) use of insulin: Secondary | ICD-10-CM | POA: Diagnosis not present

## 2022-07-09 DIAGNOSIS — E785 Hyperlipidemia, unspecified: Secondary | ICD-10-CM | POA: Diagnosis not present

## 2022-07-09 LAB — POCT GLYCOSYLATED HEMOGLOBIN (HGB A1C): Hemoglobin A1C: 9.1 % — AB (ref 4.0–5.6)

## 2022-07-09 MED ORDER — TRAZODONE HCL 50 MG PO TABS: 25.0000 mg | ORAL_TABLET | Freq: Every evening | ORAL | 0 refills | Status: DC | PRN

## 2022-07-09 NOTE — Assessment & Plan Note (Signed)
POCT A1c 9.1% Urine microalbumin has been checked within the last year Encourage low-carb diet and exercise for weight loss Continue metformin, glipizide, Ozempic and Lantus She will increase Lantus by 1 unit every 3 days until fasting sugars are <100 Encouraged routine eye exam, will request copy Encouraged routine foot exam Encouraged her to get a flu shot the fall Pneumovax UTD Encouraged her to get her COVID-vaccine

## 2022-07-09 NOTE — Assessment & Plan Note (Signed)
Continue calcium and vitamin D Encourage daily weightbearing exercise

## 2022-07-09 NOTE — Patient Instructions (Signed)

## 2022-07-09 NOTE — Assessment & Plan Note (Signed)
Continue meclizine and Zofran as needed

## 2022-07-09 NOTE — Assessment & Plan Note (Signed)
Encourage diet and exercise for weight loss 

## 2022-07-09 NOTE — Assessment & Plan Note (Signed)
Persistent but sertraline not effective, she plans to wean off of this Support offered

## 2022-07-09 NOTE — Assessment & Plan Note (Signed)
Okay to take Tylenol OTC as needed for joint pain

## 2022-07-09 NOTE — Assessment & Plan Note (Signed)
Avoid foods that trigger reflux Encourage weight loss as this can produce reflux symptoms Continue pantoprazole and Pepto-Bismol as needed

## 2022-07-09 NOTE — Assessment & Plan Note (Signed)
C-Met and lipid profile today Encouraged her to consume low-fat diet Continue atorvastatin, ezetimibe and fenofibrate

## 2022-07-09 NOTE — Progress Notes (Signed)
Avoid foods that trigger reflux  Subjective:    Patient ID: [name], female    DOB: May 21, 1955, 67 y.o.   MRN: [id number]  HPI  Patient presents to clinic today for follow-up of chronic conditions.  BPPV: Intermittent, managed with Zofran and Meclizine as needed.  She follows with ENT.  OA: Mainly in her hands and shoulders.  She does not take any medications for this.  She does not follow with orthopedics.  GERD: Triggered by spicy foods.  She has occasional breakthrough on Pantoprazole for which she takes Pepto-Bismol with good relief of symptoms.  There is no upper GI on file.  HLD: Her last LDL was 83, triglycerides 375, 04/2022.  She denies myalgias on Atorvastatin, Ezetimibe and Fenofibrate.  She does not consume low-fat diet.  DM2: Her last A1c was 10.1, 04/2022.  She is taking Glipizide, Metformin, Ozempic and Lantus as prescribed.  Her sugars range 94-220.  She checks her feet routinely.  Her last eye exam was 12/2021, [facility].  Flu 01/2022.  Pneumovax 04/2022.  COVID never.  She no longer follows with endocrinology.  Insomnia: She has difficulty staying asleep.  She is taking Melatonin OTC with minimal relief of symptoms.  There is no sleep study on file.  Anxiety: Persistent, managed on Sertraline but she does not feel it is effective.  She is not currently seeing a therapist.  She denies depression, SI/HI.  Osteoporosis: She is taking taking Calcium and Vit D OTC.  She has never been treated for this. Bone density from 04/2021 reviewed.  Review of Systems     Past Medical History:  Diagnosis Date   Blood transfusion    1984   [facility]   Chronic kidney disease    occasional renal stone   Complication of anesthesia    woke during surgery (ablation) 2006?   Diabetes mellitus    type 2 Diab- A1C-7   Encounter for general adult medical examination with abnormal findings 02/02/2022   GERD (gastroesophageal reflux disease)    occasional Protonix-will take protonix night  before surgery   Hypercholesteremia    on meds    Current Outpatient Medications  Medication Sig Dispense Refill   aspirin EC 81 MG tablet Take 81 mg by mouth daily. Swallow whole.     atorvastatin (LIPITOR) 10 MG tablet Take 1 tablet (10 mg total) by mouth daily. 90 tablet 1   B-D UF III MINI PEN NEEDLES 31G X 5 MM MISC Use 4x a day 200 each 3   Blood Glucose Monitoring Suppl (ONETOUCH VERIO) w/Device KIT Use as instructed to check blood sugar 3X daily 1 kit 0   CALCIUM PO Take 1,200 mg by mouth daily.     Cholecalciferol (VITAMIN D-3) 25 MCG (1000 UT) CAPS Take 1,000 Units by mouth daily.     Continuous Blood Gluc Receiver (FREESTYLE LIBRE 2 READER) DEVI 1 each by Does not apply route daily. 1 each 0   Continuous Blood Gluc Sensor (FREESTYLE LIBRE 2 SENSOR) MISC 1 each by Does not apply route every 14 (fourteen) days. 6 each 3   ezetimibe (ZETIA) 10 MG tablet Take 1 tablet (10 mg total) by mouth daily. 90 tablet 1   fenofibrate (TRICOR) 145 MG tablet TAKE 1 TABLET BY MOUTH ONCE A DAY 90 tablet 0   glipiZIDE (GLUCOTROL) 10 MG tablet TAKE 1 TABLET (10 MG TOTAL) BY MOUTH 2 (TWO) TIMES DAILY BEFORE A MEAL. (Patient taking differently: Take 5 mg by mouth 2 (two)  times daily before a meal.) 180 tablet 3   glucose blood (ONETOUCH VERIO) test strip USE AS DIRECTED TO CHECK BLOOD SUGAR 3 TIMES A DAY 300 strip 0   Insulin Glargine Solostar (LANTUS) 100 UNIT/ML Solostar Pen INJECT 44 UNITS INTO THE SKIN AT BEDTIME. 15 mL 1   metFORMIN (GLUCOPHAGE) 1000 MG tablet Take 1 tablet (1,000 mg total) by mouth 2 (two) times daily with a meal. 180 tablet 1   Multiple Vitamins-Minerals (MULTIVITAMINS THER. W/MINERALS) TABS Take 1 tablet by mouth daily.     Omega-3 Fatty Acids (FISH OIL) 1000 MG CAPS Take 1,000 mg by mouth 2 (two) times daily.     ondansetron (ZOFRAN-ODT) 8 MG disintegrating tablet '8mg'$  ODT q4 hours prn nausea 30 tablet 0   OZEMPIC, 0.25 OR 0.5 MG/DOSE, 2 MG/3ML SOPN Inject 0.5 mg into the skin  once a week. 3 mL 2   pantoprazole (PROTONIX) 40 MG tablet Take 1 tablet (40 mg total) by mouth 2 (two) times daily. 180 tablet 1   sertraline (ZOLOFT) 50 MG tablet Take 1 tablet (50 mg total) by mouth daily. 90 tablet 0   Zinc Gluconate-Vitamin C (BL ZINC-VITAMIN C LOZENGE MT) Use as directed 1 tablet in the mouth or throat daily.     No current facility-administered medications for this visit.    Allergies  Allergen Reactions   Jardiance [Empagliflozin]     Myalgias   Sulfa Antibiotics     Other reaction(s): Edema    Family History  Problem Relation Age of Onset   Diabetes Mother    Hypertension Mother    Hyperlipidemia Mother    Heart disease Mother    Heart disease Father    Hyperlipidemia Father    Diabetes Father    Heart attack Father    Varicose Veins Father    Peripheral vascular disease Father        amputation   Cancer Paternal Aunt        lung/ brain   Diabetes Maternal Grandmother     Social History   Socioeconomic History   Marital status: Widowed    Spouse name: Not on file   Number of children: Not on file   Years of education: Not on file   Highest education level: Not on file  Occupational History   Not on file  Tobacco Use   Smoking status: Former    Packs/day: 0.75    Years: 30.00    Total pack years: 22.50    Types: Cigarettes   Smokeless tobacco: Never   Tobacco comments:    recently quit 03/01/2015  Vaping Use   Vaping Use: Never used  Substance and Sexual Activity   Alcohol use: No    Alcohol/week: 0.0 standard drinks of alcohol   Drug use: No   Sexual activity: Yes  Other Topics Concern   Not on file  Social History Narrative   Not on file   Social Determinants of Health   Financial Resource Strain: Low Risk  (04/06/2022)   Overall Financial Resource Strain (CARDIA)    Difficulty of Paying Living Expenses: Not hard at all  Food Insecurity: No Food Insecurity (04/06/2022)   Hunger Vital Sign    Worried About Running Out of  Food in the Last Year: Never true    [place] in the Last Year: Never true  Transportation Needs: No Transportation Needs (04/06/2022)   PRAPARE - Hydrologist (Medical): No  Lack of Transportation (Non-Medical): No  Physical Activity: Insufficiently Active (04/06/2022)   Exercise Vital Sign    Days of Exercise per Week: 3 days    Minutes of Exercise per Session: 30 min  Stress: No Stress Concern Present (04/06/2022)   [place]    Feeling of Stress : Only a little  Social Connections: Moderately Isolated (04/06/2022)   Social Connection and Isolation Panel [NHANES]    Frequency of Communication with Friends and Family: More than three times a week    Frequency of Social Gatherings with Friends and Family: Three times a week    Attends Religious Services: More than 4 times per year    Active Member of Clubs or Organizations: No    Attends Archivist Meetings: Never    Marital Status: Widowed  Intimate Partner Violence: Not At Risk (04/06/2022)   Humiliation, Afraid, Rape, and Kick questionnaire    Fear of Current or Ex-Partner: No    Emotionally Abused: No    Physically Abused: No    Sexually Abused: No     Constitutional: Denies fever, malaise, fatigue, headache or abrupt weight changes.  HEENT: Denies eye pain, eye redness, ear pain, ringing in the ears, wax buildup, runny nose, nasal congestion, bloody nose, or sore throat. Respiratory: Denies difficulty breathing, shortness of breath, cough or sputum production.   Cardiovascular: Denies chest pain, chest tightness, palpitations or swelling in the hands or feet.  Gastrointestinal: Patient reports intermittent reflux.  Denies abdominal pain, bloating, constipation, diarrhea or blood in the stool.  GU: Denies urgency, frequency, pain with urination, burning sensation, blood in urine, odor or discharge. Musculoskeletal:  Patient reports joint pain.  Denies decrease in range of motion, difficulty with gait, muscle pain or joint swelling.  Skin: Denies redness, rashes, lesions or ulcercations.  Neurological: Patient reports insomnia, intermittent dizziness.  Denies difficulty with memory, difficulty with speech or problems with balance and coordination.  Psych: Patient has a history of anxiety.  Denies  depression, SI/HI.  No other specific complaints in a complete review of systems (except as listed in HPI above).  Objective:   Physical Exam  BP 122/68 (BP Location: Left Arm, Patient Position: Sitting, Cuff Size: Normal)   Pulse 75   Temp (!) 96.7 F (35.9 C) (Temporal)   Wt 180 lb (81.6 kg)   SpO2 99%   BMI 29.95 kg/m   Wt Readings from Last 3 Encounters:  06/05/22 179 lb (81.2 kg)  04/06/22 188 lb (85.3 kg)  04/06/22 188 lb (85.3 kg)    General: Appears her stated age, overweight, in NAD. Skin: Warm, dry and intact. No ulcerations noted. HEENT: Head: normal shape and size; Eyes: sclera white, no icterus, conjunctiva pink, PERRLA and EOMs intact;  Cardiovascular: Normal rate and rhythm. S1,S2 noted.  No murmur, rubs or gallops noted. No JVD or BLE edema. No carotid bruits noted. Pulmonary/Chest: Normal effort and positive vesicular breath sounds. No respiratory distress. No wheezes, rales or ronchi noted.  Abdomen: Soft and nontender. Normal bowel sounds.  Musculoskeletal:  No difficulty with gait.  Neurological: Alert and oriented. Coordination normal.  Psychiatric: Mood and affect normal. Behavior is normal. Judgment and thought content normal.     BMET    Component Value Date/Time   NA 135 04/06/2022 0848   K 4.5 04/06/2022 0848   CL 99 04/06/2022 0848   CO2 24 04/06/2022 0848   GLUCOSE 218 (H) 04/06/2022 0848   BUN  18 04/06/2022 0848   CREATININE 0.75 04/06/2022 0848   CALCIUM 9.5 04/06/2022 0848   GFRNONAA 76 09/27/2016 0854   GFRAA 87 09/27/2016 0854    Lipid Panel      Component Value Date/Time   CHOL 186 04/06/2022 0848   TRIG 375 (H) 04/06/2022 0848   HDL 53 04/06/2022 0848   CHOLHDL 3.5 04/06/2022 0848   VLDL 60.8 (H) 12/17/2019 0901   LDLCALC 83 04/06/2022 0848    CBC    Component Value Date/Time   WBC 8.3 04/06/2022 0848   RBC 4.46 04/06/2022 0848   HGB 13.0 04/06/2022 0848   HCT 39.0 04/06/2022 0848   PLT 309 04/06/2022 0848   MCV 87.4 04/06/2022 0848   MCH 29.1 04/06/2022 0848   MCHC 33.3 04/06/2022 0848   RDW 12.5 04/06/2022 0848   LYMPHSABS 3,576 12/29/2021 1437   MONOABS 0.6 01/07/2014 1402   EOSABS 0 (L) 12/29/2021 1437   BASOSABS 64 12/29/2021 1437    Hgb A1C Lab Results  Component Value Date   HGBA1C 10.2 (H) 04/06/2022            Assessment & Plan:     RTC in 3 months for follow-up of chronic conditions Webb Silversmith, NP

## 2022-07-10 LAB — COMPLETE METABOLIC PANEL WITH GFR
AG Ratio: 1.8 (calc) (ref 1.0–2.5)
ALT: 21 U/L (ref 6–29)
AST: 17 U/L (ref 10–35)
Albumin: 4.3 g/dL (ref 3.6–5.1)
Alkaline phosphatase (APISO): 54 U/L (ref 37–153)
BUN: 19 mg/dL (ref 7–25)
CO2: 28 mmol/L (ref 20–32)
Calcium: 9.7 mg/dL (ref 8.6–10.4)
Chloride: 104 mmol/L (ref 98–110)
Creat: 0.71 mg/dL (ref 0.50–1.05)
Globulin: 2.4 g/dL (calc) (ref 1.9–3.7)
Glucose, Bld: 141 mg/dL — ABNORMAL HIGH (ref 65–99)
Potassium: 4.4 mmol/L (ref 3.5–5.3)
Sodium: 140 mmol/L (ref 135–146)
Total Bilirubin: 0.4 mg/dL (ref 0.2–1.2)
Total Protein: 6.7 g/dL (ref 6.1–8.1)
eGFR: 95 mL/min/{1.73_m2} (ref >=60)

## 2022-07-10 LAB — VITAMIN D 25 HYDROXY (VIT D DEFICIENCY, FRACTURES): Vit D, 25-Hydroxy: 41 ng/mL (ref 30–100)

## 2022-07-10 LAB — LIPID PANEL
Cholesterol: 185 mg/dL (ref <200)
HDL: 60 mg/dL (ref >=50)
LDL Cholesterol (Calc): 94 mg/dL (calc)
Non-HDL Cholesterol (Calc): 125 mg/dL (calc) (ref <130)
Total CHOL/HDL Ratio: 3.1 (calc) (ref <5.0)
Triglycerides: 216 mg/dL — ABNORMAL HIGH (ref <150)

## 2022-07-11 ENCOUNTER — Encounter: Payer: Self-pay | Admitting: Internal Medicine

## 2022-07-19 NOTE — Progress Notes (Signed)
Assessment/Plan:   1.  LUE rest tremor  -She currently does not meet [place] brain bank criteria for the diagnosis of Parkinson's disease.  I did tell her that we will continue to monitor for that  -[employer] offered but declined.  I agree with that decision  -She asked about how anxiety/stress participates in contributing to tremor.  We discussed that today.  It may be helpful for primary care to help mitigate stress, which sounds really like it is adjustment disorder, due to the passing of her husband and father a few years ago.  We talked about medications as well as counseling.  She is going to follow-up with primary care. 2.  Palpitations  -I am not convinced that these are inner tremor, but rather discrete episodes that are very short of palpitations that make her feel like she is coming "out of my skin."  I worry that this is a transient arrhythmia.  I told her to follow-up with primary care.  I asked her to take her pulse during these episodes.  She had not thought of that, but has been taking her blood sugar which has been normal.  She does not have an Apple Watch to monitor. 3.  I will plan on seeing her in 9 months to 1 year.  Subjective:   [name] was seen today in the movement disorders clinic for neurologic consultation at the request of Jearld Fenton, NP.  The consultation is for the evaluation of tremor.  Medical records made available to me are reviewed.    Tremor: Yes.     How long has it been going on?  3 years per pcp notes but pt notes it mostly 1 year ago   At rest or with activation?  Rest; its described as a "fine" tremor  Fam hx of tremor?  No.  Located where?  both  Affected by caffeine:  unknown (drinks 1 gallon of water with 5 packets of crystal light with caffeine in it)  Affected by alcohol:  doesn't drink any  Affected by stress:  Yes.   But she does "diamond art" to stress relieve and has no tremor since then  Affected by fatigue:  No.  Spills soup if on  spoon:  No.  Spills glass of liquid if full:  No.  Affects ADL's (tying shoes, brushing teeth, etc):  No.  Tremor inducing meds:  No.  Other Specific Symptoms:  Voice: no change Sleep: trouble getting and staying asleep  Vivid Dreams:  No.  Acting out dreams:  No. Wet Pillows: No. Postural symptoms:  some changes if she has a spell of vertigo (has what sounds like bppv since 2016)  Falls?  No. Bradykinesia symptoms: no bradykinesia noted Loss of smell:  No. Loss of taste:  No. Urinary Incontinence:  No. Difficulty Swallowing:  No. Handwriting, micrographia: No., but it's "sloppy" Trouble with ADL's:  No.  Trouble buttoning clothing: No. Depression:  -  admits to anxiety/stress since the death of her husband and dad 3 years ago Memory changes:  No. Hallucinations:  No.  visual distortions: rarely N/V:  No. Lightheaded:  only with vertigo  Syncope: No.  She describes a feeling of palpitations and strange inner feeling like she wants to come out of her skin.  It happens transiently and quickly and does not last long.  She has never tried to take her pulse during that time.  She has taken her blood sugar and it has been normal.  She had an MRI of her brain in January, 2018 with and without gadolinium that was normal.  PREVIOUS MEDICATIONS: none to date  ALLERGIES:   Allergies  Allergen Reactions   Jardiance [Empagliflozin]     Myalgias   Sulfa Antibiotics     Other reaction(s): Edema    CURRENT MEDICATIONS:  Current Outpatient Medications  Medication Instructions   aspirin EC 81 mg, Oral, Daily, Swallow whole.   atorvastatin (LIPITOR) 10 mg, Oral, Daily   B-D UF III MINI PEN NEEDLES 31G X 5 MM MISC Use 4x a day   Biotin 5 MG CAPS Oral   Blood Glucose Monitoring Suppl (ONETOUCH VERIO) w/Device KIT Use as instructed to check blood sugar 3X daily   CALCIUM PO 1,200 mg, Oral, Daily   ezetimibe (ZETIA) 10 mg, Oral, Daily   fenofibrate (TRICOR) 145 MG tablet TAKE 1 TABLET  BY MOUTH ONCE A DAY   Fish Oil 1,000 mg, Oral, 2 times daily   glipiZIDE (GLUCOTROL) 10 MG tablet TAKE 1 TABLET (10 MG TOTAL) BY MOUTH 2 (TWO) TIMES DAILY BEFORE A MEAL.   glucose blood (ONETOUCH VERIO) test strip USE AS DIRECTED TO CHECK BLOOD SUGAR 3 TIMES A DAY   Insulin Glargine Solostar (LANTUS) 100 UNIT/ML Solostar Pen INJECT 44 UNITS INTO THE SKIN AT BEDTIME.   metFORMIN (GLUCOPHAGE) 1,000 mg, Oral, 2 times daily with meals   Multiple Vitamins-Minerals (MULTIVITAMINS THER. W/MINERALS) TABS 1 tablet, Oral, Daily,     Omega-3 Fatty Acids (OMEGA-3 FISH OIL PO) Oral   ondansetron (ZOFRAN-ODT) 8 MG disintegrating tablet 8mg  ODT q4 hours prn nausea   Ozempic (0.25 or 0.5 MG/DOSE) 0.5 mg, Subcutaneous, Weekly   pantoprazole (PROTONIX) 40 mg, Oral, 2 times daily   traZODone (DESYREL) 25-50 mg, Oral, At bedtime PRN   Vitamin D-3 1,000 Units, Oral, Daily   Zinc Gluconate-Vitamin C (BL ZINC-VITAMIN C LOZENGE MT) 1 tablet, Mouth/Throat, Daily    Objective:   PHYSICAL EXAMINATION:    VITALS:   Vitals:   07/23/22 0902  BP: 127/79  Pulse: 80  SpO2: 98%  Weight: 179 lb 12.8 oz (81.6 kg)  Height: 5\' 5"  (1.651 m)    GEN:  The patient appears stated age and is in NAD. HEENT:  Normocephalic, atraumatic.  The mucous membranes are moist. The superficial temporal arteries are without ropiness or tenderness. CV:  RRR Lungs:  CTAB Neck/HEME:  There are no carotid bruits bilaterally.  Neurological examination:  Orientation: The patient is alert and oriented x3.  Cranial nerves: There is good facial symmetry.  Extraocular muscles are intact. The visual fields are full to confrontational testing. The speech is fluent and clear. Soft palate rises symmetrically and there is no tongue deviation. Hearing is intact to conversational tone. Sensation: Sensation is intact to light touch throughout (facial, trunk, extremities). Vibration is intact at the bilateral big toe. There is no extinction with  double simultaneous stimulation.  Motor: Strength is 5/5 in the bilateral upper and lower extremities.   Shoulder shrug is equal and symmetric.  There is no pronator drift. Deep tendon reflexes: Deep tendon reflexes are 2/4 at the bilateral biceps, triceps, brachioradialis, patella and achilles. Plantar responses are downgoing bilaterally.  Movement examination: Tone: There is normal tone in the bilateral upper extremities.  The tone in the lower extremities is normal.  Abnormal movements: There is very rare left upper extremity rest tremor that just slightly increases with distraction procedures.  She has no trouble with Archimedes spirals bilaterally.  No  significant postural or intention tremor. Coordination:  There is no decremation with RAM's, with any form of RAMS, including alternating supination and pronation of the forearm, hand opening and closing, finger taps, heel taps and toe taps.  Gait and Station: The patient has no difficulty arising out of a deep-seated chair without the use of the hands. The patient's stride length is good with good armswing bilaterally.  I have reviewed and interpreted the following labs independently   Chemistry      Component Value Date/Time   NA 140 07/09/2022 0906   K 4.4 07/09/2022 0906   CL 104 07/09/2022 0906   CO2 28 07/09/2022 0906   BUN 19 07/09/2022 0906   CREATININE 0.71 07/09/2022 0906      Component Value Date/Time   CALCIUM 9.7 07/09/2022 0906   ALKPHOS 57 12/17/2019 0901   AST 17 07/09/2022 0906   ALT 21 07/09/2022 0906   BILITOT 0.4 07/09/2022 0906      Lab Results  Component Value Date   TSH 1.07 02/02/2022   Lab Results  Component Value Date   WBC 8.3 04/06/2022   HGB 13.0 04/06/2022   HCT 39.0 04/06/2022   MCV 87.4 04/06/2022   PLT 309 04/06/2022      Total time spent on today's visit was 45 minutes, including both face-to-face time and nonface-to-face time.  Time included that spent on review of records (prior notes  available to me/labs/imaging if pertinent), discussing treatment and goals, answering patient's questions and coordinating care.  Cc:  Jearld Fenton, NP

## 2022-07-23 ENCOUNTER — Encounter: Payer: Self-pay | Admitting: Neurology

## 2022-07-23 ENCOUNTER — Ambulatory Visit: Payer: HMO | Admitting: Neurology

## 2022-07-23 VITALS — BP 127/79 | HR 80 | Ht 65.0 in | Wt 179.8 lb

## 2022-07-23 DIAGNOSIS — R002 Palpitations: Secondary | ICD-10-CM | POA: Diagnosis not present

## 2022-07-23 DIAGNOSIS — R251 Tremor, unspecified: Secondary | ICD-10-CM | POA: Diagnosis not present

## 2022-07-23 NOTE — Patient Instructions (Signed)
Take your pulse during your "spells" when you are having the palpitations and feel like you will run "out of your skin."  You also need to follow up with Jearld Fenton, NP about that. We will monitor your tremor.  If things get worse in the next 9 months, you can let us know It was really my pleasure to see you!  The physicians and staff at [facility] are committed to providing excellent care. You may receive a survey requesting feedback about your experience at our office. We strive to receive "very good" responses to the survey questions. If you feel that your experience would prevent you from giving the office a "very good " response, please contact our office to try to remedy the situation. We may be reached at [phone]. Thank you for taking the time out of your busy day to complete the survey.

## 2022-07-30 ENCOUNTER — Other Ambulatory Visit: Payer: Self-pay

## 2022-07-30 DIAGNOSIS — E1165 Type 2 diabetes mellitus with hyperglycemia: Secondary | ICD-10-CM

## 2022-07-30 MED ORDER — ONETOUCH VERIO VI STRP: ORAL_STRIP | 1 refills | Status: DC

## 2022-08-08 ENCOUNTER — Other Ambulatory Visit: Payer: Self-pay | Admitting: Internal Medicine

## 2022-08-08 DIAGNOSIS — E785 Hyperlipidemia, unspecified: Secondary | ICD-10-CM

## 2022-08-08 NOTE — Telephone Encounter (Signed)
Requested Prescriptions  Pending Prescriptions Disp Refills   fenofibrate (TRICOR) 145 MG tablet [Pharmacy Med Name: FENOFIBRATE 145 MG TABLET] 90 tablet 0    Sig: TAKE 1 TABLET BY MOUTH EVERY DAY     Cardiovascular:  Antilipid - Fibric Acid Derivatives Failed - 08/08/2022  2:26 AM      Failed - Lipid Panel in normal range within the last 12 months    Cholesterol  Date Value Ref Range Status  07/09/2022 185 <200 mg/dL Final   LDL Cholesterol (Calc)  Date Value Ref Range Status  07/09/2022 94 mg/dL (calc) Final    Comment:    Reference range: <100 . Desirable range <100 mg/dL for primary prevention;   <70 mg/dL for patients with CHD or diabetic patients  with > or = 2 CHD risk factors. Marland Kitchen LDL-C is now calculated using the Martin-Hopkins  calculation, which is a validated novel method providing  better accuracy than the Friedewald equation in the  estimation of LDL-C.  Cresenciano Genre et al. Annamaria Helling. [id number]): 2061-2068  (http://education.[facility].com/faq/FAQ164)    Direct LDL  Date Value Ref Range Status  12/17/2019 63.0 mg/dL Final    Comment:    Optimal:  <100 mg/dLNear or Above Optimal:  100-129 mg/dLBorderline High:  130-159 mg/dLHigh:  160-189 mg/dLVery High:  >190 mg/dL   HDL  Date Value Ref Range Status  07/09/2022 60 > OR = 50 mg/dL Final   Triglycerides  Date Value Ref Range Status  07/09/2022 216 (H) <150 mg/dL Final    Comment:    . If a non-fasting specimen was collected, consider repeat triglyceride testing on a fasting specimen if clinically indicated.  Yates Decamp et al. J. of Clin. Lipidol. [id number]. Marland Kitchen          Passed - ALT in normal range and within 360 days    ALT  Date Value Ref Range Status  07/09/2022 21 6 - 29 U/L Final         Passed - AST in normal range and within 360 days    AST  Date Value Ref Range Status  07/09/2022 17 10 - 35 U/L Final         Passed - Cr in normal range and within 360 days    Creat  Date Value Ref  Range Status  07/09/2022 0.71 0.50 - 1.05 mg/dL Final   Creatinine,U  Date Value Ref Range Status  12/15/2018 61.2 mg/dL Final   Creatinine, Urine  Date Value Ref Range Status  04/06/2022 75 20 - 275 mg/dL Final         Passed - HGB in normal range and within 360 days    Hemoglobin  Date Value Ref Range Status  04/06/2022 13.0 11.7 - 15.5 g/dL Final         Passed - HCT in normal range and within 360 days    HCT  Date Value Ref Range Status  04/06/2022 39.0 35.0 - 45.0 % Final         Passed - PLT in normal range and within 360 days    Platelets  Date Value Ref Range Status  04/06/2022 309 140 - 400 Thousand/uL Final         Passed - WBC in normal range and within 360 days    WBC  Date Value Ref Range Status  04/06/2022 8.3 3.8 - 10.8 Thousand/uL Final         Passed - eGFR is 30 or above and within 360 days  GFR, Est African American  Date Value Ref Range Status  09/27/2016 87 >=60 mL/min Final   GFR, Est Non African American  Date Value Ref Range Status  09/27/2016 76 >=60 mL/min Final   GFR  Date Value Ref Range Status  12/17/2019 65.14 >60.00 mL/min Final   eGFR  Date Value Ref Range Status  07/09/2022 95 > OR = 60 mL/min/1.3m2 Final         Passed - Valid encounter within last 12 months    Recent Outpatient Visits           1 month ago Localized osteoporosis without current pathological fracture   [facility] [place], Coralie Keens, NP   2 months ago [facility] [place], Coralie Keens, NP   2 months ago Type 2 diabetes mellitus with hyperglycemia, with long-term current use of insulin ([place])   [facility], Grayland Ormond A, RPH-CPP   4 months ago Encounter for general adult medical examination with abnormal findings   [facility] [place], Coralie Keens, NP   4 months ago Type 2 diabetes mellitus with hyperglycemia, with long-term current use  of insulin ([place])   [facility] D[facility], Grayland Ormond A, RPH-CPP               OZEMPIC, 0.25 OR 0.5 MG/DOSE, 2 MG/3ML SOPN [Pharmacy Med Name: OZEMPIC 0.25-0.5 MG/DOSE PEN] 3 mL 2    Sig: INJECT 0.5 MG INTO THE SKIN ONE TIME PER WEEK     Endocrinology:  Diabetes - GLP-1 Receptor Agonists - semaglutide Failed - 08/08/2022  2:26 AM      Failed - HBA1C in normal range and within 180 days    Hemoglobin A1C  Date Value Ref Range Status  07/09/2022 9.1 (A) 4.0 - 5.6 % Final   Hgb A1c MFr Bld  Date Value Ref Range Status  04/06/2022 10.2 (H) <5.7 % of total Hgb Final    Comment:    For someone without known diabetes, a hemoglobin A1c value of 6.5% or greater indicates that they may have  diabetes and this should be confirmed with a follow-up  test. . For someone with known diabetes, a value <7% indicates  that their diabetes is well controlled and a value  greater than or equal to 7% indicates suboptimal  control. A1c targets should be individualized based on  duration of diabetes, age, comorbid conditions, and  other considerations. . Currently, no consensus exists regarding use of hemoglobin A1c for diagnosis of diabetes for children. .          Passed - Cr in normal range and within 360 days    Creat  Date Value Ref Range Status  07/09/2022 0.71 0.50 - 1.05 mg/dL Final   Creatinine,U  Date Value Ref Range Status  12/15/2018 61.2 mg/dL Final   Creatinine, Urine  Date Value Ref Range Status  04/06/2022 75 20 - 275 mg/dL Final         Passed - Valid encounter within last 6 months    Recent Outpatient Visits           1 month ago Localized osteoporosis without current pathological fracture   [facility] [place], Coralie Keens, NP   2 months ago [facility] [place], [place] W, NP   2 months ago Type 2 diabetes mellitus  with hyperglycemia, with long-term current use of insulin [facility])    Jarratt, RPH-CPP   4 months ago Encounter for general adult medical examination with abnormal findings   [facility] [place], [place] W, NP   4 months ago Type 2 diabetes mellitus with hyperglycemia, with long-term current use of insulin [facility])   [facility] D[facility], Virl Diamond, RPH-CPP

## 2022-08-13 ENCOUNTER — Telehealth: Payer: PPO

## 2022-08-25 ENCOUNTER — Other Ambulatory Visit: Payer: Self-pay | Admitting: Internal Medicine

## 2022-08-27 NOTE — Telephone Encounter (Signed)
Requested medication (s) are due for refill today: Yes  Requested medication (s) are on the active medication list: Yes  Last refill:  07/05/22  Future visit scheduled: Yes  Notes to clinic:  Pharmacy requests 90 day supply.    Requested Prescriptions  Pending Prescriptions Disp Refills   LANTUS SOLOSTAR 100 UNIT/ML Solostar Pen [Pharmacy Med Name: LANTUS SOLOSTAR 100 UNIT/ML]  1    Sig: INJECT 44 UNITS INTO THE SKIN AT BEDTIME.     Endocrinology:  Diabetes - Insulins Failed - 08/25/2022  2:32 PM      Failed - HBA1C is between 0 and 7.9 and within 180 days    Hemoglobin A1C  Date Value Ref Range Status  07/09/2022 9.1 (A) 4.0 - 5.6 % Final   Hgb A1c MFr Bld  Date Value Ref Range Status  04/06/2022 10.2 (H) <5.7 % of total Hgb Final    Comment:    For someone without known diabetes, a hemoglobin A1c value of 6.5% or greater indicates that they may have  diabetes and this should be confirmed with a follow-up  test. . For someone with known diabetes, a value <7% indicates  that their diabetes is well controlled and a value  greater than or equal to 7% indicates suboptimal  control. A1c targets should be individualized based on  duration of diabetes, age, comorbid conditions, and  other considerations. . Currently, no consensus exists regarding use of hemoglobin A1c for diagnosis of diabetes for children. Verna Czech - Valid encounter within last 6 months    Recent Outpatient Visits           1 month ago Localized osteoporosis without current pathological fracture   [facility] [place], Salvadore Oxford, NP   2 months ago [facility] [place], [place] W, NP   3 months ago Type 2 diabetes mellitus with hyperglycemia, with long-term current use of insulin [facility])   [place] [facility] Delles, Jackelyn Poling, RPH-CPP   4 months ago Encounter for general adult medical examination with abnormal  findings   [place] [facility] [place], [place] W, NP   5 months ago Type 2 diabetes mellitus with hyperglycemia, with long-term current use of insulin [facility])   [place] [facility] Delles, Jackelyn Poling, RPH-CPP

## 2022-09-15 ENCOUNTER — Other Ambulatory Visit: Payer: Self-pay | Admitting: Internal Medicine

## 2022-09-16 ENCOUNTER — Other Ambulatory Visit: Payer: Self-pay | Admitting: Internal Medicine

## 2022-09-17 NOTE — Telephone Encounter (Signed)
Requested Prescriptions  Pending Prescriptions Disp Refills   pantoprazole (PROTONIX) 40 MG tablet [Pharmacy Med Name: PANTOPRAZOLE SOD DR 40 MG TAB] 180 tablet 1    Sig: TAKE 1 TABLET BY MOUTH TWICE A DAY     Gastroenterology: Proton Pump Inhibitors Passed - 09/16/2022  9:17 AM      Passed - Valid encounter within last 12 months    Recent Outpatient Visits           2 months ago Localized osteoporosis without current pathological fracture   [place] [facility] [place], Salvadore [place], NP   3 months ago [facility] [place], [place] W, NP   4 months ago Type 2 diabetes mellitus with hyperglycemia, with long-term current use of insulin [facility])   [place] [facility] Delles, Jackelyn Poling, RPH-CPP   5 months ago Encounter for general adult medical examination with abnormal findings   [place] [facility] [place], [place] W, NP   5 months ago Type 2 diabetes mellitus with hyperglycemia, with long-term current use of insulin [facility])   [facility] Delles, Gentry Fitz A, RPH-CPP

## 2022-09-17 NOTE — Telephone Encounter (Signed)
Requested Prescriptions  Pending Prescriptions Disp Refills   ezetimibe (ZETIA) 10 MG tablet [Pharmacy Med Name: EZETIMIBE 10 MG TABLET] 90 tablet 3    Sig: TAKE 1 TABLET BY MOUTH EVERY DAY     Cardiovascular:  Antilipid - Sterol Transport Inhibitors Failed - 09/15/2022  9:19 AM      Failed - Lipid Panel in normal range within the last 12 months    Cholesterol  Date Value Ref Range Status  07/09/2022 185 <200 mg/dL Final   LDL Cholesterol (Calc)  Date Value Ref Range Status  07/09/2022 94 mg/dL (calc) Final    Comment:    Reference range: <100 . Desirable range <100 mg/dL for primary prevention;   <70 mg/dL for patients with CHD or diabetic patients  with > or = 2 CHD risk factors. Marland Kitchen LDL-C is now calculated using the Martin-Hopkins  calculation, which is a validated novel method providing  better accuracy than the Friedewald equation in the  estimation of LDL-C.  Horald Pollen et al. Lenox Ahr. 6295;284(13): 2061-2068  (http://education.[facility].com/faq/FAQ164)    Direct LDL  Date Value Ref Range Status  12/17/2019 63.0 mg/dL Final    Comment:    Optimal:  <100 mg/dLNear or Above Optimal:  100-129 mg/dLBorderline High:  130-159 mg/dLHigh:  160-189 mg/dLVery High:  >190 mg/dL   HDL  Date Value Ref Range Status  07/09/2022 60 > OR = 50 mg/dL Final   Triglycerides  Date Value Ref Range Status  07/09/2022 216 (H) <150 mg/dL Final    Comment:    . If a non-fasting specimen was collected, consider repeat triglyceride testing on a fasting specimen if clinically indicated.  Perry Mount et al. J. of Clin. Lipidol. 2015;9:129-169. Marland Kitchen          Passed - AST in normal range and within 360 days    AST  Date Value Ref Range Status  07/09/2022 17 10 - 35 U/L Final         Passed - ALT in normal range and within 360 days    ALT  Date Value Ref Range Status  07/09/2022 21 6 - 29 U/L Final         Passed - Patient is not pregnant      Passed - Valid encounter within last 12  months    Recent Outpatient Visits           2 months ago Localized osteoporosis without current pathological fracture   [place] [place], Salvadore Oxford, NP   3 months ago [employer]   [facility] [place], [place] W, NP   4 months ago Type 2 diabetes mellitus with hyperglycemia, with long-term current use of insulin [facility])   [place] [facility] D[facility], Gentry Fitz A, RPH-CPP   5 months ago Encounter for general adult medical examination with abnormal findings   [place] [facility] [place], [place] W, NP   5 months ago Type 2 diabetes mellitus with hyperglycemia, with long-term current use of insulin [facility])   [place] [facility] Delles, Gentry Fitz A, RPH-CPP

## 2022-10-04 ENCOUNTER — Other Ambulatory Visit: Payer: Self-pay | Admitting: Internal Medicine

## 2022-10-04 ENCOUNTER — Ambulatory Visit: Payer: Self-pay

## 2022-10-04 NOTE — Telephone Encounter (Signed)
  Chief Complaint: nose sores Symptoms: sores in both nostrils, raw areas of skin Frequency: several weeks Pertinent Negatives: NA Disposition: [] ED /[] Urgent Care (no appt availability in office) / [x] Appointment(In office/virtual)/ []  [facility]/ [] Home Care/ [] Refused Recommended Disposition /[] [facility]/ []  Follow-up with PCP Additional Notes: pt states she had a cold about 1 month ago and isn't sure if she scratched the inside of both nostrils or sores have developed but she has been applying Vaseline every night and when she blows her nose the areas open back up and are painful. Pt tried using nasal spray that pharmacy told her about and it didn't do anything but burn so pt quit using. Scheduled VV tomorrow at 1120 with PCP.   Summary: cold sores in nose   Pt called in , says has cold sores in her nose and hurts and sore when she blows her nose, and is asking for something to clear it up and sent to pharmacy. She didn't really want to have to make an appt, since going out of town soon. [id number] [address] Sheen, [place] - [address] Phone: [phone] Fax: [phone]     Reason for Disposition  Sores last > 2 weeks  Answer Assessment - Initial Assessment Questions 1. APPEARANCE of BLISTERS: "Describe the sores."     Cold sores  2. SIZE: "How large an area is involved with the cold sores?" (e.g., inches, cm or compare to coins)     Small areas  3. LOCATION: "Which part of the lip is involved?"     In nostrils on both sides 4. ONSET: "When did the fever blisters begin?"     Ongoing since had cold last month  6. OTHER SYMPTOMS: "Do you have any other symptoms?" (e.g., fever, sores inside mouth)  Protocols used: Cold Sores (Fever Blisters)-A-AH

## 2022-10-04 NOTE — Telephone Encounter (Signed)
Requested Prescriptions  Pending Prescriptions Disp Refills   traZODone (DESYREL) 50 MG tablet [Pharmacy Med Name: TRAZODONE 50 MG TABLET] 90 tablet 0    Sig: TAKE 0.5-1 TABLETS BY MOUTH AT BEDTIME AS NEEDED FOR SLEEP.     Psychiatry: Antidepressants - Serotonin Modulator Passed - 10/04/2022  2:41 AM      Passed - Valid encounter within last 6 months    Recent Outpatient Visits           2 months ago Localized osteoporosis without current pathological fracture   [facility] [place], Salvadore Oxford, NP   4 months ago [facility] [place], [place] W, NP   4 months ago Type 2 diabetes mellitus with hyperglycemia, with long-term current use of insulin [facility])   [place] [facility] [place], Jackelyn Poling, RPH-CPP   6 months ago Encounter for general adult medical examination with abnormal findings   [facility] [place], [place] W, NP   6 months ago Type 2 diabetes mellitus with hyperglycemia, with long-term current use of insulin [facility])   [place] [facility] [place], Jackelyn Poling, RPH-CPP       Future Appointments             Tomorrow Baity, Salvadore Oxford, NP [facility], [facility]

## 2022-10-05 ENCOUNTER — Encounter: Payer: Self-pay | Admitting: Internal Medicine

## 2022-10-05 ENCOUNTER — Telehealth (INDEPENDENT_AMBULATORY_CARE_PROVIDER_SITE_OTHER): Payer: PPO | Admitting: Internal Medicine

## 2022-10-05 DIAGNOSIS — J0120 Acute ethmoidal sinusitis, unspecified: Secondary | ICD-10-CM

## 2022-10-05 DIAGNOSIS — J3489 Other specified disorders of nose and nasal sinuses: Secondary | ICD-10-CM

## 2022-10-05 MED ORDER — AMOXICILLIN-POT CLAVULANATE 875-125 MG PO TABS: 1.0000 | ORAL_TABLET | Freq: Two times a day (BID) | ORAL | 0 refills | Status: DC

## 2022-10-05 MED ORDER — MUPIROCIN 2 % EX OINT: 1.0000 | TOPICAL_OINTMENT | Freq: Two times a day (BID) | CUTANEOUS | 0 refills | Status: DC

## 2022-10-05 NOTE — Progress Notes (Signed)
Virtual Visit via Video Note  I connected with [name] on 10/05/22 at 11:20 AM EDT by a video enabled telemedicine application and verified that I am speaking with the correct person using two identifiers.  Location: Patient: Home Provider: Office  Persons participating in this video call: Nicki Reaper, NP and Mosie Epstein   I discussed the limitations of evaluation and management by telemedicine and the availability of in person appointments. The patient expressed understanding and agreed to proceed.  History of Present Illness:  Patient reports headaches, sinus pressure, runny nose, nasal congestion, and sores in her nose.  She noticed this about 1 month ago but worsened in the last week. The headache is located at the top of her nasal bridge. She is blowing bloody mucous out of her nose. She denies ear pain, sore throat, cough or shortness of breath. She denies fever, chills or body aches. She has tried Neosporin, saline nasal spray OTC with minimal relief of symptoms.   Past Medical History:  Diagnosis Date   Blood transfusion    1984   [facility]   Chronic kidney disease    occasional renal stone   Complication of anesthesia    woke during surgery (ablation) 2006?   Diabetes mellitus    type 2 Diab- A1C-7   Encounter for general adult medical examination with abnormal findings 02/02/2022   GERD (gastroesophageal reflux disease)    occasional Protonix-will take protonix night before surgery   Hypercholesteremia    on meds    Current Outpatient Medications  Medication Sig Dispense Refill   aspirin EC 81 MG tablet Take 81 mg by mouth daily. Swallow whole.     atorvastatin (LIPITOR) 10 MG tablet Take 1 tablet (10 mg total) by mouth daily. 90 tablet 1   B-D UF III MINI PEN NEEDLES 31G X 5 MM MISC Use 4x a day 200 each 3   Biotin 5 MG CAPS Take by mouth.     Blood Glucose Monitoring Suppl (ONETOUCH VERIO) w/Device KIT Use as instructed to check blood sugar 3X daily 1 kit 0    CALCIUM PO Take 1,200 mg by mouth daily.     Cholecalciferol (VITAMIN D-3) 25 MCG (1000 UT) CAPS Take 1,000 Units by mouth daily.     ezetimibe (ZETIA) 10 MG tablet TAKE 1 TABLET BY MOUTH EVERY DAY 90 tablet 3   fenofibrate (TRICOR) 145 MG tablet TAKE 1 TABLET BY MOUTH EVERY DAY 90 tablet 1   glipiZIDE (GLUCOTROL) 10 MG tablet TAKE 1 TABLET (10 MG TOTAL) BY MOUTH 2 (TWO) TIMES DAILY BEFORE A MEAL. (Patient taking differently: Take 5 mg by mouth 2 (two) times daily before a meal.) 180 tablet 3   glucose blood (ONETOUCH VERIO) test strip USE AS DIRECTED TO CHECK BLOOD SUGAR 3 TIMES A DAY 300 strip 1   insulin glargine (LANTUS SOLOSTAR) 100 UNIT/ML Solostar Pen Inject 44 Units into the skin daily. 45 mL 0   metFORMIN (GLUCOPHAGE) 1000 MG tablet Take 1 tablet (1,000 mg total) by mouth 2 (two) times daily with a meal. 180 tablet 1   Multiple Vitamins-Minerals (MULTIVITAMINS THER. W/MINERALS) TABS Take 1 tablet by mouth daily.     Omega-3 Fatty Acids (FISH OIL) 1000 MG CAPS Take 1,000 mg by mouth 2 (two) times daily.     Omega-3 Fatty Acids (OMEGA-3 FISH OIL PO) Take by mouth.     ondansetron (ZOFRAN-ODT) 8 MG disintegrating tablet 8mg  ODT q4 hours prn nausea 30 tablet 0  OZEMPIC, 0.25 OR 0.5 MG/DOSE, 2 MG/3ML SOPN INJECT 0.5 MG INTO THE SKIN ONE TIME PER WEEK 3 mL 2   pantoprazole (PROTONIX) 40 MG tablet TAKE 1 TABLET BY MOUTH TWICE A DAY 180 tablet 1   traZODone (DESYREL) 50 MG tablet TAKE 0.5-1 TABLETS BY MOUTH AT BEDTIME AS NEEDED FOR SLEEP. 90 tablet 0   Zinc Gluconate-Vitamin C (BL ZINC-VITAMIN C LOZENGE MT) Use as directed 1 tablet in the mouth or throat daily.     No current facility-administered medications for this visit.    Allergies  Allergen Reactions   Jardiance [Empagliflozin]     Myalgias   Sulfa Antibiotics     Other reaction(s): Edema    Family History  Problem Relation Age of Onset   Diabetes Mother    Hypertension Mother    Hyperlipidemia Mother    Heart disease Mother     Heart disease Father    Hyperlipidemia Father    Diabetes Father    Heart attack Father    Varicose Veins Father    Peripheral vascular disease Father        amputation   Cancer Paternal Aunt        lung/ brain   Diabetes Maternal Grandmother     Social History   Socioeconomic History   Marital status: Widowed    Spouse name: Not on file   Number of children: Not on file   Years of education: Not on file   Highest education level: Not on file  Occupational History   Not on file  Tobacco Use   Smoking status: Former    Packs/day: 0.75    Years: 30.00    Additional pack years: 0.00    Total pack years: 22.50    Types: Cigarettes   Smokeless tobacco: Never   Tobacco comments:    recently quit 03/01/2015  Vaping Use   Vaping Use: Never used  Substance and Sexual Activity   Alcohol use: No    Alcohol/week: 0.0 standard drinks of alcohol   Drug use: No   Sexual activity: Yes  Other Topics Concern   Not on file  Social History Narrative   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   Financial Resource Strain: Low Risk  (04/06/2022)   Overall Financial Resource Strain (CARDIA)    Difficulty of Paying Living Expenses: Not hard at all  Food Insecurity: No Food Insecurity (04/06/2022)   Hunger Vital Sign    Worried About Running Out of Food in the Last Year: Never true    Ran Out of Food in the Last Year: Never true  Transportation Needs: No Transportation Needs (04/06/2022)   PRAPARE - Administrator, Civil Service (Medical): No    Lack of Transportation (Non-Medical): No  Physical Activity: Insufficiently Active (04/06/2022)   Exercise Vital Sign    Days of Exercise per Week: 3 days    Minutes of Exercise per Session: 30 min  Stress: No Stress Concern Present (04/06/2022)   [employer] of Occupational Health - Occupational Stress Questionnaire    Feeling of Stress : Only a little  Social Connections: Moderately Isolated (04/06/2022)    Social Connection and Isolation Panel [NHANES]    Frequency of Communication with Friends and Family: More than three times a week    Frequency of Social Gatherings with Friends and Family: Three times a week    Attends Religious Services: More than 4 times per year  Active Member of Clubs or Organizations: No    Attends Banker Meetings: Never    Marital Status: Widowed  Intimate Partner Violence: Not At Risk (04/06/2022)   Humiliation, Afraid, Rape, and Kick questionnaire    Fear of Current or Ex-Partner: No    Emotionally Abused: No    Physically Abused: No    Sexually Abused: No     Constitutional: Pt reports headache. Denies fever, malaise, fatigue, or abrupt weight changes.  HEENT: Pt reports sinus pressure, runny nose, nasal congestion and sores in throat. Denies eye pain, eye redness, ear pain, ringing in the ears, wax buildup, bloody nose, or sore throat. Respiratory: Denies difficulty breathing, shortness of breath, cough or sputum production.   Cardiovascular: Denies chest pain, chest tightness, palpitations or swelling in the hands or feet.  Gastrointestinal: Denies abdominal pain, bloating, constipation, diarrhea or blood in the stool.   No other specific complaints in a complete review of systems (except as listed in HPI above).  Observations/Objective:   Wt Readings from Last 3 Encounters:  07/23/22 179 lb 12.8 oz (81.6 kg)  07/09/22 180 lb (81.6 kg)  06/05/22 179 lb (81.2 kg)    General: Appears her stated age, well developed, well nourished in NAD. HEENT: Head: normal shape and size, she points to the ethmoid sinus as her sight of pain; Nose: unable to visualize nasal lesion ;  Pulmonary/Chest: Normal effort. No respiratory distress.   BMET    Component Value Date/Time   NA 140 07/09/2022 0906   K 4.4 07/09/2022 0906   CL 104 07/09/2022 0906   CO2 28 07/09/2022 0906   GLUCOSE 141 (H) 07/09/2022 0906   BUN 19 07/09/2022 0906   CREATININE  0.71 07/09/2022 0906   CALCIUM 9.7 07/09/2022 0906   GFRNONAA 76 09/27/2016 0854   GFRAA 87 09/27/2016 0854    Lipid Panel     Component Value Date/Time   CHOL 185 07/09/2022 0906   TRIG 216 (H) 07/09/2022 0906   HDL 60 07/09/2022 0906   CHOLHDL 3.1 07/09/2022 0906   VLDL 60.8 (H) 12/17/2019 0901   LDLCALC 94 07/09/2022 0906    CBC    Component Value Date/Time   WBC 8.3 04/06/2022 0848   RBC 4.46 04/06/2022 0848   HGB 13.0 04/06/2022 0848   HCT 39.0 04/06/2022 0848   PLT 309 04/06/2022 0848   MCV 87.4 04/06/2022 0848   MCH 29.1 04/06/2022 0848   MCHC 33.3 04/06/2022 0848   RDW 12.5 04/06/2022 0848   LYMPHSABS 3,576 12/29/2021 1437   MONOABS 0.6 01/07/2014 1402   EOSABS 0 (L) 12/29/2021 1437   BASOSABS 64 12/29/2021 1437    Hgb A1C Lab Results  Component Value Date   HGBA1C 9.1 (A) 07/09/2022       Assessment and Plan:  Acute Ethmoidal Sinusitis, Nasal Sore:  Encourage rest and fluids Can use a neti pot which can be purchased from your local pharmacy Rx for Augmentin 875-125 mg twice daily x 10 days Rx for Mupirocin ointment twice daily  RTC in 1 week for follow-up of chronic conditions  Follow Up Instructions:    I discussed the assessment and treatment plan with the patient. The patient was provided an opportunity to ask questions and all were answered. The patient agreed with the plan and demonstrated an understanding of the instructions.   The patient was advised to call back or seek an in-person evaluation if the symptoms worsen or if the condition fails to improve  as anticipated.   Nicki Reaper, NP

## 2022-10-05 NOTE — Patient Instructions (Signed)

## 2022-10-12 ENCOUNTER — Encounter: Payer: Self-pay | Admitting: Internal Medicine

## 2022-10-12 ENCOUNTER — Ambulatory Visit (INDEPENDENT_AMBULATORY_CARE_PROVIDER_SITE_OTHER): Payer: PPO | Admitting: Internal Medicine

## 2022-10-12 VITALS — BP 134/72 | HR 77 | Temp 95.3°F | Wt 179.0 lb

## 2022-10-12 DIAGNOSIS — E1165 Type 2 diabetes mellitus with hyperglycemia: Secondary | ICD-10-CM | POA: Diagnosis not present

## 2022-10-12 DIAGNOSIS — E785 Hyperlipidemia, unspecified: Secondary | ICD-10-CM | POA: Diagnosis not present

## 2022-10-12 DIAGNOSIS — K219 Gastro-esophageal reflux disease without esophagitis: Secondary | ICD-10-CM | POA: Diagnosis not present

## 2022-10-12 DIAGNOSIS — E1169 Type 2 diabetes mellitus with other specified complication: Secondary | ICD-10-CM

## 2022-10-12 DIAGNOSIS — M1990 Unspecified osteoarthritis, unspecified site: Secondary | ICD-10-CM | POA: Diagnosis not present

## 2022-10-12 DIAGNOSIS — E663 Overweight: Secondary | ICD-10-CM | POA: Diagnosis not present

## 2022-10-12 DIAGNOSIS — Z794 Long term (current) use of insulin: Secondary | ICD-10-CM

## 2022-10-12 DIAGNOSIS — F5104 Psychophysiologic insomnia: Secondary | ICD-10-CM

## 2022-10-12 DIAGNOSIS — F419 Anxiety disorder, unspecified: Secondary | ICD-10-CM

## 2022-10-12 DIAGNOSIS — Z6829 Body mass index (BMI) 29.0-29.9, adult: Secondary | ICD-10-CM

## 2022-10-12 DIAGNOSIS — Z1231 Encounter for screening mammogram for malignant neoplasm of breast: Secondary | ICD-10-CM | POA: Diagnosis not present

## 2022-10-12 DIAGNOSIS — M816 Localized osteoporosis [Lequesne]: Secondary | ICD-10-CM

## 2022-10-12 DIAGNOSIS — H8113 Benign paroxysmal vertigo, bilateral: Secondary | ICD-10-CM

## 2022-10-12 LAB — POCT GLYCOSYLATED HEMOGLOBIN (HGB A1C): HbA1c POC (<> result, manual entry): 8 % (ref 4.0–5.6)

## 2022-10-12 MED ORDER — RYBELSUS 7 MG PO TABS: 1.0000 | ORAL_TABLET | Freq: Every day | ORAL | 0 refills | Status: DC

## 2022-10-12 NOTE — Patient Instructions (Signed)

## 2022-10-12 NOTE — Assessment & Plan Note (Signed)
POCT A1c 8% Urine microalbumin has been checked within the last year Encourage low-carb diet and exercise for weight loss Continue metformin, glipizide and Lantus She would like to change Ozempic to Rybelsus due to cramping and nausea Encouraged routine eye exam Card routine foot exam

## 2022-10-12 NOTE — Assessment & Plan Note (Signed)
Avoid foods that trigger reflux Encouraged weight loss as this can help reduce reflux symptoms Continue pantoprazole 

## 2022-10-12 NOTE — Assessment & Plan Note (Signed)
Continue meclizine and Zofran as needed 

## 2022-10-12 NOTE — Progress Notes (Signed)
Subjective:    Patient ID: [name], female    DOB: 1955/11/15, 67 y.o.   MRN: [id number]  HPI  Patient presents to clinic today for 10-month follow-up of chronic conditions.  BPPV: Intermittent, managed with Zofran and Meclizine as needed.  She follows with ENT.  OA: Mainly in her hands and shoulders.  She is not currently taking any medications for this.  She does not follow with orthopedics.  GERD: Triggered by spicy foods.  She denies breakthrough on Pantoprazole when she takes it 2 x daily.  There is no upper GI on file.  HLD: Her last LDL was 94, triglycerides 409, 07/2022.  She denies myalgias on Atorvastatin, Ezetimibe and Fenofibrate.  She does not consume low-fat diet.  DM 2: Her last A1c was 9.1%, 07/2022.  She is taking Glipizide, Metformin, Ozempic and Lantus as prescribed.  Her sugars range 60-130.  She checks her feet routinely.  Her last eye exam was 12/2021.  Flu 01/2022.  Pneumovax 04/2022.  COVID never.  She does not follow with endocrinology.  Insomnia: She has difficulty staying asleep.  She is taking Trazodone as prescribed.  There is no sleep study on file.  Anxiety: Chronic but she is not currently taking any medications for this.  She is not currently seeing a therapist.  She denies depression, SI/HI.  Osteoporosis: She is taking Calcium and Vitamin D OTC.  Bone density from 04/2021 reviewed.  Review of Systems     Past Medical History:  Diagnosis Date   Blood transfusion    1984   [facility]   Chronic kidney disease    occasional renal stone   Complication of anesthesia    woke during surgery (ablation) 2006?   Diabetes mellitus    type 2 Diab- A1C-7   Encounter for general adult medical examination with abnormal findings 02/02/2022   GERD (gastroesophageal reflux disease)    occasional Protonix-will take protonix night before surgery   Hypercholesteremia    on meds    Current Outpatient Medications  Medication Sig Dispense Refill    amoxicillin-clavulanate (AUGMENTIN) 875-125 MG tablet Take 1 tablet by mouth 2 (two) times daily. 20 tablet 0   aspirin EC 81 MG tablet Take 81 mg by mouth daily. Swallow whole.     atorvastatin (LIPITOR) 10 MG tablet Take 1 tablet (10 mg total) by mouth daily. 90 tablet 1   B-D UF III MINI PEN NEEDLES 31G X 5 MM MISC Use 4x a day 200 each 3   Biotin 5 MG CAPS Take by mouth.     Blood Glucose Monitoring Suppl (ONETOUCH VERIO) w/Device KIT Use as instructed to check blood sugar 3X daily 1 kit 0   CALCIUM PO Take 1,200 mg by mouth daily.     Cholecalciferol (VITAMIN D-3) 25 MCG (1000 UT) CAPS Take 1,000 Units by mouth daily.     ezetimibe (ZETIA) 10 MG tablet TAKE 1 TABLET BY MOUTH EVERY DAY 90 tablet 3   fenofibrate (TRICOR) 145 MG tablet TAKE 1 TABLET BY MOUTH EVERY DAY 90 tablet 1   glipiZIDE (GLUCOTROL) 10 MG tablet TAKE 1 TABLET (10 MG TOTAL) BY MOUTH 2 (TWO) TIMES DAILY BEFORE A MEAL. (Patient taking differently: Take 5 mg by mouth 2 (two) times daily before a meal.) 180 tablet 3   glucose blood (ONETOUCH VERIO) test strip USE AS DIRECTED TO CHECK BLOOD SUGAR 3 TIMES A DAY 300 strip 1   insulin glargine (LANTUS SOLOSTAR) 100 UNIT/ML Solostar Pen Inject  44 Units into the skin daily. 45 mL 0   metFORMIN (GLUCOPHAGE) 1000 MG tablet Take 1 tablet (1,000 mg total) by mouth 2 (two) times daily with a meal. 180 tablet 1   Multiple Vitamins-Minerals (MULTIVITAMINS THER. W/MINERALS) TABS Take 1 tablet by mouth daily.     mupirocin ointment (BACTROBAN) 2 % Apply 1 Application topically 2 (two) times daily. 22 g 0   Omega-3 Fatty Acids (FISH OIL) 1000 MG CAPS Take 1,000 mg by mouth 2 (two) times daily.     Omega-3 Fatty Acids (OMEGA-3 FISH OIL PO) Take by mouth.     ondansetron (ZOFRAN-ODT) 8 MG disintegrating tablet 8mg  ODT q4 hours prn nausea 30 tablet 0   OZEMPIC, 0.25 OR 0.5 MG/DOSE, 2 MG/3ML SOPN INJECT 0.5 MG INTO THE SKIN ONE TIME PER WEEK 3 mL 2   pantoprazole (PROTONIX) 40 MG tablet TAKE 1  TABLET BY MOUTH TWICE A DAY 180 tablet 1   traZODone (DESYREL) 50 MG tablet TAKE 0.5-1 TABLETS BY MOUTH AT BEDTIME AS NEEDED FOR SLEEP. 90 tablet 0   Zinc Gluconate-Vitamin C (BL ZINC-VITAMIN C LOZENGE MT) Use as directed 1 tablet in the mouth or throat daily.     No current facility-administered medications for this visit.    Allergies  Allergen Reactions   Jardiance [Empagliflozin]     Myalgias   Sulfa Antibiotics     Other reaction(s): Edema    Family History  Problem Relation Age of Onset   Diabetes Mother    Hypertension Mother    Hyperlipidemia Mother    Heart disease Mother    Heart disease Father    Hyperlipidemia Father    Diabetes Father    Heart attack Father    Varicose Veins Father    Peripheral vascular disease Father        amputation   Cancer Paternal Aunt        lung/ brain   Diabetes Maternal Grandmother     Social History   Socioeconomic History   Marital status: Widowed    Spouse name: Not on file   Number of children: Not on file   Years of education: Not on file   Highest education level: 12th grade  Occupational History   Not on file  Tobacco Use   Smoking status: Former    Packs/day: 0.75    Years: 30.00    Additional pack years: 0.00    Total pack years: 22.50    Types: Cigarettes   Smokeless tobacco: Never   Tobacco comments:    recently quit 03/01/2015  Vaping Use   Vaping Use: Never used  Substance and Sexual Activity   Alcohol use: No    Alcohol/week: 0.0 standard drinks of alcohol   Drug use: No   Sexual activity: Yes  Other Topics Concern   Not on file  Social History Narrative   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   Financial Resource Strain: Low Risk  (10/08/2022)   Overall Financial Resource Strain (CARDIA)    Difficulty of Paying Living Expenses: Not hard at all  Food Insecurity: No Food Insecurity (10/08/2022)   Hunger Vital Sign    Worried About Running Out of Food in the Last Year: Never true     Ran Out of Food in the Last Year: Never true  Transportation Needs: No Transportation Needs (10/08/2022)   PRAPARE - Administrator, Civil Service (Medical): No    Lack of Transportation (  Non-Medical): No  Physical Activity: Insufficiently Active (10/08/2022)   Exercise Vital Sign    Days of Exercise per Week: 3 days    Minutes of Exercise per Session: 30 min  Stress: Stress Concern Present (10/08/2022)   [employer] of Occupational Health - Occupational Stress Questionnaire    Feeling of Stress : Very much  Social Connections: Moderately Isolated (10/08/2022)   Social Connection and Isolation Panel [NHANES]    Frequency of Communication with Friends and Family: More than three times a week    Frequency of Social Gatherings with Friends and Family: Once a week    Attends Religious Services: More than 4 times per year    Active Member of [employer] or Organizations: No    Attends Banker Meetings: Never    Marital Status: Widowed  Intimate Partner Violence: Not At Risk (04/06/2022)   Humiliation, Afraid, Rape, and Kick questionnaire    Fear of Current or Ex-Partner: No    Emotionally Abused: No    Physically Abused: No    Sexually Abused: No     Constitutional: Denies fever, malaise, fatigue, headache or abrupt weight changes.  HEENT: Denies eye pain, eye redness, ear pain, ringing in the ears, wax buildup, runny nose, nasal congestion, bloody nose, or sore throat. Respiratory: Denies difficulty breathing, shortness of breath, cough or sputum production.   Cardiovascular: Denies chest pain, chest tightness, palpitations or swelling in the hands or feet.  Gastrointestinal: Pt reports nausea and abdominal cramping. Denies bloating, constipation, diarrhea or blood in the stool.  GU: Denies urgency, frequency, pain with urination, burning sensation, blood in urine, odor or discharge. Musculoskeletal: Patient reports intermittent joint pain.  Denies decrease in range  of motion, difficulty with gait, muscle pain or joint swelling.  Skin: Denies redness, rashes, lesions or ulcercations.  Neurological: Patient reports insomnia, intermittent dizziness.  Denies difficulty with memory, difficulty with speech or problems with balance and coordination.  Psych: Patient has a history of anxiety.  Denies depression, SI/HI.  No other specific complaints in a complete review of systems (except as listed in HPI above).  Objective:   Physical Exam  BP 134/72 (BP Location: Left Arm, Patient Position: Sitting, Cuff Size: Normal)   Pulse 77   Temp (!) 95.3 F (35.2 C) (Temporal)   Wt 179 lb (81.2 kg)   SpO2 98%   BMI 29.79 kg/m   Wt Readings from Last 3 Encounters:  07/23/22 179 lb 12.8 oz (81.6 kg)  07/09/22 180 lb (81.6 kg)  06/05/22 179 lb (81.2 kg)    General: Appears her stated age, overweight, in NAD. Skin: Warm, dry and intact. No ulcerations noted. HEENT: Head: normal shape and size; Eyes: sclera white, no icterus, conjunctiva pink, PERRLA and EOMs intact; Cardiovascular: Normal rate and rhythm. S1,S2 noted.  No murmur, rubs or gallops noted. No JVD or BLE edema. No carotid bruits noted. Pulmonary/Chest: Normal effort and positive vesicular breath sounds. No respiratory distress. No wheezes, rales or ronchi noted.  Abdomen: Soft and nontender. Normal bowel sounds.  Musculoskeletal:  No difficulty with gait.  Neurological: Alert and oriented. Coordination normal.  Psychiatric: Mood and affect normal. Behavior is normal. Judgment and thought content normal.     BMET    Component Value Date/Time   NA 140 07/09/2022 0906   K 4.4 07/09/2022 0906   CL 104 07/09/2022 0906   CO2 28 07/09/2022 0906   GLUCOSE 141 (H) 07/09/2022 0906   BUN 19 07/09/2022 0906   CREATININE  0.71 07/09/2022 0906   CALCIUM 9.7 07/09/2022 0906   GFRNONAA 76 09/27/2016 0854   GFRAA 87 09/27/2016 0854    Lipid Panel     Component Value Date/Time   CHOL 185 07/09/2022  0906   TRIG 216 (H) 07/09/2022 0906   HDL 60 07/09/2022 0906   CHOLHDL 3.1 07/09/2022 0906   VLDL 60.8 (H) 12/17/2019 0901   LDLCALC 94 07/09/2022 0906    CBC    Component Value Date/Time   WBC 8.3 04/06/2022 0848   RBC 4.46 04/06/2022 0848   HGB 13.0 04/06/2022 0848   HCT 39.0 04/06/2022 0848   PLT 309 04/06/2022 0848   MCV 87.4 04/06/2022 0848   MCH 29.1 04/06/2022 0848   MCHC 33.3 04/06/2022 0848   RDW 12.5 04/06/2022 0848   LYMPHSABS 3,576 12/29/2021 1437   MONOABS 0.6 01/07/2014 1402   EOSABS 0 (L) 12/29/2021 1437   BASOSABS 64 12/29/2021 1437    Hgb A1C Lab Results  Component Value Date   HGBA1C 9.1 (A) 07/09/2022            Assessment & Plan:      RTC in 3 months for follow-up of chronic conditions Nicki Reaper, NP

## 2022-10-12 NOTE — Assessment & Plan Note (Signed)
Okay to take Tylenol OTC as needed ?

## 2022-10-12 NOTE — Assessment & Plan Note (Signed)
C-Met and lipid profile today Encouraged her to consume a low-fat diet Continue atorvastatin, ezetimibe and fenofibrate

## 2022-10-12 NOTE — Assessment & Plan Note (Signed)
Continue calcium and vitamin D Encourage daily weightbearing exercise 

## 2022-10-12 NOTE — Assessment & Plan Note (Signed)
Continue trazodone 

## 2022-10-12 NOTE — Assessment & Plan Note (Signed)
Not currently medicated We will monitor 

## 2022-10-12 NOTE — Assessment & Plan Note (Signed)
Encourage diet and exercise for weight loss 

## 2022-10-13 LAB — COMPLETE METABOLIC PANEL WITH GFR
AG Ratio: 1.7 (calc) (ref 1.0–2.5)
ALT: 28 U/L (ref 6–29)
AST: 23 U/L (ref 10–35)
Albumin: 4.5 g/dL (ref 3.6–5.1)
Alkaline phosphatase (APISO): 72 U/L (ref 37–153)
BUN: 17 mg/dL (ref 7–25)
CO2: 26 mmol/L (ref 20–32)
Calcium: 9.4 mg/dL (ref 8.6–10.4)
Chloride: 103 mmol/L (ref 98–110)
Creat: 0.71 mg/dL (ref 0.50–1.05)
Globulin: 2.6 g/dL (calc) (ref 1.9–3.7)
Glucose, Bld: 144 mg/dL — ABNORMAL HIGH (ref 65–99)
Potassium: 4.5 mmol/L (ref 3.5–5.3)
Sodium: 139 mmol/L (ref 135–146)
Total Bilirubin: 0.4 mg/dL (ref 0.2–1.2)
Total Protein: 7.1 g/dL (ref 6.1–8.1)
eGFR: 95 mL/min/{1.73_m2} (ref >=60)

## 2022-10-13 LAB — LIPID PANEL
Cholesterol: 200 mg/dL — ABNORMAL HIGH (ref <200)
HDL: 64 mg/dL (ref >=50)
LDL Cholesterol (Calc): 103 mg/dL (calc) — ABNORMAL HIGH
Non-HDL Cholesterol (Calc): 136 mg/dL (calc) — ABNORMAL HIGH (ref <130)
Total CHOL/HDL Ratio: 3.1 (calc) (ref <5.0)
Triglycerides: 212 mg/dL — ABNORMAL HIGH (ref <150)

## 2022-10-20 ENCOUNTER — Other Ambulatory Visit: Payer: Self-pay | Admitting: Internal Medicine

## 2022-10-20 DIAGNOSIS — Z794 Long term (current) use of insulin: Secondary | ICD-10-CM

## 2022-10-22 NOTE — Telephone Encounter (Signed)
Requested Prescriptions  Pending Prescriptions Disp Refills   metFORMIN (GLUCOPHAGE) 1000 MG tablet [Pharmacy Med Name: METFORMIN HCL 1,000 MG TABLET] 180 tablet 1    Sig: TAKE 1 TABLET (1,000 MG TOTAL) BY MOUTH TWICE A DAY WITH FOOD     Endocrinology:  Diabetes - Biguanides Failed - 10/20/2022  9:00 AM      Failed - HBA1C is between 0 and 7.9 and within 180 days    HbA1c POC (<> result, manual entry)  Date Value Ref Range Status  10/12/2022 8.0 4.0 - 5.6 % Final         Passed - Cr in normal range and within 360 days    Creat  Date Value Ref Range Status  10/12/2022 0.71 0.50 - 1.05 mg/dL Final   Creatinine,U  Date Value Ref Range Status  12/15/2018 61.2 mg/dL Final   Creatinine, Urine  Date Value Ref Range Status  04/06/2022 75 20 - 275 mg/dL Final         Passed - eGFR in normal range and within 360 days    GFR, Est African American  Date Value Ref Range Status  09/27/2016 87 >=60 mL/min Final   GFR, Est Non African American  Date Value Ref Range Status  09/27/2016 76 >=60 mL/min Final   GFR  Date Value Ref Range Status  12/17/2019 65.14 >60.00 mL/min Final   eGFR  Date Value Ref Range Status  10/12/2022 95 > OR = 60 mL/min/1.24m2 Final         Passed - B12 Level in normal range and within 720 days    Vitamin B-12  Date Value Ref Range Status  12/29/2021 565 200 - 1,100 pg/mL Final         Passed - Valid encounter within last 6 months    Recent Outpatient Visits           1 week ago Type 2 diabetes mellitus with hyperglycemia, with long-term current use of insulin (HCC)   [place] [facility] Goodman, Salvadore Oxford, NP   2 weeks ago Acute non-recurrent ethmoidal sinusitis   [place] [facility] [place], Salvadore Oxford, NP   3 months ago Localized osteoporosis without current pathological fracture   [place] [facility] [place], Salvadore Oxford, NP   4 months ago [facility] [place],  [place] W, NP   5 months ago Type 2 diabetes mellitus with hyperglycemia, with long-term current use of insulin [facility])   [place] [facility] Delles, Jackelyn Poling, RPH-CPP       Future Appointments             In 2 months Baity, Salvadore Oxford, NP [place], [facility]            Passed - CBC within normal limits and completed in the last 12 months    WBC  Date Value Ref Range Status  04/06/2022 8.3 3.8 - 10.8 Thousand/uL Final   RBC  Date Value Ref Range Status  04/06/2022 4.46 3.80 - 5.10 Million/uL Final   Hemoglobin  Date Value Ref Range Status  04/06/2022 13.0 11.7 - 15.5 g/dL Final   HCT  Date Value Ref Range Status  04/06/2022 39.0 35.0 - 45.0 % Final   MCHC  Date Value Ref Range Status  04/06/2022 33.3 32.0 - 36.0 g/dL Final   [facility]  Date Value Ref Range Status  04/06/2022  29.1 27.0 - 33.0 pg Final   MCV  Date Value Ref Range Status  04/06/2022 87.4 80.0 - 100.0 fL Final   No results found for: "PLTCOUNTKUC", "LABPLAT", "POCPLA" RDW  Date Value Ref Range Status  04/06/2022 12.5 11.0 - 15.0 % Final

## 2022-10-25 ENCOUNTER — Other Ambulatory Visit: Payer: Self-pay | Admitting: Internal Medicine

## 2022-10-25 NOTE — Telephone Encounter (Signed)
Refilled 10/04/22 # 90. Requested Prescriptions  Refused Prescriptions Disp Refills   traZODone (DESYREL) 50 MG tablet [Pharmacy Med Name: TRAZODONE 50 MG TABLET] 90 tablet 0    Sig: TAKE 1/2 TO 1 TABLET BY MOUTH AT BEDTIME AS NEEDED FOR SLEEP     Psychiatry: Antidepressants - Serotonin Modulator Passed - 10/25/2022 10:11 AM      Passed - Valid encounter within last 6 months    Recent Outpatient Visits           1 week ago Type 2 diabetes mellitus with hyperglycemia, with long-term current use of insulin [facility])   [place] [facility] [place], [place], NP   2 weeks ago Acute non-recurrent ethmoidal sinusitis   [place] [facility] [place], [place], NP   3 months ago Localized osteoporosis without current pathological fracture   [place] [facility] [place], Salvadore Oxford, NP   4 months ago [employer]   [facility] [place], [place] W, NP   5 months ago Type 2 diabetes mellitus with hyperglycemia, with long-term current use of insulin [facility])   [place] [facility] Delles, Jackelyn Poling, RPH-CPP       Future Appointments             In 2 months Baity, Salvadore Oxford, NP [place] [facility], [facility]

## 2022-10-29 ENCOUNTER — Other Ambulatory Visit: Payer: Self-pay | Admitting: Pharmacist

## 2022-10-29 MED ORDER — ATORVASTATIN CALCIUM 10 MG PO TABS: 10.0000 mg | ORAL_TABLET | Freq: Every day | ORAL | 1 refills | Status: DC

## 2022-10-29 NOTE — Patient Instructions (Signed)
Goals Addressed             This Visit's Progress    Pharmacy Goals       Our goal A1c is less than 7%. This corresponds with fasting sugars less than 130 and 2 hour after meal sugars less than 180. Please use your Freestyle Libre 2 continuous glucose monitor for feedback and blood sugar control  Our goal bad cholesterol, or LDL, is less than 70 . This is why it is important to continue taking your atorvastatin and ezetimibe, as well as the fenofibrate for control of triglycerides  Estelle Grumbles, PharmD, [employer] Clinical Pharmacist [facility] [phone]

## 2022-10-29 NOTE — Progress Notes (Signed)
   10/29/2022  Patient ID: [name], female   DOB: [birth year], 67 y.o.   MRN: [id number]  Pharmacy Quality Measure Review  This patient is appearing on report for being at risk of failing the adherence measure for Statin Use in Persons with Diabetes (SUPD) medications this calendar year.   Medication: atorvastatin 10 mg Per dispensing history in chart: last filled: 02/06/2022  Latest Lipid Panel results:  Lab Results  Component Value Date   CHOL 200 (H) 10/12/2022   HDL 64 10/12/2022   LDLCALC 103 (H) 10/12/2022   LDLDIRECT 63.0 12/17/2019   TRIG 212 (H) 10/12/2022   CHOLHDL 3.1 10/12/2022    Outreach to patient by telephone today. Patient reports taking both ezetimibe 10 mg daily and fenofibrate 145 mg daily, but denies taking atorvastatin. States was not aware that she still had an active prescription for atorvastatin.  Counsel patient on importance of LDL control for lowering ASCVD risk. Counsel patient to take all three cholesterol lowering medications, atorvastatin, ezetimibe and fenofibrate daily for LDL and triglyceride lowering.  Discuss lifestyle factors, including diet and exercise, to aid with cholesterol control.  Counsel patient on LDL goal of <70.  [facility] who advises patient has only a partial quantity remaining on her atorvastatin prescription.   Plan  1) Will follow up with PCP to request provider send renewal of atorvastatin prescription to pharmacy for patient  2) Patient denies further medication questions or concerns today. Patient to contact clinic pharmacist to contact if needed in future for medication questions/concerns  Estelle Grumbles, PharmD, [facility] Clinical Pharmacist [facility] [phone] \

## 2022-10-31 ENCOUNTER — Other Ambulatory Visit: Payer: Self-pay | Admitting: Internal Medicine

## 2022-10-31 NOTE — Telephone Encounter (Signed)
No longer on current medication list Requested Prescriptions  Pending Prescriptions Disp Refills   OZEMPIC, 0.25 OR 0.5 MG/DOSE, 2 MG/3ML SOPN [Pharmacy Med Name: OZEMPIC 0.25-0.5 MG/DOSE PEN]  2    Sig: INJECT 0.5 MG INTO THE SKIN ONE TIME PER WEEK     Endocrinology:  Diabetes - GLP-1 Receptor Agonists - semaglutide Passed - 10/31/2022  2:21 AM      Passed - HBA1C in normal range and within 180 days    HbA1c POC (<> result, manual entry)  Date Value Ref Range Status  10/12/2022 8.0 4.0 - 5.6 % Final         Passed - Cr in normal range and within 360 days    Creat  Date Value Ref Range Status  10/12/2022 0.71 0.50 - 1.05 mg/dL Final   Creatinine,U  Date Value Ref Range Status  12/15/2018 61.2 mg/dL Final   Creatinine, Urine  Date Value Ref Range Status  04/06/2022 75 20 - 275 mg/dL Final         Passed - Valid encounter within last 6 months    Recent Outpatient Visits           2 weeks ago Type 2 diabetes mellitus with hyperglycemia, with long-term current use of insulin [facility])   [place] [facility] [place], Salvadore Oxford, NP   3 weeks ago Acute non-recurrent ethmoidal sinusitis   [place] [facility] [place], Salvadore Oxford, NP   3 months ago Localized osteoporosis without current pathological fracture   [place] [facility] [place], Salvadore Oxford, NP   4 months ago [facility] [place], [place] W, NP   5 months ago Type 2 diabetes mellitus with hyperglycemia, with long-term current use of insulin [facility])   [place] [facility] Delles, Jackelyn Poling, RPH-CPP       Future Appointments             In 2 months Baity, Salvadore Oxford, NP [place] [facility], [facility]

## 2022-11-12 DIAGNOSIS — Z1231 Encounter for screening mammogram for malignant neoplasm of breast: Secondary | ICD-10-CM | POA: Diagnosis not present

## 2022-11-12 LAB — HM MAMMOGRAPHY

## 2022-12-10 ENCOUNTER — Other Ambulatory Visit: Payer: Self-pay

## 2022-12-10 ENCOUNTER — Other Ambulatory Visit: Payer: Self-pay | Admitting: Internal Medicine

## 2022-12-10 DIAGNOSIS — E1165 Type 2 diabetes mellitus with hyperglycemia: Secondary | ICD-10-CM

## 2022-12-10 DIAGNOSIS — E785 Hyperlipidemia, unspecified: Secondary | ICD-10-CM

## 2022-12-10 MED ORDER — GLIPIZIDE 10 MG PO TABS
5.0000 mg | ORAL_TABLET | Freq: Two times a day (BID) | ORAL | 1 refills | Status: AC
Start: 2022-12-10 — End: ?

## 2022-12-11 ENCOUNTER — Other Ambulatory Visit: Payer: Self-pay

## 2022-12-11 DIAGNOSIS — E785 Hyperlipidemia, unspecified: Secondary | ICD-10-CM

## 2022-12-11 MED ORDER — FENOFIBRATE 145 MG PO TABS
145.0000 mg | ORAL_TABLET | Freq: Every day | ORAL | 1 refills | Status: DC
Start: 2022-12-11 — End: 2023-06-14

## 2022-12-11 MED ORDER — LANTUS SOLOSTAR 100 UNIT/ML ~~LOC~~ SOPN: 44.0000 [IU] | PEN_INJECTOR | Freq: Every day | SUBCUTANEOUS | 0 refills | Status: DC

## 2022-12-11 NOTE — Telephone Encounter (Signed)
Copied from CRM [id number]. Topic: General - Other >> Dec 11, 2022 11:08 AM Everette C wrote: Reason for CRM: The patient has called to follow up fenofibrate (TRICOR) 145 MG tablet [[id number]] and insulin glargine (LANTUS SOLOSTAR) 100 UNIT/ML Solostar Pen [[id number]]   Please contact the patient further when possible

## 2022-12-11 NOTE — Telephone Encounter (Signed)
Unable to refill per protocol, Rx request is too soon. Last refill 08/08/22 for 90 and 1 refill.  Requested Prescriptions  Pending Prescriptions Disp Refills   LANTUS SOLOSTAR 100 UNIT/ML Solostar Pen [Pharmacy Med Name: LANTUS SOLOSTAR 100 UNIT/ML]  1    Sig: INJECT 44 UNITS INTO THE SKIN DAILY     Endocrinology:  Diabetes - Insulins Failed - 12/10/2022  7:52 AM      Failed - HBA1C is between 0 and 7.9 and within 180 days    HbA1c POC (<> result, manual entry)  Date Value Ref Range Status  10/12/2022 8.0 4.0 - 5.6 % Final         Passed - Valid encounter within last 6 months    Recent Outpatient Visits           2 months ago Type 2 diabetes mellitus with hyperglycemia, with long-term current use of insulin [facility])   [place] [facility] [place], [place], NP   2 months ago Acute non-recurrent ethmoidal sinusitis   [place] [facility] [place], [place] W, NP   5 months ago Localized osteoporosis without current pathological fracture   [place], Salvadore Oxford, NP   6 months ago [employer]   [facility] [place], [place] W, NP   7 months ago Type 2 diabetes mellitus with hyperglycemia, with long-term current use of insulin (HCC)   [place] [facility] Delles, Jackelyn Poling, RPH-CPP       Future Appointments             In 1 month Leonard, Salvadore Oxford, NP [place] [facility], [facility]             fenofibrate (TRICOR) 145 MG tablet [Pharmacy Med Name: FENOFIBRATE 145 MG TABLET] 90 tablet 1    Sig: TAKE 1 TABLET BY MOUTH EVERY DAY     Cardiovascular:  Antilipid - Fibric Acid Derivatives Failed - 12/10/2022  7:52 AM      Failed - Lipid Panel in normal range within the last 12 months    Cholesterol  Date Value Ref Range Status  10/12/2022 200 (H) <200 mg/dL Final   LDL Cholesterol (Calc)  Date Value Ref Range Status  10/12/2022 103 (H) mg/dL (calc) Final    Comment:     Reference range: <100 . Desirable range <100 mg/dL for primary prevention;   <70 mg/dL for patients with CHD or diabetic patients  with > or = 2 CHD risk factors. Marland Kitchen LDL-C is now calculated using the Martin-Hopkins  calculation, which is a validated novel method providing  better accuracy than the Friedewald equation in the  estimation of LDL-C.  Horald Pollen et al. Lenox Ahr. 9147;829(56): 2061-2068  (http://education.Quest[employer].com/faq/FAQ164)    Direct LDL  Date Value Ref Range Status  12/17/2019 63.0 mg/dL Final    Comment:    Optimal:  <100 mg/dLNear or Above Optimal:  100-129 mg/dLBorderline High:  130-159 mg/dLHigh:  160-189 mg/dLVery High:  >190 mg/dL   HDL  Date Value Ref Range Status  10/12/2022 64 > OR = 50 mg/dL Final   Triglycerides  Date Value Ref Range Status  10/12/2022 212 (H) <150 mg/dL Final    Comment:    . If a non-fasting specimen was collected, consider repeat triglyceride testing on a fasting specimen if clinically indicated.  Perry Mount et al. J. of Clin. Lipidol. 2015;9:129-169. Marland Kitchen  Passed - ALT in normal range and within 360 days    ALT  Date Value Ref Range Status  10/12/2022 28 6 - 29 U/L Final         Passed - AST in normal range and within 360 days    AST  Date Value Ref Range Status  10/12/2022 23 10 - 35 U/L Final         Passed - Cr in normal range and within 360 days    Creat  Date Value Ref Range Status  10/12/2022 0.71 0.50 - 1.05 mg/dL Final   Creatinine,U  Date Value Ref Range Status  12/15/2018 61.2 mg/dL Final   Creatinine, Urine  Date Value Ref Range Status  04/06/2022 75 20 - 275 mg/dL Final         Passed - HGB in normal range and within 360 days    Hemoglobin  Date Value Ref Range Status  04/06/2022 13.0 11.7 - 15.5 g/dL Final         Passed - HCT in normal range and within 360 days    HCT  Date Value Ref Range Status  04/06/2022 39.0 35.0 - 45.0 % Final         Passed - PLT in normal range  and within 360 days    Platelets  Date Value Ref Range Status  04/06/2022 309 140 - 400 Thousand/uL Final         Passed - WBC in normal range and within 360 days    WBC  Date Value Ref Range Status  04/06/2022 8.3 3.8 - 10.8 Thousand/uL Final         Passed - eGFR is 30 or above and within 360 days    GFR, Est African American  Date Value Ref Range Status  09/27/2016 87 >=60 mL/min Final   GFR, Est Non African American  Date Value Ref Range Status  09/27/2016 76 >=60 mL/min Final   GFR  Date Value Ref Range Status  12/17/2019 65.14 >60.00 mL/min Final   eGFR  Date Value Ref Range Status  10/12/2022 95 > OR = 60 mL/min/1.9m2 Final         Passed - Valid encounter within last 12 months    Recent Outpatient Visits           2 months ago Type 2 diabetes mellitus with hyperglycemia, with long-term current use of insulin [facility])   [place] [facility] [place], Salvadore Oxford, NP   2 months ago Acute non-recurrent ethmoidal sinusitis   [facility] [place], Salvadore Oxford, NP   5 months ago Localized osteoporosis without current pathological fracture   [place] [facility] [place], Salvadore Oxford, NP   6 months ago [facility] [place], [place] W, NP   7 months ago Type 2 diabetes mellitus with hyperglycemia, with long-term current use of insulin [facility])   [place] [facility] Delles, Jackelyn Poling, RPH-CPP       Future Appointments             In 1 month Baity, Salvadore Oxford, NP [place] [facility], [facility]

## 2022-12-24 DIAGNOSIS — E119 Type 2 diabetes mellitus without complications: Secondary | ICD-10-CM | POA: Diagnosis not present

## 2022-12-24 LAB — HM DIABETES EYE EXAM

## 2022-12-29 ENCOUNTER — Other Ambulatory Visit: Payer: Self-pay | Admitting: Internal Medicine

## 2022-12-29 DIAGNOSIS — E1165 Type 2 diabetes mellitus with hyperglycemia: Secondary | ICD-10-CM

## 2023-01-01 NOTE — Telephone Encounter (Signed)
Requested Prescriptions  Pending Prescriptions Disp Refills   glucose blood (ONETOUCH VERIO) test strip [Pharmacy Med Name: ONE TOUCH VERIO TEST STRIP] 300 strip 0    Sig: USE AS DIRECTED TO CHECK BLOOD SUGAR 3 TIMES A DAY     Endocrinology: Diabetes - Testing Supplies Passed - 12/29/2022  8:00 PM      Passed - Valid encounter within last 12 months    Recent Outpatient Visits           2 months ago Type 2 diabetes mellitus with hyperglycemia, with long-term current use of insulin [facility])   [place] [facility] [place], [place], NP   2 months ago Acute non-recurrent ethmoidal sinusitis   [place] [facility] [place], Salvadore Oxford, NP   5 months ago Localized osteoporosis without current pathological fracture   [place] [facility] [place], Salvadore Oxford, NP   7 months ago [place] [place], [place] W, NP   7 months ago Type 2 diabetes mellitus with hyperglycemia, with long-term current use of insulin [facility])   [place] [facility] Delles, Jackelyn Poling, RPH-CPP       Future Appointments             In 2 weeks Baity, Salvadore Oxford, NP [place] [facility], [facility]

## 2023-01-11 DIAGNOSIS — L570 Actinic keratosis: Secondary | ICD-10-CM | POA: Diagnosis not present

## 2023-01-11 DIAGNOSIS — Z1283 Encounter for screening for malignant neoplasm of skin: Secondary | ICD-10-CM | POA: Diagnosis not present

## 2023-01-11 DIAGNOSIS — X32XXXD Exposure to sunlight, subsequent encounter: Secondary | ICD-10-CM | POA: Diagnosis not present

## 2023-01-11 DIAGNOSIS — D225 Melanocytic nevi of trunk: Secondary | ICD-10-CM | POA: Diagnosis not present

## 2023-01-11 DIAGNOSIS — L820 Inflamed seborrheic keratosis: Secondary | ICD-10-CM | POA: Diagnosis not present

## 2023-01-16 ENCOUNTER — Encounter: Payer: Self-pay | Admitting: Internal Medicine

## 2023-01-16 ENCOUNTER — Ambulatory Visit (INDEPENDENT_AMBULATORY_CARE_PROVIDER_SITE_OTHER): Payer: PPO | Admitting: Internal Medicine

## 2023-01-16 VITALS — BP 124/68 | HR 92 | Temp 95.9°F | Wt 178.0 lb

## 2023-01-16 DIAGNOSIS — K219 Gastro-esophageal reflux disease without esophagitis: Secondary | ICD-10-CM | POA: Diagnosis not present

## 2023-01-16 DIAGNOSIS — Z794 Long term (current) use of insulin: Secondary | ICD-10-CM

## 2023-01-16 DIAGNOSIS — M816 Localized osteoporosis [Lequesne]: Secondary | ICD-10-CM | POA: Diagnosis not present

## 2023-01-16 DIAGNOSIS — E785 Hyperlipidemia, unspecified: Secondary | ICD-10-CM

## 2023-01-16 DIAGNOSIS — M1990 Unspecified osteoarthritis, unspecified site: Secondary | ICD-10-CM | POA: Diagnosis not present

## 2023-01-16 DIAGNOSIS — Z23 Encounter for immunization: Secondary | ICD-10-CM

## 2023-01-16 DIAGNOSIS — H8113 Benign paroxysmal vertigo, bilateral: Secondary | ICD-10-CM | POA: Diagnosis not present

## 2023-01-16 DIAGNOSIS — E1165 Type 2 diabetes mellitus with hyperglycemia: Secondary | ICD-10-CM

## 2023-01-16 DIAGNOSIS — E1169 Type 2 diabetes mellitus with other specified complication: Secondary | ICD-10-CM

## 2023-01-16 DIAGNOSIS — E663 Overweight: Secondary | ICD-10-CM | POA: Diagnosis not present

## 2023-01-16 DIAGNOSIS — F5104 Psychophysiologic insomnia: Secondary | ICD-10-CM | POA: Diagnosis not present

## 2023-01-16 DIAGNOSIS — F419 Anxiety disorder, unspecified: Secondary | ICD-10-CM | POA: Diagnosis not present

## 2023-01-16 DIAGNOSIS — Z6829 Body mass index (BMI) 29.0-29.9, adult: Secondary | ICD-10-CM

## 2023-01-16 LAB — POCT GLYCOSYLATED HEMOGLOBIN (HGB A1C): Hemoglobin A1C: 9.2 % — AB (ref 4.0–5.6)

## 2023-01-16 MED ORDER — OZEMPIC (1 MG/DOSE) 4 MG/3ML ~~LOC~~ SOPN
1.0000 mg | PEN_INJECTOR | SUBCUTANEOUS | 2 refills | Status: AC
Start: 2023-01-16 — End: ?

## 2023-01-16 NOTE — Assessment & Plan Note (Signed)
Continue Zofran and meclizine as needed

## 2023-01-16 NOTE — Assessment & Plan Note (Signed)
POCT A1c 9.2% We will check urine microalbumin Encourage low-carb diet and exercise for weight loss Continue glipizide, metformin and Lantus Will discontinue Rybelsus and start Ozempic 1 mg weekly Will request copy of eye exam Encouraged routine foot exam

## 2023-01-16 NOTE — Progress Notes (Signed)
Subjective:    Patient ID: [name], female    DOB: Jul 16, 1955, 67 y.o.   MRN: [id number]  HPI  Patient presents to clinic today for 72-month follow-up of chronic conditions.  BPPV: Intermittent, managed with zofran and meclizine as needed.  She reports a flare a couple of weeks ago.  She follows with ENT.  OA: Mainly in her hands and wrists.  She is not currently taking any medications for this.  She does not follow with orthopedics.  GERD: Triggered by spicy foods.  She denies breakthrough on pantoprazole.  There is no upper GI on file.  HLD: Her last LDL was 103, triglycerides 212, 10/2022.  She denies myalgias on atorvastatin, ezetimibe and fenofibrate.  She does not consume a low-fat diet.  DM2: Her last A1c was 8%, 10/2022.  She is taking glipizide, metformin, rybelsus and lantus as prescribed.  Her sugars range 64-208.  She checks her feet routinely.  Her last eye exam was 12/2022.  Flu 01/2022.  Pneumovax 04/2022.  COVID never.  She does not follow with endocrinology.  Insomnia: She has difficulty staying asleep.  She takes trazodone as prescribed.  There is no sleep study on file.  Anxiety: Chronic but she is not currently taking any medications for this.  She is not currently seeing a therapist.  She denies depression, SI/HI.  Osteoporosis: She is taking calcium and vitamin D as prescribed.  She tries to get some weightbearing exercise in.  Bone density from 04/2021 reviewed.   Review of Systems     Past Medical History:  Diagnosis Date   Blood transfusion    1984   [facility]   Chronic kidney disease    occasional renal stone   Complication of anesthesia    woke during surgery (ablation) 2006?   Diabetes mellitus    type 2 Diab- A1C-7   Encounter for general adult medical examination with abnormal findings 02/02/2022   GERD (gastroesophageal reflux disease)    occasional Protonix-will take protonix night before surgery   Hypercholesteremia    on meds    Current  Outpatient Medications  Medication Sig Dispense Refill   amoxicillin-clavulanate (AUGMENTIN) 875-125 MG tablet Take 1 tablet by mouth 2 (two) times daily. 20 tablet 0   aspirin EC 81 MG tablet Take 81 mg by mouth daily. Swallow whole.     atorvastatin (LIPITOR) 10 MG tablet Take 1 tablet (10 mg total) by mouth daily. 90 tablet 1   B-D UF III MINI PEN NEEDLES 31G X 5 MM MISC Use 4x a day 200 each 3   Biotin 5 MG CAPS Take by mouth.     Blood Glucose Monitoring Suppl (ONETOUCH VERIO) w/Device KIT Use as instructed to check blood sugar 3X daily 1 kit 0   CALCIUM PO Take 1,200 mg by mouth daily.     Cholecalciferol (VITAMIN D-3) 25 MCG (1000 UT) CAPS Take 1,000 Units by mouth daily.     ezetimibe (ZETIA) 10 MG tablet TAKE 1 TABLET BY MOUTH EVERY DAY 90 tablet 3   fenofibrate (TRICOR) 145 MG tablet Take 1 tablet (145 mg total) by mouth daily. 90 tablet 1   glipiZIDE (GLUCOTROL) 10 MG tablet Take 0.5 tablets (5 mg total) by mouth 2 (two) times daily before a meal. 180 tablet 1   glucose blood (ONETOUCH VERIO) test strip USE AS DIRECTED TO CHECK BLOOD SUGAR 3 TIMES A DAY 300 strip 0   insulin glargine (LANTUS SOLOSTAR) 100 UNIT/ML Solostar Pen Inject  44 Units into the skin daily. 45 mL 0   metFORMIN (GLUCOPHAGE) 1000 MG tablet TAKE 1 TABLET (1,000 MG TOTAL) BY MOUTH TWICE A DAY WITH FOOD 180 tablet 1   Multiple Vitamins-Minerals (MULTIVITAMINS THER. W/MINERALS) TABS Take 1 tablet by mouth daily.     mupirocin ointment (BACTROBAN) 2 % Apply 1 Application topically 2 (two) times daily. 22 g 0   Omega-3 Fatty Acids (FISH OIL) 1000 MG CAPS Take 1,000 mg by mouth 2 (two) times daily.     ondansetron (ZOFRAN-ODT) 8 MG disintegrating tablet 8mg  ODT q4 hours prn nausea 30 tablet 0   pantoprazole (PROTONIX) 40 MG tablet TAKE 1 TABLET BY MOUTH TWICE A DAY 180 tablet 1   Semaglutide (RYBELSUS) 7 MG TABS Take 1 tablet (7 mg total) by mouth daily at 12 noon. 90 tablet 0   traZODone (DESYREL) 50 MG tablet TAKE  0.5-1 TABLETS BY MOUTH AT BEDTIME AS NEEDED FOR SLEEP. 90 tablet 0   Zinc Gluconate-Vitamin C (BL ZINC-VITAMIN C LOZENGE MT) Use as directed 1 tablet in the mouth or throat daily.     No current facility-administered medications for this visit.    Allergies  Allergen Reactions   Jardiance [Empagliflozin]     Myalgias   Sulfa Antibiotics     Other reaction(s): Edema    Family History  Problem Relation Age of Onset   Diabetes Mother    Hypertension Mother    Hyperlipidemia Mother    Heart disease Mother    Heart disease Father    Hyperlipidemia Father    Diabetes Father    Heart attack Father    Varicose Veins Father    Peripheral vascular disease Father        amputation   Cancer Paternal Aunt        lung/ brain   Diabetes Maternal Grandmother     Social History   Socioeconomic History   Marital status: Widowed    Spouse name: Not on file   Number of children: Not on file   Years of education: Not on file   Highest education level: 12th grade  Occupational History   Not on file  Tobacco Use   Smoking status: Former    Current packs/day: 0.75    Average packs/day: 0.8 packs/day for 30.0 years (22.5 ttl pk-yrs)    Types: Cigarettes   Smokeless tobacco: Never   Tobacco comments:    recently quit 03/01/2015  Vaping Use   Vaping status: Never Used  Substance and Sexual Activity   Alcohol use: No    Alcohol/week: 0.0 standard drinks of alcohol   Drug use: No   Sexual activity: Yes  Other Topics Concern   Not on file  Social History Narrative   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   Financial Resource Strain: Low Risk  (10/08/2022)   Overall Financial Resource Strain (CARDIA)    Difficulty of Paying Living Expenses: Not hard at all  Food Insecurity: No Food Insecurity (10/08/2022)   Hunger Vital Sign    Worried About Running Out of Food in the Last Year: Never true    Ran Out of Food in the Last Year: Never true  Transportation Needs: No  Transportation Needs (10/08/2022)   PRAPARE - Administrator, Civil Service (Medical): No    Lack of Transportation (Non-Medical): No  Physical Activity: Insufficiently Active (10/08/2022)   Exercise Vital Sign    Days of Exercise per Week: 3  days    Minutes of Exercise per Session: 30 min  Stress: Stress Concern Present (10/08/2022)   [employer] of Occupational Health - Occupational Stress Questionnaire    Feeling of Stress : Very much  Social Connections: Moderately Isolated (10/08/2022)   Social Connection and Isolation Panel [NHANES]    Frequency of Communication with Friends and Family: More than three times a week    Frequency of Social Gatherings with Friends and Family: Once a week    Attends Religious Services: More than 4 times per year    Active Member of [employer] or Organizations: No    Attends Banker Meetings: Never    Marital Status: Widowed  Intimate Partner Violence: Not At Risk (04/06/2022)   Humiliation, Afraid, Rape, and Kick questionnaire    Fear of Current or Ex-Partner: No    Emotionally Abused: No    Physically Abused: No    Sexually Abused: No     Constitutional: Denies fever, malaise, fatigue, headache or abrupt weight changes.  HEENT: Denies eye pain, eye redness, ear pain, ringing in the ears, wax buildup, runny nose, nasal congestion, bloody nose, or sore throat. Respiratory: Denies difficulty breathing, shortness of breath, cough or sputum production.   Cardiovascular: Denies chest pain, chest tightness, palpitations or swelling in the hands or feet.  Gastrointestinal: Denies abdominal pain, bloating, constipation, diarrhea or blood in the stool.  GU: Denies urgency, frequency, pain with urination, burning sensation, blood in urine, odor or discharge. Musculoskeletal: Patient reports joint pain.  Denies decrease in range of motion, difficulty with gait, muscle pain or joint swelling.  Skin: Denies redness, rashes, lesions or  ulcercations.  Neurological: Patient reports insomnia.  Denies dizziness, difficulty with memory, difficulty with speech or problems with balance and coordination.  Psych: Patient has a history of anxiety.  Denies depression, SI/HI.  No other specific complaints in a complete review of systems (except as listed in HPI above).  Objective:   Physical Exam   BP 124/68 (BP Location: Right Arm, Patient Position: Sitting, Cuff Size: Normal)   Pulse 92   Temp (!) 95.9 F (35.5 C) (Temporal)   Wt 178 lb (80.7 kg)   SpO2 96%   BMI 29.62 kg/m   Wt Readings from Last 3 Encounters:  10/12/22 179 lb (81.2 kg)  07/23/22 179 lb 12.8 oz (81.6 kg)  07/09/22 180 lb (81.6 kg)    General: Appears her stated age, overweight, in NAD. Skin: Warm, dry and intact. No  ulcerations noted. HEENT: Head: normal shape and size; Eyes: sclera white, no icterus, conjunctiva pink, PERRLA and EOMs intact;  Cardiovascular: Normal rate and rhythm. S1,S2 noted.  No murmur, rubs or gallops noted. No JVD or BLE edema. No carotid bruits noted. Pulmonary/Chest: Normal effort and positive vesicular breath sounds. No respiratory distress. No wheezes, rales or ronchi noted.  Abdomen: Soft and nontender. Normal bowel sounds.  Musculoskeletal:  No difficulty with gait.  Neurological: Alert and oriented. Coordination normal.  Psychiatric: Mood and affect normal. Behavior is normal. Judgment and thought content normal.     BMET    Component Value Date/Time   NA 139 10/12/2022 0908   K 4.5 10/12/2022 0908   CL 103 10/12/2022 0908   CO2 26 10/12/2022 0908   GLUCOSE 144 (H) 10/12/2022 0908   BUN 17 10/12/2022 0908   CREATININE 0.71 10/12/2022 0908   CALCIUM 9.4 10/12/2022 0908   GFRNONAA 76 09/27/2016 0854   GFRAA 87 09/27/2016 0854    Lipid  Panel     Component Value Date/Time   CHOL 200 (H) 10/12/2022 0908   TRIG 212 (H) 10/12/2022 0908   HDL 64 10/12/2022 0908   CHOLHDL 3.1 10/12/2022 0908   VLDL 60.8 (H)  12/17/2019 0901   LDLCALC 103 (H) 10/12/2022 0908    CBC    Component Value Date/Time   WBC 8.3 04/06/2022 0848   RBC 4.46 04/06/2022 0848   HGB 13.0 04/06/2022 0848   HCT 39.0 04/06/2022 0848   PLT 309 04/06/2022 0848   MCV 87.4 04/06/2022 0848   MCH 29.1 04/06/2022 0848   MCHC 33.3 04/06/2022 0848   RDW 12.5 04/06/2022 0848   LYMPHSABS 3,576 12/29/2021 1437   MONOABS 0.6 01/07/2014 1402   EOSABS 0 (L) 12/29/2021 1437   BASOSABS 64 12/29/2021 1437    Hgb A1C Lab Results  Component Value Date   HGBA1C 8.0 10/12/2022           Assessment & Plan:     RTC in 3 months for annual exam Nicki Reaper, NP

## 2023-01-16 NOTE — Patient Instructions (Signed)

## 2023-01-16 NOTE — Assessment & Plan Note (Signed)
Not medicated Support offered 

## 2023-01-16 NOTE — Assessment & Plan Note (Signed)
Continue calcium and vitamin D Encouraged daily weightbearing exercise

## 2023-01-16 NOTE — Assessment & Plan Note (Signed)
Avoid foods that trigger reflux Encourage weight loss as this can help reduce reflux symptoms Continue pantoprazole

## 2023-01-16 NOTE — Assessment & Plan Note (Signed)
C-Met and lipid profile today Encouraged her to consume a low-fat diet Continue atorvastatin, ezetimibe and fenofibrate

## 2023-01-16 NOTE — Assessment & Plan Note (Signed)
Encouraged diet and exercise for weight loss ?

## 2023-01-16 NOTE — Assessment & Plan Note (Signed)
Continue trazodone as needed. 

## 2023-01-16 NOTE — Assessment & Plan Note (Signed)
Okay to take Tylenol OTC as needed 

## 2023-01-17 LAB — CBC
HCT: 40 % (ref 35.0–45.0)
Hemoglobin: 12.7 g/dL (ref 11.7–15.5)
MCH: 28.3 pg (ref 27.0–33.0)
MCHC: 31.8 g/dL — ABNORMAL LOW (ref 32.0–36.0)
MCV: 89.1 fL (ref 80.0–100.0)
MPV: 11.7 fL (ref 7.5–12.5)
Platelets: 300 10*3/uL (ref 140–400)
RBC: 4.49 10*6/uL (ref 3.80–5.10)
RDW: 12.3 % (ref 11.0–15.0)
WBC: 7.1 10*3/uL (ref 3.8–10.8)

## 2023-01-17 LAB — COMPLETE METABOLIC PANEL WITH GFR
AG Ratio: 1.6 (calc) (ref 1.0–2.5)
ALT: 22 U/L (ref 6–29)
AST: 17 U/L (ref 10–35)
Albumin: 4.5 g/dL (ref 3.6–5.1)
Alkaline phosphatase (APISO): 75 U/L (ref 37–153)
BUN: 21 mg/dL (ref 7–25)
CO2: 27 mmol/L (ref 20–32)
Calcium: 9.9 mg/dL (ref 8.6–10.4)
Chloride: 99 mmol/L (ref 98–110)
Creat: 0.88 mg/dL (ref 0.50–1.05)
Globulin: 2.8 g/dL (ref 1.9–3.7)
Glucose, Bld: 183 mg/dL — ABNORMAL HIGH (ref 65–99)
Potassium: 4.4 mmol/L (ref 3.5–5.3)
Sodium: 136 mmol/L (ref 135–146)
Total Bilirubin: 0.5 mg/dL (ref 0.2–1.2)
Total Protein: 7.3 g/dL (ref 6.1–8.1)
eGFR: 73 mL/min/{1.73_m2} (ref >=60)

## 2023-01-17 LAB — LIPID PANEL
Cholesterol: 155 mg/dL (ref <200)
HDL: 54 mg/dL (ref >=50)
LDL Cholesterol (Calc): 67 mg/dL
Non-HDL Cholesterol (Calc): 101 mg/dL (ref <130)
Total CHOL/HDL Ratio: 2.9 (calc) (ref <5.0)
Triglycerides: 256 mg/dL — ABNORMAL HIGH (ref <150)

## 2023-01-24 ENCOUNTER — Other Ambulatory Visit: Payer: Self-pay | Admitting: Internal Medicine

## 2023-01-25 NOTE — Telephone Encounter (Signed)
Requested Prescriptions  Pending Prescriptions Disp Refills   RYBELSUS 7 MG TABS [Pharmacy Med Name: RYBELSUS 7 MG TABLET] 30 tablet 2    Sig: TAKE 1 TABLET (7 MG TOTAL) BY MOUTH DAILY AT 12 NOON.     Off-Protocol Failed - 01/24/2023  1:31 AM      Failed - Medication not assigned to a protocol, review manually.      Passed - Valid encounter within last 12 months    Recent Outpatient Visits           1 week ago Type 2 diabetes mellitus with hyperglycemia, with long-term current use of insulin [facility])   [place] [facility] [place], [place] W, NP   3 months ago Type 2 diabetes mellitus with hyperglycemia, with long-term current use of insulin [facility])   [place] [facility] [place], Salvadore Oxford, NP   3 months ago Acute non-recurrent ethmoidal sinusitis   [place] [facility] [place], Salvadore Oxford, NP   6 months ago Localized osteoporosis without current pathological fracture   [facility] [place], Salvadore Oxford, NP   7 months ago [employer]   [facility] [place], Salvadore Oxford, NP       Future Appointments             In 2 months Baity, Salvadore Oxford, NP [place] [facility], [facility]

## 2023-01-28 ENCOUNTER — Other Ambulatory Visit: Payer: Self-pay | Admitting: Internal Medicine

## 2023-01-28 DIAGNOSIS — Z794 Long term (current) use of insulin: Secondary | ICD-10-CM

## 2023-01-29 NOTE — Telephone Encounter (Signed)
Rx 10/22/22 #180 1RF- too soon Requested Prescriptions  Pending Prescriptions Disp Refills   metFORMIN (GLUCOPHAGE) 1000 MG tablet [Pharmacy Med Name: METFORMIN HCL 1,000 MG TABLET] 180 tablet 1    Sig: TAKE 1 TABLET (1,000 MG TOTAL) BY MOUTH TWICE A DAY WITH FOOD     Endocrinology:  Diabetes - Biguanides Failed - 01/28/2023  1:23 PM      Failed - HBA1C is between 0 and 7.9 and within 180 days    Hemoglobin A1C  Date Value Ref Range Status  01/16/2023 9.2 (A) 4.0 - 5.6 % Final   HbA1c POC (<> result, manual entry)  Date Value Ref Range Status  10/12/2022 8.0 4.0 - 5.6 % Final         Failed - CBC within normal limits and completed in the last 12 months    WBC  Date Value Ref Range Status  01/16/2023 7.1 3.8 - 10.8 Thousand/uL Final   RBC  Date Value Ref Range Status  01/16/2023 4.49 3.80 - 5.10 Million/uL Final   Hemoglobin  Date Value Ref Range Status  01/16/2023 12.7 11.7 - 15.5 g/dL Final   HCT  Date Value Ref Range Status  01/16/2023 40.0 35.0 - 45.0 % Final   MCHC  Date Value Ref Range Status  01/16/2023 31.8 (L) 32.0 - 36.0 g/dL Final   [facility]  Date Value Ref Range Status  01/16/2023 28.3 27.0 - 33.0 pg Final   MCV  Date Value Ref Range Status  01/16/2023 89.1 80.0 - 100.0 fL Final   No results found for: "PLTCOUNTKUC", "LABPLAT", "POCPLA" RDW  Date Value Ref Range Status  01/16/2023 12.3 11.0 - 15.0 % Final         Passed - Cr in normal range and within 360 days    Creat  Date Value Ref Range Status  01/16/2023 0.88 0.50 - 1.05 mg/dL Final   Creatinine,U  Date Value Ref Range Status  12/15/2018 61.2 mg/dL Final   Creatinine, Urine  Date Value Ref Range Status  04/06/2022 75 20 - 275 mg/dL Final         Passed - eGFR in normal range and within 360 days    GFR, Est African American  Date Value Ref Range Status  09/27/2016 87 >=60 mL/min Final   GFR, Est Non African American  Date Value Ref Range Status  09/27/2016 76 >=60 mL/min Final   GFR   Date Value Ref Range Status  12/17/2019 65.14 >60.00 mL/min Final   eGFR  Date Value Ref Range Status  01/16/2023 73 > OR = 60 mL/min/1.52m2 Final         Passed - B12 Level in normal range and within 720 days    Vitamin B-12  Date Value Ref Range Status  12/29/2021 565 200 - 1,100 pg/mL Final         Passed - Valid encounter within last 6 months    Recent Outpatient Visits           1 week ago Type 2 diabetes mellitus with hyperglycemia, with long-term current use of insulin [facility])   [place] [facility] [place], [place], NP   3 months ago Type 2 diabetes mellitus with hyperglycemia, with long-term current use of insulin [facility])   [place] [facility] [place], Salvadore Oxford, NP   3 months ago Acute non-recurrent ethmoidal sinusitis   [place] [facility] [place], Salvadore Oxford, NP   6 months ago  Localized osteoporosis without current pathological fracture   [place] [facility] [place], Salvadore Oxford, NP   7 months ago [facility] [place], Salvadore Oxford, NP       Future Appointments             In 2 months Baity, Salvadore Oxford, NP [place] [facility], [place]

## 2023-02-05 ENCOUNTER — Ambulatory Visit: Payer: Self-pay

## 2023-02-05 NOTE — Telephone Encounter (Signed)
  Chief Complaint: nail problem Symptoms: L 3rd finger skin around base of nail red and swelling and tender  Frequency: Sunday, had manicure Saturday  Pertinent Negatives: Patient denies pain or drainage  Disposition: [] ED /[] Urgent Care (no appt availability in office) / [x] Appointment(In office/virtual)/ []  [place] [facility]/ [] Home Care/ [] Refused Recommended Disposition /[] [place] [facility]/ []  Follow-up with PCP Additional Notes: pt states she had nails done in gel polish Saturday didn't have any cuts or anything happen with manicure, went to church Sunday and noticed the redness and it has gotten worse since. Pt cleaned area and applied polysporin and bandaid today. Scheduled VV 02/07/23 at 1120 with Rene Kocher, NP.   Summary: infected nail   Pt has an infection under her third finger.    She said is hurting now and there is no appts until Friday.  please advise  [phone]       Reason for Disposition  [1] Red area or streak AND [2] no fever  Answer Assessment - Initial Assessment Questions 1. LOCATION: "Where is the wound located?"      L hand 3rd fingernail skin around nail at base  2. WOUND APPEARANCE: "What does the wound look like?"      Redness and swelling  3. SIZE: If redness is present, ask: "What is the size of the red area?" (Inches, centimeters, or compare to size of a coin)      Eraser end  5. ONSET: "When did it start to look infected?"      Sunday  6. MECHANISM: "How did the wound start, what was the cause?"     Had nails done Saturday  7. PAIN: "Is there any pain?" If Yes, ask: "How bad is the pain?"   (Scale 1-10; or mild, moderate, severe)     no 9. OTHER SYMPTOMS: "Do you have any other symptoms?" (e.g., shaking chills, weakness, rash elsewhere on body)     Redness and swelling  Protocols used: Wound Infection-A-AH

## 2023-02-07 ENCOUNTER — Encounter: Payer: Self-pay | Admitting: Internal Medicine

## 2023-02-07 ENCOUNTER — Telehealth: Payer: PPO | Admitting: Internal Medicine

## 2023-02-07 DIAGNOSIS — L539 Erythematous condition, unspecified: Secondary | ICD-10-CM

## 2023-02-07 NOTE — Patient Instructions (Signed)
Paronychia Paronychia is an infection of the skin. It happens near a fingernail or toenail. It may cause pain and swelling around the nail. In some cases, a fluid-filled bump (abscess) can form near or under the nail. Often, this condition is not serious, and it clears up with treatment. What are the causes? This condition may be caused by a germ. The germ may be bacteria or a fungus. These germs can enter the body through an opening in the skin, such as a cut or a hangnail. Other causes include: Repeated injuries to your fingernails or toenails. Irritation of the base and sides of the nail (cuticle). What increases the risk? This condition is more likely to develop in people who: Get their hands wet often, such as a dishwasher. Bite their fingernails or the base and sides of their nails. Have other skin problems. Have hangnails or hurt fingertips. Come into contact with chemicals like detergents. Have diabetes. What are the signs or symptoms? Redness and swelling of the skin near the nail. A tender feeling around the nail. Pus-filled bumps under the skin at the base and sides of the nail. Fluid or pus under the nail. Pain in the area. How is this treated? Treatment depends on the cause of your condition and how bad it is. If your condition is mild, it may clear up on its own in a few days or after soaking in warm water. If needed, treatment may include: Antibiotic medicine. Antifungal medicine. A procedure to drain pus from a fluid-filled bump. Medicine to treat irritation and swelling (corticosteroids). Taking off part of an ingrown toenail. A bandage (dressing) may be placed over the nail area. Follow these instructions at home: Wound care Keep the affected area clean. Soak the fingers or toes in warm water as told by your doctor. You may be told to do this for 20 minutes, 2-3 times a day. Keep the area dry when you are not soaking it. Do not try to drain a fluid-filled bump on  your own. Follow instructions from your doctor about how to take care of the affected area. Make sure you: Wash your hands with soap and water for at least 20 seconds before and after you change your bandage. If you cannot use soap and water, use hand sanitizer. Change your bandage as told by your doctor. If you had a fluid-filled bump and your doctor drained it, check the area every day for signs of infection. Check for: Redness, swelling, or pain. Fluid or blood. Warmth. Pus or a bad smell. Medicines  Take over-the-counter and prescription medicines only as told by your doctor. If you were prescribed an antibiotic medicine, take it as told by your doctor. Do not stop taking it even if you start to feel better. General instructions Avoid contact with anything that irritates your skin or that you are allergic to. Do not pick at the affected area. Keep all follow-up visits. Prevention To prevent this condition from happening again: Wear rubber gloves when putting your hands in water for washing dishes or other tasks. Wear gloves if your hands might touch cleaners or chemicals. Avoid injuring your nails or fingertips. Do not bite your nails or tear hangnails. Do not cut your nails very short. Do not cut the skin at the base and sides of the nail. Use clean nail clippers or scissors when trimming nails. Contact a doctor if: You feel worse. You do not get better. You keep having or you have more fluid, blood, or   pus coming from the affected area. Your affected finger, toe, or joint gets swollen or hard to move. You have a fever or chills. There is redness spreading from the affected area. Summary Paronychia is an infection of the skin. It happens near a fingernail or toenail. This condition may cause pain and swelling around the nail. Soak the fingers or toes in warm water as told by your doctor. Often, this condition is not serious, and it clears up with treatment. This information  is not intended to replace advice given to you by your health care provider. Make sure you discuss any questions you have with your health care provider. Document Revised: 07/25/2020 Document Reviewed: 07/25/2020 Elsevier Patient Education  2024 [employer]  

## 2023-02-07 NOTE — Progress Notes (Signed)
Virtual Visit via Video Note  I connected with [name] on 02/07/23 at 11:20 AM EDT by a video enabled telemedicine application and verified that I am speaking with the correct person using two identifiers.  Location: Patient: Home Provider: Office  Persons participating in this video call: Nicki Reaper, NP and Georgeanna Harrison   I discussed the limitations of evaluation and management by telemedicine and the availability of in person appointments. The patient expressed understanding and agreed to proceed.  History of Present Illness:  Patient presents to clinic today with complaint of redness, swelling around the nailbed of her middle finger on her left hand.  She reports this started Sunday.  She has not noticed any drainage from the area and the area is not tender to touch.  She reports she did get a manicure on Saturday.  She does not recall any accidental cuts while getting her manicure.  She has tried polysporin OTC with some relief of symptoms.  She was concerned because of her history of diabetes, last A1c 9.2%, 01/2023.   Past Medical History:  Diagnosis Date   Blood transfusion    1984   [facility]   Chronic kidney disease    occasional renal stone   Complication of anesthesia    woke during surgery (ablation) 2006?   Diabetes mellitus    type 2 Diab- A1C-7   Encounter for general adult medical examination with abnormal findings 02/02/2022   GERD (gastroesophageal reflux disease)    occasional Protonix-will take protonix night before surgery   Hypercholesteremia    on meds    Current Outpatient Medications  Medication Sig Dispense Refill   aspirin EC 81 MG tablet Take 81 mg by mouth daily. Swallow whole.     atorvastatin (LIPITOR) 10 MG tablet Take 1 tablet (10 mg total) by mouth daily. 90 tablet 1   B-D UF III MINI PEN NEEDLES 31G X 5 MM MISC Use 4x a day 200 each 3   Biotin 5 MG CAPS Take by mouth.     Blood Glucose Monitoring Suppl (ONETOUCH VERIO) w/Device KIT Use as  instructed to check blood sugar 3X daily 1 kit 0   CALCIUM PO Take 1,200 mg by mouth daily.     Cholecalciferol (VITAMIN D-3) 25 MCG (1000 UT) CAPS Take 1,000 Units by mouth daily.     ezetimibe (ZETIA) 10 MG tablet TAKE 1 TABLET BY MOUTH EVERY DAY 90 tablet 3   fenofibrate (TRICOR) 145 MG tablet Take 1 tablet (145 mg total) by mouth daily. 90 tablet 1   glipiZIDE (GLUCOTROL) 10 MG tablet Take 0.5 tablets (5 mg total) by mouth 2 (two) times daily before a meal. 180 tablet 1   glucose blood (ONETOUCH VERIO) test strip USE AS DIRECTED TO CHECK BLOOD SUGAR 3 TIMES A DAY 300 strip 0   insulin glargine (LANTUS SOLOSTAR) 100 UNIT/ML Solostar Pen Inject 44 Units into the skin daily. 45 mL 0   metFORMIN (GLUCOPHAGE) 1000 MG tablet TAKE 1 TABLET (1,000 MG TOTAL) BY MOUTH TWICE A DAY WITH FOOD 180 tablet 1   Multiple Vitamins-Minerals (MULTIVITAMINS THER. W/MINERALS) TABS Take 1 tablet by mouth daily.     mupirocin ointment (BACTROBAN) 2 % Apply 1 Application topically 2 (two) times daily. 22 g 0   Omega-3 Fatty Acids (FISH OIL) 1000 MG CAPS Take 1,000 mg by mouth 2 (two) times daily.     ondansetron (ZOFRAN-ODT) 8 MG disintegrating tablet 8mg  ODT q4 hours prn nausea 30 tablet 0  OZEMPIC, 1 MG/DOSE, 4 MG/3ML SOPN Inject 1 mg into the skin once a week. 9 mL 2   pantoprazole (PROTONIX) 40 MG tablet TAKE 1 TABLET BY MOUTH TWICE A DAY 180 tablet 1   traZODone (DESYREL) 50 MG tablet TAKE 0.5-1 TABLETS BY MOUTH AT BEDTIME AS NEEDED FOR SLEEP. 90 tablet 0   Zinc Gluconate-Vitamin C (BL ZINC-VITAMIN C LOZENGE MT) Use as directed 1 tablet in the mouth or throat daily.     No current facility-administered medications for this visit.    Allergies  Allergen Reactions   Jardiance [Empagliflozin]     Myalgias   Sulfa Antibiotics     Other reaction(s): Edema    Family History  Problem Relation Age of Onset   Diabetes Mother    Hypertension Mother    Hyperlipidemia Mother    Heart disease Mother    Heart  disease Father    Hyperlipidemia Father    Diabetes Father    Heart attack Father    Varicose Veins Father    Peripheral vascular disease Father        amputation   Cancer Paternal Aunt        lung/ brain   Diabetes Maternal Grandmother     Social History   Socioeconomic History   Marital status: Widowed    Spouse name: Not on file   Number of children: Not on file   Years of education: Not on file   Highest education level: 12th grade  Occupational History   Not on file  Tobacco Use   Smoking status: Former    Current packs/day: 0.75    Average packs/day: 0.8 packs/day for 30.0 years (22.5 ttl pk-yrs)    Types: Cigarettes   Smokeless tobacco: Never   Tobacco comments:    recently quit 03/01/2015  Vaping Use   Vaping status: Never Used  Substance and Sexual Activity   Alcohol use: No    Alcohol/week: 0.0 standard drinks of alcohol   Drug use: No   Sexual activity: Yes  Other Topics Concern   Not on file  Social History Narrative   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   Financial Resource Strain: Low Risk  (10/08/2022)   Overall Financial Resource Strain (CARDIA)    Difficulty of Paying Living Expenses: Not hard at all  Food Insecurity: No Food Insecurity (10/08/2022)   Hunger Vital Sign    Worried About Running Out of Food in the Last Year: Never true    Ran Out of Food in the Last Year: Never true  Transportation Needs: No Transportation Needs (10/08/2022)   PRAPARE - Administrator, Civil Service (Medical): No    Lack of Transportation (Non-Medical): No  Physical Activity: Insufficiently Active (10/08/2022)   Exercise Vital Sign    Days of Exercise per Week: 3 days    Minutes of Exercise per Session: 30 min  Stress: Stress Concern Present (10/08/2022)   [employer] of Occupational Health - Occupational Stress Questionnaire    Feeling of Stress : Very much  Social Connections: Moderately Isolated (10/08/2022)   Social Connection  and Isolation Panel [NHANES]    Frequency of Communication with Friends and Family: More than three times a week    Frequency of Social Gatherings with Friends and Family: Once a week    Attends Religious Services: More than 4 times per year    Active Member of [employer] or Organizations: No    Attends [employer]  or Organization Meetings: Never    Marital Status: Widowed  Intimate Partner Violence: Not At Risk (04/06/2022)   Humiliation, Afraid, Rape, and Kick questionnaire    Fear of Current or Ex-Partner: No    Emotionally Abused: No    Physically Abused: No    Sexually Abused: No     Constitutional: Denies fever, malaise, fatigue, headache or abrupt weight changes.  Respiratory: Denies difficulty breathing, shortness of breath, cough or sputum production.   Cardiovascular: Denies chest pain, chest tightness, palpitations or swelling in the hands or feet.  Musculoskeletal: Denies decrease in range of motion, difficulty with gait, muscle pain or joint pain and swelling.  Skin: Patient reports redness, swelling around the nailbed of her middle finger on her left hand.  Denies  rashes, lesions or ulcercations.    No other specific complaints in a complete review of systems (except as listed in HPI above).    Observations/Objective:   Wt Readings from Last 3 Encounters:  01/16/23 178 lb (80.7 kg)  10/12/22 179 lb (81.2 kg)  07/23/22 179 lb 12.8 oz (81.6 kg)    General: Appears her stated age, obese in NAD. Skin:  Pulmonary/Chest: Normal effort No respiratory distress.  Neurological: Alert and oriented.   BMET    Component Value Date/Time   NA 136 01/16/2023 0858   K 4.4 01/16/2023 0858   CL 99 01/16/2023 0858   CO2 27 01/16/2023 0858   GLUCOSE 183 (H) 01/16/2023 0858   BUN 21 01/16/2023 0858   CREATININE 0.88 01/16/2023 0858   CALCIUM 9.9 01/16/2023 0858   GFRNONAA 76 09/27/2016 0854   GFRAA 87 09/27/2016 0854    Lipid Panel     Component Value Date/Time   CHOL 155  01/16/2023 0858   TRIG 256 (H) 01/16/2023 0858   HDL 54 01/16/2023 0858   CHOLHDL 2.9 01/16/2023 0858   VLDL 60.8 (H) 12/17/2019 0901   LDLCALC 67 01/16/2023 0858    CBC    Component Value Date/Time   WBC 7.1 01/16/2023 0858   RBC 4.49 01/16/2023 0858   HGB 12.7 01/16/2023 0858   HCT 40.0 01/16/2023 0858   PLT 300 01/16/2023 0858   MCV 89.1 01/16/2023 0858   MCH 28.3 01/16/2023 0858   MCHC 31.8 (L) 01/16/2023 0858   RDW 12.3 01/16/2023 0858   LYMPHSABS 3,576 12/29/2021 1437   MONOABS 0.6 01/07/2014 1402   EOSABS 0 (L) 12/29/2021 1437   BASOSABS 64 12/29/2021 1437    Hgb A1C Lab Results  Component Value Date   HGBA1C 9.2 (A) 01/16/2023       Assessment and Plan:  Redness of left middle finger:  DDx include chemical burn versus paronychia She denies increased swelling, pain, throbbing or discharge and does feel like her symptoms are better today after using Polysporin Will have her continue to use Polysporin and if worsens, will plan to treat with Keflex 500 mg 3 times daily x 7 days for presumed paronychia Encouraged her to soak in Epsom salt if symptoms persist or worsen  RTC in 2 months for your annual exam  Follow Up Instructions:    I discussed the assessment and treatment plan with the patient. The patient was provided an opportunity to ask questions and all were answered. The patient agreed with the plan and demonstrated an understanding of the instructions.   The patient was advised to call back or seek an in-person evaluation if the symptoms worsen or if the condition fails to improve as anticipated.  Nicki Reaper, NP

## 2023-02-25 ENCOUNTER — Encounter: Payer: Self-pay | Admitting: Internal Medicine

## 2023-02-27 MED ORDER — CEPHALEXIN 500 MG PO CAPS: 500.0000 mg | ORAL_CAPSULE | Freq: Three times a day (TID) | ORAL | 0 refills | Status: DC

## 2023-03-15 ENCOUNTER — Other Ambulatory Visit: Payer: Self-pay | Admitting: Internal Medicine

## 2023-03-15 NOTE — Telephone Encounter (Signed)
Requested Prescriptions  Pending Prescriptions Disp Refills   pantoprazole (PROTONIX) 40 MG tablet [Pharmacy Med Name: PANTOPRAZOLE SOD DR 40 MG TAB] 180 tablet 1    Sig: TAKE 1 TABLET BY MOUTH TWICE A DAY     Gastroenterology: Proton Pump Inhibitors Passed - 03/15/2023  8:18 AM      Passed - Valid encounter within last 12 months    Recent Outpatient Visits           1 month ago Erythema of finger   [place] [facility] [place], [place] W, NP   1 month ago Type 2 diabetes mellitus with hyperglycemia, with long-term current use of insulin [facility])   [place] [facility] [place], [place] W, NP   5 months ago Type 2 diabetes mellitus with hyperglycemia, with long-term current use of insulin [facility])   [place] [facility] [place], [place] W, NP   5 months ago Acute non-recurrent ethmoidal sinusitis   [facility] [place], [place] W, NP   8 months ago Localized osteoporosis without current pathological fracture   [place] [facility] [place], Salvadore Oxford, NP       Future Appointments             In 1 month [place], Salvadore Oxford, NP [facility]             OZEMPIC, 0.25 OR 0.5 MG/DOSE, 2 MG/3ML SOPN [Pharmacy Med Name: OZEMPIC 0.25-0.5 MG/DOSE PEN]  2    Sig: INJECT 0.5 MG INTO THE SKIN ONE TIME PER WEEK     Endocrinology:  Diabetes - GLP-1 Receptor Agonists - semaglutide Failed - 03/15/2023  8:18 AM      Failed - HBA1C in normal range and within 180 days    Hemoglobin A1C  Date Value Ref Range Status  01/16/2023 9.2 (A) 4.0 - 5.6 % Final   HbA1c POC (<> result, manual entry)  Date Value Ref Range Status  10/12/2022 8.0 4.0 - 5.6 % Final         Passed - Cr in normal range and within 360 days    Creat  Date Value Ref Range Status  01/16/2023 0.88 0.50 - 1.05 mg/dL Final   Creatinine,U  Date Value Ref Range Status  12/15/2018 61.2 mg/dL Final   Creatinine, Urine   Date Value Ref Range Status  04/06/2022 75 20 - 275 mg/dL Final         Passed - Valid encounter within last 6 months    Recent Outpatient Visits           1 month ago Erythema of finger   [place] [facility] [place], [place], NP   1 month ago Type 2 diabetes mellitus with hyperglycemia, with long-term current use of insulin [facility])   [place] [facility] [place], [place], NP   5 months ago Type 2 diabetes mellitus with hyperglycemia, with long-term current use of insulin [facility])   [place] [facility] [place], Salvadore Oxford, NP   5 months ago Acute non-recurrent ethmoidal sinusitis   [place] [facility] [place], [place], NP   8 months ago Localized osteoporosis without current pathological fracture   [place] [facility] [place], Salvadore Oxford, NP       Future Appointments             In  1 month Ba[place], Salvadore Oxford, NP [place] [facility]             LANTUS SOLOSTAR 100 UNIT/ML Solostar Pen [employer] Med Name: LANTUS SOLOSTAR 100 UNIT/ML]  1    Sig: INJECT 44 UNITS INTO THE SKIN DAILY     Endocrinology:  Diabetes - Insulins Failed - 03/15/2023  8:18 AM      Failed - HBA1C is between 0 and 7.9 and within 180 days    Hemoglobin A1C  Date Value Ref Range Status  01/16/2023 9.2 (A) 4.0 - 5.6 % Final   HbA1c POC (<> result, manual entry)  Date Value Ref Range Status  10/12/2022 8.0 4.0 - 5.6 % Final         Passed - Valid encounter within last 6 months    Recent Outpatient Visits           1 month ago Erythema of finger   [place] [facility] [place], [place] W, NP   1 month ago Type 2 diabetes mellitus with hyperglycemia, with long-term current use of insulin [facility])   [place] [facility] [place], [place], NP   5 months ago Type 2 diabetes mellitus with hyperglycemia, with long-term current use of insulin [facility])   [place] [facility] [place], Salvadore Oxford, NP   5 months ago Acute non-recurrent ethmoidal sinusitis   [place] [facility] [place], [place], NP   8 months ago Localized osteoporosis without current pathological fracture   [place] [facility] [place], Salvadore Oxford, NP       Future Appointments             In 1 month Baity, Salvadore Oxford, NP [place] [facility], [facility]

## 2023-03-15 NOTE — Telephone Encounter (Signed)
Requested medication (s) are due for refill today: yes}  Requested medication (s) are on the active medication list: yes  Last refill:  12/11/22 to 03/11/23  Future visit scheduled: yes  Notes to clinic:  Med expired   Requested Prescriptions  Pending Prescriptions Disp Refills   LANTUS SOLOSTAR 100 UNIT/ML Solostar Pen [Pharmacy Med Name: LANTUS SOLOSTAR 100 UNIT/ML]  1    Sig: INJECT 44 UNITS INTO THE SKIN DAILY     Endocrinology:  Diabetes - Insulins Failed - 03/15/2023  8:18 AM      Failed - HBA1C is between 0 and 7.9 and within 180 days    Hemoglobin A1C  Date Value Ref Range Status  01/16/2023 9.2 (A) 4.0 - 5.6 % Final   HbA1c POC (<> result, manual entry)  Date Value Ref Range Status  10/12/2022 8.0 4.0 - 5.6 % Final         Passed - Valid encounter within last 6 months    Recent Outpatient Visits           1 month ago Erythema of finger   [place] [facility] [place], [place], NP   1 month ago Type 2 diabetes mellitus with hyperglycemia, with long-term current use of insulin [facility])   [place] [facility] [place], [place] W, NP   5 months ago Type 2 diabetes mellitus with hyperglycemia, with long-term current use of insulin [facility])   [place] [facility] [place], [place], NP   5 months ago Acute non-recurrent ethmoidal sinusitis   [place] [facility] [place], [place], NP   8 months ago Localized osteoporosis without current pathological fracture   [place] [facility] [place], Salvadore Oxford, NP       Future Appointments             In 1 month Baity, Salvadore Oxford, NP [place] [facility]            Signed Prescriptions Disp Refills   pantoprazole (PROTONIX) 40 MG tablet 180 tablet 1    Sig: TAKE 1 TABLET BY MOUTH TWICE A DAY     Gastroenterology: Proton Pump Inhibitors Passed - 03/15/2023  8:18 AM      Passed - Valid encounter within last 12 months     Recent Outpatient Visits           1 month ago Erythema of finger   [place] [facility] [place], [place] W, NP   1 month ago Type 2 diabetes mellitus with hyperglycemia, with long-term current use of insulin [facility])   [place] [facility] [place], [place], NP   5 months ago Type 2 diabetes mellitus with hyperglycemia, with long-term current use of insulin [facility])   [place] [facility] [place], [place], NP   5 months ago Acute non-recurrent ethmoidal sinusitis   [place] [facility] [place], [place] W, NP   8 months ago Localized osteoporosis without current pathological fracture   [place] [facility] [place], Salvadore Oxford, NP       Future Appointments             In 1 month Baity, Salvadore Oxford, NP  [place] [facility]            Refused Prescriptions Disp Refills   OZEMPIC, 0.25 OR 0.5 MG/DOSE, 2 MG/3ML SOPN [  Pharmacy Med Name: OZEMPIC 0.25-0.5 MG/DOSE PEN]  2    Sig: INJECT 0.5 MG INTO THE SKIN ONE TIME PER WEEK     Endocrinology:  Diabetes - GLP-1 Receptor Agonists - semaglutide Failed - 03/15/2023  8:18 AM      Failed - HBA1C in normal range and within 180 days    Hemoglobin A1C  Date Value Ref Range Status  01/16/2023 9.2 (A) 4.0 - 5.6 % Final   HbA1c POC (<> result, manual entry)  Date Value Ref Range Status  10/12/2022 8.0 4.0 - 5.6 % Final         Passed - Cr in normal range and within 360 days    Creat  Date Value Ref Range Status  01/16/2023 0.88 0.50 - 1.05 mg/dL Final   Creatinine,U  Date Value Ref Range Status  12/15/2018 61.2 mg/dL Final   Creatinine, Urine  Date Value Ref Range Status  04/06/2022 75 20 - 275 mg/dL Final         Passed - Valid encounter within last 6 months    Recent Outpatient Visits           1 month ago Erythema of finger   [place] [facility] [place], [place], NP   1 month ago Type 2 diabetes mellitus with  hyperglycemia, with long-term current use of insulin [facility])   [place] [place], [place], NP   5 months ago Type 2 diabetes mellitus with hyperglycemia, with long-term current use of insulin [facility])   [place] [facility] [place], [place], NP   5 months ago Acute non-recurrent ethmoidal sinusitis   [place] [facility] [place], [place], NP   8 months ago Localized osteoporosis without current pathological fracture   [place] [facility], Salvadore Oxford, NP       Future Appointments             In 1 month Baity, Salvadore Oxford, NP [place] [facility], [facility]

## 2023-03-20 ENCOUNTER — Other Ambulatory Visit: Payer: Self-pay | Admitting: Internal Medicine

## 2023-03-20 DIAGNOSIS — E1165 Type 2 diabetes mellitus with hyperglycemia: Secondary | ICD-10-CM

## 2023-03-21 NOTE — Telephone Encounter (Signed)
Requested Prescriptions  Pending Prescriptions Disp Refills   metFORMIN (GLUCOPHAGE) 1000 MG tablet [Pharmacy Med Name: METFORMIN HCL 1,000 MG TABLET] 180 tablet 1    Sig: TAKE 1 TABLET (1,000 MG TOTAL) BY MOUTH TWICE A DAY WITH FOOD     Endocrinology:  Diabetes - Biguanides Failed - 03/20/2023  9:23 AM      Failed - HBA1C is between 0 and 7.9 and within 180 days    Hemoglobin A1C  Date Value Ref Range Status  01/16/2023 9.2 (A) 4.0 - 5.6 % Final   HbA1c POC (<> result, manual entry)  Date Value Ref Range Status  10/12/2022 8.0 4.0 - 5.6 % Final         Failed - CBC within normal limits and completed in the last 12 months    WBC  Date Value Ref Range Status  01/16/2023 7.1 3.8 - 10.8 Thousand/uL Final   RBC  Date Value Ref Range Status  01/16/2023 4.49 3.80 - 5.10 Million/uL Final   Hemoglobin  Date Value Ref Range Status  01/16/2023 12.7 11.7 - 15.5 g/dL Final   HCT  Date Value Ref Range Status  01/16/2023 40.0 35.0 - 45.0 % Final   MCHC  Date Value Ref Range Status  01/16/2023 31.8 (L) 32.0 - 36.0 g/dL Final   [facility]  Date Value Ref Range Status  01/16/2023 28.3 27.0 - 33.0 pg Final   MCV  Date Value Ref Range Status  01/16/2023 89.1 80.0 - 100.0 fL Final   No results found for: "PLTCOUNTKUC", "LABPLAT", "POCPLA" RDW  Date Value Ref Range Status  01/16/2023 12.3 11.0 - 15.0 % Final         Passed - Cr in normal range and within 360 days    Creat  Date Value Ref Range Status  01/16/2023 0.88 0.50 - 1.05 mg/dL Final   Creatinine,U  Date Value Ref Range Status  12/15/2018 61.2 mg/dL Final   Creatinine, Urine  Date Value Ref Range Status  04/06/2022 75 20 - 275 mg/dL Final         Passed - eGFR in normal range and within 360 days    GFR, Est African American  Date Value Ref Range Status  09/27/2016 87 >=60 mL/min Final   GFR, Est Non African American  Date Value Ref Range Status  09/27/2016 76 >=60 mL/min Final   GFR  Date Value Ref Range Status   12/17/2019 65.14 >60.00 mL/min Final   eGFR  Date Value Ref Range Status  01/16/2023 73 > OR = 60 mL/min/1.88m2 Final         Passed - B12 Level in normal range and within 720 days    Vitamin B-12  Date Value Ref Range Status  12/29/2021 565 200 - 1,100 pg/mL Final         Passed - Valid encounter within last 6 months    Recent Outpatient Visits           1 month ago Erythema of finger   [place] [facility] [place], [place], NP   2 months ago Type 2 diabetes mellitus with hyperglycemia, with long-term current use of insulin [facility])   [place] [facility] [place], [place], NP   5 months ago Type 2 diabetes mellitus with hyperglycemia, with long-term current use of insulin [facility])   [place] [facility] [place], Salvadore Oxford, NP   5 months ago Acute non-recurrent ethmoidal sinusitis   [facility] [place], [place], NP   8 months ago Localized osteoporosis without current pathological fracture   [place] [facility] [place], Salvadore Oxford, NP       Future Appointments             In 1 month Baity, Salvadore Oxford, NP [place] [facility]             traZODone (DESYREL) 50 MG tablet [Pharmacy Med Name: TRAZODONE 50 MG TABLET] 90 tablet 1    Sig: TAKE 1/2 TO 1 TABLET BY MOUTH AT BEDTIME AS NEEDED FOR SLEEP     Psychiatry: Antidepressants - Serotonin Modulator Passed - 03/20/2023  9:23 AM      Passed - Valid encounter within last 6 months    Recent Outpatient Visits           1 month ago Erythema of finger   [place] [facility] [place], [place], NP   2 months ago Type 2 diabetes mellitus with hyperglycemia, with long-term current use of insulin [facility])   [place] [facility] [place], [place], NP   5 months ago Type 2 diabetes mellitus with hyperglycemia, with long-term current use of insulin [facility])   [place] [facility] [place], Salvad[place] Oxford, NP   5 months ago Acute non-recurrent ethmoidal sinusitis   [place] [facility] [place], Salvadore Oxford, NP   8 months ago Localized osteoporosis without current pathological fracture   [place] [facility] [place], Salvadore Oxford, NP       Future Appointments             In 1 month Baity, Salvadore Oxford, NP [place] [facility]

## 2023-04-12 ENCOUNTER — Ambulatory Visit (INDEPENDENT_AMBULATORY_CARE_PROVIDER_SITE_OTHER): Payer: PPO

## 2023-04-12 ENCOUNTER — Other Ambulatory Visit: Payer: Self-pay | Admitting: Internal Medicine

## 2023-04-12 DIAGNOSIS — Z1211 Encounter for screening for malignant neoplasm of colon: Secondary | ICD-10-CM

## 2023-04-12 DIAGNOSIS — Z780 Asymptomatic menopausal state: Secondary | ICD-10-CM

## 2023-04-12 DIAGNOSIS — Z0000 Encounter for general adult medical examination without abnormal findings: Secondary | ICD-10-CM

## 2023-04-12 NOTE — Progress Notes (Signed)
Subjective:   [name] is a 67 y.o. female who presents for Medicare Annual (Subsequent) preventive examination.  Visit Complete: Virtual I connected with  [name] on 04/12/23 by a audio enabled telemedicine application and verified that I am speaking with the correct person using two identifiers.  Patient Location: Home  Provider Location: Office/Clinic  I discussed the limitations of evaluation and management by telemedicine. The patient expressed understanding and agreed to proceed.  Vital Signs: Because this visit was a virtual/telehealth visit, some criteria may be missing or patient reported. Any vitals not documented were not able to be obtained and vitals that have been documented are patient reported.  Cardiac Risk Factors include: advanced age (>35men, >62 women);diabetes mellitus;dyslipidemia     Objective:    There were no vitals filed for this visit. There is no height or weight on file to calculate BMI.     04/12/2023    9:53 AM 07/23/2022    9:05 AM 04/06/2022   10:19 AM 01/12/2022    8:43 AM 06/18/2016    9:23 AM 04/18/2016    8:30 AM 01/30/2011    9:30 AM  Advanced Directives  Does Patient Have a Medical Advance Directive? No Yes No Yes No No Patient does not have advance directive  Type of Advance Directive  Living will       Does patient want to make changes to medical advance directive?    No - Patient declined     Would patient like information on creating a medical advance directive? No - Patient declined  No - Patient declined  No - Patient declined Yes (MAU/Ambulatory/Procedural Areas - Information given)   Pre-existing out of facility DNR order (yellow form or pink MOST form)       No    Current Medications (verified) Outpatient Encounter Medications as of 04/12/2023  Medication Sig   aspirin EC 81 MG tablet Take 81 mg by mouth daily. Swallow whole.   atorvastatin (LIPITOR) 10 MG tablet Take 1 tablet (10 mg total) by mouth daily.   B-D UF III  MINI PEN NEEDLES 31G X 5 MM MISC Use 4x a day   Blood Glucose Monitoring Suppl (ONETOUCH VERIO) w/Device KIT Use as instructed to check blood sugar 3X daily   CALCIUM PO Take 1,200 mg by mouth daily.   Cholecalciferol (VITAMIN D-3) 25 MCG (1000 UT) CAPS Take 1,000 Units by mouth daily.   ezetimibe (ZETIA) 10 MG tablet TAKE 1 TABLET BY MOUTH EVERY DAY   fenofibrate (TRICOR) 145 MG tablet Take 1 tablet (145 mg total) by mouth daily.   glipiZIDE (GLUCOTROL) 10 MG tablet Take 0.5 tablets (5 mg total) by mouth 2 (two) times daily before a meal.   glucose blood (ONETOUCH VERIO) test strip USE AS DIRECTED TO CHECK BLOOD SUGAR 3 TIMES A DAY   insulin glargine (LANTUS SOLOSTAR) 100 UNIT/ML Solostar Pen INJECT 44 UNITS INTO THE SKIN DAILY   metFORMIN (GLUCOPHAGE) 1000 MG tablet TAKE 1 TABLET (1,000 MG TOTAL) BY MOUTH TWICE A DAY WITH FOOD   Multiple Vitamins-Minerals (MULTIVITAMINS THER. W/MINERALS) TABS Take 1 tablet by mouth daily.   mupirocin ointment (BACTROBAN) 2 % Apply 1 Application topically 2 (two) times daily.   Omega-3 Fatty Acids (FISH OIL) 1000 MG CAPS Take 1,000 mg by mouth 2 (two) times daily.   ondansetron (ZOFRAN-ODT) 8 MG disintegrating tablet 8mg  ODT q4 hours prn nausea   OZEMPIC, 1 MG/DOSE, 4 MG/3ML SOPN Inject 1 mg into the  skin once a week.   pantoprazole (PROTONIX) 40 MG tablet TAKE 1 TABLET BY MOUTH TWICE A DAY   Biotin 5 MG CAPS Take by mouth. (Patient not taking: Reported on 04/12/2023)   Zinc Gluconate-Vitamin C (BL ZINC-VITAMIN C LOZENGE MT) Use as directed 1 tablet in the mouth or throat daily. (Patient not taking: Reported on 04/12/2023)   [DISCONTINUED] cephALEXin (KEFLEX) 500 MG capsule Take 1 capsule (500 mg total) by mouth 3 (three) times daily. (Patient not taking: Reported on 04/12/2023)   [DISCONTINUED] traZODone (DESYREL) 50 MG tablet TAKE 1/2 TO 1 TABLET BY MOUTH AT BEDTIME AS NEEDED FOR SLEEP (Patient not taking: Reported on 04/12/2023)   No facility-administered  encounter medications on file as of 04/12/2023.    Allergies (verified) Jardiance [empagliflozin] and Sulfa antibiotics   History: Past Medical History:  Diagnosis Date   Blood transfusion    1984   [facility]   Chronic kidney disease    occasional renal stone   Complication of anesthesia    woke during surgery (ablation) 2006?   Diabetes mellitus    type 2 Diab- A1C-7   Encounter for general adult medical examination with abnormal findings 02/02/2022   GERD (gastroesophageal reflux disease)    occasional Protonix-will take protonix night before surgery   Hypercholesteremia    on meds   Past Surgical History:  Procedure Laterality Date   BLADDER SUSPENSION  12/08/2010   Procedure: TRANSVAGINAL TAPE (TVT) PROCEDURE;  Surgeon: Melony Overly;  Location: [facility];  Service: Gynecology;  Laterality: N/A;  transvaginal tape with cystoscopy   BLADDER SUSPENSION  01/30/2011   Procedure: TRANSVAGINAL TAPE (TVT) PROCEDURE;  Surgeon: Melony Overly;  Location: [facility];  Service: Gynecology;  Laterality: N/A;  excision of excess sling from suprapubic incision and cystoscopy   CERVICAL DISC SURGERY  05/07/2002   CYSTOSCOPY  01/30/2011   Procedure: CYSTOSCOPY;  Surgeon: Melony Overly;  Location: [facility];  Service: Gynecology;  Laterality: N/A;   NECK SURGERY     2005   TONSILLECTOMY  05/07/1974   transvaginal tape  12/06/2010   uterine ablation     Family History  Problem Relation Age of Onset   Diabetes Mother    Hypertension Mother    Hyperlipidemia Mother    Heart disease Mother    Heart disease Father    Hyperlipidemia Father    Diabetes Father    Heart attack Father    Varicose Veins Father    Peripheral vascular disease Father        amputation   Cancer Paternal Aunt        lung/ brain   Diabetes Maternal Grandmother    Social History   Socioeconomic History   Marital status: Widowed    Spouse name: Not on file   Number of children: Not on file   Years of education: Not on  file   Highest education level: 12th grade  Occupational History   Not on file  Tobacco Use   Smoking status: Former    Current packs/day: 0.75    Average packs/day: 0.8 packs/day for 30.0 years (22.5 ttl pk-yrs)    Types: Cigarettes   Smokeless tobacco: Never   Tobacco comments:    recently quit 03/01/2015  Vaping Use   Vaping status: Never Used  Substance and Sexual Activity   Alcohol use: No    Alcohol/week: 0.0 standard drinks of alcohol   Drug use: No   Sexual activity: Yes  Other Topics Concern  Not on file  Social History Narrative   Right handed   Retired   Chemical engineer Strain: Low Risk  (04/12/2023)   Overall Financial Resource Strain (CARDIA)    Difficulty of Paying Living Expenses: Not hard at all  Food Insecurity: No Food Insecurity (04/12/2023)   Hunger Vital Sign    Worried About Running Out of Food in the Last Year: Never true    Ran Out of Food in the Last Year: Never true  Transportation Needs: No Transportation Needs (04/12/2023)   PRAPARE - Administrator, Civil Service (Medical): No    Lack of Transportation (Non-Medical): No  Physical Activity: Sufficiently Active (04/12/2023)   Exercise Vital Sign    Days of Exercise per Week: 5 days    Minutes of Exercise per Session: 30 min  Stress: No Stress Concern Present (04/12/2023)   [employer] of Occupational Health - Occupational Stress Questionnaire    Feeling of Stress : Not at all  Social Connections: Moderately Isolated (04/12/2023)   Social Connection and Isolation Panel [NHANES]    Frequency of Communication with Friends and Family: More than three times a week    Frequency of Social Gatherings with Friends and Family: Twice a week    Attends Religious Services: More than 4 times per year    Active Member of [employer] or Organizations: No    Attends Banker Meetings: Never    Marital Status: Widowed    Tobacco Counseling Counseling  given: Not Answered Tobacco comments: recently quit 03/01/2015   Clinical Intake:  Pre-visit preparation completed: Yes  Pain : No/denies pain     BMI - recorded: 29.6 Nutritional Status: BMI 25 -29 Overweight Nutritional Risks: None Diabetes: Yes CBG done?: No Did pt. bring in CBG monitor from home?: No  How often do you need to have someone help you when you read instructions, pamphlets, or other written materials from your doctor or pharmacy?: 1 - Never  Interpreter Needed?: No  Information entered by :: Kennedy Bucker, LPN   Activities of Daily Living    04/12/2023    9:54 AM 01/16/2023    8:41 AM  In your present state of health, do you have any difficulty performing the following activities:  Hearing? 1 1  Vision? 0 0  Difficulty concentrating or making decisions? 0 0  Walking or climbing stairs? 0 0  Dressing or bathing? 0 0  Doing errands, shopping? 0 0  Preparing Food and eating ? N   Using the Toilet? N   In the past six months, have you accidently leaked urine? N   Do you have problems with loss of bowel control? N   Managing your Medications? N   Managing your Finances? N   Housekeeping or managing your Housekeeping? N     Patient Care Team: Lorre Munroe, NP as PCP - General (Internal Medicine) Pa, [facility]  Indicate any recent Medical Services you may have received from other than [facility] providers in the past year (date may be approximate).     Assessment:   This is a routine wellness examination for [name].  Hearing/Vision screen Hearing Screening - Comments:: Wears aids, both ears Vision Screening - Comments:: [facility]   Goals Addressed             This Visit's Progress    DIET - INCREASE WATER INTAKE         Depression  Screen    04/12/2023    9:51 AM 01/16/2023    8:41 AM 10/12/2022    9:22 AM 07/09/2022    8:51 AM 06/05/2022    8:27 AM 04/06/2022   10:17 AM 04/06/2022    8:49 AM  PHQ 2/9 Scores  PHQ - 2  Score 0 0 1 1 0 0 3  PHQ- 9 Score 0   7 5 0 9    Fall Risk    04/12/2023    9:54 AM 01/16/2023    8:41 AM 10/12/2022    9:22 AM 07/23/2022    9:05 AM 07/09/2022    8:52 AM  Fall Risk   Falls in the past year? 1 0 0 0 0  Number falls in past yr: 0   0   Injury with Fall? 0 0 0 0 0  Risk for fall due to : History of fall(s) No Fall Risks No Fall Risks  No Fall Risks  Follow up Falls prevention discussed;Falls evaluation completed   Falls evaluation completed     MEDICARE RISK AT HOME: Medicare Risk at Home Any stairs in or around the home?: No If so, are there any without handrails?: No Home free of loose throw rugs in walkways, pet beds, electrical cords, etc?: Yes Adequate lighting in your home to reduce risk of falls?: Yes Life alert?: No Use of a cane, walker or w/c?: No Grab bars in the bathroom?: No Shower chair or bench in shower?: Yes Elevated toilet seat or a handicapped toilet?: Yes  TIMED UP AND GO:  Was the test performed?  No    Cognitive Function:        04/12/2023   10:06 AM 04/06/2022   10:24 AM  6CIT Screen  What Year? 0 points 0 points  What month? 0 points 0 points  What time? 0 points 0 points  Count back from 20 0 points 0 points  Months in reverse 0 points 0 points  Repeat phrase 0 points 0 points  Total Score 0 points 0 points    Immunizations Immunization History  Administered Date(s) Administered   Influenza Inj Mdck Quad Pf 02/13/2018   Influenza, Seasonal, Injecte, Preservative Fre 01/16/2023   Influenza,inj,Quad PF,6+ Mos 02/01/2015, 01/16/2017, 02/13/2018, 02/06/2019, 02/04/2020, 01/31/2021, 02/02/2022   Influenza-Unspecified 05/07/2012, 02/04/2013, 02/04/2014, 02/07/2015   Pneumococcal Polysaccharide-23 07/05/2016, 04/06/2022   Pneumococcal-Unspecified 04/10/1995, 08/23/2009   Tdap 11/21/2011   Varicella 02/05/2011   Zoster, Live 07/05/2012    TDAP status: Due, Education has been provided regarding the importance of this vaccine.  Advised may receive this vaccine at local pharmacy or Health Dept. Aware to provide a copy of the vaccination record if obtained from local pharmacy or Health Dept. Verbalized acceptance and understanding.  Flu Vaccine status: Up to date  Pneumococcal vaccine status: Up to date  Covid-19 vaccine status: Declined, Education has been provided regarding the importance of this vaccine but patient still declined. Advised may receive this vaccine at local pharmacy or Health Dept.or vaccine clinic. Aware to provide a copy of the vaccination record if obtained from local pharmacy or Health Dept. Verbalized acceptance and understanding.  Qualifies for Shingles Vaccine? Yes   Zostavax completed Yes   Shingrix Completed?: No.    Education has been provided regarding the importance of this vaccine. Patient has been advised to call insurance company to determine out of pocket expense if they have not yet received this vaccine. Advised may also receive vaccine at local pharmacy or Health Dept.  Verbalized acceptance and understanding.  Screening Tests Health Maintenance  Topic Date Due   Lung Cancer Screening  Never done   Zoster Vaccines- Shingrix (1 of 2) 02/03/2008   Cervical Cancer Screening (HPV/Pap Cotest)  02/23/2020   DTaP/Tdap/Td (2 - Td or Tdap) 11/20/2021   Diabetic kidney evaluation - Urine ACR  04/07/2023   Pneumonia Vaccine 35+ Years old (2 of 2 - PCV) 04/07/2023   FOOT EXAM  04/07/2023   HEMOGLOBIN A1C  07/16/2023   MAMMOGRAM  11/12/2023   OPHTHALMOLOGY EXAM  12/24/2023   Colonoscopy  01/06/2024   Diabetic kidney evaluation - eGFR measurement  01/16/2024   Medicare Annual Wellness (AWV)  04/11/2024   INFLUENZA VACCINE  Completed   DEXA SCAN  Completed   Hepatitis C Screening  Completed   HIV Screening  Completed   HPV VACCINES  Aged Out   COVID-19 Vaccine  Discontinued    Health Maintenance  Health Maintenance Due  Topic Date Due   Lung Cancer Screening  Never done   Zoster  Vaccines- Shingrix (1 of 2) 02/03/2008   Cervical Cancer Screening (HPV/Pap Cotest)  02/23/2020   DTaP/Tdap/Td (2 - Td or Tdap) 11/20/2021   Diabetic kidney evaluation - Urine ACR  04/07/2023   Pneumonia Vaccine 22+ Years old (2 of 2 - PCV) 04/07/2023   FOOT EXAM  04/07/2023    Colorectal cancer screening: Type of screening: Colonoscopy. Completed 05/07/13. Repeat every 10 years- REFERRAL SENT  Mammogram status: Completed 11/12/22. Repeat every year  Bone Density status: Completed 04/13/21. Results reflect: Bone density results: OSTEOPOROSIS. Repeat every 2 years.- REFERRAL SENT  Lung Cancer Screening: (Low Dose CT Chest recommended if Age 64-80 years, 20 pack-year currently smoking OR have quit w/in 15years.) does not qualify.    Additional Screening:  Hepatitis C Screening: does qualify; Completed 07/05/16  Vision Screening: Recommended annual ophthalmology exams for early detection of glaucoma and other disorders of the eye. Is the patient up to date with their annual eye exam?  Yes  Who is the provider or what is the name of the office in which the patient attends annual eye exams? [facility] If pt is not established with a provider, would they like to be referred to a provider to establish care? No .   Dental Screening: Recommended annual dental exams for proper oral hygiene  Diabetic Foot Exam: Diabetic Foot Exam: Completed 04/06/22  Community Resource Referral / Chronic Care Management: CRR required this visit?  No   CCM required this visit?  No     Plan:     I have personally reviewed and noted the following in the patient's chart:   Medical and social history Use of alcohol, tobacco or illicit drugs  Current medications and supplements including opioid prescriptions. Patient is not currently taking opioid prescriptions. Functional ability and status Nutritional status Physical activity Advanced directives List of other physicians Hospitalizations, surgeries, and  ER visits in previous 12 months Vitals Screenings to include cognitive, depression, and falls Referrals and appointments  In addition, I have reviewed and discussed with patient certain preventive protocols, quality metrics, and best practice recommendations. A written personalized care plan for preventive services as well as general preventive health recommendations were provided to patient.     Hal Hope, LPN   16/05/958   After Visit Summary: (MyChart) Due to this being a telephonic visit, the after visit summary with patients personalized plan was offered to patient via MyChart   Nurse Notes: DEXA, COLONOSCOPY  REFERRALS SENT

## 2023-04-12 NOTE — Patient Instructions (Addendum)
[name] , Thank you for taking time to come for your Medicare Wellness Visit. I appreciate your ongoing commitment to your health goals. Please review the following plan we discussed and let me know if I can assist you in the future.   Referrals/Orders/Follow-Ups/Clinician Recommendations: BONE DENSITY SCAN & COLONOSCOPY REFERRALS SENT  You have an order for:  []   2D Mammogram  []   3D Mammogram  [x]   Bone Density     Please call for appointment:  [facility]  [address] [place] [address] [phone] [facility] [address] [place], [place] [address] [phone] [facility] [address] Geanie Logan [place], [place] [address] [phone]   Make sure to wear two-piece clothing.  No lotions, powders, or deodorants the day of the appointment. Make sure to bring picture ID and insurance card.  Bring list of medications you are currently taking including any supplements.   Schedule your Loop screening mammogram through MyChart!   Log into your MyChart account.  Go to 'Visit' (or 'Appointments' if on mobile App) --> Schedule an Appointment  Under 'Select a Reason for Visit' choose the Mammogram Screening option.  Complete the pre-visit questions and select the time and place that best fits your schedule.   This is a list of the screening recommended for you and due dates:  Health Maintenance  Topic Date Due   Screening for Lung Cancer  Never done   Zoster (Shingles) Vaccine (1 of 2) 02/03/2008   Pap with HPV screening  02/23/2020   DTaP/Tdap/Td vaccine (2 - Td or Tdap) 11/20/2021   Yearly kidney health urinalysis for diabetes  04/07/2023   Pneumonia Vaccine (2 of 2 - PCV) 04/07/2023   Complete foot exam   04/07/2023   Hemoglobin A1C  07/16/2023   Mammogram  11/12/2023   Eye exam for diabetics  12/24/2023   Colon Cancer Screening   01/06/2024   Yearly kidney function blood test for diabetes  01/16/2024   Medicare Annual Wellness Visit  04/11/2024   Flu Shot  Completed   DEXA scan (bone density measurement)  Completed   Hepatitis C Screening  Completed   HIV Screening  Completed   HPV Vaccine  Aged Out   COVID-19 Vaccine  Discontinued    Advanced directives: (ACP Link)Information on Advanced Care Planning can be found at [facility] Advance Health Care Directives Advance Health Care Directives ([email or web address]   Next Medicare Annual Wellness Visit scheduled for next year: Yes   04/17/24 @ 10:10 AM BY VIDEO

## 2023-04-15 NOTE — Telephone Encounter (Signed)
Requested Prescriptions  Pending Prescriptions Disp Refills   insulin glargine (LANTUS SOLOSTAR) 100 UNIT/ML Solostar Pen [Pharmacy Med Name: LANTUS SOLOSTAR 100 UNIT/ML] 15 mL 2    Sig: INJECT 44 UNITS INTO THE SKIN DAILY *NOT COVERED*     Endocrinology:  Diabetes - Insulins Failed - 04/12/2023 10:11 AM      Failed - HBA1C is between 0 and 7.9 and within 180 days    Hemoglobin A1C  Date Value Ref Range Status  01/16/2023 9.2 (A) 4.0 - 5.6 % Final   HbA1c POC (<> result, manual entry)  Date Value Ref Range Status  10/12/2022 8.0 4.0 - 5.6 % Final         Passed - Valid encounter within last 6 months    Recent Outpatient Visits           2 months ago Erythema of finger   [place] [facility] [place], [place] W, NP   2 months ago Type 2 diabetes mellitus with hyperglycemia, with long-term current use of insulin [facility])   [place] [facility] [place], [place], NP   6 months ago Type 2 diabetes mellitus with hyperglycemia, with long-term current use of insulin [facility])   [place] [facility] [place], Salvadore [place], NP   6 months ago Acute non-recurrent ethmoidal sinusitis   [place] [facility] [place], [place], NP   9 months ago Localized osteoporosis without current pathological fracture   [place] [facility] [place], Salvadore Oxford, NP       Future Appointments             In 1 week Sampson Si, Salvadore Oxford, NP [place] [facility]

## 2023-04-16 ENCOUNTER — Other Ambulatory Visit: Payer: Self-pay

## 2023-04-16 ENCOUNTER — Telehealth: Payer: Self-pay

## 2023-04-16 DIAGNOSIS — Z1211 Encounter for screening for malignant neoplasm of colon: Secondary | ICD-10-CM

## 2023-04-16 MED ORDER — NA SULFATE-K SULFATE-MG SULF 17.5-3.13-1.6 GM/177ML PO SOLN: 1.0000 | Freq: Once | ORAL | 0 refills | Status: AC

## 2023-04-16 NOTE — Telephone Encounter (Signed)
Gastroenterology Pre-Procedure Review  Request Date: 05/20/23 Requesting Physician: Dr. Servando Snare  PATIENT REVIEW QUESTIONS: The patient responded to the following health history questions as indicated:    1. Are you having any GI issues? no 2. Do you have a personal history of Polyps? no 3. Do you have a family history of Colon Cancer or Polyps? no 4. Diabetes Mellitus? yes (takes Glipizide has been advised to stop (1) day prior to colonoscopy, stop metformin (2) days prior to colonoscopy, take half the dose of insulin the day before, stop ozempic (7) days prior to colonoscopy) 5. Joint replacements in the past 12 months?no 6. Major health problems in the past 3 months?no 7. Any artificial heart valves, MVP, or defibrillator?no    MEDICATIONS & ALLERGIES:    Patient reports the following regarding taking any anticoagulation/antiplatelet therapy:   Plavix, Coumadin, Eliquis, Xarelto, Lovenox, Pradaxa, Brilinta, or Effient? no Aspirin? yes (81 mg daily)  Patient confirms/reports the following medications:  Current Outpatient Medications  Medication Sig Dispense Refill   aspirin EC 81 MG tablet Take 81 mg by mouth daily. Swallow whole.     atorvastatin (LIPITOR) 10 MG tablet Take 1 tablet (10 mg total) by mouth daily. 90 tablet 1   B-D UF III MINI PEN NEEDLES 31G X 5 MM MISC Use 4x a day 200 each 3   Biotin 5 MG CAPS Take by mouth. (Patient not taking: Reported on 04/12/2023)     Blood Glucose Monitoring Suppl (ONETOUCH VERIO) w/Device KIT Use as instructed to check blood sugar 3X daily 1 kit 0   CALCIUM PO Take 1,200 mg by mouth daily.     Cholecalciferol (VITAMIN D-3) 25 MCG (1000 UT) CAPS Take 1,000 Units by mouth daily.     ezetimibe (ZETIA) 10 MG tablet TAKE 1 TABLET BY MOUTH EVERY DAY 90 tablet 3   fenofibrate (TRICOR) 145 MG tablet Take 1 tablet (145 mg total) by mouth daily. 90 tablet 1   glipiZIDE (GLUCOTROL) 10 MG tablet Take 0.5 tablets (5 mg total) by mouth 2 (two) times daily  before a meal. 180 tablet 1   glucose blood (ONETOUCH VERIO) test strip USE AS DIRECTED TO CHECK BLOOD SUGAR 3 TIMES A DAY 300 strip 0   insulin glargine (LANTUS SOLOSTAR) 100 UNIT/ML Solostar Pen INJECT 44 UNITS INTO THE SKIN DAILY *NOT COVERED* 15 mL 2   metFORMIN (GLUCOPHAGE) 1000 MG tablet TAKE 1 TABLET (1,000 MG TOTAL) BY MOUTH TWICE A DAY WITH FOOD 180 tablet 1   Multiple Vitamins-Minerals (MULTIVITAMINS THER. W/MINERALS) TABS Take 1 tablet by mouth daily.     mupirocin ointment (BACTROBAN) 2 % Apply 1 Application topically 2 (two) times daily. 22 g 0   Omega-3 Fatty Acids (FISH OIL) 1000 MG CAPS Take 1,000 mg by mouth 2 (two) times daily.     ondansetron (ZOFRAN-ODT) 8 MG disintegrating tablet 8mg  ODT q4 hours prn nausea 30 tablet 0   OZEMPIC, 1 MG/DOSE, 4 MG/3ML SOPN Inject 1 mg into the skin once a week. 9 mL 2   pantoprazole (PROTONIX) 40 MG tablet TAKE 1 TABLET BY MOUTH TWICE A DAY 180 tablet 1   Zinc Gluconate-Vitamin C (BL ZINC-VITAMIN C LOZENGE MT) Use as directed 1 tablet in the mouth or throat daily. (Patient not taking: Reported on 04/12/2023)     No current facility-administered medications for this visit.    Patient confirms/reports the following allergies:  Allergies  Allergen Reactions   Jardiance [Empagliflozin]     Myalgias  Sulfa Antibiotics     Other reaction(s): Edema    No orders of the defined types were placed in this encounter.   AUTHORIZATION INFORMATION Primary Insurance: 1D#: Group #:  Secondary Insurance: 1D#: Group #:  SCHEDULE INFORMATION: Date: 05/20/23 Time: Location: [facility]

## 2023-04-21 ENCOUNTER — Other Ambulatory Visit: Payer: Self-pay | Admitting: Internal Medicine

## 2023-04-23 ENCOUNTER — Ambulatory Visit (INDEPENDENT_AMBULATORY_CARE_PROVIDER_SITE_OTHER): Payer: PPO | Admitting: Internal Medicine

## 2023-04-23 ENCOUNTER — Encounter: Payer: Self-pay | Admitting: Internal Medicine

## 2023-04-23 VITALS — BP 126/74 | HR 81 | Ht 64.75 in | Wt 179.0 lb

## 2023-04-23 DIAGNOSIS — Z794 Long term (current) use of insulin: Secondary | ICD-10-CM

## 2023-04-23 DIAGNOSIS — Z6830 Body mass index (BMI) 30.0-30.9, adult: Secondary | ICD-10-CM

## 2023-04-23 DIAGNOSIS — E66811 Obesity, class 1: Secondary | ICD-10-CM

## 2023-04-23 DIAGNOSIS — E6609 Other obesity due to excess calories: Secondary | ICD-10-CM

## 2023-04-23 DIAGNOSIS — E1165 Type 2 diabetes mellitus with hyperglycemia: Secondary | ICD-10-CM | POA: Diagnosis not present

## 2023-04-23 DIAGNOSIS — Z0001 Encounter for general adult medical examination with abnormal findings: Secondary | ICD-10-CM

## 2023-04-23 MED ORDER — SCOPOLAMINE 1 MG/3DAYS TD PT72: 1.0000 | MEDICATED_PATCH | TRANSDERMAL | 0 refills | Status: DC

## 2023-04-23 NOTE — Telephone Encounter (Signed)
Requested Prescriptions  Pending Prescriptions Disp Refills   atorvastatin (LIPITOR) 10 MG tablet [Pharmacy Med Name: ATORVASTATIN 10 MG TABLET] 90 tablet 2    Sig: TAKE 1 TABLET BY MOUTH EVERY DAY     Cardiovascular:  Antilipid - Statins Failed - 04/23/2023  7:51 AM      Failed - Lipid Panel in normal range within the last 12 months    Cholesterol  Date Value Ref Range Status  01/16/2023 155 <200 mg/dL Final   LDL Cholesterol (Calc)  Date Value Ref Range Status  01/16/2023 67 mg/dL (calc) Final    Comment:    Reference range: <100 . Desirable range <100 mg/dL for primary prevention;   <70 mg/dL for patients with CHD or diabetic patients  with > or = 2 CHD risk factors. Marland Kitchen LDL-C is now calculated using the Martin-Hopkins  calculation, which is a validated novel method providing  better accuracy than the Friedewald equation in the  estimation of LDL-C.  Horald Pollen et al. Lenox Ahr. 0960;454(09): 2061-2068  (http://education.[facility].com/faq/FAQ164)    Direct LDL  Date Value Ref Range Status  12/17/2019 63.0 mg/dL Final    Comment:    Optimal:  <100 mg/dLNear or Above Optimal:  100-129 mg/dLBorderline High:  130-159 mg/dLHigh:  160-189 mg/dLVery High:  >190 mg/dL   HDL  Date Value Ref Range Status  01/16/2023 54 > OR = 50 mg/dL Final   Triglycerides  Date Value Ref Range Status  01/16/2023 256 (H) <150 mg/dL Final    Comment:    . If a non-fasting specimen was collected, consider repeat triglyceride testing on a fasting specimen if clinically indicated.  Perry Mount et al. J. of Clin. Lipidol. 2015;9:129-169. Marland Kitchen          Passed - Patient is not pregnant      Passed - Valid encounter within last 12 months    Recent Outpatient Visits           2 months ago Erythema of finger   [place] [facility] [place], [place] W, NP   3 months ago Type 2 diabetes mellitus with hyperglycemia, with long-term current use of insulin [facility])   [place] [facility] [place], [place] W, NP   6 months ago Type 2 diabetes mellitus with hyperglycemia, with long-term current use of insulin [facility])   [place] [facility] [place], Salvadore Oxford, NP   6 months ago Acute non-recurrent ethmoidal sinusitis   [place] [facility] [place], Salvadore Oxford, NP   9 months ago Localized osteoporosis without current pathological fracture   [place] [facility] [place], Salvadore Oxford, NP       Future Appointments             Today Sampson Si, Salvadore Oxford, NP  [facility]

## 2023-04-23 NOTE — Assessment & Plan Note (Signed)
Encouraged diet and exercise for weight loss ?

## 2023-04-23 NOTE — Progress Notes (Signed)
Subjective:    Patient ID: [name], female    DOB: 1955/05/29, 67 y.o.   MRN: [id number]  HPI  Patient presents to clinic today for annual exam.  Flu: 01/2023 Tetanus: 11/2011 COVID: Never Pneumovax: 04/2022 Shingrix: Never Zostavax: 07/2012 Pap smear: 02/2017, [facility] Mammogram: 11/2022 Bone density: 04/2021 Colon screening: 05/2013, scheduled Vision screening: annually Dentist: biannually  Diet: She does eat meat. She consumes fruits and veggies. She tries to avoid fried foods. She drinks moslty water. Exercise: rowing machine 30 minutes daily  Review of Systems     Past Medical History:  Diagnosis Date   Blood transfusion    1984   [facility]   Chronic kidney disease    occasional renal stone   Complication of anesthesia    woke during surgery (ablation) 2006?   Diabetes mellitus    type 2 Diab- A1C-7   Encounter for general adult medical examination with abnormal findings 02/02/2022   GERD (gastroesophageal reflux disease)    occasional Protonix-will take protonix night before surgery   Hypercholesteremia    on meds    Current Outpatient Medications  Medication Sig Dispense Refill   aspirin EC 81 MG tablet Take 81 mg by mouth daily. Swallow whole.     atorvastatin (LIPITOR) 10 MG tablet TAKE 1 TABLET BY MOUTH EVERY DAY 90 tablet 2   B-D UF III MINI PEN NEEDLES 31G X 5 MM MISC Use 4x a day 200 each 3   Biotin 5 MG CAPS Take by mouth. (Patient not taking: Reported on 04/12/2023)     Blood Glucose Monitoring Suppl (ONETOUCH VERIO) w/Device KIT Use as instructed to check blood sugar 3X daily 1 kit 0   CALCIUM PO Take 1,200 mg by mouth daily.     Cholecalciferol (VITAMIN D-3) 25 MCG (1000 UT) CAPS Take 1,000 Units by mouth daily.     diazepam (VALIUM) 2 MG tablet Take by mouth.     ezetimibe (ZETIA) 10 MG tablet TAKE 1 TABLET BY MOUTH EVERY DAY 90 tablet 3   fenofibrate (TRICOR) 145 MG tablet Take 1 tablet (145 mg total) by mouth daily. 90 tablet 1    Ferrous Sulfate (IRON PO) Take by mouth.     glipiZIDE (GLUCOTROL) 10 MG tablet Take 0.5 tablets (5 mg total) by mouth 2 (two) times daily before a meal. 180 tablet 1   glucose blood (ONETOUCH VERIO) test strip USE AS DIRECTED TO CHECK BLOOD SUGAR 3 TIMES A DAY 300 strip 0   insulin glargine (LANTUS SOLOSTAR) 100 UNIT/ML Solostar Pen INJECT 44 UNITS INTO THE SKIN DAILY *NOT COVERED* 15 mL 2   metFORMIN (GLUCOPHAGE) 1000 MG tablet TAKE 1 TABLET (1,000 MG TOTAL) BY MOUTH TWICE A DAY WITH FOOD 180 tablet 1   Multiple Vitamins-Minerals (MULTIVITAMINS THER. W/MINERALS) TABS Take 1 tablet by mouth daily.     mupirocin ointment (BACTROBAN) 2 % Apply 1 Application topically 2 (two) times daily. 22 g 0   Omega-3 Fatty Acids (FISH OIL) 1000 MG CAPS Take 1,000 mg by mouth 2 (two) times daily.     ondansetron (ZOFRAN-ODT) 8 MG disintegrating tablet 8mg  ODT q4 hours prn nausea 30 tablet 0   OZEMPIC, 1 MG/DOSE, 4 MG/3ML SOPN Inject 1 mg into the skin once a week. 9 mL 2   pantoprazole (PROTONIX) 40 MG tablet TAKE 1 TABLET BY MOUTH TWICE A DAY 180 tablet 1   Zinc Gluconate-Vitamin C (BL ZINC-VITAMIN C LOZENGE MT) Use as directed 1 tablet  in the mouth or throat daily.     No current facility-administered medications for this visit.    Allergies  Allergen Reactions   Jardiance [Empagliflozin]     Myalgias   Sulfa Antibiotics     Other reaction(s): Edema    Family History  Problem Relation Age of Onset   Diabetes Mother    Hypertension Mother    Hyperlipidemia Mother    Heart disease Mother    Heart disease Father    Hyperlipidemia Father    Diabetes Father    Heart attack Father    Varicose Veins Father    Peripheral vascular disease Father        amputation   Cancer Paternal Aunt        lung/ brain   Diabetes Maternal Grandmother     Social History   Socioeconomic History   Marital status: Widowed    Spouse name: Not on file   Number of children: Not on file   Years of education: Not  on file   Highest education level: 12th grade  Occupational History   Not on file  Tobacco Use   Smoking status: Former    Current packs/day: 0.75    Average packs/day: 0.8 packs/day for 30.0 years (22.5 ttl pk-yrs)    Types: Cigarettes   Smokeless tobacco: Never   Tobacco comments:    recently quit 03/01/2015  Vaping Use   Vaping status: Never Used  Substance and Sexual Activity   Alcohol use: No    Alcohol/week: 0.0 standard drinks of alcohol   Drug use: No   Sexual activity: Yes  Other Topics Concern   Not on file  Social History Narrative   Right handed   Retired   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 Strain: Low Risk  (04/12/2023)   Overall Financial Resource Strain (CARDIA)    Difficulty of Paying Living Expenses: Not hard at all  Food Insecurity: No Food Insecurity (04/12/2023)   Hunger Vital Sign    Worried About Running Out of Food in the Last Year: Never true    Ran Out of Food in the Last Year: Never true  Transportation Needs: No Transportation Needs (04/12/2023)   PRAPARE - Administrator, Civil Service (Medical): No    Lack of Transportation (Non-Medical): No  Physical Activity: Sufficiently Active (04/12/2023)   Exercise Vital Sign    Days of Exercise per Week: 5 days    Minutes of Exercise per Session: 30 min  Stress: No Stress Concern Present (04/12/2023)   [employer] of Occupational Health - Occupational Stress Questionnaire    Feeling of Stress : Not at all  Social Connections: Moderately Isolated (04/12/2023)   Social Connection and Isolation Panel [NHANES]    Frequency of Communication with Friends and Family: More than three times a week    Frequency of Social Gatherings with Friends and Family: Twice a week    Attends Religious Services: More than 4 times per year    Active Member of [employer] or Organizations: No    Attends Banker Meetings: Never    Marital Status: Widowed  Intimate Partner Violence: Not At  Risk (04/12/2023)   Humiliation, Afraid, Rape, and Kick questionnaire    Fear of Current or Ex-Partner: No    Emotionally Abused: No    Physically Abused: No    Sexually Abused: No     Constitutional: Denies fever, malaise, fatigue, headache or abrupt weight changes.  HEENT: Denies  eye pain, eye redness, ear pain, ringing in the ears, wax buildup, runny nose, nasal congestion, bloody nose, or sore throat. Respiratory: Denies difficulty breathing, shortness of breath, cough or sputum production.   Cardiovascular: Denies chest pain, chest tightness, palpitations or swelling in the hands or feet.  Gastrointestinal: Denies abdominal pain, bloating, constipation, diarrhea or blood in the stool.  GU: Denies urgency, frequency, pain with urination, burning sensation, blood in urine, odor or discharge. Musculoskeletal: Patient reports joint pain.  Denies decrease in range of motion, difficulty with gait, muscle pain or joint swelling.  Skin: Denies redness, rashes, lesions or ulcercations.  Neurological: Patient reports intermittent dizziness, insomnia, intermittent paresthesia of toes.  Denies difficulty with memory, difficulty with speech or problems with balance and coordination.  Psych: Pt reports anxiety. Denies depression, SI/HI.  No other specific complaints in a complete review of systems (except as listed in HPI above).  Objective:   Physical Exam  BP 126/74   Pulse 81   Ht 5' 4.75" (1.645 m)   Wt 179 lb (81.2 kg)   SpO2 97%   BMI 30.02 kg/m    Wt Readings from Last 3 Encounters:  01/16/23 178 lb (80.7 kg)  10/12/22 179 lb (81.2 kg)  07/23/22 179 lb 12.8 oz (81.6 kg)    General: Appears her stated age, obese, in NAD. Skin: Warm, dry and intact. No ulcerations noted. HEENT: Head: normal shape and size; Eyes: sclera white, no icterus, conjunctiva pink, PERRLA and EOMs intact;  Neck:  Neck supple, trachea midline. No masses, lumps or thyromegaly present.  Cardiovascular:  Normal rate and rhythm. S1,S2 noted.  No murmur, rubs or gallops noted. No JVD or BLE edema. No carotid bruits noted. Pulmonary/Chest: Normal effort and positive vesicular breath sounds. No respiratory distress. No wheezes, rales or ronchi noted.  Abdomen: Normal bowel sounds.  Musculoskeletal: Strength 5/5 BUE/BLE.  No difficulty with gait.  Neurological: Alert and oriented. Cranial nerves II-XII grossly intact. Coordination normal.  Psychiatric: Mood and affect normal.  Mildly anxious appearing. Judgment and thought content normal.    BMET    Component Value Date/Time   NA 136 01/16/2023 0858   K 4.4 01/16/2023 0858   CL 99 01/16/2023 0858   CO2 27 01/16/2023 0858   GLUCOSE 183 (H) 01/16/2023 0858   BUN 21 01/16/2023 0858   CREATININE 0.88 01/16/2023 0858   CALCIUM 9.9 01/16/2023 0858   GFRNONAA 76 09/27/2016 0854   GFRAA 87 09/27/2016 0854    Lipid Panel     Component Value Date/Time   CHOL 155 01/16/2023 0858   TRIG 256 (H) 01/16/2023 0858   HDL 54 01/16/2023 0858   CHOLHDL 2.9 01/16/2023 0858   VLDL 60.8 (H) 12/17/2019 0901   LDLCALC 67 01/16/2023 0858    CBC    Component Value Date/Time   WBC 7.1 01/16/2023 0858   RBC 4.49 01/16/2023 0858   HGB 12.7 01/16/2023 0858   HCT 40.0 01/16/2023 0858   PLT 300 01/16/2023 0858   MCV 89.1 01/16/2023 0858   MCH 28.3 01/16/2023 0858   MCHC 31.8 (L) 01/16/2023 0858   RDW 12.3 01/16/2023 0858   LYMPHSABS 3,576 12/29/2021 1437   MONOABS 0.6 01/07/2014 1402   EOSABS 0 (L) 12/29/2021 1437   BASOSABS 64 12/29/2021 1437    Hgb A1C Lab Results  Component Value Date   HGBA1C 9.2 (A) 01/16/2023           Assessment & Plan:   Preventative Health Maintenance:  Flu shot UTD She declines tetanus for financial reasons, advised if she gets bit or cut to go get this done Pneumovax UTD Will give Prevnar 20 at next visit Zostavax UTD Discussed Shingrix vaccine, she will check coverage with her insurance company and  schedule a visit at the pharmacy if she would like to have this done Encouraged her to do her COVID-vaccine Pap smear UTD per her report, will request copy Mammogram UTD Bone density ordered-she will call to schedule Colon screening scheduled She declines lung cancer screening Encouraged her to consume a balanced diet and exercise regimen Advised her to see an eye doctor and dentist annually We will check CBC, c-Met, lipid, A1c and urine microalbumin today  RTC in 6 months follow-up chronic conditions Nicki Reaper, NP

## 2023-04-23 NOTE — Patient Instructions (Signed)
Health Maintenance for Postmenopausal Women Menopause is a normal process in which your ability to get pregnant comes to an end. This process happens slowly over many months or years, usually between the ages of 48 and 55. Menopause is complete when you have missed your menstrual period for 12 months. It is important to talk with your health care provider about some of the most common conditions that affect women after menopause (postmenopausal women). These include heart disease, cancer, and bone loss (osteoporosis). Adopting a healthy lifestyle and getting preventive care can help to promote your health and wellness. The actions you take can also lower your chances of developing some of these common conditions. What are the signs and symptoms of menopause? During menopause, you may have the following symptoms: Hot flashes. These can be moderate or severe. Night sweats. Decrease in sex drive. Mood swings. Headaches. Tiredness (fatigue). Irritability. Memory problems. Problems falling asleep or staying asleep. Talk with your health care provider about treatment options for your symptoms. Do I need hormone replacement therapy? Hormone replacement therapy is effective in treating symptoms that are caused by menopause, such as hot flashes and night sweats. Hormone replacement carries certain risks, especially as you become older. If you are thinking about using estrogen or estrogen with progestin, discuss the benefits and risks with your health care provider. How can I reduce my risk for heart disease and stroke? The risk of heart disease, heart attack, and stroke increases as you age. One of the causes may be a change in the body's hormones during menopause. This can affect how your body uses dietary fats, triglycerides, and cholesterol. Heart attack and stroke are medical emergencies. There are many things that you can do to help prevent heart disease and stroke. Watch your blood pressure High  blood pressure causes heart disease and increases the risk of stroke. This is more likely to develop in people who have high blood pressure readings or are overweight. Have your blood pressure checked: Every 3-5 years if you are 18-39 years of age. Every year if you are 40 years old or older. Eat a healthy diet  Eat a diet that includes plenty of vegetables, fruits, low-fat dairy products, and lean protein. Do not eat a lot of foods that are high in solid fats, added sugars, or sodium. Get regular exercise Get regular exercise. This is one of the most important things you can do for your health. Most adults should: Try to exercise for at least 150 minutes each week. The exercise should increase your heart rate and make you sweat (moderate-intensity exercise). Try to do strengthening exercises at least twice each week. Do these in addition to the moderate-intensity exercise. Spend less time sitting. Even light physical activity can be beneficial. Other tips Work with your health care provider to achieve or maintain a healthy weight. Do not use any products that contain nicotine or tobacco. These products include cigarettes, chewing tobacco, and vaping devices, such as e-cigarettes. If you need help quitting, ask your health care provider. Know your numbers. Ask your health care provider to check your cholesterol and your blood sugar (glucose). Continue to have your blood tested as directed by your health care provider. Do I need screening for cancer? Depending on your health history and family history, you may need to have cancer screenings at different stages of your life. This may include screening for: Breast cancer. Cervical cancer. Lung cancer. Colorectal cancer. What is my risk for osteoporosis? After menopause, you may be   at increased risk for osteoporosis. Osteoporosis is a condition in which bone destruction happens more quickly than new bone creation. To help prevent osteoporosis or  the bone fractures that can happen because of osteoporosis, you may take the following actions: If you are 19-50 years old, get at least 1,000 mg of calcium and at least 600 international units (IU) of vitamin D per day. If you are older than age 50 but younger than age 70, get at least 1,200 mg of calcium and at least 600 international units (IU) of vitamin D per day. If you are older than age 70, get at least 1,200 mg of calcium and at least 800 international units (IU) of vitamin D per day. Smoking and drinking excessive alcohol increase the risk of osteoporosis. Eat foods that are rich in calcium and vitamin D, and do weight-bearing exercises several times each week as directed by your health care provider. How does menopause affect my mental health? Depression may occur at any age, but it is more common as you become older. Common symptoms of depression include: Feeling depressed. Changes in sleep patterns. Changes in appetite or eating patterns. Feeling an overall lack of motivation or enjoyment of activities that you previously enjoyed. Frequent crying spells. Talk with your health care provider if you think that you are experiencing any of these symptoms. General instructions See your health care provider for regular wellness exams and vaccines. This may include: Scheduling regular health, dental, and eye exams. Getting and maintaining your vaccines. These include: Influenza vaccine. Get this vaccine each year before the flu season begins. Pneumonia vaccine. Shingles vaccine. Tetanus, diphtheria, and pertussis (Tdap) booster vaccine. Your health care provider may also recommend other immunizations. Tell your health care provider if you have ever been abused or do not feel safe at home. Summary Menopause is a normal process in which your ability to get pregnant comes to an end. This condition causes hot flashes, night sweats, decreased interest in sex, mood swings, headaches, or lack  of sleep. Treatment for this condition may include hormone replacement therapy. Take actions to keep yourself healthy, including exercising regularly, eating a healthy diet, watching your weight, and checking your blood pressure and blood sugar levels. Get screened for cancer and depression. Make sure that you are up to date with all your vaccines. This information is not intended to replace advice given to you by your health care provider. Make sure you discuss any questions you have with your health care provider. Document Revised: 09/12/2020 Document Reviewed: 09/12/2020 Elsevier Patient Education  2024 Elsevier Inc.  

## 2023-04-24 ENCOUNTER — Ambulatory Visit: Payer: HMO | Admitting: Neurology

## 2023-04-24 LAB — CBC
HCT: 40.1 % (ref 35.0–45.0)
Hemoglobin: 13.1 g/dL (ref 11.7–15.5)
MCH: 28.9 pg (ref 27.0–33.0)
MCHC: 32.7 g/dL (ref 32.0–36.0)
MCV: 88.5 fL (ref 80.0–100.0)
MPV: 11.8 fL (ref 7.5–12.5)
Platelets: 343 10*3/uL (ref 140–400)
RBC: 4.53 10*6/uL (ref 3.80–5.10)
RDW: 12.7 % (ref 11.0–15.0)
WBC: 9 10*3/uL (ref 3.8–10.8)

## 2023-04-24 LAB — LIPID PANEL
Cholesterol: 160 mg/dL (ref <200)
HDL: 63 mg/dL (ref >=50)
LDL Cholesterol (Calc): 69 mg/dL
Non-HDL Cholesterol (Calc): 97 mg/dL (ref <130)
Total CHOL/HDL Ratio: 2.5 (calc) (ref <5.0)
Triglycerides: 221 mg/dL — ABNORMAL HIGH (ref <150)

## 2023-04-24 LAB — HEMOGLOBIN A1C
Hgb A1c MFr Bld: 8 %{Hb} — ABNORMAL HIGH (ref <5.7)
Mean Plasma Glucose: 183 mg/dL
eAG (mmol/L): 10.1 mmol/L

## 2023-04-24 LAB — COMPLETE METABOLIC PANEL WITH GFR
AG Ratio: 1.7 (calc) (ref 1.0–2.5)
ALT: 26 U/L (ref 6–29)
AST: 20 U/L (ref 10–35)
Albumin: 4.4 g/dL (ref 3.6–5.1)
Alkaline phosphatase (APISO): 58 U/L (ref 37–153)
BUN: 18 mg/dL (ref 7–25)
CO2: 27 mmol/L (ref 20–32)
Calcium: 10.4 mg/dL (ref 8.6–10.4)
Chloride: 100 mmol/L (ref 98–110)
Creat: 0.9 mg/dL (ref 0.50–1.05)
Globulin: 2.6 g/dL (ref 1.9–3.7)
Glucose, Bld: 124 mg/dL — ABNORMAL HIGH (ref 65–99)
Potassium: 4.4 mmol/L (ref 3.5–5.3)
Sodium: 138 mmol/L (ref 135–146)
Total Bilirubin: 0.4 mg/dL (ref 0.2–1.2)
Total Protein: 7 g/dL (ref 6.1–8.1)
eGFR: 71 mL/min/{1.73_m2} (ref >=60)

## 2023-04-24 LAB — MICROALBUMIN / CREATININE URINE RATIO
Creatinine, Urine: 112 mg/dL (ref 20–275)
Microalb Creat Ratio: 7 mg/g{creat} (ref <30)
Microalb, Ur: 0.8 mg/dL

## 2023-05-13 ENCOUNTER — Encounter: Payer: Self-pay | Admitting: Gastroenterology

## 2023-05-13 NOTE — Anesthesia Preprocedure Evaluation (Addendum)
 Anesthesia Evaluation    Reviewed: Allergy & Precautions, H&P , Patient's Chart, lab work & pertinent test results  History of Anesthesia Complications (+) PONV and history of anesthetic complications  Airway Mallampati: II  TM Distance: >3 FB Neck ROM: full    Dental no notable dental hx. (+) Teeth Intact, Dental Advidsory Given   Pulmonary neg pulmonary ROS, former smoker   Pulmonary exam normal breath sounds clear to auscultation       Cardiovascular Normal cardiovascular exam Rhythm:regular Rate:Normal     Neuro/Psych negative neurological ROS  negative psych ROS   GI/Hepatic   Endo/Other  diabetes, Well Controlled, Type 2    Renal/GU      Musculoskeletal   Abdominal   Peds  Hematology   Anesthesia Other Findings Complication of anesthesia--woke during ablation,  Blood transfusion Chronic kidney disease Hypercholesteremia Diabetes mellitus GERD (gastroesophageal reflux disease) PONV (postoperative nausea and vomiting Deaf, right Family history of adverse reaction to anesthesia Vertigo Wears hearing aid in both ears   Reproductive/Obstetrics                             Anesthesia Physical Anesthesia Plan  ASA: 2  Anesthesia Plan: General   Post-op Pain Management: Minimal or no pain anticipated   Induction: Intravenous  PONV Risk Score and Plan: 3 and Propofol  infusion, TIVA and Ondansetron   Airway Management Planned: Nasal Cannula  Additional Equipment: None  Intra-op Plan:   Post-operative Plan:   Informed Consent: I have reviewed the patients History and Physical, chart, labs and discussed the procedure including the risks, benefits and alternatives for the proposed anesthesia with the patient or authorized representative who has indicated his/her understanding and acceptance.     Dental advisory given  Plan Discussed with: CRNA and Surgeon  Anesthesia Plan  Comments: (Discussed risks of anesthesia with patient, including possibility of difficulty with spontaneous ventilation under anesthesia necessitating airway intervention, PONV, and rare risks such as cardiac or respiratory or neurological events, and allergic reactions. Discussed the role of CRNA in patient's perioperative care. Patient understands.)       Anesthesia Quick Evaluation

## 2023-05-20 ENCOUNTER — Encounter: Payer: Self-pay | Admitting: Gastroenterology

## 2023-05-20 ENCOUNTER — Other Ambulatory Visit: Payer: Self-pay

## 2023-05-20 ENCOUNTER — Ambulatory Visit
Admission: RE | Admit: 2023-05-20 | Discharge: 2023-05-20 | Disposition: A | Discharge status: Home / Self Care | Payer: HMO | Attending: Gastroenterology | Admitting: Gastroenterology

## 2023-05-20 ENCOUNTER — Encounter
Admission: RE | Disposition: A | Discharge status: Home / Self Care | Payer: Self-pay | Source: Home / Self Care | Attending: Gastroenterology

## 2023-05-20 ENCOUNTER — Ambulatory Visit: Payer: HMO | Admitting: Anesthesiology

## 2023-05-20 DIAGNOSIS — N189 Chronic kidney disease, unspecified: Secondary | ICD-10-CM | POA: Diagnosis not present

## 2023-05-20 DIAGNOSIS — K640 First degree hemorrhoids: Secondary | ICD-10-CM | POA: Diagnosis not present

## 2023-05-20 DIAGNOSIS — Z7985 Long-term (current) use of injectable non-insulin antidiabetic drugs: Secondary | ICD-10-CM | POA: Diagnosis not present

## 2023-05-20 DIAGNOSIS — Z1211 Encounter for screening for malignant neoplasm of colon: Secondary | ICD-10-CM | POA: Diagnosis not present

## 2023-05-20 DIAGNOSIS — K219 Gastro-esophageal reflux disease without esophagitis: Secondary | ICD-10-CM | POA: Insufficient documentation

## 2023-05-20 DIAGNOSIS — Z794 Long term (current) use of insulin: Secondary | ICD-10-CM | POA: Insufficient documentation

## 2023-05-20 DIAGNOSIS — E1122 Type 2 diabetes mellitus with diabetic chronic kidney disease: Secondary | ICD-10-CM | POA: Insufficient documentation

## 2023-05-20 DIAGNOSIS — Z7984 Long term (current) use of oral hypoglycemic drugs: Secondary | ICD-10-CM | POA: Diagnosis not present

## 2023-05-20 DIAGNOSIS — Z87891 Personal history of nicotine dependence: Secondary | ICD-10-CM | POA: Insufficient documentation

## 2023-05-20 HISTORY — DX: Presence of external hearing-aid: Z97.4

## 2023-05-20 HISTORY — DX: Nausea with vomiting, unspecified: R11.2

## 2023-05-20 HISTORY — DX: Other specified postprocedural states: Z98.890

## 2023-05-20 HISTORY — DX: Unspecified hearing loss, right ear: H91.91

## 2023-05-20 HISTORY — DX: Dizziness and giddiness: R42

## 2023-05-20 HISTORY — DX: Family history of other specified conditions: Z84.89

## 2023-05-20 HISTORY — PX: COLONOSCOPY WITH PROPOFOL: SHX5780

## 2023-05-20 LAB — GLUCOSE, CAPILLARY: Glucose-Capillary: 219 mg/dL — ABNORMAL HIGH (ref 70–99)

## 2023-05-20 SURGERY — COLONOSCOPY WITH PROPOFOL
Anesthesia: General | Site: Rectum

## 2023-05-20 MED ORDER — PROPOFOL 10 MG/ML IV BOLUS
INTRAVENOUS | Status: DC | PRN
  Administered 2023-05-20 (×2): 50 mg via INTRAVENOUS
  Administered 2023-05-20: 40 mg via INTRAVENOUS
  Administered 2023-05-20: 90 mg via INTRAVENOUS
  Administered 2023-05-20: 50 mg via INTRAVENOUS

## 2023-05-20 MED ORDER — STERILE WATER FOR IRRIGATION IR SOLN
Status: DC | PRN
  Administered 2023-05-20: 50 mL

## 2023-05-20 MED ORDER — LIDOCAINE HCL (CARDIAC) PF 100 MG/5ML IV SOSY
PREFILLED_SYRINGE | INTRAVENOUS | Status: DC | PRN
  Administered 2023-05-20: 50 mg via INTRAVENOUS

## 2023-05-20 MED ORDER — SODIUM CHLORIDE 0.9 % IV SOLN: INTRAVENOUS | Status: DC

## 2023-05-20 MED ORDER — PROPOFOL 10 MG/ML IV BOLUS
INTRAVENOUS | Status: AC
  Filled 2023-05-20: qty 40

## 2023-05-20 MED ORDER — LACTATED RINGERS IV SOLN: INTRAVENOUS | Status: DC

## 2023-05-20 MED ORDER — SEVOFLURANE IN SOLN
RESPIRATORY_TRACT | Status: AC
  Filled 2023-05-20: qty 250

## 2023-05-20 SURGICAL SUPPLY — 4 items
GOWN CVR UNV OPN BCK APRN NK (MISCELLANEOUS) ×2 IMPLANT
KIT PRC NS LF DISP ENDO (KITS) ×1 IMPLANT
MANIFOLD NEPTUNE II (INSTRUMENTS) ×1 IMPLANT
WATER STERILE IRR 250ML POUR (IV SOLUTION) ×1 IMPLANT

## 2023-05-20 NOTE — H&P (Signed)
 [name] Copping, MD [facility] [address] [place], [place] [address] Phone: [phone] Fax : [phone]  Primary Care Physician:  Antonette Angeline ORN, NP Primary Gastroenterologist:  Dr. Copping  Pre-Procedure History & Physical: HPI:  [name] is a 68 y.o. female is here for a screening colonoscopy.   Past Medical History:  Diagnosis Date   Blood transfusion    1984   [facility]   Chronic kidney disease    occasional renal stone   Complication of anesthesia    woke during surgery (ablation) 2006?   Deaf, right    Diabetes mellitus    type 2 Diab- A1C-7   Encounter for general adult medical examination with abnormal findings 02/02/2022   Family history of adverse reaction to anesthesia    Mother - PONV   GERD (gastroesophageal reflux disease)    occasional Protonix -will take protonix  night before surgery   Hypercholesteremia    on meds   PONV (postoperative nausea and vomiting)    Vertigo    Wears hearing aid in both ears     Past Surgical History:  Procedure Laterality Date   BLADDER SUSPENSION  12/08/2010   Procedure: TRANSVAGINAL TAPE (TVT) PROCEDURE;  Surgeon: Bobie DELENA Cary;  Location: [facility];  Service: Gynecology;  Laterality: N/A;  transvaginal tape with cystoscopy   BLADDER SUSPENSION  01/30/2011   Procedure: TRANSVAGINAL TAPE (TVT) PROCEDURE;  Surgeon: Bobie DELENA Cary;  Location: [facility];  Service: Gynecology;  Laterality: N/A;  excision of excess sling from suprapubic incision and cystoscopy   CERVICAL DISC SURGERY  05/07/2002   CYSTOSCOPY  01/30/2011   Procedure: CYSTOSCOPY;  Surgeon: Bobie DELENA Cary;  Location: [facility];  Service: Gynecology;  Laterality: N/A;   NECK SURGERY     2005   TONSILLECTOMY  05/07/1974   transvaginal tape  12/06/2010   uterine ablation      Prior to Admission medications   Medication Sig Start Date End Date Taking? Authorizing Provider  aspirin EC 81 MG tablet Take 81 mg by mouth daily. Swallow whole.   Yes [provider]   atorvastatin  (LIPITOR) 10 MG tablet TAKE 1 TABLET BY MOUTH EVERY DAY 04/23/23  Yes Baity, Angeline ORN, NP  Biotin 5 MG CAPS Take by mouth.   Yes [provider]  CALCIUM  PO Take 1,200 mg by mouth daily.   Yes [provider]  Cholecalciferol (VITAMIN D -3) 25 MCG (1000 UT) CAPS Take 1,000 Units by mouth daily.   Yes [provider]  diazepam (VALIUM) 2 MG tablet Take by mouth. 02/27/22  Yes [provider]  ezetimibe  (ZETIA ) 10 MG tablet TAKE 1 TABLET BY MOUTH EVERY DAY 09/17/22  Yes Baity, Angeline ORN, NP  fenofibrate  (TRICOR ) 145 MG tablet Take 1 tablet (145 mg total) by mouth daily. 12/11/22  Yes Antonette Angeline ORN, NP  Ferrous Sulfate (IRON PO) Take by mouth. 12/06/17  Yes [provider]  glipiZIDE  (GLUCOTROL ) 10 MG tablet Take 0.5 tablets (5 mg total) by mouth 2 (two) times daily before a meal. 12/10/22  Yes Trixie File, MD  glucose blood (ONETOUCH VERIO) test strip USE AS DIRECTED TO CHECK BLOOD SUGAR 3 TIMES A DAY 01/01/23  Yes Antonette Angeline ORN, NP  insulin  glargine (LANTUS  SOLOSTAR) 100 UNIT/ML Solostar Pen INJECT 44 UNITS INTO THE SKIN DAILY *NOT COVERED* 04/15/23  Yes Antonette Angeline ORN, NP  metFORMIN  (GLUCOPHAGE ) 1000 MG tablet TAKE 1 TABLET (1,000 MG TOTAL) BY MOUTH TWICE A DAY WITH FOOD 03/21/23  Yes Antonette Angeline ORN, NP  Multiple Vitamins-Minerals (MULTIVITAMINS THER. W/MINERALS) TABS Take 1 tablet by mouth daily.   Yes [provider]  Omega-3 Fatty Acids (FISH OIL) 1000 MG CAPS Take 1,000 mg by mouth 2 (two) times daily.   Yes [provider]  ondansetron  (ZOFRAN -ODT) 8 MG disintegrating tablet 8mg  ODT q4 hours prn nausea 06/05/22  Yes Baity, Angeline ORN, NP  OZEMPIC , 1 MG/DOSE, 4 MG/3ML SOPN Inject 1 mg into the skin once a week. 01/16/23  Yes Baity, Angeline ORN, NP  pantoprazole  (PROTONIX ) 40 MG tablet TAKE 1 TABLET BY MOUTH TWICE A DAY 03/15/23  Yes Baity, Angeline ORN, NP  scopolamine  (TRANSDERM-SCOP) 1 MG/3DAYS Place 1 patch (1.5 mg total) onto  the skin every 3 (three) days. For motion sickness. Start 2 hr before onset symptoms, up to 12 hr before 04/23/23  Yes Baity, Angeline ORN, NP  Zinc Gluconate-Vitamin C (BL ZINC-VITAMIN C LOZENGE MT) Use as directed 1 tablet in the mouth or throat daily.   Yes [provider]  B-D UF III MINI PEN NEEDLES 31G X 5 MM MISC Use 4x a day 02/07/22   Antonette Angeline ORN, NP  Blood Glucose Monitoring Suppl (ONETOUCH VERIO) w/Device KIT Use as instructed to check blood sugar 3X daily 06/09/21   Trixie File, MD  mupirocin  ointment (BACTROBAN ) 2 % Apply 1 Application topically 2 (two) times daily. 10/05/22   Antonette Angeline ORN, NP    Allergies as of 04/16/2023 - Review Complete 04/16/2023  Allergen Reaction Noted   Jardiance  [empagliflozin ]  01/07/2014   Sulfa antibiotics  04/03/2021    Family History  Problem Relation Age of Onset   Diabetes Mother    Hypertension Mother    Hyperlipidemia Mother    Heart disease Mother    Heart disease Father    Hyperlipidemia Father    Diabetes Father    Heart attack Father    Varicose Veins Father    Peripheral vascular disease Father        amputation   Cancer Paternal Aunt        lung/ brain   Diabetes Maternal Grandmother     Social History   Socioeconomic History   Marital status: Widowed    Spouse name: Not on file   Number of children: Not on file   Years of education: Not on file   Highest education level: 12th grade  Occupational History   Not on file  Tobacco Use   Smoking status: Former    Current packs/day: 0.00    Average packs/day: 0.8 packs/day for 30.8 years (23.1 ttl pk-yrs)    Types: Cigarettes    Start date: 3    Quit date: 03/01/2015    Years since quitting: 8.2   Smokeless tobacco: Never   Tobacco comments:    recently quit 03/01/2015  Vaping Use   Vaping status: Never Used  Substance and Sexual Activity   Alcohol use: No    Alcohol/week: 0.0 standard drinks of alcohol   Drug use: No   Sexual activity: Yes   Other Topics Concern   Not on file  Social History Narrative   Right handed   Retired   Social Drivers of Corporate Investment Banker Strain: Low Risk  (04/23/2023)   Overall Financial Resource Strain (CARDIA)    Difficulty of Paying Living Expenses: Not hard at all  Food Insecurity: No Food Insecurity (04/23/2023)   Hunger Vital Sign    Worried About Running Out of Food  in the Last Year: Never true    Ran Out of Food in the Last Year: Never true  Transportation Needs: No Transportation Needs (04/23/2023)   PRAPARE - Administrator, Civil Service (Medical): No    Lack of Transportation (Non-Medical): No  Physical Activity: Insufficiently Active (04/23/2023)   Exercise Vital Sign    Days of Exercise per Week: 4 days    Minutes of Exercise per Session: 30 min  Stress: Stress Concern Present (04/23/2023)   [employer] of Occupational Health - Occupational Stress Questionnaire    Feeling of Stress : Very much  Social Connections: Moderately Integrated (04/23/2023)   Social Connection and Isolation Panel [NHANES]    Frequency of Communication with Friends and Family: More than three times a week    Frequency of Social Gatherings with Friends and Family: Once a week    Attends Religious Services: More than 4 times per year    Active Member of [employer] or Organizations: Yes    Attends Banker Meetings: More than 4 times per year    Marital Status: Widowed  Recent Concern: Social Connections - Moderately Isolated (04/12/2023)   Social Connection and Isolation Panel [NHANES]    Frequency of Communication with Friends and Family: More than three times a week    Frequency of Social Gatherings with Friends and Family: Twice a week    Attends Religious Services: More than 4 times per year    Active Member of [employer] or Organizations: No    Attends Banker Meetings: Never    Marital Status: Widowed  Intimate Partner Violence: Not At Risk (04/23/2023)    Humiliation, Afraid, Rape, and Kick questionnaire    Fear of Current or Ex-Partner: No    Emotionally Abused: No    Physically Abused: No    Sexually Abused: No    Review of Systems: See HPI, otherwise negative ROS  Physical Exam: Ht 5' 4.75 (1.645 m)   Wt 81.2 kg   BMI 30.02 kg/m  General:   Alert,  pleasant and cooperative in NAD Head:  Normocephalic and atraumatic. Neck:  Supple; no masses or thyromegaly. Lungs:  Clear throughout to auscultation.    Heart:  Regular rate and rhythm. Abdomen:  Soft, nontender and nondistended. Normal bowel sounds, without guarding, and without rebound.   Neurologic:  Alert and  oriented x4;  grossly normal neurologically.  Impression/Plan: [name] is now here to undergo a screening colonoscopy.  Risks, benefits, and alternatives regarding colonoscopy have been reviewed with the patient.  Questions have been answered.  All parties agreeable.

## 2023-05-20 NOTE — Addendum Note (Signed)
 Addendum  created 05/20/23 0945 by Stephanie Coup, MD   Attestation recorded in Intraprocedure, Intraprocedure Attestations filed

## 2023-05-20 NOTE — Anesthesia Postprocedure Evaluation (Signed)
 Anesthesia Post Note  Patient: [name]  Procedure(s) Performed: COLONOSCOPY WITH PROPOFOL  (Rectum)  Patient location during evaluation: PACU Anesthesia Type: General Level of consciousness: awake and alert Pain management: pain level controlled Vital Signs Assessment: post-procedure vital signs reviewed and stable Respiratory status: spontaneous breathing, nonlabored ventilation, respiratory function stable and patient connected to nasal cannula oxygen Cardiovascular status: blood pressure returned to baseline and stable Postop Assessment: no apparent nausea or vomiting Anesthetic complications: no  No notable events documented.   Last Vitals:  Vitals:   05/20/23 0822 05/20/23 0830  BP: (!) 91/56 (!) 111/56  Pulse: 79 82  Resp: 18 20  Temp: (!) 35.6 C (!) 35.9 C  SpO2: 97% 95%    Last Pain:  Vitals:   05/20/23 0830  PainSc: 0-No pain                 Debby Mines

## 2023-05-20 NOTE — Op Note (Signed)
 [facility] Gastroenterology Patient Name: [name] Procedure Date: 05/20/2023 8:00 AM MRN: [id number] Account #: [id number] Date of Birth: March 19, 1956 Admit Type: Outpatient Age: 68 Room: [facility] OR ROOM 01 Gender: Female Note Status: Finalized Instrument Name: [id number] Procedure:             Colonoscopy Indications:           Screening for colorectal malignant neoplasm Providers:             Rogelia Copping MD, MD Referring MD:          Angeline MICAEL Laura (Referring MD) Medicines:             Propofol  per Anesthesia Complications:         No immediate complications. Procedure:             Pre-Anesthesia Assessment:                        - Prior to the procedure, a History and Physical was                         performed, and patient medications and allergies were                         reviewed. The patient's tolerance of previous                         anesthesia was also reviewed. The risks and benefits                         of the procedure and the sedation options and risks                         were discussed with the patient. All questions were                         answered, and informed consent was obtained. Prior                         Anticoagulants: The patient has taken no anticoagulant                         or antiplatelet agents. ASA Grade Assessment: II - A                         patient with mild systemic disease. After reviewing                         the risks and benefits, the patient was deemed in                         satisfactory condition to undergo the procedure.                        After obtaining informed consent, the colonoscope was                         passed under direct vision. Throughout the procedure,  the patient's blood pressure, pulse, and oxygen                         saturations were monitored continuously. The                         Colonoscope was introduced through the anus and                          advanced to the the cecum, identified by appendiceal                         orifice and ileocecal valve. The colonoscopy was                         performed without difficulty. The patient tolerated                         the procedure well. The quality of the bowel                         preparation was excellent. Findings:      The perianal and digital rectal examinations were normal.      Non-bleeding internal hemorrhoids were found during retroflexion. The       hemorrhoids were Grade I (internal hemorrhoids that do not prolapse). Impression:            - Non-bleeding internal hemorrhoids.                        - No specimens collected. Recommendation:        - Discharge patient to home.                        - Resume previous diet.                        - Continue present medications.                        - Repeat colonoscopy in 10 years for screening                         purposes. Procedure Code(s):     --- Professional ---                        [id number], Colonoscopy, flexible; diagnostic, including                         collection of specimen(s) by brushing or washing, when                         performed (separate procedure) Diagnosis Code(s):     --- Professional ---                        Z12.11, Encounter for screening for malignant neoplasm                         of colon CPT copyright 2022 American Medical Association. All rights reserved. The  codes documented in this report are preliminary and upon coder review may  be revised to meet current compliance requirements. Rogelia Copping MD, MD 05/20/2023 8:21:30 AM This report has been signed electronically. Number of Addenda: 0 Note Initiated On: 05/20/2023 8:00 AM Scope Withdrawal Time: 0 hours 9 minutes 22 seconds  Total Procedure Duration: 0 hours 13 minutes 41 seconds  Estimated Blood Loss:  Estimated blood loss: none.      [facility]

## 2023-05-20 NOTE — Transfer of Care (Signed)
 Immediate Anesthesia Transfer of Care Note  Patient: [name]  Procedure(s) Performed: COLONOSCOPY WITH PROPOFOL   Patient Location: PACU  Anesthesia Type: General  Level of Consciousness: awake, alert  and patient cooperative  Airway and Oxygen Therapy: Patient Spontanous Breathing and Patient connected to supplemental oxygen  Post-op Assessment: Post-op Vital signs reviewed, Patient's Cardiovascular Status Stable, Respiratory Function Stable, Patent Airway and No signs of Nausea or vomiting  Post-op Vital Signs: Reviewed and stable  Complications: No notable events documented.

## 2023-05-21 ENCOUNTER — Encounter: Payer: Self-pay | Admitting: Gastroenterology

## 2023-05-31 ENCOUNTER — Encounter: Payer: Self-pay | Admitting: Internal Medicine

## 2023-05-31 DIAGNOSIS — E1165 Type 2 diabetes mellitus with hyperglycemia: Secondary | ICD-10-CM

## 2023-05-31 NOTE — Addendum Note (Signed)
Addended by: Lorre Munroe on: 05/31/2023 11:10 AM   Modules accepted: Orders

## 2023-06-07 ENCOUNTER — Other Ambulatory Visit: Payer: Self-pay | Admitting: Internal Medicine

## 2023-06-07 NOTE — Telephone Encounter (Signed)
Request too soon for refill. Last refill 04/15/23.  Requested Prescriptions  Pending Prescriptions Disp Refills   LANTUS SOLOSTAR 100 UNIT/ML Solostar Pen [Pharmacy Med Name: LANTUS SOLOSTAR 100 UNIT/ML]      Sig: INJECT 44 UNITS INTO THE SKIN DAILY     Endocrinology:  Diabetes - Insulins Failed - 06/07/2023  4:19 PM      Failed - HBA1C is between 0 and 7.9 and within 180 days    HbA1c POC (<> result, manual entry)  Date Value Ref Range Status  10/12/2022 8.0 4.0 - 5.6 % Final   Hgb A1c MFr Bld  Date Value Ref Range Status  04/23/2023 8.0 (H) <5.7 % of total Hgb Final    Comment:    For someone without known diabetes, a hemoglobin A1c value of 6.5% or greater indicates that they may have  diabetes and this should be confirmed with a follow-up  test. . For someone with known diabetes, a value <7% indicates  that their diabetes is well controlled and a value  greater than or equal to 7% indicates suboptimal  control. A1c targets should be individualized based on  duration of diabetes, age, comorbid conditions, and  other considerations. . Currently, no consensus exists regarding use of hemoglobin A1c for diagnosis of diabetes for children. Verna Czech - Valid encounter within last 6 months    Recent Outpatient Visits           1 month ago Encounter for general adult medical examination with abnormal findings   [facility] [place], Salvadore Oxford, NP   4 months ago Erythema of finger   [place] [place], [place] W, NP   4 months ago Type 2 diabetes mellitus with hyperglycemia, with long-term current use of insulin [facility])   [facility] [place], [place], NP   7 months ago Type 2 diabetes mellitus with hyperglycemia, with long-term current use of insulin [facility])   [place] [place], Salvadore Oxford, NP   8 months ago Acute non-recurrent ethmoidal sinusitis   [facility] [place], Salvadore Oxford, NP       Future Appointments             In 4 months Baity, Salvadore Oxford, NP [facility], [facility]

## 2023-06-10 ENCOUNTER — Other Ambulatory Visit: Payer: Self-pay | Admitting: Internal Medicine

## 2023-06-11 ENCOUNTER — Other Ambulatory Visit: Payer: Self-pay | Admitting: Internal Medicine

## 2023-06-11 NOTE — Telephone Encounter (Signed)
 Copied from CRM [id number]. Topic: General - Other >> Jun 11, 2023  1:05 PM Everette C wrote: Reason for CRM: Medication Refill - Most Recent Primary Care Visit:  Provider: ANTONETTE ANGELINE ORN Department: [id number] Visit Type: PHYSICAL 20 Date: 04/23/2023  Medication: insulin  glargine (LANTUS  SOLOSTAR) 100 UNIT/ML Solostar Pen [[id number]]  Has the patient contacted their pharmacy? Yes (Agent: If no, request that the patient contact the pharmacy for the refill. If patient does not wish to contact the pharmacy document the reason why and proceed with request.) (Agent: If yes, when and what did the pharmacy advise?)  Is this the correct pharmacy for this prescription? Yes If no, delete pharmacy and type the correct one.  This is the patient's preferred pharmacy:  [facility] [phone] [place] - [address] [place] [address] Phone: [phone] Fax: [phone]   Has the prescription been filled recently? Yes  Is the patient out of the medication? Yes  Has the patient been seen for an appointment in the last year OR does the patient have an upcoming appointment? Yes  Can we respond through MyChart? No  Agent: Please be advised that Rx refills may take up to 3 business days. We ask that you follow-up with your pharmacy.

## 2023-06-11 NOTE — Telephone Encounter (Signed)
 Called pt's pharmacy and no refills left needs new order.  Requested Prescriptions  Pending Prescriptions Disp Refills   insulin  glargine (LANTUS  SOLOSTAR) 100 UNIT/ML Solostar Pen [Pharmacy Med Name: LANTUS  SOLOSTAR 100 UNIT/ML] 15 mL 1    Sig: INJECT 44 UNITS INTO THE SKIN DAILY     Endocrinology:  Diabetes - Insulins Failed - 06/11/2023  4:58 PM      Failed - HBA1C is between 0 and 7.9 and within 180 days    HbA1c POC (<> result, manual entry)  Date Value Ref Range Status  10/12/2022 8.0 4.0 - 5.6 % Final   Hgb A1c MFr Bld  Date Value Ref Range Status  04/23/2023 8.0 (H) <5.7 % of total Hgb Final    Comment:    For someone without known diabetes, a hemoglobin A1c value of 6.5% or greater indicates that they may have  diabetes and this should be confirmed with a follow-up  test. . For someone with known diabetes, a value <7% indicates  that their diabetes is well controlled and a value  greater than or equal to 7% indicates suboptimal  control. A1c targets should be individualized based on  duration of diabetes, age, comorbid conditions, and  other considerations. . Currently, no consensus exists regarding use of hemoglobin A1c for diagnosis of diabetes for children. SABRA Amy - Valid encounter within last 6 months    Recent Outpatient Visits           1 month ago Encounter for general adult medical examination with abnormal findings   [facility] [place], Angeline ORN, NP   4 months ago Erythema of finger   [place] [place], [place], NP   4 months ago Type 2 diabetes mellitus with hyperglycemia, with long-term current use of insulin  [facility])   [facility] [place], [place], NP   8 months ago Type 2 diabetes mellitus with hyperglycemia, with long-term current use of insulin  [facility])   [place] [facility] [place], Angeline ORN, NP   8 months ago Acute non-recurrent ethmoidal  sinusitis   [place] [facility] [place], Angeline ORN, NP       Future Appointments             In 4 months Baity, Angeline ORN, NP [facility]

## 2023-06-13 ENCOUNTER — Other Ambulatory Visit: Payer: Self-pay | Admitting: Internal Medicine

## 2023-06-13 DIAGNOSIS — E785 Hyperlipidemia, unspecified: Secondary | ICD-10-CM

## 2023-06-14 NOTE — Telephone Encounter (Signed)
 Requested Prescriptions  Pending Prescriptions Disp Refills   fenofibrate  (TRICOR ) 145 MG tablet [Pharmacy Med Name: FENOFIBRATE  145 MG TABLET] 90 tablet 1    Sig: TAKE 1 TABLET BY MOUTH EVERY DAY     Cardiovascular:  Antilipid - Fibric Acid Derivatives Failed - 06/14/2023  8:54 AM      Failed - Lipid Panel in normal range within the last 12 months    Cholesterol  Date Value Ref Range Status  04/23/2023 160 <200 mg/dL Final   LDL Cholesterol (Calc)  Date Value Ref Range Status  04/23/2023 69 mg/dL (calc) Final    Comment:    Reference range: <100 . Desirable range <100 mg/dL for primary prevention;   <70 mg/dL for patients with CHD or diabetic patients  with > or = 2 CHD risk factors. SABRA LDL-C is now calculated using the Martin-Hopkins  calculation, which is a validated novel method providing  better accuracy than the Friedewald equation in the  estimation of LDL-C.  [name] 7986;689(80): 2061-2068  (http://education.[facility].com/faq/FAQ164)    Direct LDL  Date Value Ref Range Status  12/17/2019 63.0 mg/dL Final    Comment:    Optimal:  <100 mg/dLNear or Above Optimal:  100-129 mg/dLBorderline High:  130-159 mg/dLHigh:  160-189 mg/dLVery High:  >190 mg/dL   HDL  Date Value Ref Range Status  04/23/2023 63 > OR = 50 mg/dL Final   Triglycerides  Date Value Ref Range Status  04/23/2023 221 (H) <150 mg/dL Final    Comment:    . If a non-fasting specimen was collected, consider repeat triglyceride testing on a fasting specimen if clinically indicated.  [name];[name]          Passed - ALT in normal range and within 360 days    ALT  Date Value Ref Range Status  04/23/2023 26 6 - 29 U/L Final         Passed - AST in normal range and within 360 days    AST  Date Value Ref Range Status  04/23/2023 20 10 - 35 U/L Final         Passed - Cr in normal range and within 360 days    Creat  Date Value Ref  Range Status  04/23/2023 0.90 0.50 - 1.05 mg/dL Final   Creatinine,U  Date Value Ref Range Status  12/15/2018 61.2 mg/dL Final   Creatinine, Urine  Date Value Ref Range Status  04/23/2023 112 20 - 275 mg/dL Final         Passed - HGB in normal range and within 360 days    Hemoglobin  Date Value Ref Range Status  04/23/2023 13.1 11.7 - 15.5 g/dL Final         Passed - HCT in normal range and within 360 days    HCT  Date Value Ref Range Status  04/23/2023 40.1 35.0 - 45.0 % Final         Passed - PLT in normal range and within 360 days    Platelets  Date Value Ref Range Status  04/23/2023 343 140 - 400 Thousand/uL Final         Passed - WBC in normal range and within 360 days    WBC  Date Value Ref Range Status  04/23/2023 9.0 3.8 - 10.8 Thousand/uL Final         Passed - eGFR is 30 or above and within 360 days  GFR, Est African American  Date Value Ref Range Status  09/27/2016 87 >=60 mL/min Final   GFR, Est Non African American  Date Value Ref Range Status  09/27/2016 76 >=60 mL/min Final   GFR  Date Value Ref Range Status  12/17/2019 65.14 >60.00 mL/min Final   eGFR  Date Value Ref Range Status  04/23/2023 71 > OR = 60 mL/min/1.25m2 Final         Passed - Valid encounter within last 12 months    Recent Outpatient Visits           1 month ago Encounter for general adult medical examination with abnormal findings   [place] [facility] [place], Angeline ORN, NP   4 months ago Erythema of finger   [facility] [place], [place] W, NP   4 months ago Type 2 diabetes mellitus with hyperglycemia, with long-term current use of insulin  [facility])   [place] [facility] [place], [place] W, NP   8 months ago Type 2 diabetes mellitus with hyperglycemia, with long-term current use of insulin  [facility])   [facility], Angeline ORN, NP   8 months ago Acute non-recurrent ethmoidal sinusitis    [facility], Angeline ORN, NP       Future Appointments             In 4 months Baity, Angeline ORN, NP [facility], [facility]

## 2023-07-05 ENCOUNTER — Other Ambulatory Visit: Payer: Self-pay | Admitting: Internal Medicine

## 2023-07-08 NOTE — Telephone Encounter (Signed)
 Requested too soon last ordered 09/17/22, #90, 3 refills. Requested Prescriptions  Pending Prescriptions Disp Refills   ezetimibe (ZETIA) 10 MG tablet [Pharmacy Med Name: EZETIMIBE 10 MG TABLET] 90 tablet 3    Sig: TAKE 1 TABLET BY MOUTH EVERY DAY     Cardiovascular:  Antilipid - Sterol Transport Inhibitors Failed - 07/08/2023 12:58 PM      Failed - Lipid Panel in normal range within the last 12 months    Cholesterol  Date Value Ref Range Status  04/23/2023 160 <200 mg/dL Final   LDL Cholesterol (Calc)  Date Value Ref Range Status  04/23/2023 69 mg/dL (calc) Final    Comment:    Reference range: <100 . Desirable range <100 mg/dL for primary prevention;   <70 mg/dL for patients with CHD or diabetic patients  with > or = 2 CHD risk factors. Marland Kitchen LDL-C is now calculated using the Martin-Hopkins  calculation, which is a validated novel method providing  better accuracy than the Friedewald equation in the  estimation of LDL-C.  Horald Pollen et al. Lenox Ahr. 1610;960(45): 2061-2068  (http://education.[facility].com/faq/FAQ164)    Direct LDL  Date Value Ref Range Status  12/17/2019 63.0 mg/dL Final    Comment:    Optimal:  <100 mg/dLNear or Above Optimal:  100-129 mg/dLBorderline High:  130-159 mg/dLHigh:  160-189 mg/dLVery High:  >190 mg/dL   HDL  Date Value Ref Range Status  04/23/2023 63 > OR = 50 mg/dL Final   Triglycerides  Date Value Ref Range Status  04/23/2023 221 (H) <150 mg/dL Final    Comment:    . If a non-fasting specimen was collected, consider repeat triglyceride testing on a fasting specimen if clinically indicated.  Perry Mount et al. J. of Clin. Lipidol. 2015;9:129-169. Marland Kitchen          Passed - AST in normal range and within 360 days    AST  Date Value Ref Range Status  04/23/2023 20 10 - 35 U/L Final         Passed - ALT in normal range and within 360 days    ALT  Date Value Ref Range Status  04/23/2023 26 6 - 29 U/L Final         Passed - Patient is  not pregnant      Passed - Valid encounter within last 12 months    Recent Outpatient Visits           2 months ago Encounter for general adult medical examination with abnormal findings   [facility], Salvadore [place], NP   5 months ago Erythema of finger   [place] [facility] [place], [place], NP   5 months ago Type 2 diabetes mellitus with hyperglycemia, with long-term current use of insulin [facility])   [facility] [place], [place] W, NP   8 months ago Type 2 diabetes mellitus with hyperglycemia, with long-term current use of insulin [facility])   [place] [facility] [place], Salvadore Oxford, NP   9 months ago Acute non-recurrent ethmoidal sinusitis   [facility] [place], Salvadore Oxford, NP       Future Appointments             In 3 months Baity, Salvadore Oxford, NP [place] [facility], [facility]

## 2023-07-15 DIAGNOSIS — E1165 Type 2 diabetes mellitus with hyperglycemia: Secondary | ICD-10-CM | POA: Diagnosis not present

## 2023-07-15 DIAGNOSIS — E781 Pure hyperglyceridemia: Secondary | ICD-10-CM | POA: Diagnosis not present

## 2023-07-15 DIAGNOSIS — E785 Hyperlipidemia, unspecified: Secondary | ICD-10-CM | POA: Diagnosis not present

## 2023-07-15 DIAGNOSIS — Z794 Long term (current) use of insulin: Secondary | ICD-10-CM | POA: Diagnosis not present

## 2023-07-15 DIAGNOSIS — E1169 Type 2 diabetes mellitus with other specified complication: Secondary | ICD-10-CM | POA: Diagnosis not present

## 2023-08-09 ENCOUNTER — Telehealth: Payer: Self-pay

## 2023-08-09 NOTE — Telephone Encounter (Signed)
 [name] (Key: [id number]) Rx #: [id number] Ozempic (1 MG/DOSE) 4MG [name] pen-injectors Form [facility] [employer] Electronic Prior Authorization Form 2017 NCPDP Created 29 minutes ago Sent to Plan 6 minutes ago Plan Response 6 minutes ago Submit Clinical Questions less than a minute ago Determination Wait for Determination Please wait for [facility] [employer] 2017 to return a determination.

## 2023-08-09 NOTE — Telephone Encounter (Signed)
 [name] (Key: [id number]) Rx #: [id number] Ozempic (1 MG/DOSE) 4MG [name] pen-injectors Form [facility] [employer] Electronic Prior Authorization Form 2017 NCPDP Created 4 hours ago Sent to Plan 3 hours ago Plan Response 3 hours ago Submit Clinical Questions 3 hours ago Determination Favorable 29 minutes ago Message from Plan 04-APR-25:04-APR-26 Ozempic (1 MG/DOSE) 4MG /3ML [place] SOPN Quantity:3;

## 2023-08-10 ENCOUNTER — Other Ambulatory Visit: Payer: Self-pay | Admitting: Internal Medicine

## 2023-08-11 ENCOUNTER — Other Ambulatory Visit: Payer: Self-pay | Admitting: Internal Medicine

## 2023-08-12 NOTE — Telephone Encounter (Signed)
 Requested Prescriptions  Pending Prescriptions Disp Refills   ezetimibe (ZETIA) 10 MG tablet [Pharmacy Med Name: EZETIMIBE 10 MG TABLET] 90 tablet 1    Sig: TAKE 1 TABLET BY MOUTH EVERY DAY     Cardiovascular:  Antilipid - Sterol Transport Inhibitors Failed - 08/12/2023  4:23 PM      Failed - Valid encounter within last 12 months    Recent Outpatient Visits   None     Future Appointments             In 2 months Baity, Salvadore Oxford, NP [facility], [facility]            Failed - Lipid Panel in normal range within the last 12 months    Cholesterol  Date Value Ref Range Status  04/23/2023 160 <200 mg/dL Final   LDL Cholesterol (Calc)  Date Value Ref Range Status  04/23/2023 69 mg/dL (calc) Final    Comment:    Reference range: <100 . Desirable range <100 mg/dL for primary prevention;   <70 mg/dL for patients with CHD or diabetic patients  with > or = 2 CHD risk factors. Marland Kitchen LDL-C is now calculated using the Martin-Hopkins  calculation, which is a validated novel method providing  better accuracy than the Friedewald equation in the  estimation of LDL-C.  Horald Pollen et al. Lenox Ahr. 1610;960(45): 2061-2068  (http://education.[facility].com/faq/FAQ164)    Direct LDL  Date Value Ref Range Status  12/17/2019 63.0 mg/dL Final    Comment:    Optimal:  <100 mg/dLNear or Above Optimal:  100-129 mg/dLBorderline High:  130-159 mg/dLHigh:  160-189 mg/dLVery High:  >190 mg/dL   HDL  Date Value Ref Range Status  04/23/2023 63 > OR = 50 mg/dL Final   Triglycerides  Date Value Ref Range Status  04/23/2023 221 (H) <150 mg/dL Final    Comment:    . If a non-fasting specimen was collected, consider repeat triglyceride testing on a fasting specimen if clinically indicated.  Perry Mount et al. J. of Clin. Lipidol. 2015;9:129-169. Marland Kitchen          Passed - AST in normal range and within 360 days    AST  Date Value Ref Range Status  04/23/2023 20 10 - 35 U/L Final          Passed - ALT in normal range and within 360 days    ALT  Date Value Ref Range Status  04/23/2023 26 6 - 29 U/L Final         Passed - Patient is not pregnant      Refused Prescriptions Disp Refills   traZODone (DESYREL) 50 MG tablet [Pharmacy Med Name: TRAZODONE 50 MG TABLET] 90 tablet 1    Sig: TAKE 1/2 TO 1 TABLET BY MOUTH AT BEDTIME AS NEEDED FOR SLEEP     Psychiatry: Antidepressants - Serotonin Modulator Failed - 08/12/2023  4:23 PM      Failed - Valid encounter within last 6 months    Recent Outpatient Visits   None     Future Appointments             In 2 months Baity, Salvadore Oxford, NP [place] [facility], [facility]

## 2023-08-13 NOTE — Telephone Encounter (Signed)
 Requested Prescriptions  Pending Prescriptions Disp Refills   insulin glargine (LANTUS SOLOSTAR) 100 UNIT/ML Solostar Pen [Pharmacy Med Name: LANTUS SOLOSTAR 100 UNIT/ML] 15 mL 1    Sig: INJECT 44 UNITS INTO THE SKIN DAILY     Endocrinology:  Diabetes - Insulins Failed - 08/13/2023  8:20 AM      Failed - HBA1C is between 0 and 7.9 and within 180 days    HbA1c POC (<> result, manual entry)  Date Value Ref Range Status  10/12/2022 8.0 4.0 - 5.6 % Final   Hgb A1c MFr Bld  Date Value Ref Range Status  04/23/2023 8.0 (H) <5.7 % of total Hgb Final    Comment:    For someone without known diabetes, a hemoglobin A1c value of 6.5% or greater indicates that they may have  diabetes and this should be confirmed with a follow-up  test. . For someone with known diabetes, a value <7% indicates  that their diabetes is well controlled and a value  greater than or equal to 7% indicates suboptimal  control. A1c targets should be individualized based on  duration of diabetes, age, comorbid conditions, and  other considerations. . Currently, no consensus exists regarding use of hemoglobin A1c for diagnosis of diabetes for children. .          Failed - Valid encounter within last 6 months    Recent Outpatient Visits   None     Future Appointments             In 2 months Baity, Salvadore Oxford, NP [facility], [facility]

## 2023-08-19 DIAGNOSIS — E785 Hyperlipidemia, unspecified: Secondary | ICD-10-CM | POA: Diagnosis not present

## 2023-08-19 DIAGNOSIS — E1165 Type 2 diabetes mellitus with hyperglycemia: Secondary | ICD-10-CM | POA: Diagnosis not present

## 2023-08-19 DIAGNOSIS — E1169 Type 2 diabetes mellitus with other specified complication: Secondary | ICD-10-CM | POA: Diagnosis not present

## 2023-08-19 DIAGNOSIS — E781 Pure hyperglyceridemia: Secondary | ICD-10-CM | POA: Diagnosis not present

## 2023-08-19 DIAGNOSIS — Z794 Long term (current) use of insulin: Secondary | ICD-10-CM | POA: Diagnosis not present

## 2023-09-03 ENCOUNTER — Encounter: Payer: Self-pay | Admitting: Internal Medicine

## 2023-09-09 ENCOUNTER — Other Ambulatory Visit: Payer: Self-pay | Admitting: Internal Medicine

## 2023-09-11 NOTE — Telephone Encounter (Signed)
 Requested Prescriptions  Pending Prescriptions Disp Refills   pantoprazole  (PROTONIX ) 40 MG tablet [Pharmacy Med Name: PANTOPRAZOLE  SOD DR 40 MG TAB] 180 tablet 0    Sig: TAKE 1 TABLET BY MOUTH TWICE A DAY     Gastroenterology: Proton Pump Inhibitors Failed - 09/11/2023 11:21 AM      Failed - Valid encounter within last 12 months    Recent Outpatient Visits   None     Future Appointments             In 1 month Baity, Rankin Buzzard, NP [place] [facility], [facility]

## 2023-09-13 ENCOUNTER — Ambulatory Visit: Payer: Self-pay

## 2023-09-13 NOTE — Telephone Encounter (Signed)
 Copied from CRM [id number]. Topic: Clinical - Red Word Triage >> Sep 13, 2023  3:19 PM [name] wrote: Red Word that prompted transfer to Nurse Triage: Pt has spot of concern that is growing in her stomach - looks like a hernia - no pain but it is getting bigger - she is diabetic and takes injections in stomach but not where the place is.    Chief Complaint: Possible hernis at belly button, no pain Symptoms: Above Frequency: 2 weeks Pertinent Negatives: Patient denies any other symptoms. Asks that PCP read her last My Chart message. Disposition: [] ED /[] Urgent Care (no appt availability in office) / [x] Appointment(In office/virtual)/ []  [place] [facility]/ [] Home Care/ [] Refused Recommended Disposition /[] [place] [facility]/ []  Follow-up with PCP Additional Notes: Agrees with appointment.  Reason for Disposition  [1] New-onset hernia suspected (reducible bulge in groin or abdomen; non-tender) AND [2] NO pain or vomiting  Answer Assessment - Initial Assessment Questions 1. ONSET:  "When did this first appear?"     2 weeks 2. APPEARANCE: "What does it look like?"     Circular 3. SIZE: "How big is it?" (inches, cm or compare to coins, fruit)     2 quarters 4. LOCATION: "Where exactly is the hernia located?"     Near belly button 5. PATTERN: "Does the swelling come and go, or has it been constant since it started?"     Constant 6. PAIN: "Is there any pain?" If Yes, ask: "How bad is it?"  (Scale 1-10; or mild, moderate, severe)    - MILD (1-3): Doesn't interfere with normal activities, abdomen soft and not tender to touch.     - MODERATE (4-7): Interferes with normal activities or awakens from sleep, abdomen tender to touch.     - SEVERE (8-10): Excruciating pain, doubled over, unable to do any normal activities.       No 7. DIAGNOSIS: "Have you been seen by a doctor (or NP/PA) for this?" "Did the doctor diagnose you as having a hernia?"     No 8. OTHER SYMPTOMS: "Do you have any  other symptoms?" (e.g., fever, abdomen pain, vomiting)     No 9. PREGNANCY: "Is there any chance you are pregnant?" "When was your last menstrual period?"     No  Protocols used: Hernia-A-AH

## 2023-09-16 ENCOUNTER — Encounter: Payer: Self-pay | Admitting: Family Medicine

## 2023-09-16 ENCOUNTER — Ambulatory Visit (INDEPENDENT_AMBULATORY_CARE_PROVIDER_SITE_OTHER): Admitting: Family Medicine

## 2023-09-16 VITALS — BP 124/70 | HR 95 | Ht 64.75 in | Wt 180.4 lb

## 2023-09-16 DIAGNOSIS — R222 Localized swelling, mass and lump, trunk: Secondary | ICD-10-CM | POA: Diagnosis not present

## 2023-09-16 NOTE — Progress Notes (Signed)
 Subjective:    Patient ID: [name], female    DOB: 1955-10-13, 68 y.o.   MRN: [id number]  RHYLAN BUOL is a 68 y.o. female presenting on 09/16/2023 for abdominal nodule  Patient presents for a same day appointment.  PCP Helayne Lo, FNP   HPI  Discussed the use of AI scribe software for clinical note transcription with the patient, who gave verbal consent to proceed.  History of Present Illness   [name] is a 68 year old female who presents with a palpable abdominal knot.  She noticed a knot on her abdomen, confirmed by her daughters on Friday. The knot is not painful and was first noticed while in the shower, confirmed by palpation. No history of abdominal surgeries except for a tubal ligation performed lower in the abdomen. No abdominal pain, fever, chills, night sweats, nausea, vomiting, or blood in the stool.   She has been using a rowing machine daily for six months and denies any recent strain or injury. She takes an insulin  shot every night, usually on the left side at the waistline, and has started Mounjaro , with the fifth shot taken last night. Since starting Mounjaro , she has experienced bloating and weight gain, which she finds concerning.  She has no symptoms from this.      Past Surgical History:  Procedure Laterality Date   BLADDER SUSPENSION  12/08/2010   Procedure: TRANSVAGINAL TAPE (TVT) PROCEDURE;  Surgeon: Lawton Price;  Location: [facility];  Service: Gynecology;  Laterality: N/A;  transvaginal tape with cystoscopy   BLADDER SUSPENSION  01/30/2011   Procedure: TRANSVAGINAL TAPE (TVT) PROCEDURE;  Surgeon: Lawton Price;  Location: [facility];  Service: Gynecology;  Laterality: N/A;  excision of excess sling from suprapubic incision and cystoscopy   CERVICAL DISC SURGERY  05/07/2002   COLONOSCOPY WITH PROPOFOL  N/A 05/20/2023   Procedure: COLONOSCOPY WITH PROPOFOL ;  Surgeon: Marnee Sink, MD;  [facility];  Service: Endoscopy;  Laterality: N/A;   Diabetic   CYSTOSCOPY  01/30/2011   Procedure: CYSTOSCOPY;  Surgeon: Lawton Price;  Location: [facility];  Service: Gynecology;  Laterality: N/A;   NECK SURGERY     2005   TONSILLECTOMY  05/07/1974   transvaginal tape  12/06/2010   uterine ablation           09/16/2023    1:20 PM 04/23/2023    8:52 AM 04/12/2023    9:51 AM  Depression screen PHQ 2/9  Decreased Interest 0 1 0  Down, Depressed, Hopeless 0 1 0  PHQ - 2 Score 0 2 0  Altered sleeping 3 3 0  Tired, decreased energy 2 3 0  Change in appetite 3 2 0  Feeling bad or failure about yourself  0 0 0  Trouble concentrating 0 0 0  Moving slowly or fidgety/restless 0 0 0  Suicidal thoughts 0 0 0  PHQ-9 Score 8 10 0  Difficult doing work/chores Not difficult at all Not difficult at all Not difficult at all       09/16/2023    1:20 PM 04/23/2023    8:53 AM 01/16/2023    8:41 AM 10/12/2022    9:22 AM  GAD 7 : Generalized Anxiety Score  Nervous, Anxious, on Edge 3 1 2 3   Control/stop worrying 2 1 2 1   Worry too much - different things 2 1 2 1   Trouble relaxing 2 1 2 2   Restless 1 1 0 0  Easily annoyed  or irritable 1 1 0 1  Afraid - awful might happen 2 1 0 1  Total GAD 7 Score 13 7 8 9   Anxiety Difficulty Somewhat difficult Not difficult at all Not difficult at all Somewhat difficult    Social History   Tobacco Use   Smoking status: Former    Current packs/day: 0.00    Average packs/day: 0.8 packs/day for 30.8 years (23.1 ttl pk-yrs)    Types: Cigarettes    Start date: 89    Quit date: 03/01/2015    Years since quitting: 8.5   Smokeless tobacco: Never   Tobacco comments:    recently quit 03/01/2015  Vaping Use   Vaping status: Never Used  Substance Use Topics   Alcohol use: No    Alcohol/week: 0.0 standard drinks of alcohol   Drug use: No    Review of Systems Per HPI unless specifically indicated above     Objective:     BP 124/70 (BP Location: Right Arm, Patient Position: Sitting, Cuff Size: Normal)    Pulse 95   Ht 5' 4.75" (1.645 m)   Wt 180 lb 6 oz (81.8 kg)   SpO2 99%   BMI 30.25 kg/m   Wt Readings from Last 3 Encounters:  09/16/23 180 lb 6 oz (81.8 kg)  05/20/23 (P) 174 lb 11.2 oz (79.2 kg)  04/23/23 179 lb (81.2 kg)    Physical Exam Vitals and nursing note reviewed.  Constitutional:      General: She is not in acute distress.    Appearance: Normal appearance. She is well-developed. She is not diaphoretic.     Comments: Well-appearing, comfortable, cooperative  HENT:     Head: Normocephalic and atraumatic.  Eyes:     General:        Right eye: No discharge.        Left eye: No discharge.     Conjunctiva/sclera: Conjunctivae normal.  Cardiovascular:     Rate and Rhythm: Normal rate.  Pulmonary:     Effort: Pulmonary effort is normal.  Abdominal:     General: Bowel sounds are normal. There is no distension.     Palpations: Abdomen is soft. There is mass (nodular density RLQ vs near pelvic region).     Tenderness: There is no abdominal tenderness. There is no guarding or rebound.     Hernia: No hernia is present.  Skin:    General: Skin is warm and dry.     Findings: No erythema or rash.  Neurological:     Mental Status: She is alert and oriented to person, place, and time.  Psychiatric:        Mood and Affect: Mood normal.        Behavior: Behavior normal.        Thought Content: Thought content normal.     Comments: Well groomed, good eye contact, normal speech and thoughts     Results for orders placed or performed during the hospital encounter of 05/20/23  Glucose, capillary   Collection Time: 05/20/23  7:29 AM  Result Value Ref Range   Glucose-Capillary 219 (H) 70 - 99 mg/dL      Assessment & Plan:   Problem List Items Addressed This Visit   None Visit Diagnoses       Subcutaneous nodule of abdominal Weisel    -  Primary   Relevant Orders   US  Abdomen Limited       Abdominal Nodule, RLQ Asymptomatic, incidental finding on her own  exam. Firm,  non-tender mass in right lower quadrant, likely muscle or connective tissue based on my exam today and localization.  No evidence of hernia on exam Differential includes lipoma, fibrous tissue, scar tissue, lymphadenopathy. No systemic symptoms.   Ultrasound recommended for evaluation. - Order abdominal ultrasound limited of this area. - Schedule ultrasound at [facility] outpatient imaging center.  Bloating Bloating and weight gain likely due to Mounjaro , unrelated to abdominal mass.        Orders Placed This Encounter  Procedures   US  Abdomen Limited    Firm nodular density possibly MSK soft tissue, Right lower abdomen quadrant vs pelvis, non tender. Palpable to patient    Standing Status:   Future    Expiration Date:   09/15/2024    Reason for Exam (SYMPTOM  OR DIAGNOSIS REQUIRED):   Firm nodular density possibly MSK soft tissue, Right lower abdomen quadrant vs pelvis, non tender. Palpable to patient    Preferred imaging location?:   [facility]    No orders of the defined types were placed in this encounter.   Follow up plan: Return if symptoms worsen or fail to improve.   Domingo Friend, DO [facility] 09/16/2023, 2:02 PM

## 2023-09-16 NOTE — Patient Instructions (Addendum)
 Thank you for coming to the office today.  We will order an Ultrasound to investigate the area in question.  I don't believe it is a concerning spot, most likely nodule or possibly scar tissue question this seems to be in the muscle fibrous connective fatty tissue layers, not deeper into abdomen.  [facility] at [facility] Address: [address] [place], [place] [address] Phone: [phone]  Please schedule a Follow-up Appointment to: Return if symptoms worsen or fail to improve.  If you have any other questions or concerns, please feel free to call the office or send a message through MyChart. You may also schedule an earlier appointment if necessary.  Additionally, you may be receiving a survey about your experience at our office within a few days to 1 week by e-mail or mail. We value your feedback.  Domingo Friend, DO [facility], [place]

## 2023-09-19 ENCOUNTER — Ambulatory Visit: Payer: Self-pay | Admitting: Family Medicine

## 2023-09-19 ENCOUNTER — Ambulatory Visit
Admission: RE | Admit: 2023-09-19 | Discharge: 2023-09-19 | Disposition: A | Discharge status: Home / Self Care | Source: Ambulatory Visit | Attending: Family Medicine | Admitting: Family Medicine

## 2023-09-19 DIAGNOSIS — R222 Localized swelling, mass and lump, trunk: Secondary | ICD-10-CM | POA: Diagnosis not present

## 2023-10-23 ENCOUNTER — Ambulatory Visit: Payer: Self-pay | Admitting: Internal Medicine

## 2023-10-23 NOTE — Progress Notes (Deleted)
 Subjective:    Patient ID: [name], female    DOB: Nov 02, 1955, 68 y.o.   MRN: [id number]  HPI  Patient presents to clinic today for 49-month follow-up of chronic conditions.  BPPV: Intermittent, managed with zofran  and meclizine  as needed.  She reports a flare a couple of weeks ago.  She follows with ENT.  OA: Mainly in her hands and wrists.  She is not currently taking any medications for this.  She does not follow with orthopedics.  GERD: Triggered by spicy foods.  She denies breakthrough on pantoprazole .  There is no upper GI on file.  HLD: Her last LDL was 69, triglycerides 621, will/2024.  She denies myalgias on atorvastatin , ezetimibe  and fenofibrate .  She does not consume a low-fat diet.  DM2: Her last A1c was 10.2 %, 08/2023.  She is taking glipizide , metformin , r Ozempic  and lantus  as prescribed.  Her sugars range 64-208.  She checks her feet routinely.  Her last eye exam was 12/2022.  Flu 01/2023.  Pneumovax 04/2022.  COVID never.  She follows with endocrinology.  Insomnia: She has difficulty staying asleep.  She is not currently taking any medications for this but has been on trazodone  in the past.  There is no sleep study on file.  Anxiety: Chronic but she is not currently taking any medications for this.  She is not currently seeing a therapist.  She denies depression, SI/HI.  Osteoporosis: She is taking calcium  and vitamin D  as prescribed.  She tries to get some weightbearing exercise in.  Bone density from 04/2021 reviewed.   Review of Systems     Past Medical History:  Diagnosis Date   Blood transfusion    1984   [facility]   Chronic kidney disease    occasional renal stone   Complication of anesthesia    woke during surgery (ablation) 2006?   Deaf, right    Diabetes mellitus    type 2 Diab- A1C-7   Encounter for general adult medical examination with abnormal findings 02/02/2022   Family history of adverse reaction to anesthesia    Mother - PONV   GERD  (gastroesophageal reflux disease)    occasional Protonix -will take protonix  night before surgery   Hypercholesteremia    on meds   PONV (postoperative nausea and vomiting)    Vertigo    Wears hearing aid in both ears     Current Outpatient Medications  Medication Sig Dispense Refill   aspirin EC 81 MG tablet Take 81 mg by mouth daily. Swallow whole.     atorvastatin  (LIPITOR) 10 MG tablet TAKE 1 TABLET BY MOUTH EVERY DAY 90 tablet 2   B-D UF III MINI PEN NEEDLES 31G X 5 MM MISC Use 4x a day 200 each 3   Biotin 5 MG CAPS Take by mouth.     Blood Glucose Monitoring Suppl (ONETOUCH VERIO) w/Device KIT Use as instructed to check blood sugar 3X daily 1 kit 0   CALCIUM  PO Take 1,200 mg by mouth daily.     Cholecalciferol (VITAMIN D -3) 25 MCG (1000 UT) CAPS Take 1,000 Units by mouth daily.     diazepam (VALIUM) 2 MG tablet Take by mouth.     ezetimibe  (ZETIA ) 10 MG tablet TAKE 1 TABLET BY MOUTH EVERY DAY 90 tablet 1   fenofibrate  (TRICOR ) 145 MG tablet TAKE 1 TABLET BY MOUTH EVERY DAY 90 tablet 1   Ferrous Sulfate (IRON PO) Take by mouth.     glipiZIDE  (GLUCOTROL ) 10  MG tablet Take 0.5 tablets (5 mg total) by mouth 2 (two) times daily before a meal. 180 tablet 1   glucose blood (ONETOUCH VERIO) test strip USE AS DIRECTED TO CHECK BLOOD SUGAR 3 TIMES A DAY 300 strip 0   insulin  glargine (LANTUS  SOLOSTAR) 100 UNIT/ML Solostar Pen INJECT 44 UNITS INTO THE SKIN DAILY 15 mL 1   metFORMIN  (GLUCOPHAGE ) 1000 MG tablet TAKE 1 TABLET (1,000 MG TOTAL) BY MOUTH TWICE A DAY WITH FOOD 180 tablet 1   Multiple Vitamins-Minerals (MULTIVITAMINS THER. W/MINERALS) TABS Take 1 tablet by mouth daily.     mupirocin  ointment (BACTROBAN ) 2 % Apply 1 Application topically 2 (two) times daily. 22 g 0   Omega-3 Fatty Acids (FISH OIL) 1000 MG CAPS Take 1,000 mg by mouth 2 (two) times daily.     ondansetron  (ZOFRAN -ODT) 8 MG disintegrating tablet 8mg  ODT q4 hours prn nausea 30 tablet 0   OZEMPIC , 1 MG/DOSE, 4 MG/3ML SOPN  Inject 1 mg into the skin once a week. (Patient not taking: Reported on 09/16/2023) 9 mL 2   pantoprazole  (PROTONIX ) 40 MG tablet TAKE 1 TABLET BY MOUTH TWICE A DAY 180 tablet 0   scopolamine  (TRANSDERM-SCOP) 1 MG/3DAYS Place 1 patch (1.5 mg total) onto the skin every 3 (three) days. For motion sickness. Start 2 hr before onset symptoms, up to 12 hr before (Patient not taking: Reported on 09/16/2023) 10 patch 0   tirzepatide  (MOUNJARO ) 7.5 MG/0.5ML Pen Inject 7.5 mg into the skin once a week.     Zinc Gluconate-Vitamin C (BL ZINC-VITAMIN C LOZENGE MT) Use as directed 1 tablet in the mouth or throat daily.     No current facility-administered medications for this visit.    Allergies  Allergen Reactions   Jardiance  [Empagliflozin ]     Myalgias   Sulfa Antibiotics     Other reaction(s): Edema    Family History  Problem Relation Age of Onset   Diabetes Mother    Hypertension Mother    Hyperlipidemia Mother    Heart disease Mother    Heart disease Father    Hyperlipidemia Father    Diabetes Father    Heart attack Father    Varicose Veins Father    Peripheral vascular disease Father        amputation   Cancer Paternal Aunt        lung/ brain   Diabetes Maternal Grandmother     Social History   Socioeconomic History   Marital status: Widowed    Spouse name: Not on file   Number of children: Not on file   Years of education: Not on file   Highest education level: 12th grade  Occupational History   Not on file  Tobacco Use   Smoking status: Former    Current packs/day: 0.00    Average packs/day: 0.8 packs/day for 30.8 years (23.1 ttl pk-yrs)    Types: Cigarettes    Start date: 89    Quit date: 03/01/2015    Years since quitting: 8.6   Smokeless tobacco: Never   Tobacco comments:    recently quit 03/01/2015  Vaping Use   Vaping status: Never Used  Substance and Sexual Activity   Alcohol use: No    Alcohol/week: 0.0 standard drinks of alcohol   Drug use: No   Sexual  activity: Yes  Other Topics Concern   Not on file  Social History Narrative   Right handed   Retired   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  Financial Resource Strain: Low Risk  (04/23/2023)   Overall Financial Resource Strain (CARDIA)    Difficulty of Paying Living Expenses: Not hard at all  Food Insecurity: No Food Insecurity (04/23/2023)   Hunger Vital Sign    Worried About Running Out of Food in the Last Year: Never true    Ran Out of Food in the Last Year: Never true  Transportation Needs: No Transportation Needs (04/23/2023)   PRAPARE - Administrator, Civil Service (Medical): No    Lack of Transportation (Non-Medical): No  Physical Activity: Insufficiently Active (04/23/2023)   Exercise Vital Sign    Days of Exercise per Week: 4 days    Minutes of Exercise per Session: 30 min  Stress: Stress Concern Present (04/23/2023)   [employer] of Occupational Health - Occupational Stress Questionnaire    Feeling of Stress : Very much  Social Connections: Moderately Integrated (04/23/2023)   Social Connection and Isolation Panel    Frequency of Communication with Friends and Family: More than three times a week    Frequency of Social Gatherings with Friends and Family: Once a week    Attends Religious Services: More than 4 times per year    Active Member of [employer] or Organizations: Yes    Attends Banker Meetings: More than 4 times per year    Marital Status: Widowed  Recent Concern: Social Connections - Moderately Isolated (04/12/2023)   Social Connection and Isolation Panel    Frequency of Communication with Friends and Family: More than three times a week    Frequency of Social Gatherings with Friends and Family: Twice a week    Attends Religious Services: More than 4 times per year    Active Member of [employer] or Organizations: No    Attends Banker Meetings: Never    Marital Status: Widowed  Intimate Partner Violence: Not At Risk (04/23/2023)    Humiliation, Afraid, Rape, and Kick questionnaire    Fear of Current or Ex-Partner: No    Emotionally Abused: No    Physically Abused: No    Sexually Abused: No     Constitutional: Denies fever, malaise, fatigue, headache or abrupt weight changes.  HEENT: Denies eye pain, eye redness, ear pain, ringing in the ears, wax buildup, runny nose, nasal congestion, bloody nose, or sore throat. Respiratory: Denies difficulty breathing, shortness of breath, cough or sputum production.   Cardiovascular: Denies chest pain, chest tightness, palpitations or swelling in the hands or feet.  Gastrointestinal: Denies abdominal pain, bloating, constipation, diarrhea or blood in the stool.  GU: Denies urgency, frequency, pain with urination, burning sensation, blood in urine, odor or discharge. Musculoskeletal: Patient reports joint pain.  Denies decrease in range of motion, difficulty with gait, muscle pain or joint swelling.  Skin: Denies redness, rashes, lesions or ulcercations.  Neurological: Patient reports insomnia.  Denies dizziness, difficulty with memory, difficulty with speech or problems with balance and coordination.  Psych: Patient has a history of anxiety.  Denies depression, SI/HI.  No other specific complaints in a complete review of systems (except as listed in HPI above).  Objective:   Physical Exam   There were no vitals taken for this visit.  Wt Readings from Last 3 Encounters:  09/16/23 180 lb 6 oz (81.8 kg)  05/20/23 (P) 174 lb 11.2 oz (79.2 kg)  04/23/23 179 lb (81.2 kg)    General: Appears her stated age, overweight, in NAD. Skin: Warm, dry and intact. No  ulcerations noted.  HEENT: Head: normal shape and size; Eyes: sclera white, no icterus, conjunctiva pink, PERRLA and EOMs intact;  Cardiovascular: Normal rate and rhythm. S1,S2 noted.  No murmur, rubs or gallops noted. No JVD or BLE edema. No carotid bruits noted. Pulmonary/Chest: Normal effort and positive vesicular breath  sounds. No respiratory distress. No wheezes, rales or ronchi noted.  Abdomen: Soft and nontender. Normal bowel sounds.  Musculoskeletal:  No difficulty with gait.  Neurological: Alert and oriented. Coordination normal.  Psychiatric: Mood and affect normal. Behavior is normal. Judgment and thought content normal.     BMET    Component Value Date/Time   NA 138 04/23/2023 0900   K 4.4 04/23/2023 0900   CL 100 04/23/2023 0900   CO2 27 04/23/2023 0900   GLUCOSE 124 (H) 04/23/2023 0900   BUN 18 04/23/2023 0900   CREATININE 0.90 04/23/2023 0900   CALCIUM  10.4 04/23/2023 0900   GFRNONAA 76 09/27/2016 0854   GFRAA 87 09/27/2016 0854    Lipid Panel     Component Value Date/Time   CHOL 160 04/23/2023 0900   TRIG 221 (H) 04/23/2023 0900   HDL 63 04/23/2023 0900   CHOLHDL 2.5 04/23/2023 0900   VLDL 60.8 (H) 12/17/2019 0901   LDLCALC 69 04/23/2023 0900    CBC    Component Value Date/Time   WBC 9.0 04/23/2023 0900   RBC 4.53 04/23/2023 0900   HGB 13.1 04/23/2023 0900   HCT 40.1 04/23/2023 0900   PLT 343 04/23/2023 0900   MCV 88.5 04/23/2023 0900   MCH 28.9 04/23/2023 0900   MCHC 32.7 04/23/2023 0900   RDW 12.7 04/23/2023 0900   LYMPHSABS 3,576 12/29/2021 1437   MONOABS 0.6 01/07/2014 1402   EOSABS 0 (L) 12/29/2021 1437   BASOSABS 64 12/29/2021 1437    Hgb A1C Lab Results  Component Value Date   HGBA1C 8.0 (H) 04/23/2023           Assessment & Plan:     RTC in 6 months for your annual exam Helayne Lo, NP

## 2023-11-06 ENCOUNTER — Other Ambulatory Visit: Payer: Self-pay | Admitting: Internal Medicine

## 2023-11-06 DIAGNOSIS — Z794 Long term (current) use of insulin: Secondary | ICD-10-CM

## 2023-11-06 DIAGNOSIS — E785 Hyperlipidemia, unspecified: Secondary | ICD-10-CM

## 2023-11-06 DIAGNOSIS — E1165 Type 2 diabetes mellitus with hyperglycemia: Secondary | ICD-10-CM

## 2023-11-08 NOTE — Telephone Encounter (Signed)
 Requested medications are due for refill today.  yes  Requested medications are on the active medications list.  yes  Last refill. 03/21/2023 #180 1 rf  Future visit scheduled.   yes  Notes to clinic.  Labs are expired.    Requested Prescriptions  Pending Prescriptions Disp Refills   metFORMIN  (GLUCOPHAGE ) 1000 MG tablet [Pharmacy Med Name: METFORMIN  HCL 1,000 MG TABLET] 180 tablet 1    Sig: TAKE 1 TABLET (1,000 MG TOTAL) BY MOUTH TWICE A DAY WITH FOOD     Endocrinology:  Diabetes - Biguanides Failed - 11/08/2023  4:34 PM      Failed - HBA1C is between 0 and 7.9 and within 180 days    HbA1c POC (<> result, manual entry)  Date Value Ref Range Status  10/12/2022 8.0 4.0 - 5.6 % Final   Hgb A1c MFr Bld  Date Value Ref Range Status  04/23/2023 8.0 (H) <5.7 % of total Hgb Final    Comment:    For someone without known diabetes, a hemoglobin A1c value of 6.5% or greater indicates that they may have  diabetes and this should be confirmed with a follow-up  test. . For someone with known diabetes, a value <7% indicates  that their diabetes is well controlled and a value  greater than or equal to 7% indicates suboptimal  control. A1c targets should be individualized based on  duration of diabetes, age, comorbid conditions, and  other considerations. . Currently, no consensus exists regarding use of hemoglobin A1c for diagnosis of diabetes for children. .          Failed - Valid encounter within last 6 months    Recent Outpatient Visits           1 month ago Subcutaneous nodule of abdominal [place]   [place] [facility] [place], [name], DO              Failed - CBC within normal limits and completed in the last 12 months    WBC  Date Value Ref Range Status  04/23/2023 9.0 3.8 - 10.8 Thousand/uL Final   RBC  Date Value Ref Range Status  04/23/2023 4.53 3.80 - 5.10 Million/uL Final   Hemoglobin  Date Value Ref Range Status  04/23/2023  13.1 11.7 - 15.5 g/dL Final   HCT  Date Value Ref Range Status  04/23/2023 40.1 35.0 - 45.0 % Final   MCHC  Date Value Ref Range Status  04/23/2023 32.7 32.0 - 36.0 g/dL Final    Comment:    For adults, a slight decrease in the calculated MCHC value (in the range of 30 to 32 g/dL) is most likely not clinically significant; however, it should be interpreted with caution in correlation with other red cell parameters and the patient's clinical condition.    [facility]  Date Value Ref Range Status  04/23/2023 28.9 27.0 - 33.0 pg Final   MCV  Date Value Ref Range Status  04/23/2023 88.5 80.0 - 100.0 fL Final   No results found for: PLTCOUNTKUC, LABPLAT, POCPLA RDW  Date Value Ref Range Status  04/23/2023 12.7 11.0 - 15.0 % Final         Passed - Cr in normal range and within 360 days    Creat  Date Value Ref Range Status  04/23/2023 0.90 0.50 - 1.05 mg/dL Final   Creatinine, Urine  Date Value Ref Range Status  04/23/2023 112 20 - 275 mg/dL Final  Passed - eGFR in normal range and within 360 days    GFR, Est African American  Date Value Ref Range Status  09/27/2016 87 >=60 mL/min Final   GFR, Est Non African American  Date Value Ref Range Status  09/27/2016 76 >=60 mL/min Final   GFR  Date Value Ref Range Status  12/17/2019 65.14 >60.00 mL/min Final   eGFR  Date Value Ref Range Status  04/23/2023 71 > OR = 60 mL/min/1.39m2 Final         Passed - B12 Level in normal range and within 720 days    Vitamin B-12  Date Value Ref Range Status  12/29/2021 565 200 - 1,100 pg/mL Final         Signed Prescriptions Disp Refills   BD PEN NEEDLE MINI ULTRAFINE 31G X 5 MM MISC 200 each 3    Sig: USE 4 TIMES A DAY     Endocrinology: Diabetes - Testing Supplies Failed - 11/08/2023  4:34 PM      Failed - Valid encounter within last 12 months    Recent Outpatient Visits           1 month ago Subcutaneous nodule of abdominal [place]   [place] [facility] [place], [name], DO              Refused Prescriptions Disp Refills   fenofibrate  (TRICOR ) 145 MG tablet [Pharmacy Med Name: FENOFIBRATE  145 MG TABLET] 90 tablet 1    Sig: TAKE 1 TABLET BY MOUTH EVERY DAY     Cardiovascular:  Antilipid - Fibric Acid Derivatives Failed - 11/08/2023  4:34 PM      Failed - Valid encounter within last 12 months    Recent Outpatient Visits           1 month ago Subcutaneous nodule of abdominal [place]   [place] [facility] [place] [name], DO              Failed - Lipid Panel in normal range within the last 12 months    Cholesterol  Date Value Ref Range Status  04/23/2023 160 <200 mg/dL Final   LDL Cholesterol (Calc)  Date Value Ref Range Status  04/23/2023 69 mg/dL (calc) Final    Comment:    Reference range: <100 . Desirable range <100 mg/dL for primary prevention;   <70 mg/dL for patients with CHD or diabetic patients  with > or = 2 CHD risk factors. SABRA LDL-C is now calculated using the Martin-Hopkins  calculation, which is a validated novel method providing  better accuracy than the Friedewald equation in the  estimation of LDL-C.  [name] 7986;689(80): 2061-2068  (http://education.[facility].com/faq/FAQ164)    Direct LDL  Date Value Ref Range Status  12/17/2019 63.0 mg/dL Final    Comment:    Optimal:  <100 mg/dLNear or Above Optimal:  100-129 mg/dLBorderline High:  130-159 mg/dLHigh:  160-189 mg/dLVery High:  >190 mg/dL   HDL  Date Value Ref Range Status  04/23/2023 63 > OR = 50 mg/dL Final   Triglycerides  Date Value Ref Range Status  04/23/2023 221 (H) <150 mg/dL Final    Comment:    . If a non-fasting specimen was collected, consider repeat triglyceride testing on a fasting specimen if clinically indicated.  [name];[name]          Passed - ALT in normal range and within 360 days  ALT  Date Value  Ref Range Status  04/23/2023 26 6 - 29 U/L Final         Passed - AST in normal range and within 360 days    AST  Date Value Ref Range Status  04/23/2023 20 10 - 35 U/L Final         Passed - Cr in normal range and within 360 days    Creat  Date Value Ref Range Status  04/23/2023 0.90 0.50 - 1.05 mg/dL Final   Creatinine, Urine  Date Value Ref Range Status  04/23/2023 112 20 - 275 mg/dL Final         Passed - HGB in normal range and within 360 days    Hemoglobin  Date Value Ref Range Status  04/23/2023 13.1 11.7 - 15.5 g/dL Final         Passed - HCT in normal range and within 360 days    HCT  Date Value Ref Range Status  04/23/2023 40.1 35.0 - 45.0 % Final         Passed - PLT in normal range and within 360 days    Platelets  Date Value Ref Range Status  04/23/2023 343 140 - 400 Thousand/uL Final         Passed - WBC in normal range and within 360 days    WBC  Date Value Ref Range Status  04/23/2023 9.0 3.8 - 10.8 Thousand/uL Final         Passed - eGFR is 30 or above and within 360 days    GFR, Est African American  Date Value Ref Range Status  09/27/2016 87 >=60 mL/min Final   GFR, Est Non African American  Date Value Ref Range Status  09/27/2016 76 >=60 mL/min Final   GFR  Date Value Ref Range Status  12/17/2019 65.14 >60.00 mL/min Final   eGFR  Date Value Ref Range Status  04/23/2023 71 > OR = 60 mL/min/1.20m2 Final          atorvastatin  (LIPITOR) 10 MG tablet [Pharmacy Med Name: ATORVASTATIN  10 MG TABLET] 90 tablet 2    Sig: TAKE 1 TABLET BY MOUTH EVERY DAY     Cardiovascular:  Antilipid - Statins Failed - 11/08/2023  4:34 PM      Failed - Valid encounter within last 12 months    Recent Outpatient Visits           1 month ago Subcutaneous nodule of abdominal [place]   [facility] [place] [name], DO              Failed - Lipid Panel in normal range within the last 12 months    Cholesterol  Date  Value Ref Range Status  04/23/2023 160 <200 mg/dL Final   LDL Cholesterol (Calc)  Date Value Ref Range Status  04/23/2023 69 mg/dL (calc) Final    Comment:    Reference range: <100 . Desirable range <100 mg/dL for primary prevention;   <70 mg/dL for patients with CHD or diabetic patients  with > or = 2 CHD risk factors. SABRA LDL-C is now calculated using the Martin-Hopkins  calculation, which is a validated novel method providing  better accuracy than the Friedewald equation in the  estimation of LDL-C.  [name] 7986;689(80): 2061-2068  (http://education.[facility].com/faq/FAQ164)    Direct LDL  Date Value Ref Range Status  12/17/2019 63.0 mg/dL Final    Comment:    Optimal:  <  100 mg/dLNear or Above Optimal:  100-129 mg/dLBorderline High:  130-159 mg/dLHigh:  160-189 mg/dLVery High:  >190 mg/dL   HDL  Date Value Ref Range Status  04/23/2023 63 > OR = 50 mg/dL Final   Triglycerides  Date Value Ref Range Status  04/23/2023 221 (H) <150 mg/dL Final    Comment:    . If a non-fasting specimen was collected, consider repeat triglyceride testing on a fasting specimen if clinically indicated.  [name];[name]          Passed - Patient is not pregnant       sg

## 2023-11-08 NOTE — Telephone Encounter (Signed)
 Requested Prescriptions  Pending Prescriptions Disp Refills   metFORMIN  (GLUCOPHAGE ) 1000 MG tablet [Pharmacy Med Name: METFORMIN  HCL 1,000 MG TABLET] 180 tablet 1    Sig: TAKE 1 TABLET (1,000 MG TOTAL) BY MOUTH TWICE A DAY WITH FOOD     Endocrinology:  Diabetes - Biguanides Failed - 11/08/2023  4:33 PM      Failed - HBA1C is between 0 and 7.9 and within 180 days    HbA1c POC (<> result, manual entry)  Date Value Ref Range Status  10/12/2022 8.0 4.0 - 5.6 % Final   Hgb A1c MFr Bld  Date Value Ref Range Status  04/23/2023 8.0 (H) <5.7 % of total Hgb Final    Comment:    For someone without known diabetes, a hemoglobin A1c value of 6.5% or greater indicates that they may have  diabetes and this should be confirmed with a follow-up  test. . For someone with known diabetes, a value <7% indicates  that their diabetes is well controlled and a value  greater than or equal to 7% indicates suboptimal  control. A1c targets should be individualized based on  duration of diabetes, age, comorbid conditions, and  other considerations. . Currently, no consensus exists regarding use of hemoglobin A1c for diagnosis of diabetes for children. .          Failed - Valid encounter within last 6 months    Recent Outpatient Visits           1 month ago Subcutaneous nodule of abdominal [name]   [name], [name] PARAS, DO              Failed - CBC within normal limits and completed in the last 12 months    WBC  Date Value Ref Range Status  04/23/2023 9.0 3.8 - 10.8 Thousand/uL Final   RBC  Date Value Ref Range Status  04/23/2023 4.53 3.80 - 5.10 Million/uL Final   Hemoglobin  Date Value Ref Range Status  04/23/2023 13.1 11.7 - 15.5 g/dL Final   HCT  Date Value Ref Range Status  04/23/2023 40.1 35.0 - 45.0 % Final   MCHC  Date Value Ref Range Status  04/23/2023 32.7 32.0 - 36.0 g/dL Final    Comment:    For adults, a slight decrease in the  calculated MCHC value (in the range of 30 to 32 g/dL) is most likely not clinically significant; however, it should be interpreted with caution in correlation with other red cell parameters and the patient's clinical condition.    [facility]  Date Value Ref Range Status  04/23/2023 28.9 27.0 - 33.0 pg Final   MCV  Date Value Ref Range Status  04/23/2023 88.5 80.0 - 100.0 fL Final   No results found for: PLTCOUNTKUC, LABPLAT, POCPLA RDW  Date Value Ref Range Status  04/23/2023 12.7 11.0 - 15.0 % Final         Passed - Cr in normal range and within 360 days    Creat  Date Value Ref Range Status  04/23/2023 0.90 0.50 - 1.05 mg/dL Final   Creatinine, Urine  Date Value Ref Range Status  04/23/2023 112 20 - 275 mg/dL Final         Passed - eGFR in normal range and within 360 days    GFR, Est African American  Date Value Ref Range Status  09/27/2016 87 >=60 mL/min Final   GFR, Est Non African American  Date Value Ref Range  Status  09/27/2016 76 >=60 mL/min Final   GFR  Date Value Ref Range Status  12/17/2019 65.14 >60.00 mL/min Final   eGFR  Date Value Ref Range Status  04/23/2023 71 > OR = 60 mL/min/1.40m2 Final         Passed - B12 Level in normal range and within 720 days    Vitamin B-12  Date Value Ref Range Status  12/29/2021 565 200 - 1,100 pg/mL Final          fenofibrate  (TRICOR ) 145 MG tablet [Pharmacy Med Name: FENOFIBRATE  145 MG TABLET] 90 tablet 1    Sig: TAKE 1 TABLET BY MOUTH EVERY DAY     Cardiovascular:  Antilipid - Fibric Acid Derivatives Failed - 11/08/2023  4:33 PM      Failed - Valid encounter within last 12 months    Recent Outpatient Visits           1 month ago Subcutaneous nodule of abdominal [name]   [place] [facility] [place] [name] PARAS, DO              Failed - Lipid Panel in normal range within the last 12 months    Cholesterol  Date Value Ref Range Status  04/23/2023 160 <200 mg/dL Final   LDL  Cholesterol (Calc)  Date Value Ref Range Status  04/23/2023 69 mg/dL (calc) Final    Comment:    Reference range: <100 . Desirable range <100 mg/dL for primary prevention;   <70 mg/dL for patients with CHD or diabetic patients  with > or = 2 CHD risk factors. SABRA LDL-C is now calculated using the Martin-Hopkins  calculation, which is a validated novel method providing  better accuracy than the Friedewald equation in the  estimation of LDL-C.  [name] 7986;689(80): 2061-2068  (http://education.[facility].com/faq/FAQ164)    Direct LDL  Date Value Ref Range Status  12/17/2019 63.0 mg/dL Final    Comment:    Optimal:  <100 mg/dLNear or Above Optimal:  100-129 mg/dLBorderline High:  130-159 mg/dLHigh:  160-189 mg/dLVery High:  >190 mg/dL   HDL  Date Value Ref Range Status  04/23/2023 63 > OR = 50 mg/dL Final   Triglycerides  Date Value Ref Range Status  04/23/2023 221 (H) <150 mg/dL Final    Comment:    . If a non-fasting specimen was collected, consider repeat triglyceride testing on a fasting specimen if clinically indicated.  [name];[name]          Passed - ALT in normal range and within 360 days    ALT  Date Value Ref Range Status  04/23/2023 26 6 - 29 U/L Final         Passed - AST in normal range and within 360 days    AST  Date Value Ref Range Status  04/23/2023 20 10 - 35 U/L Final         Passed - Cr in normal range and within 360 days    Creat  Date Value Ref Range Status  04/23/2023 0.90 0.50 - 1.05 mg/dL Final   Creatinine, Urine  Date Value Ref Range Status  04/23/2023 112 20 - 275 mg/dL Final         Passed - HGB in normal range and within 360 days    Hemoglobin  Date Value Ref Range Status  04/23/2023 13.1 11.7 - 15.5 g/dL Final  Passed - HCT in normal range and within 360 days    HCT  Date Value Ref Range Status  04/23/2023 40.1 35.0 - 45.0 % Final         Passed -  PLT in normal range and within 360 days    Platelets  Date Value Ref Range Status  04/23/2023 343 140 - 400 Thousand/uL Final         Passed - WBC in normal range and within 360 days    WBC  Date Value Ref Range Status  04/23/2023 9.0 3.8 - 10.8 Thousand/uL Final         Passed - eGFR is 30 or above and within 360 days    GFR, Est African American  Date Value Ref Range Status  09/27/2016 87 >=60 mL/min Final   GFR, Est Non African American  Date Value Ref Range Status  09/27/2016 76 >=60 mL/min Final   GFR  Date Value Ref Range Status  12/17/2019 65.14 >60.00 mL/min Final   eGFR  Date Value Ref Range Status  04/23/2023 71 > OR = 60 mL/min/1.66m2 Final          atorvastatin  (LIPITOR) 10 MG tablet [Pharmacy Med Name: ATORVASTATIN  10 MG TABLET] 90 tablet 2    Sig: TAKE 1 TABLET BY MOUTH EVERY DAY     Cardiovascular:  Antilipid - Statins Failed - 11/08/2023  4:33 PM      Failed - Valid encounter within last 12 months    Recent Outpatient Visits           1 month ago Subcutaneous nodule of abdominal [place]   [place] [facility] [place] [name] PARAS, DO              Failed - Lipid Panel in normal range within the last 12 months    Cholesterol  Date Value Ref Range Status  04/23/2023 160 <200 mg/dL Final   LDL Cholesterol (Calc)  Date Value Ref Range Status  04/23/2023 69 mg/dL (calc) Final    Comment:    Reference range: <100 . Desirable range <100 mg/dL for primary prevention;   <70 mg/dL for patients with CHD or diabetic patients  with > or = 2 CHD risk factors. SABRA LDL-C is now calculated using the Martin-Hopkins  calculation, which is a validated novel method providing  better accuracy than the Friedewald equation in the  estimation of LDL-C.  [name] 7986;689(80): 2061-2068  (http://education.[facility].com/faq/FAQ164)    Direct LDL  Date Value Ref Range Status  12/17/2019 63.0 mg/dL Final    Comment:     Optimal:  <100 mg/dLNear or Above Optimal:  100-129 mg/dLBorderline High:  130-159 mg/dLHigh:  160-189 mg/dLVery High:  >190 mg/dL   HDL  Date Value Ref Range Status  04/23/2023 63 > OR = 50 mg/dL Final   Triglycerides  Date Value Ref Range Status  04/23/2023 221 (H) <150 mg/dL Final    Comment:    . If a non-fasting specimen was collected, consider repeat triglyceride testing on a fasting specimen if clinically indicated.  [name];[name]          Passed - Patient is not pregnant       BD PEN NEEDLE MINI ULTRAFINE 31G X 5 MM MISC [Pharmacy Med Name: BD UF MINI PEN NEEDLE 5MMX31G] 200 each 3    Sig: USE 4 TIMES A DAY     Endocrinology: Diabetes - Testing  Supplies Failed - 11/08/2023  4:33 PM      Failed - Valid encounter within last 12 months    Recent Outpatient Visits           1 month ago Subcutaneous nodule of abdominal [place]   [facility] [place], [name], [place]

## 2023-11-13 DIAGNOSIS — Z1231 Encounter for screening mammogram for malignant neoplasm of breast: Secondary | ICD-10-CM | POA: Diagnosis not present

## 2023-11-13 LAB — HM MAMMOGRAPHY

## 2023-11-18 DIAGNOSIS — Z794 Long term (current) use of insulin: Secondary | ICD-10-CM | POA: Diagnosis not present

## 2023-11-18 DIAGNOSIS — E1165 Type 2 diabetes mellitus with hyperglycemia: Secondary | ICD-10-CM | POA: Diagnosis not present

## 2023-11-18 DIAGNOSIS — E785 Hyperlipidemia, unspecified: Secondary | ICD-10-CM | POA: Diagnosis not present

## 2023-11-18 DIAGNOSIS — E781 Pure hyperglyceridemia: Secondary | ICD-10-CM | POA: Diagnosis not present

## 2023-11-18 DIAGNOSIS — E1169 Type 2 diabetes mellitus with other specified complication: Secondary | ICD-10-CM | POA: Diagnosis not present

## 2023-12-08 ENCOUNTER — Other Ambulatory Visit: Payer: Self-pay | Admitting: Internal Medicine

## 2023-12-10 NOTE — Telephone Encounter (Signed)
 Requested Prescriptions  Pending Prescriptions Disp Refills   pantoprazole  (PROTONIX ) 40 MG tablet [Pharmacy Med Name: PANTOPRAZOLE  SOD DR 40 MG TAB] 180 tablet 0    Sig: TAKE 1 TABLET BY MOUTH TWICE A DAY     Gastroenterology: Proton Pump Inhibitors Failed - 12/10/2023  9:47 AM      Failed - Valid encounter within last 12 months    Recent Outpatient Visits           2 months ago Subcutaneous nodule of abdominal [place]   [place] [facility] [place], [name], [place]

## 2023-12-24 LAB — OPHTHALMOLOGY REPORT-SCANNED

## 2024-01-10 ENCOUNTER — Other Ambulatory Visit: Payer: Self-pay | Admitting: Internal Medicine

## 2024-01-10 DIAGNOSIS — E785 Hyperlipidemia, unspecified: Secondary | ICD-10-CM

## 2024-01-10 NOTE — Telephone Encounter (Signed)
 Requested Prescriptions  Pending Prescriptions Disp Refills   fenofibrate  (TRICOR ) 145 MG tablet [Pharmacy Med Name: FENOFIBRATE  145 MG TABLET] 90 tablet 0    Sig: TAKE 1 TABLET BY MOUTH EVERY DAY     Cardiovascular:  Antilipid - Fibric Acid Derivatives Failed - 01/10/2024  4:07 PM      Failed - Valid encounter within last 12 months    Recent Outpatient Visits           3 months ago Subcutaneous nodule of abdominal [place]   [place] [facility] [place], [name], DO              Failed - Lipid Panel in normal range within the last 12 months    Cholesterol  Date Value Ref Range Status  04/23/2023 160 <200 mg/dL Final   LDL Cholesterol (Calc)  Date Value Ref Range Status  04/23/2023 69 mg/dL (calc) Final    Comment:    Reference range: <100 . Desirable range <100 mg/dL for primary prevention;   <70 mg/dL for patients with CHD or diabetic patients  with > or = 2 CHD risk factors. SABRA LDL-C is now calculated using the Martin-Hopkins  calculation, which is a validated novel method providing  better accuracy than the Friedewald equation in the  estimation of LDL-C.  [name] 7986;689(80): 2061-2068  (http://education.[facility].com/faq/FAQ164)    Direct LDL  Date Value Ref Range Status  12/17/2019 63.0 mg/dL Final    Comment:    Optimal:  <100 mg/dLNear or Above Optimal:  100-129 mg/dLBorderline High:  130-159 mg/dLHigh:  160-189 mg/dLVery High:  >190 mg/dL   HDL  Date Value Ref Range Status  04/23/2023 63 > OR = 50 mg/dL Final   Triglycerides  Date Value Ref Range Status  04/23/2023 221 (H) <150 mg/dL Final    Comment:    . If a non-fasting specimen was collected, consider repeat triglyceride testing on a fasting specimen if clinically indicated.  [name];[name]          Passed - ALT in normal range and within 360 days    ALT  Date Value Ref Range Status  04/23/2023 26 6 -  29 U/L Final         Passed - AST in normal range and within 360 days    AST  Date Value Ref Range Status  04/23/2023 20 10 - 35 U/L Final         Passed - Cr in normal range and within 360 days    Creat  Date Value Ref Range Status  04/23/2023 0.90 0.50 - 1.05 mg/dL Final   Creatinine, Urine  Date Value Ref Range Status  04/23/2023 112 20 - 275 mg/dL Final         Passed - HGB in normal range and within 360 days    Hemoglobin  Date Value Ref Range Status  04/23/2023 13.1 11.7 - 15.5 g/dL Final         Passed - HCT in normal range and within 360 days    HCT  Date Value Ref Range Status  04/23/2023 40.1 35.0 - 45.0 % Final         Passed - PLT in normal range and within 360 days    Platelets  Date Value Ref Range Status  04/23/2023 343 140 - 400 Thousand/uL Final         Passed - WBC in normal range  and within 360 days    WBC  Date Value Ref Range Status  04/23/2023 9.0 3.8 - 10.8 Thousand/uL Final         Passed - eGFR is 30 or above and within 360 days    GFR, Est African American  Date Value Ref Range Status  09/27/2016 87 >=60 mL/min Final   GFR, Est Non African American  Date Value Ref Range Status  09/27/2016 76 >=60 mL/min Final   GFR  Date Value Ref Range Status  12/17/2019 65.14 >60.00 mL/min Final   eGFR  Date Value Ref Range Status  04/23/2023 71 > OR = 60 mL/min/1.47m2 Final

## 2024-01-17 DIAGNOSIS — B078 Other viral warts: Secondary | ICD-10-CM | POA: Diagnosis not present

## 2024-01-17 DIAGNOSIS — Z1283 Encounter for screening for malignant neoplasm of skin: Secondary | ICD-10-CM | POA: Diagnosis not present

## 2024-01-17 DIAGNOSIS — X32XXXD Exposure to sunlight, subsequent encounter: Secondary | ICD-10-CM | POA: Diagnosis not present

## 2024-01-17 DIAGNOSIS — L308 Other specified dermatitis: Secondary | ICD-10-CM | POA: Diagnosis not present

## 2024-01-17 DIAGNOSIS — L570 Actinic keratosis: Secondary | ICD-10-CM | POA: Diagnosis not present

## 2024-01-17 DIAGNOSIS — D225 Melanocytic nevi of trunk: Secondary | ICD-10-CM | POA: Diagnosis not present

## 2024-01-30 ENCOUNTER — Other Ambulatory Visit: Payer: Self-pay | Admitting: Internal Medicine

## 2024-01-30 DIAGNOSIS — E785 Hyperlipidemia, unspecified: Secondary | ICD-10-CM

## 2024-01-31 NOTE — Telephone Encounter (Signed)
 Requested Prescriptions  Refused Prescriptions Disp Refills   fenofibrate  (TRICOR ) 145 MG tablet [Pharmacy Med Name: FENOFIBRATE  145 MG TABLET] 90 tablet 0    Sig: TAKE 1 TABLET BY MOUTH EVERY DAY     Cardiovascular:  Antilipid - Fibric Acid Derivatives Failed - 01/31/2024  1:47 PM      Failed - Valid encounter within last 12 months    Recent Outpatient Visits           4 months ago Subcutaneous nodule of abdominal [place]   [place] [facility] [place], [name], DO              Failed - Lipid Panel in normal range within the last 12 months    Cholesterol  Date Value Ref Range Status  04/23/2023 160 <200 mg/dL Final   LDL Cholesterol (Calc)  Date Value Ref Range Status  04/23/2023 69 mg/dL (calc) Final    Comment:    Reference range: <100 . Desirable range <100 mg/dL for primary prevention;   <70 mg/dL for patients with CHD or diabetic patients  with > or = 2 CHD risk factors. SABRA LDL-C is now calculated using the Martin-Hopkins  calculation, which is a validated novel method providing  better accuracy than the Friedewald equation in the  estimation of LDL-C.  [name] 7986;689(80): 2061-2068  (http://education.[facility].com/faq/FAQ164)    Direct LDL  Date Value Ref Range Status  12/17/2019 63.0 mg/dL Final    Comment:    Optimal:  <100 mg/dLNear or Above Optimal:  100-129 mg/dLBorderline High:  130-159 mg/dLHigh:  160-189 mg/dLVery High:  >190 mg/dL   HDL  Date Value Ref Range Status  04/23/2023 63 > OR = 50 mg/dL Final   Triglycerides  Date Value Ref Range Status  04/23/2023 221 (H) <150 mg/dL Final    Comment:    . If a non-fasting specimen was collected, consider repeat triglyceride testing on a fasting specimen if clinically indicated.  [name];[name]          Passed - ALT in normal range and within 360 days    ALT  Date Value Ref Range Status  04/23/2023 26 6 -  29 U/L Final         Passed - AST in normal range and within 360 days    AST  Date Value Ref Range Status  04/23/2023 20 10 - 35 U/L Final         Passed - Cr in normal range and within 360 days    Creat  Date Value Ref Range Status  04/23/2023 0.90 0.50 - 1.05 mg/dL Final   Creatinine, Urine  Date Value Ref Range Status  04/23/2023 112 20 - 275 mg/dL Final         Passed - HGB in normal range and within 360 days    Hemoglobin  Date Value Ref Range Status  04/23/2023 13.1 11.7 - 15.5 g/dL Final         Passed - HCT in normal range and within 360 days    HCT  Date Value Ref Range Status  04/23/2023 40.1 35.0 - 45.0 % Final         Passed - PLT in normal range and within 360 days    Platelets  Date Value Ref Range Status  04/23/2023 343 140 - 400 Thousand/uL Final         Passed - WBC in normal range  and within 360 days    WBC  Date Value Ref Range Status  04/23/2023 9.0 3.8 - 10.8 Thousand/uL Final         Passed - eGFR is 30 or above and within 360 days    GFR, Est African American  Date Value Ref Range Status  09/27/2016 87 >=60 mL/min Final   GFR, Est Non African American  Date Value Ref Range Status  09/27/2016 76 >=60 mL/min Final   GFR  Date Value Ref Range Status  12/17/2019 65.14 >60.00 mL/min Final   eGFR  Date Value Ref Range Status  04/23/2023 71 > OR = 60 mL/min/1.39m2 Final          pantoprazole  (PROTONIX ) 40 MG tablet [Pharmacy Med Name: PANTOPRAZOLE  SOD DR 40 MG TAB] 180 tablet 0    Sig: TAKE 1 TABLET BY MOUTH TWICE A DAY     Gastroenterology: Proton Pump Inhibitors Failed - 01/31/2024  1:47 PM      Failed - Valid encounter within last 12 months    Recent Outpatient Visits           4 months ago Subcutaneous nodule of abdominal [place]   [place] [facility] [place] [name], DO

## 2024-02-19 DIAGNOSIS — E1165 Type 2 diabetes mellitus with hyperglycemia: Secondary | ICD-10-CM | POA: Diagnosis not present

## 2024-02-19 DIAGNOSIS — Z23 Encounter for immunization: Secondary | ICD-10-CM | POA: Diagnosis not present

## 2024-02-19 DIAGNOSIS — Z794 Long term (current) use of insulin: Secondary | ICD-10-CM | POA: Diagnosis not present

## 2024-02-19 LAB — HEMOGLOBIN A1C: Hemoglobin A1C: 8

## 2024-02-27 ENCOUNTER — Other Ambulatory Visit: Payer: Self-pay | Admitting: Internal Medicine

## 2024-02-28 NOTE — Telephone Encounter (Signed)
 Requested Prescriptions  Pending Prescriptions Disp Refills   ezetimibe  (ZETIA ) 10 MG tablet [Pharmacy Med Name: EZETIMIBE  10 MG TABLET] 90 tablet 0    Sig: TAKE 1 TABLET BY MOUTH EVERY DAY     Cardiovascular:  Antilipid - Sterol Transport Inhibitors Failed - 02/28/2024 10:29 PM      Failed - Valid encounter within last 12 months    Recent Outpatient Visits           5 months ago Subcutaneous nodule of abdominal [place]   [facility] [place], [name], DO              Failed - Lipid Panel in normal range within the last 12 months    Cholesterol  Date Value Ref Range Status  04/23/2023 160 <200 mg/dL Final   LDL Cholesterol (Calc)  Date Value Ref Range Status  04/23/2023 69 mg/dL (calc) Final    Comment:    Reference range: <100 . Desirable range <100 mg/dL for primary prevention;   <70 mg/dL for patients with CHD or diabetic patients  with > or = 2 CHD risk factors. SABRA LDL-C is now calculated using the Martin-Hopkins  calculation, which is a validated novel method providing  better accuracy than the Friedewald equation in the  estimation of LDL-C.  [name] 7986;689(80): 2061-2068  (http://education.[facility].com/faq/FAQ164)    Direct LDL  Date Value Ref Range Status  12/17/2019 63.0 mg/dL Final    Comment:    Optimal:  <100 mg/dLNear or Above Optimal:  100-129 mg/dLBorderline High:  130-159 mg/dLHigh:  160-189 mg/dLVery High:  >190 mg/dL   HDL  Date Value Ref Range Status  04/23/2023 63 > OR = 50 mg/dL Final   Triglycerides  Date Value Ref Range Status  04/23/2023 221 (H) <150 mg/dL Final    Comment:    . If a non-fasting specimen was collected, consider repeat triglyceride testing on a fasting specimen if clinically indicated.  [name];[name]          Passed - AST in normal range and within 360 days    AST  Date Value Ref Range Status  04/23/2023 20 10 -  35 U/L Final         Passed - ALT in normal range and within 360 days    ALT  Date Value Ref Range Status  04/23/2023 26 6 - 29 U/L Final         Passed - Patient is not pregnant

## 2024-03-11 ENCOUNTER — Telehealth: Payer: Self-pay | Admitting: Internal Medicine

## 2024-03-11 DIAGNOSIS — Z01419 Encounter for gynecological examination (general) (routine) without abnormal findings: Secondary | ICD-10-CM | POA: Diagnosis not present

## 2024-03-11 DIAGNOSIS — N760 Acute vaginitis: Secondary | ICD-10-CM | POA: Diagnosis not present

## 2024-03-11 MED ORDER — ONETOUCH VERIO FLEX SYSTEM W/DEVICE KIT: 1.0000 | PACK | Freq: Three times a day (TID) | 0 refills | Status: AC

## 2024-03-11 NOTE — Telephone Encounter (Signed)
 Copied from CRM #[id number]. Topic: Clinical - Medication Refill >> Mar 11, 2024 11:26 AM Amy B wrote: Medication: Blood Glucose Monitoring Suppl (ONETOUCH VERIO) w/Device KIT  Has the patient contacted their pharmacy? Yes (Agent: If no, request that the patient contact the pharmacy for the refill. If patient does not wish to contact the pharmacy document the reason why and proceed with request.) (Agent: If yes, when and what did the pharmacy advise?)  This is the patient's preferred pharmacy:  [facility] [phone] [place] - [address] [place] [address] Phone: [phone] Fax: [phone]  Is this the correct pharmacy for this prescription? Yes If no, delete pharmacy and type the correct one.   Has the prescription been filled recently? No  Is the patient out of the medication? Yes  Has the patient been seen for an appointment in the last year OR does the patient have an upcoming appointment? Yes  Can we respond through MyChart? Yes  Agent: Please be advised that Rx refills may take up to 3 business days. We ask that you follow-up with your pharmacy.

## 2024-03-11 NOTE — Telephone Encounter (Signed)
 Unable to pend

## 2024-04-17 ENCOUNTER — Ambulatory Visit: Payer: Self-pay

## 2024-04-17 DIAGNOSIS — Z780 Asymptomatic menopausal state: Secondary | ICD-10-CM | POA: Diagnosis not present

## 2024-04-17 DIAGNOSIS — Z0000 Encounter for general adult medical examination without abnormal findings: Secondary | ICD-10-CM

## 2024-04-17 NOTE — Progress Notes (Signed)
 Chief Complaint  Patient presents with   Medicare Wellness     Subjective:   [name] is a 68 y.o. female who presents for a Medicare Annual Wellness Visit.  Fall Screening Falls in the past year?: 0  Advance Directives (For Healthcare) Does Patient Have a Medical Advance Directive?: Yes Does patient want to make changes to medical advance directive?: No - Patient declined Type of Advance Directive: Healthcare Power of [place]; Living will Copy of Healthcare Power of Attorney in Chart?: Yes - validated most recent copy scanned in chart (See row information)    Allergies (verified) Jardiance  [empagliflozin ] and Sulfa antibiotics   Current Medications (verified) Outpatient Encounter Medications as of 04/17/2024  Medication Sig   aspirin EC 81 MG tablet Take 81 mg by mouth daily. Swallow whole.   atorvastatin  (LIPITOR) 10 MG tablet TAKE 1 TABLET BY MOUTH EVERY DAY   BD PEN NEEDLE MINI ULTRAFINE 31G X 5 MM MISC USE 4 TIMES A DAY   Blood Glucose Monitoring Suppl (ONETOUCH VERIO FLEX SYSTEM) w/Device KIT 1 kit by Does not apply route in the morning, at noon, and at bedtime.   Blood Glucose Monitoring Suppl (ONETOUCH VERIO) w/Device KIT Use as instructed to check blood sugar 3X daily   CALCIUM  PO Take 1,200 mg by mouth daily.   Cholecalciferol (VITAMIN D -3) 25 MCG (1000 UT) CAPS Take 1,000 Units by mouth daily.   diazepam (VALIUM) 2 MG tablet Take by mouth. (Patient taking differently: Take by mouth. Takes PRN)   fenofibrate  (TRICOR ) 145 MG tablet TAKE 1 TABLET BY MOUTH EVERY DAY   Ferrous Sulfate (IRON PO) Take by mouth.   glipiZIDE  (GLUCOTROL ) 10 MG tablet Take 0.5 tablets (5 mg total) by mouth 2 (two) times daily before a meal.   glucose blood (ONETOUCH VERIO) test strip USE AS DIRECTED TO CHECK BLOOD SUGAR 3 TIMES A DAY   insulin  glargine (LANTUS  SOLOSTAR) 100 UNIT/ML Solostar Pen INJECT 44 UNITS INTO THE SKIN DAILY   metFORMIN  (GLUCOPHAGE ) 1000 MG tablet TAKE 1 TABLET  (1,000 MG TOTAL) BY MOUTH TWICE A DAY WITH FOOD   Multiple Vitamins-Minerals (MULTIVITAMINS THER. W/MINERALS) TABS Take 1 tablet by mouth daily.   mupirocin  ointment (BACTROBAN ) 2 % Apply 1 Application topically 2 (two) times daily.   Omega-3 Fatty Acids (FISH OIL) 1000 MG CAPS Take 1,000 mg by mouth 2 (two) times daily.   ondansetron  (ZOFRAN -ODT) 8 MG disintegrating tablet 8mg  ODT q4 hours prn nausea   pantoprazole  (PROTONIX ) 40 MG tablet TAKE 1 TABLET BY MOUTH TWICE A DAY   tirzepatide  (MOUNJARO ) 7.5 MG/0.5ML Pen Inject 7.5 mg into the skin once a week.   Zinc Gluconate-Vitamin C (BL ZINC-VITAMIN C LOZENGE MT) Use as directed 1 tablet in the mouth or throat daily.   Biotin 5 MG CAPS Take by mouth. (Patient not taking: Reported on 04/17/2024)   ezetimibe  (ZETIA ) 10 MG tablet TAKE 1 TABLET BY MOUTH EVERY DAY (Patient not taking: Reported on 04/17/2024)   OZEMPIC , 1 MG/DOSE, 4 MG/3ML SOPN Inject 1 mg into the skin once a week. (Patient not taking: Reported on 04/17/2024)   scopolamine  (TRANSDERM-SCOP) 1 MG/3DAYS Place 1 patch (1.5 mg total) onto the skin every 3 (three) days. For motion sickness. Start 2 hr before onset symptoms, up to 12 hr before (Patient not taking: Reported on 04/17/2024)   No facility-administered encounter medications on file as of 04/17/2024.    History: Past Medical History:  Diagnosis Date   Blood transfusion  1984   [facility]   Chronic kidney disease    occasional renal stone   Complication of anesthesia    woke during surgery (ablation) 2006?   Deaf, right    Diabetes mellitus    type 2 Diab- A1C-7   Encounter for general adult medical examination with abnormal findings 02/02/2022   Family history of adverse reaction to anesthesia    Mother - PONV   GERD (gastroesophageal reflux disease)    occasional Protonix -will take protonix  night before surgery   Hypercholesteremia    on meds   PONV (postoperative nausea and vomiting)    Vertigo    Wears hearing aid  in both ears    Past Surgical History:  Procedure Laterality Date   BLADDER SUSPENSION  12/08/2010   Procedure: TRANSVAGINAL TAPE (TVT) PROCEDURE;  Surgeon: Bobie DELENA Cary;  Location: [facility];  Service: Gynecology;  Laterality: N/A;  transvaginal tape with cystoscopy   BLADDER SUSPENSION  01/30/2011   Procedure: TRANSVAGINAL TAPE (TVT) PROCEDURE;  Surgeon: Bobie DELENA Cary;  Location: [facility];  Service: Gynecology;  Laterality: N/A;  excision of excess sling from suprapubic incision and cystoscopy   CERVICAL DISC SURGERY  05/07/2002   COLONOSCOPY WITH PROPOFOL  N/A 05/20/2023   Procedure: COLONOSCOPY WITH PROPOFOL ;  Surgeon: Jinny Carmine, MD;  Location: [facility];  Service: Endoscopy;  Laterality: N/A;  Diabetic   CYSTOSCOPY  01/30/2011   Procedure: CYSTOSCOPY;  Surgeon: Bobie DELENA Cary;  Location: [facility];  Service: Gynecology;  Laterality: N/A;   NECK SURGERY     2005   TONSILLECTOMY  05/07/1974   transvaginal tape  12/06/2010   uterine ablation     Family History  Problem Relation Age of Onset   Diabetes Mother    Hypertension Mother    Hyperlipidemia Mother    Heart disease Mother    Heart disease Father    Hyperlipidemia Father    Diabetes Father    Heart attack Father    Varicose Veins Father    Peripheral vascular disease Father        amputation   Cancer Paternal Aunt        lung/ brain   Diabetes Maternal Grandmother    Social History   Occupational History   Not on file  Tobacco Use   Smoking status: Former    Current packs/day: 0.00    Average packs/day: 0.8 packs/day for 30.8 years (23.1 ttl pk-yrs)    Types: Cigarettes    Start date: 86    Quit date: 03/01/2015    Years since quitting: 9.1   Smokeless tobacco: Never   Tobacco comments:    recently quit 03/01/2015  Vaping Use   Vaping status: Never Used  Substance and Sexual Activity   Alcohol use: No    Alcohol/week: 0.0 standard drinks of alcohol   Drug use: No   Sexual activity: Yes   Tobacco  Counseling Counseling given: Not Answered Tobacco comments: recently quit 03/01/2015  SDOH Screenings   Food Insecurity: No Food Insecurity (04/23/2023)  Housing: Unknown (11/18/2023)   Received from [facility]  Transportation Needs: No Transportation Needs (04/23/2023)  Utilities: Not At Risk (04/23/2023)  Alcohol Screen: Low Risk (04/12/2023)  Depression (PHQ2-9): Medium Risk (09/16/2023)  Financial Resource Strain: Low Risk (04/23/2023)  Physical Activity: Insufficiently Active (04/23/2023)  Social Connections: Moderately Integrated (04/23/2023)  Recent Concern: Social Connections - Moderately Isolated (04/12/2023)  Stress: Stress Concern Present (04/23/2023)  Tobacco Use: Medium Risk (04/17/2024)  Health Literacy:  Adequate Health Literacy (04/23/2023)   See flowsheets for full screening details  Depression Screen PHQ 2 & 9 Depression Scale- Over the past 2 weeks, how often have you been bothered by any of the following problems? Little interest or pleasure in doing things: 0 Feeling down, depressed, or hopeless (PHQ Adolescent also includes...irritable): 0 PHQ-2 Total Score: 0 Trouble falling or staying asleep, or sleeping too much: 3 Feeling tired or having little energy: 2 Poor appetite or overeating (PHQ Adolescent also includes...weight loss): 3 Feeling bad about yourself - or that you are a failure or have let yourself or your family down: 0 Trouble concentrating on things, such as reading the newspaper or watching television (PHQ Adolescent also includes...like school work): 0 Moving or speaking so slowly that other people could have noticed. Or the opposite - being so fidgety or restless that you have been moving around a lot more than usual: 0 Thoughts that you would be better off dead, or of hurting yourself in some way: 0 PHQ-9 Total Score: 8 If you checked off any problems, how difficult have these problems made it for you to do your work, take care  of things at home, or get along with other people?: Not difficult at all     Goals Addressed             This Visit's Progress    DIET - REDUCE SUGAR INTAKE               Objective:    There were no vitals filed for this visit. There is no height or weight on file to calculate BMI.  Hearing/Vision screen No results found. Immunizations and Health Maintenance Health Maintenance  Topic Date Due   Zoster Vaccines- Shingrix (1 of 2) 02/03/2008   Pneumococcal Vaccine: 50+ Years (2 of 2 - PCV) 04/07/2023   HEMOGLOBIN A1C  10/22/2023   OPHTHALMOLOGY EXAM  12/24/2023   Diabetic kidney evaluation - eGFR measurement  04/22/2024   Diabetic kidney evaluation - Urine ACR  04/22/2024   Lung Cancer Screening  04/22/2024 (Originally 02/03/2008)   DTaP/Tdap/Td (2 - Td or Tdap) 04/22/2024 (Originally 11/20/2021)   FOOT EXAM  04/22/2024   Mammogram  11/12/2024   Medicare Annual Wellness (AWV)  04/17/2025   Colonoscopy  05/19/2033   Influenza Vaccine  Completed   Bone Density Scan  Completed   Hepatitis C Screening  Completed   Meningococcal B Vaccine  Aged Out   COVID-19 Vaccine  Discontinued        Assessment/Plan:  This is a routine wellness examination for Mistina.  Patient Care Team: Antonette Angeline ORN, NP as PCP - General (Internal Medicine) Pa, [facility]  I have personally reviewed and noted the following in the patients chart:   Medical and social history Use of alcohol, tobacco or illicit drugs  Current medications and supplements including opioid prescriptions. Functional ability and status Nutritional status Physical activity Advanced directives List of other physicians Hospitalizations, surgeries, and ER visits in previous 12 months Vitals Screenings to include cognitive, depression, and falls Referrals and appointments  No orders of the defined types were placed in this encounter.  In addition, I have reviewed and discussed with patient certain  preventive protocols, quality metrics, and best practice recommendations. A written personalized care plan for preventive services as well as general preventive health recommendations were provided to patient.   Jhonnie GORMAN Das, LPN   [id number]   Return in 1 year (on 04/17/2025).  After Visit Summary: (MyChart) Due to this being a telephonic visit, the after visit summary with patients personalized plan was offered to patient via MyChart   Nurse Notes: NEEDS TDAP;DECLINES SHINGRIX;UTD ON MAMMOGRAM, COLONOSCOPY; DECLINES LUNG CA SCREENING; BDS ORDERED

## 2024-04-17 NOTE — Patient Instructions (Addendum)
 Ms. Trager,  Thank you for taking the time for your Medicare Wellness Visit. I appreciate your continued commitment to your health goals. Please review the care plan we discussed, and feel free to reach out if I can assist you further.  Please note that Annual Wellness Visits do not include a physical exam. Some assessments may be limited, especially if the visit was conducted virtually. If needed, we may recommend an in-person follow-up with your provider.  Ongoing Care Seeing your primary care provider every 3 to 6 months helps us  monitor your health and provide consistent, personalized care.   Referrals If a referral was made during today's visit and you haven't received any updates within two weeks, please contact the referred provider directly to check on the status.  REFERRAL FOR BONE DENSITY SCAN SENT: You have an order for:  []   2D Mammogram  []   3D Mammogram  [x]   Bone Density     Please call for appointment:  [facility]  [address] [place] [address] [phone] [facility] [address] [place], [place] [address] [phone] [facility] [address] [place], [place] [address] [phone]   Make sure to wear two-piece clothing.  No lotions, powders, or deodorants the day of the appointment. Make sure to bring picture ID and insurance card.  Bring list of medications you are currently taking including any supplements.   Schedule your [place] screening mammogram through MyChart!   Log into your MyChart account.  Go to 'Visit' (or 'Appointments' if on mobile App) --> Schedule an Appointment  Under 'Select a Reason for Visit' choose the Mammogram Screening option.  Complete the pre-visit questions and select the time and place that best fits your schedule.   Recommended Screenings:  Health Maintenance  Topic Date Due    Zoster (Shingles) Vaccine (1 of 2) 02/03/2008   Pneumococcal Vaccine for age over 72 (2 of 2 - PCV) 04/07/2023   Osteoporosis screening with Bone Density Scan  04/14/2023   Hemoglobin A1C  10/22/2023   Eye exam for diabetics  12/24/2023   Yearly kidney function blood test for diabetes  04/22/2024   Yearly kidney health urinalysis for diabetes  04/22/2024   Screening for Lung Cancer  04/22/2024*   DTaP/Tdap/Td vaccine (2 - Td or Tdap) 04/22/2024*   Complete foot exam   04/22/2024   Breast Cancer Screening  11/12/2024   Medicare Annual Wellness Visit  04/17/2025   Colon Cancer Screening  05/19/2033   Flu Shot  Completed   Hepatitis C Screening  Completed   Meningitis B Vaccine  Aged Out   COVID-19 Vaccine  Discontinued  *Topic was postponed. The date shown is not the original due date.     Vision: Annual vision screenings are recommended for early detection of glaucoma, cataracts, and diabetic retinopathy. These exams can also reveal signs of chronic conditions such as diabetes and high blood pressure.  Dental: Annual dental screenings help detect early signs of oral cancer, gum disease, and other conditions linked to overall health, including heart disease and diabetes.  Please see the attached documents for additional preventive care recommendations.    NEXT AWV 04/21/25 @ 10:10 AM IN PERSON

## 2024-04-23 NOTE — Progress Notes (Unsigned)
 Subjective:    Patient ID: [name], female    DOB: January 25, 1956, 68 y.o.   MRN: [id number]  HPI  Patient presents to clinic today for annual exam.  Flu: 02/2024 Tetanus: 11/2011 COVID: Never Pneumovax: 04/2022 Shingrix: Never Zostavax: 07/2012 Pap smear: 02/2017, [facility] Mammogram: 11/2023 Bone density: 04/2021 Colon screening: 05/2023 Vision screening: annually Dentist: biannually  Diet: She does eat meat. She consumes fruits and veggies. She tries to avoid fried foods. She drinks moslty water . Exercise: rowing machine 30 minutes daily  Review of Systems     Past Medical History:  Diagnosis Date   Blood transfusion    1984   [facility]   Chronic kidney disease    occasional renal stone   Complication of anesthesia    woke during surgery (ablation) 2006?   Deaf, right    Diabetes mellitus    type 2 Diab- A1C-7   Encounter for general adult medical examination with abnormal findings 02/02/2022   Family history of adverse reaction to anesthesia    Mother - PONV   GERD (gastroesophageal reflux disease)    occasional Protonix -will take protonix  night before surgery   Hypercholesteremia    on meds   PONV (postoperative nausea and vomiting)    Vertigo    Wears hearing aid in both ears     Current Outpatient Medications  Medication Sig Dispense Refill   aspirin EC 81 MG tablet Take 81 mg by mouth daily. Swallow whole.     atorvastatin  (LIPITOR) 10 MG tablet TAKE 1 TABLET BY MOUTH EVERY DAY 90 tablet 2   BD PEN NEEDLE MINI ULTRAFINE 31G X 5 MM MISC USE 4 TIMES A DAY 200 each 3   Biotin 5 MG CAPS Take by mouth. (Patient not taking: Reported on 04/17/2024)     Blood Glucose Monitoring Suppl (ONETOUCH VERIO FLEX SYSTEM) w/Device KIT 1 kit by Does not apply route in the morning, at noon, and at bedtime. 1 kit 0   Blood Glucose Monitoring Suppl (ONETOUCH VERIO) w/Device KIT Use as instructed to check blood sugar 3X daily 1 kit 0   CALCIUM  PO Take 1,200 mg by mouth  daily.     Cholecalciferol (VITAMIN D -3) 25 MCG (1000 UT) CAPS Take 1,000 Units by mouth daily.     diazepam (VALIUM) 2 MG tablet Take by mouth. (Patient taking differently: Take by mouth. Takes PRN)     ezetimibe  (ZETIA ) 10 MG tablet TAKE 1 TABLET BY MOUTH EVERY DAY (Patient not taking: Reported on 04/17/2024) 90 tablet 0   fenofibrate  (TRICOR ) 145 MG tablet TAKE 1 TABLET BY MOUTH EVERY DAY 90 tablet 0   Ferrous Sulfate (IRON PO) Take by mouth.     glipiZIDE  (GLUCOTROL ) 10 MG tablet Take 0.5 tablets (5 mg total) by mouth 2 (two) times daily before a meal. 180 tablet 1   glucose blood (ONETOUCH VERIO) test strip USE AS DIRECTED TO CHECK BLOOD SUGAR 3 TIMES A DAY 300 strip 0   insulin  glargine (LANTUS  SOLOSTAR) 100 UNIT/ML Solostar Pen INJECT 44 UNITS INTO THE SKIN DAILY 15 mL 1   metFORMIN  (GLUCOPHAGE ) 1000 MG tablet TAKE 1 TABLET (1,000 MG TOTAL) BY MOUTH TWICE A DAY WITH FOOD 180 tablet 1   Multiple Vitamins-Minerals (MULTIVITAMINS THER. W/MINERALS) TABS Take 1 tablet by mouth daily.     mupirocin  ointment (BACTROBAN ) 2 % Apply 1 Application topically 2 (two) times daily. 22 g 0   Omega-3 Fatty Acids (FISH OIL) 1000 MG CAPS Take  1,000 mg by mouth 2 (two) times daily.     ondansetron  (ZOFRAN -ODT) 8 MG disintegrating tablet 8mg  ODT q4 hours prn nausea 30 tablet 0   OZEMPIC , 1 MG/DOSE, 4 MG/3ML SOPN Inject 1 mg into the skin once a week. (Patient not taking: Reported on 04/17/2024) 9 mL 2   pantoprazole  (PROTONIX ) 40 MG tablet TAKE 1 TABLET BY MOUTH TWICE A DAY 180 tablet 0   scopolamine  (TRANSDERM-SCOP) 1 MG/3DAYS Place 1 patch (1.5 mg total) onto the skin every 3 (three) days. For motion sickness. Start 2 hr before onset symptoms, up to 12 hr before (Patient not taking: Reported on 04/17/2024) 10 patch 0   tirzepatide  (MOUNJARO ) 7.5 MG/0.5ML Pen Inject 7.5 mg into the skin once a week.     Zinc Gluconate-Vitamin C (BL ZINC-VITAMIN C LOZENGE MT) Use as directed 1 tablet in the mouth or throat  daily.     No current facility-administered medications for this visit.    Allergies  Allergen Reactions   Canagliflozin  Dermatitis    Skin peelin   Empagliflozin      Myalgias  Other Reaction(s): Cramps   Insulin  Aspart (Human Analog) (Yeast)     Other Reaction(s): Unknown   Metformin  Hcl     Other Reaction(s): Unknown   Sulfa Antibiotics Swelling    Other reaction(s): Edema    Family History  Problem Relation Age of Onset   Diabetes Mother    Hypertension Mother    Hyperlipidemia Mother    Heart disease Mother    Heart disease Father    Hyperlipidemia Father    Diabetes Father    Heart attack Father    Varicose Veins Father    Peripheral vascular disease Father        amputation   Cancer Paternal Aunt        lung/ brain   Diabetes Maternal Grandmother     Social History   Socioeconomic History   Marital status: Widowed    Spouse name: Not on file   Number of children: Not on file   Years of education: Not on file   Highest education level: 12th grade  Occupational History   Not on file  Tobacco Use   Smoking status: Former    Current packs/day: 0.00    Average packs/day: 0.8 packs/day for 30.8 years (23.1 ttl pk-yrs)    Types: Cigarettes    Start date: 58    Quit date: 03/01/2015    Years since quitting: 9.1   Smokeless tobacco: Never   Tobacco comments:    recently quit 03/01/2015  Vaping Use   Vaping status: Never Used  Substance and Sexual Activity   Alcohol use: No    Alcohol/week: 0.0 standard drinks of alcohol   Drug use: No   Sexual activity: Yes  Other Topics Concern   Not on file  Social History Narrative   Right handed   Retired   Social Drivers of Health   Tobacco Use: Medium Risk (04/17/2024)   Patient History    Smoking Tobacco Use: Former    Smokeless Tobacco Use: Never    Passive Exposure: Not on Actuary Strain: Low Risk (04/23/2023)   Overall Financial Resource Strain (CARDIA)    Difficulty of Paying  Living Expenses: Not hard at all  Food Insecurity: No Food Insecurity (04/17/2024)   Epic    Worried About Radiation Protection Practitioner of Food in the Last Year: Never true    Ran Out of Food in the Last  Year: Never true  Transportation Needs: No Transportation Needs (04/17/2024)   Epic    Lack of Transportation (Medical): No    Lack of Transportation (Non-Medical): No  Physical Activity: Insufficiently Active (04/17/2024)   Exercise Vital Sign    Days of Exercise per Week: 4 days    Minutes of Exercise per Session: 30 min  Stress: No Stress Concern Present (04/17/2024)   [employer] of Occupational Health - Occupational Stress Questionnaire    Feeling of Stress: Only a little  Social Connections: Moderately Integrated (04/17/2024)   Social Connection and Isolation Panel    Frequency of Communication with Friends and Family: More than three times a week    Frequency of Social Gatherings with Friends and Family: Three times a week    Attends Religious Services: More than 4 times per year    Active Member of Clubs or Organizations: Yes    Attends Banker Meetings: More than 4 times per year    Marital Status: Widowed  Intimate Partner Violence: Not At Risk (04/17/2024)   Epic    Fear of Current or Ex-Partner: No    Emotionally Abused: No    Physically Abused: No    Sexually Abused: No  Depression (PHQ2-9): Low Risk (04/17/2024)   Depression (PHQ2-9)    PHQ-2 Score: 0  Alcohol Screen: Low Risk (04/12/2023)   Alcohol Screen    Last Alcohol Screening Score (AUDIT): 0  Housing: Unknown (04/17/2024)   Epic    Unable to Pay for Housing in the Last Year: No    Number of Times Moved in the Last Year: Not on file    Homeless in the Last Year: No  Utilities: Not At Risk (04/17/2024)   Epic    Threatened with loss of utilities: No  Health Literacy: Adequate Health Literacy (04/17/2024)   B1300 Health Literacy    Frequency of need for help with medical instructions: Never      Constitutional: Denies fever, malaise, fatigue, headache or abrupt weight changes.  HEENT: Denies eye pain, eye redness, ear pain, ringing in the ears, wax buildup, runny nose, nasal congestion, bloody nose, or sore throat. Respiratory: Denies difficulty breathing, shortness of breath, cough or sputum production.   Cardiovascular: Denies chest pain, chest tightness, palpitations or swelling in the hands or feet.  Gastrointestinal: Denies abdominal pain, bloating, constipation, diarrhea or blood in the stool.  GU: Denies urgency, frequency, pain with urination, burning sensation, blood in urine, odor or discharge. Musculoskeletal: Patient reports joint pain.  Denies decrease in range of motion, difficulty with gait, muscle pain or joint swelling.  Skin: Denies redness, rashes, lesions or ulcercations.  Neurological: Patient reports insomnia, intermittent paresthesia of toes.  Denies difficulty with memory, difficulty with speech or problems with balance and coordination.  Psych: Pt has a history of anxiety. Denies depression, SI/HI.  No other specific complaints in a complete review of systems (except as listed in HPI above).  Objective:   Physical Exam  There were no vitals taken for this visit.   Wt Readings from Last 3 Encounters:  09/16/23 180 lb 6 oz (81.8 kg)  05/20/23 (P) 174 lb 11.2 oz (79.2 kg)  04/23/23 179 lb (81.2 kg)    General: Appears her stated age, obese, in NAD. Skin: Warm, dry and intact. No ulcerations noted. HEENT: Head: normal shape and size; Eyes: sclera white, no icterus, conjunctiva pink, PERRLA and EOMs intact;  Neck:  Neck supple, trachea midline. No masses, lumps or thyromegaly present.  Cardiovascular: Normal rate and rhythm. S1,S2 noted.  No murmur, rubs or gallops noted. No JVD or BLE edema. No carotid bruits noted. Pulmonary/Chest: Normal effort and positive vesicular breath sounds. No respiratory distress. No wheezes, rales or ronchi noted.   Abdomen: Normal bowel sounds.  Musculoskeletal: Strength 5/5 BUE/BLE.  No difficulty with gait.  Neurological: Alert and oriented. Cranial nerves II-XII grossly intact. Coordination normal.  Psychiatric: Mood and affect normal.  Mildly anxious appearing. Judgment and thought content normal.    BMET    Component Value Date/Time   NA 138 04/23/2023 0900   K 4.4 04/23/2023 0900   CL 100 04/23/2023 0900   CO2 27 04/23/2023 0900   GLUCOSE 124 (H) 04/23/2023 0900   BUN 18 04/23/2023 0900   CREATININE 0.90 04/23/2023 0900   CALCIUM  10.4 04/23/2023 0900   GFRNONAA 76 09/27/2016 0854   GFRAA 87 09/27/2016 0854    Lipid Panel     Component Value Date/Time   CHOL 160 04/23/2023 0900   TRIG 221 (H) 04/23/2023 0900   HDL 63 04/23/2023 0900   CHOLHDL 2.5 04/23/2023 0900   VLDL 60.8 (H) 12/17/2019 0901   LDLCALC 69 04/23/2023 0900    CBC    Component Value Date/Time   WBC 9.0 04/23/2023 0900   RBC 4.53 04/23/2023 0900   HGB 13.1 04/23/2023 0900   HCT 40.1 04/23/2023 0900   PLT 343 04/23/2023 0900   MCV 88.5 04/23/2023 0900   MCH 28.9 04/23/2023 0900   MCHC 32.7 04/23/2023 0900   RDW 12.7 04/23/2023 0900   LYMPHSABS 3,576 12/29/2021 1437   MONOABS 0.6 01/07/2014 1402   EOSABS 0 (L) 12/29/2021 1437   BASOSABS 64 12/29/2021 1437    Hgb A1C Lab Results  Component Value Date   HGBA1C 8.0 (H) 04/23/2023           Assessment & Plan:   Preventative Health Maintenance:  Flu shot UTD She declines tetanus for financial reasons, advised if she gets bit or cut to go get this done Pneumovax UTD Prevnar 20 Zostavax UTD Discussed Shingrix vaccine, she will check coverage with her insurance company and schedule a visit at the pharmacy if she would like to have this done Encouraged her to do her COVID-vaccine Pap smear UTD per her report, will request copy Mammogram ordered-she will call to schedule Bone density ordered-she will call to schedule Colon screening UTD She  declines lung cancer screening Encouraged her to consume a balanced diet and exercise regimen Advised her to see an eye doctor and dentist annually We will check CBC, c-Met, lipid, A1c and urine microalbumin today  RTC in 6 months follow-up chronic conditions Angeline Laura, NP

## 2024-04-24 ENCOUNTER — Encounter: Payer: Self-pay | Admitting: Internal Medicine

## 2024-04-24 ENCOUNTER — Ambulatory Visit: Admitting: Internal Medicine

## 2024-04-24 ENCOUNTER — Other Ambulatory Visit: Payer: Self-pay | Admitting: Internal Medicine

## 2024-04-24 VITALS — BP 120/74 | Ht 64.75 in | Wt 175.6 lb

## 2024-04-24 DIAGNOSIS — Z7984 Long term (current) use of oral hypoglycemic drugs: Secondary | ICD-10-CM | POA: Diagnosis not present

## 2024-04-24 DIAGNOSIS — F419 Anxiety disorder, unspecified: Secondary | ICD-10-CM

## 2024-04-24 DIAGNOSIS — Z23 Encounter for immunization: Secondary | ICD-10-CM | POA: Diagnosis not present

## 2024-04-24 DIAGNOSIS — F329 Major depressive disorder, single episode, unspecified: Secondary | ICD-10-CM | POA: Diagnosis not present

## 2024-04-24 DIAGNOSIS — F5104 Psychophysiologic insomnia: Secondary | ICD-10-CM

## 2024-04-24 DIAGNOSIS — Z6829 Body mass index (BMI) 29.0-29.9, adult: Secondary | ICD-10-CM | POA: Diagnosis not present

## 2024-04-24 DIAGNOSIS — Z794 Long term (current) use of insulin: Secondary | ICD-10-CM | POA: Diagnosis not present

## 2024-04-24 DIAGNOSIS — E1165 Type 2 diabetes mellitus with hyperglycemia: Secondary | ICD-10-CM

## 2024-04-24 DIAGNOSIS — Z1231 Encounter for screening mammogram for malignant neoplasm of breast: Secondary | ICD-10-CM

## 2024-04-24 DIAGNOSIS — Z7985 Long-term (current) use of injectable non-insulin antidiabetic drugs: Secondary | ICD-10-CM | POA: Diagnosis not present

## 2024-04-24 DIAGNOSIS — E663 Overweight: Secondary | ICD-10-CM

## 2024-04-24 DIAGNOSIS — Z0001 Encounter for general adult medical examination with abnormal findings: Secondary | ICD-10-CM

## 2024-04-24 MED ORDER — PAROXETINE HCL ER 12.5 MG PO TB24: 12.5000 mg | ORAL_TABLET | Freq: Every day | ORAL | 1 refills | Status: AC

## 2024-04-24 MED ORDER — BUSPIRONE HCL 10 MG PO TABS: 10.0000 mg | ORAL_TABLET | Freq: Two times a day (BID) | ORAL | 1 refills | Status: AC | PRN

## 2024-04-24 NOTE — Assessment & Plan Note (Signed)
 Failed trazodone  in the past We will hold off on any additional sleep aids at this time as we are starting paroxetine  and buspirone  for anxiety and depression

## 2024-04-24 NOTE — Assessment & Plan Note (Signed)
Urine microalbumin today. 

## 2024-04-24 NOTE — Assessment & Plan Note (Signed)
 Deteriorated We will trial paroxetine  12.5 mg CR daily We will trial buspirone  10 mg twice daily as needed for breakthrough She is not interested in referral to therapy at this time Support offered

## 2024-04-24 NOTE — Patient Instructions (Signed)
 Health Maintenance for Postmenopausal Women Menopause is a normal process in which your ability to get pregnant comes to an end. This process happens slowly over many months or years, usually between the ages of 76 and 38. Menopause is complete when you have missed your menstrual period for 12 months. It is important to talk with your health care provider about some of the most common conditions that affect women after menopause (postmenopausal women). These include heart disease, cancer, and bone loss (osteoporosis). Adopting a healthy lifestyle and getting preventive care can help to promote your health and wellness. The actions you take can also lower your chances of developing some of these common conditions. What are the signs and symptoms of menopause? During menopause, you may have the following symptoms: Hot flashes. These can be moderate or severe. Night sweats. Decrease in sex drive. Mood swings. Headaches. Tiredness (fatigue). Irritability. Memory problems. Problems falling asleep or staying asleep. Talk with your health care provider about treatment options for your symptoms. Do I need hormone replacement therapy? Hormone replacement therapy is effective in treating symptoms that are caused by menopause, such as hot flashes and night sweats. Hormone replacement carries certain risks, especially as you become older. If you are thinking about using estrogen or estrogen with progestin, discuss the benefits and risks with your health care provider. How can I reduce my risk for heart disease and stroke? The risk of heart disease, heart attack, and stroke increases as you age. One of the causes may be a change in the body's hormones during menopause. This can affect how your body uses dietary fats, triglycerides, and cholesterol. Heart attack and stroke are medical emergencies. There are many things that you can do to help prevent heart disease and stroke. Watch your blood pressure High  blood pressure causes heart disease and increases the risk of stroke. This is more likely to develop in people who have high blood pressure readings or are overweight. Have your blood pressure checked: Every 3-5 years if you are 32-23 years of age. Every year if you are 31 years old or older. Eat a healthy diet  Eat a diet that includes plenty of vegetables, fruits, low-fat dairy products, and lean protein. Do not eat a lot of foods that are high in solid fats, added sugars, or sodium. Get regular exercise Get regular exercise. This is one of the most important things you can do for your health. Most adults should: Try to exercise for at least 150 minutes each week. The exercise should increase your heart rate and make you sweat (moderate-intensity exercise). Try to do strengthening exercises at least twice each week. Do these in addition to the moderate-intensity exercise. Spend less time sitting. Even light physical activity can be beneficial. Other tips Work with your health care provider to achieve or maintain a healthy weight. Do not use any products that contain nicotine or tobacco. These products include cigarettes, chewing tobacco, and vaping devices, such as e-cigarettes. If you need help quitting, ask your health care provider. Know your numbers. Ask your health care provider to check your cholesterol and your blood sugar (glucose). Continue to have your blood tested as directed by your health care provider. Do I need screening for cancer? Depending on your health history and family history, you may need to have cancer screenings at different stages of your life. This may include screening for: Breast cancer. Cervical cancer. Lung cancer. Colorectal cancer. What is my risk for osteoporosis? After menopause, you may be  at increased risk for osteoporosis. Osteoporosis is a condition in which bone destruction happens more quickly than new bone creation. To help prevent osteoporosis or  the bone fractures that can happen because of osteoporosis, you may take the following actions: If you are 24-54 years old, get at least 1,000 mg of calcium and at least 600 international units (IU) of vitamin D  per day. If you are older than age 75 but younger than age 30, get at least 1,200 mg of calcium and at least 600 international units (IU) of vitamin D  per day. If you are older than age 8, get at least 1,200 mg of calcium and at least 800 international units (IU) of vitamin D  per day. Smoking and drinking excessive alcohol increase the risk of osteoporosis. Eat foods that are rich in calcium and vitamin D , and do weight-bearing exercises several times each week as directed by your health care provider. How does menopause affect my mental health? Depression may occur at any age, but it is more common as you become older. Common symptoms of depression include: Feeling depressed. Changes in sleep patterns. Changes in appetite or eating patterns. Feeling an overall lack of motivation or enjoyment of activities that you previously enjoyed. Frequent crying spells. Talk with your health care provider if you think that you are experiencing any of these symptoms. General instructions See your health care provider for regular wellness exams and vaccines. This may include: Scheduling regular health, dental, and eye exams. Getting and maintaining your vaccines. These include: Influenza vaccine. Get this vaccine each year before the flu season begins. Pneumonia vaccine. Shingles vaccine. Tetanus, diphtheria, and pertussis (Tdap) booster vaccine. Your health care provider may also recommend other immunizations. Tell your health care provider if you have ever been abused or do not feel safe at home. Summary Menopause is a normal process in which your ability to get pregnant comes to an end. This condition causes hot flashes, night sweats, decreased interest in sex, mood swings, headaches, or lack  of sleep. Treatment for this condition may include hormone replacement therapy. Take actions to keep yourself healthy, including exercising regularly, eating a healthy diet, watching your weight, and checking your blood pressure and blood sugar levels. Get screened for cancer and depression. Make sure that you are up to date with all your vaccines. This information is not intended to replace advice given to you by your health care provider. Make sure you discuss any questions you have with your health care provider. Document Revised: 09/12/2020 Document Reviewed: 09/12/2020 Elsevier Patient Education  2024 [employer].

## 2024-04-24 NOTE — Assessment & Plan Note (Signed)
 Encouraged diet and exercise for weight loss ?

## 2024-04-25 LAB — LIPID PANEL
Cholesterol: 218 mg/dL — ABNORMAL HIGH
HDL: 64 mg/dL
LDL Cholesterol (Calc): 116 mg/dL — ABNORMAL HIGH
Non-HDL Cholesterol (Calc): 154 mg/dL — ABNORMAL HIGH
Total CHOL/HDL Ratio: 3.4 (calc)
Triglycerides: 267 mg/dL — ABNORMAL HIGH

## 2024-04-25 LAB — COMPREHENSIVE METABOLIC PANEL WITH GFR
AG Ratio: 1.7 (calc) (ref 1.0–2.5)
ALT: 31 U/L — ABNORMAL HIGH (ref 6–29)
AST: 27 U/L (ref 10–35)
Albumin: 4.3 g/dL (ref 3.6–5.1)
Alkaline phosphatase (APISO): 57 U/L (ref 37–153)
BUN: 17 mg/dL (ref 7–25)
CO2: 22 mmol/L (ref 20–32)
Calcium: 9.1 mg/dL (ref 8.6–10.4)
Chloride: 105 mmol/L (ref 98–110)
Creat: 0.74 mg/dL (ref 0.50–1.05)
Globulin: 2.5 g/dL (ref 1.9–3.7)
Glucose, Bld: 172 mg/dL — ABNORMAL HIGH (ref 65–99)
Potassium: 4.4 mmol/L (ref 3.5–5.3)
Sodium: 138 mmol/L (ref 135–146)
Total Bilirubin: 0.4 mg/dL (ref 0.2–1.2)
Total Protein: 6.8 g/dL (ref 6.1–8.1)
eGFR: 89 mL/min/1.73m2

## 2024-04-25 LAB — CBC
HCT: 38.4 % (ref 35.9–46.0)
Hemoglobin: 12.4 g/dL (ref 11.7–15.5)
MCH: 28.8 pg (ref 27.0–33.0)
MCHC: 32.3 g/dL (ref 31.6–35.4)
MCV: 89.3 fL (ref 81.4–101.7)
MPV: 11.5 fL (ref 7.5–12.5)
Platelets: 313 Thousand/uL (ref 140–400)
RBC: 4.3 Million/uL (ref 3.80–5.10)
RDW: 12.5 % (ref 11.0–15.0)
WBC: 6.5 Thousand/uL (ref 3.8–10.8)

## 2024-04-25 LAB — MICROALBUMIN / CREATININE URINE RATIO
Creatinine, Urine: 111 mg/dL (ref 20–275)
Microalb Creat Ratio: 7 mg/g{creat}
Microalb, Ur: 0.8 mg/dL

## 2024-04-27 ENCOUNTER — Ambulatory Visit: Payer: Self-pay | Admitting: Internal Medicine

## 2024-04-28 NOTE — Telephone Encounter (Signed)
 Requested medication (s) are due for refill today: Yes  Requested medication (s) are on the active medication list: Yes  Last refill:  04/24/24  Future visit scheduled: Yes  Notes to clinic:  See pharmacy request.    Requested Prescriptions  Pending Prescriptions Disp Refills   PARoxetine  (PAXIL -CR) 12.5 MG 24 hr tablet [Pharmacy Med Name: PAROXETINE  ER 12.5 MG TABLET] 90 tablet 1    Sig: TAKE 1 TABLET BY MOUTH EVERY DAY     Psychiatry:  Antidepressants - SSRI Passed - 04/28/2024  1:21 PM      Passed - Completed PHQ-2 or PHQ-9 in the last 360 days      Passed - Valid encounter within last 6 months    Recent Outpatient Visits           4 days ago Encounter for general adult medical examination with abnormal findings   [facility] [place], [place], NP   7 months ago Subcutaneous nodule of abdominal [place]   [facility] [place], [name], [place]

## 2024-04-29 ENCOUNTER — Other Ambulatory Visit: Payer: Self-pay | Admitting: Internal Medicine

## 2024-04-29 ENCOUNTER — Telehealth: Payer: Self-pay | Admitting: Internal Medicine

## 2024-04-29 DIAGNOSIS — E785 Hyperlipidemia, unspecified: Secondary | ICD-10-CM

## 2024-04-29 NOTE — Telephone Encounter (Signed)
 Copied from CRM [id number]. Topic: Clinical - Medication Refill >> Apr 29, 2024  8:17 AM Amy B wrote: Medication: pantoprazole  (PROTONIX ) 40 MG tablet fenofibrate  (TRICOR ) 145 MG tablet  Has the patient contacted their pharmacy? Yes (Agent: If no, request that the patient contact the pharmacy for the refill. If patient does not wish to contact the pharmacy document the reason why and proceed with request.) (Agent: If yes, when and what did the pharmacy advise?)  This is the patient's preferred pharmacy:  [facility] [phone] [facility], [place] - [address] [place] [address] Phone: [phone] Fax: [phone]  Is this the correct pharmacy for this prescription? Yes If no, delete pharmacy and type the correct one.   Has the prescription been filled recently? No  Is the patient out of the medication? Yes  Has the patient been seen for an appointment in the last year OR does the patient have an upcoming appointment? Yes  Can we respond through MyChart? Yes  Agent: Please be advised that Rx refills may take up to 3 business days. We ask that you follow-up with your pharmacy.

## 2024-05-01 MED ORDER — PANTOPRAZOLE SODIUM 40 MG PO TBEC: 40.0000 mg | DELAYED_RELEASE_TABLET | Freq: Two times a day (BID) | ORAL | 0 refills | Status: AC

## 2024-05-01 MED ORDER — FENOFIBRATE 145 MG PO TABS: 145.0000 mg | ORAL_TABLET | Freq: Every day | ORAL | 0 refills | Status: AC

## 2024-05-01 NOTE — Telephone Encounter (Signed)
 Requested Prescriptions  Pending Prescriptions Disp Refills   fenofibrate  (TRICOR ) 145 MG tablet 90 tablet 0    Sig: Take 1 tablet (145 mg total) by mouth daily.     Cardiovascular:  Antilipid - Fibric Acid Derivatives Failed - 05/01/2024 12:27 PM      Failed - ALT in normal range and within 360 days    ALT  Date Value Ref Range Status  04/24/2024 31 (H) 6 - 29 U/L Final         Failed - Lipid Panel in normal range within the last 12 months    Cholesterol  Date Value Ref Range Status  04/24/2024 218 (H) <200 mg/dL Final   LDL Cholesterol (Calc)  Date Value Ref Range Status  04/24/2024 116 (H) mg/dL (calc) Final    Comment:    Reference range: <100 . Desirable range <100 mg/dL for primary prevention;   <70 mg/dL for patients with CHD or diabetic patients  with > or = 2 CHD risk factors. SABRA LDL-C is now calculated using the Martin-Hopkins  calculation, which is a validated novel method providing  better accuracy than the Friedewald equation in the  estimation of LDL-C.  [name] 7986;689(80): 2061-2068  (http://education.[facility].com/faq/FAQ164)    Direct LDL  Date Value Ref Range Status  12/17/2019 63.0 mg/dL Final    Comment:    Optimal:  <100 mg/dLNear or Above Optimal:  100-129 mg/dLBorderline High:  130-159 mg/dLHigh:  160-189 mg/dLVery High:  >190 mg/dL   HDL  Date Value Ref Range Status  04/24/2024 64 > OR = 50 mg/dL Final   Triglycerides  Date Value Ref Range Status  04/24/2024 267 (H) <150 mg/dL Final    Comment:    . If a non-fasting specimen was collected, consider repeat triglyceride testing on a fasting specimen if clinically indicated.  [name];[name]          Passed - AST in normal range and within 360 days    AST  Date Value Ref Range Status  04/24/2024 27 10 - 35 U/L Final         Passed - Cr in normal range and within 360 days    Creat  Date Value Ref Range Status   04/24/2024 0.74 0.50 - 1.05 mg/dL Final   Creatinine, Urine  Date Value Ref Range Status  04/24/2024 111 20 - 275 mg/dL Final         Passed - HGB in normal range and within 360 days    Hemoglobin  Date Value Ref Range Status  04/24/2024 12.4 11.7 - 15.5 g/dL Final         Passed - HCT in normal range and within 360 days    HCT  Date Value Ref Range Status  04/24/2024 38.4 35.9 - 46.0 % Final         Passed - PLT in normal range and within 360 days    Platelets  Date Value Ref Range Status  04/24/2024 313 140 - 400 Thousand/uL Final         Passed - WBC in normal range and within 360 days    WBC  Date Value Ref Range Status  04/24/2024 6.5 3.8 - 10.8 Thousand/uL Final         Passed - eGFR is 30 or above and within 360 days    GFR, Est African American  Date Value Ref Range Status  09/27/2016 87 >=60 mL/min Final  GFR, Est Non African American  Date Value Ref Range Status  09/27/2016 76 >=60 mL/min Final   GFR  Date Value Ref Range Status  12/17/2019 65.14 >60.00 mL/min Final   eGFR  Date Value Ref Range Status  04/24/2024 89 > OR = 60 mL/min/1.41m2 Final         Passed - Valid encounter within last 12 months    Recent Outpatient Visits           1 week ago Encounter for general adult medical examination with abnormal findings   [facility] [place], [place] W, NP   7 months ago Subcutaneous nodule of abdominal [place]   [facility], [name], DO               pantoprazole  (PROTONIX ) 40 MG tablet 180 tablet 0    Sig: Take 1 tablet (40 mg total) by mouth 2 (two) times daily.     Gastroenterology: Proton Pump Inhibitors Passed - 05/01/2024 12:27 PM      Passed - Valid encounter within last 12 months    Recent Outpatient Visits           1 week ago Encounter for general adult medical examination with abnormal findings   [facility] [place], Angeline ORN, NP    7 months ago Subcutaneous nodule of abdominal [place]   [facility] [place], [name], [place]

## 2024-05-26 ENCOUNTER — Other Ambulatory Visit: Payer: Self-pay | Admitting: Internal Medicine

## 2024-05-26 NOTE — Telephone Encounter (Signed)
 Requested Prescriptions  Pending Prescriptions Disp Refills   ezetimibe  (ZETIA ) 10 MG tablet [Pharmacy Med Name: EZETIMIBE  10 MG TABLET] 90 tablet 1    Sig: TAKE 1 TABLET BY MOUTH EVERY DAY     Cardiovascular:  Antilipid - Sterol Transport Inhibitors Failed - 05/26/2024  2:15 PM      Failed - ALT in normal range and within 360 days    ALT  Date Value Ref Range Status  04/24/2024 31 (H) 6 - 29 U/L Final         Failed - Lipid Panel in normal range within the last 12 months    Cholesterol  Date Value Ref Range Status  04/24/2024 218 (H) <200 mg/dL Final   LDL Cholesterol (Calc)  Date Value Ref Range Status  04/24/2024 116 (H) mg/dL (calc) Final    Comment:    Reference range: <100 . Desirable range <100 mg/dL for primary prevention;   <70 mg/dL for patients with CHD or diabetic patients  with > or = 2 CHD risk factors. SABRA LDL-C is now calculated using the Martin-Hopkins  calculation, which is a validated novel method providing  better accuracy than the Friedewald equation in the  estimation of LDL-C.  [name] 7986;689(80): 2061-2068  (http://education.[facility].com/faq/FAQ164)    Direct LDL  Date Value Ref Range Status  12/17/2019 63.0 mg/dL Final    Comment:    Optimal:  <100 mg/dLNear or Above Optimal:  100-129 mg/dLBorderline High:  130-159 mg/dLHigh:  160-189 mg/dLVery High:  >190 mg/dL   HDL  Date Value Ref Range Status  04/24/2024 64 > OR = 50 mg/dL Final   Triglycerides  Date Value Ref Range Status  04/24/2024 267 (H) <150 mg/dL Final    Comment:    . If a non-fasting specimen was collected, consider repeat triglyceride testing on a fasting specimen if clinically indicated.  [name];[name]          Passed - AST in normal range and within 360 days    AST  Date Value Ref Range Status  04/24/2024 27 10 - 35 U/L Final         Passed - Patient is not pregnant      Passed - Valid encounter  within last 12 months    Recent Outpatient Visits           1 month ago Encounter for general adult medical examination with abnormal findings   [facility] [place], [place] W, NP   8 months ago Subcutaneous nodule of abdominal [place]   [facility] [place], [name], [place]

## 2024-06-08 ENCOUNTER — Ambulatory Visit: Payer: Self-pay

## 2024-06-08 NOTE — Telephone Encounter (Signed)
 FYI Only or Action Required?: FYI only for provider: appointment scheduled on 06/09/24.  Patient was last seen in primary care on 04/24/2024 by Antonette Angeline ORN, NP.  Called Nurse Triage reporting Epistaxis and Sinusitis.  Symptoms began a week ago.  Interventions attempted: OTC medications: Ibuprofen.  Symptoms are: unchanged.  Triage Disposition: See Physician Within 24 Hours  Patient/caregiver understands and will follow disposition?: Yes   Message from [place] F sent at 06/08/2024  8:59 AM EST  Reason for Triage: Patient has been sick for over a week - bloody nose, bad cold, sore throat, and headache. Please call [phone]    Reason for Disposition  [1] Sinus pain (not just congestion) AND [2] fever  Answer Assessment - Initial Assessment Questions No available appts with pcp. Scheduled vv appt 06/09/24  Advised call back or ED/911 if symptoms occur/worsen: severe diff breathing, chest pain > 5 min, faint. Patient verbalized understanding.   1. LOCATION: Where does it hurt?      HA, nasal drainage; I feel awful in the mornings 2. ONSET: When did the sinus pain start?  (e.g., hours, days)      Week ago 3. SEVERITY: How bad is the pain?   (Scale 0-10; or none, mild, moderate or severe)     no 5. NASAL CONGESTION: Is the nose blocked? If Yes, ask: Can you open it or must you breathe through your mouth?     yes 6. NASAL DISCHARGE: Do you have discharge from your nose? If so ask, What color?     yellow 7. FEVER: Do you have a fever? If Yes, ask: What is it, how was it measured, and when did it start?      Denies fever chills n/v 8. OTHER SYMPTOMS: Do you have any other symptoms? (e.g., sore throat, cough, earache, difficulty breathing)   HA, denies diff breathing, chest pain, faint  Protocols used: Sinus Pain or Congestion-A-AH

## 2024-06-09 ENCOUNTER — Encounter: Payer: Self-pay | Admitting: Family Medicine

## 2024-06-09 ENCOUNTER — Telehealth: Admitting: Family Medicine

## 2024-06-09 ENCOUNTER — Ambulatory Visit: Admitting: Internal Medicine

## 2024-06-09 NOTE — Progress Notes (Signed)
 Patient unable to connect to video or audio. Offered in person appointment which was declined.

## 2025-04-21 ENCOUNTER — Ambulatory Visit
